# Patient Record
Sex: Female | Born: 1965
Health system: Southern US, Community
[De-identification: ages and names within clinical notes are randomized; demographics above are authoritative.]

## PROBLEM LIST (undated history)

## (undated) DIAGNOSIS — M2550 Pain in unspecified joint: Secondary | ICD-10-CM

## (undated) DIAGNOSIS — I739 Peripheral vascular disease, unspecified: Secondary | ICD-10-CM

## (undated) DIAGNOSIS — E669 Obesity, unspecified: Secondary | ICD-10-CM

## (undated) DIAGNOSIS — I480 Paroxysmal atrial fibrillation: Secondary | ICD-10-CM

## (undated) DIAGNOSIS — R002 Palpitations: Secondary | ICD-10-CM

## (undated) DIAGNOSIS — O24410 Gestational diabetes mellitus in pregnancy, diet controlled: Secondary | ICD-10-CM

## (undated) DIAGNOSIS — I10 Essential (primary) hypertension: Secondary | ICD-10-CM

## (undated) DIAGNOSIS — E559 Vitamin D deficiency, unspecified: Secondary | ICD-10-CM

## (undated) DIAGNOSIS — D649 Anemia, unspecified: Secondary | ICD-10-CM

## (undated) DIAGNOSIS — Z8619 Personal history of other infectious and parasitic diseases: Secondary | ICD-10-CM

## (undated) DIAGNOSIS — M1990 Unspecified osteoarthritis, unspecified site: Secondary | ICD-10-CM

## (undated) DIAGNOSIS — I471 Supraventricular tachycardia, unspecified: Secondary | ICD-10-CM

## (undated) DIAGNOSIS — F329 Major depressive disorder, single episode, unspecified: Secondary | ICD-10-CM

## (undated) DIAGNOSIS — I499 Cardiac arrhythmia, unspecified: Secondary | ICD-10-CM

## (undated) DIAGNOSIS — Z8632 Personal history of gestational diabetes: Secondary | ICD-10-CM

## (undated) DIAGNOSIS — R7303 Prediabetes: Secondary | ICD-10-CM

## (undated) DIAGNOSIS — E785 Hyperlipidemia, unspecified: Secondary | ICD-10-CM

## (undated) DIAGNOSIS — R5383 Other fatigue: Secondary | ICD-10-CM

## (undated) DIAGNOSIS — K219 Gastro-esophageal reflux disease without esophagitis: Secondary | ICD-10-CM

## (undated) DIAGNOSIS — G4733 Obstructive sleep apnea (adult) (pediatric): Secondary | ICD-10-CM

## (undated) DIAGNOSIS — G4730 Sleep apnea, unspecified: Secondary | ICD-10-CM

## (undated) DIAGNOSIS — Z0000 Encounter for general adult medical examination without abnormal findings: Principal | ICD-10-CM

## (undated) DIAGNOSIS — B019 Varicella without complication: Secondary | ICD-10-CM

## (undated) DIAGNOSIS — R011 Cardiac murmur, unspecified: Secondary | ICD-10-CM

## (undated) DIAGNOSIS — N189 Chronic kidney disease, unspecified: Secondary | ICD-10-CM

## (undated) DIAGNOSIS — G709 Myoneural disorder, unspecified: Secondary | ICD-10-CM

## (undated) DIAGNOSIS — I4891 Unspecified atrial fibrillation: Secondary | ICD-10-CM

## (undated) HISTORY — DX: Hyperlipidemia, unspecified: E78.5

## (undated) HISTORY — DX: Cardiac arrhythmia, unspecified: I49.9

## (undated) HISTORY — DX: Palpitations: R00.2

## (undated) HISTORY — DX: Personal history of gestational diabetes: Z86.32

## (undated) HISTORY — DX: Gestational diabetes mellitus in pregnancy, diet controlled: O24.410

## (undated) HISTORY — DX: Obesity, unspecified: E66.9

## (undated) HISTORY — DX: Unspecified osteoarthritis, unspecified site: M19.90

## (undated) HISTORY — DX: Personal history of other infectious and parasitic diseases: Z86.19

## (undated) HISTORY — DX: Cardiac murmur, unspecified: R01.1

## (undated) HISTORY — DX: Prediabetes: R73.03

## (undated) HISTORY — DX: Gastro-esophageal reflux disease without esophagitis: K21.9

## (undated) HISTORY — DX: Major depressive disorder, single episode, unspecified: F32.9

## (undated) HISTORY — DX: Anemia, unspecified: D64.9

## (undated) HISTORY — DX: Encounter for general adult medical examination without abnormal findings: Z00.00

## (undated) HISTORY — DX: Sleep apnea, unspecified: G47.30

## (undated) HISTORY — PX: PALATE / UVULA BIOPSY / EXCISION: SUR128

## (undated) HISTORY — DX: Other fatigue: R53.83

## (undated) HISTORY — DX: Pain in unspecified joint: M25.50

## (undated) HISTORY — DX: Unspecified atrial fibrillation: I48.91

## (undated) HISTORY — PX: ADENOIDECTOMY: SHX5191

## (undated) HISTORY — DX: Vitamin D deficiency, unspecified: E55.9

## (undated) HISTORY — DX: Varicella without complication: B01.9

## (undated) HISTORY — PX: TONSILLECTOMY: SUR1361

## (undated) HISTORY — DX: Obstructive sleep apnea (adult) (pediatric): G47.33

## (undated) HISTORY — DX: Paroxysmal atrial fibrillation: I48.0

## (undated) HISTORY — PX: ENDOVENOUS ABLATION SAPHENOUS VEIN W/ LASER: SUR449

## (undated) HISTORY — PX: WISDOM TOOTH EXTRACTION: SHX21

---

## 1999-07-20 ENCOUNTER — Other Ambulatory Visit: Admission: RE | Admit: 1999-07-20 | Discharge: 1999-07-20 | Payer: Self-pay | Admitting: Family Medicine

## 2000-04-24 ENCOUNTER — Encounter: Admission: RE | Admit: 2000-04-24 | Discharge: 2000-07-23 | Payer: Self-pay | Admitting: Obstetrics & Gynecology

## 2000-06-18 ENCOUNTER — Inpatient Hospital Stay (HOSPITAL_COMMUNITY): Admission: AD | Admit: 2000-06-18 | Discharge: 2000-06-20 | Payer: Self-pay | Admitting: Obstetrics and Gynecology

## 2000-06-18 ENCOUNTER — Encounter (INDEPENDENT_AMBULATORY_CARE_PROVIDER_SITE_OTHER): Payer: Self-pay | Admitting: Specialist

## 2000-07-17 ENCOUNTER — Other Ambulatory Visit: Admission: RE | Admit: 2000-07-17 | Discharge: 2000-07-17 | Payer: Self-pay | Admitting: Obstetrics and Gynecology

## 2004-08-03 ENCOUNTER — Emergency Department (HOSPITAL_COMMUNITY): Admission: EM | Admit: 2004-08-03 | Discharge: 2004-08-03 | Payer: Self-pay | Admitting: Family Medicine

## 2004-10-04 ENCOUNTER — Ambulatory Visit (HOSPITAL_BASED_OUTPATIENT_CLINIC_OR_DEPARTMENT_OTHER): Admission: RE | Admit: 2004-10-04 | Discharge: 2004-10-04 | Payer: Self-pay | Admitting: Internal Medicine

## 2006-05-06 ENCOUNTER — Emergency Department (HOSPITAL_COMMUNITY): Admission: EM | Admit: 2006-05-06 | Discharge: 2006-05-06 | Payer: Self-pay | Admitting: Emergency Medicine

## 2006-05-22 ENCOUNTER — Ambulatory Visit: Payer: Self-pay | Admitting: Internal Medicine

## 2006-10-28 ENCOUNTER — Emergency Department (HOSPITAL_COMMUNITY): Admission: EM | Admit: 2006-10-28 | Discharge: 2006-10-28 | Payer: Self-pay | Admitting: Family Medicine

## 2008-09-07 ENCOUNTER — Emergency Department (HOSPITAL_COMMUNITY): Admission: EM | Admit: 2008-09-07 | Discharge: 2008-09-07 | Payer: Self-pay | Admitting: Emergency Medicine

## 2008-09-21 ENCOUNTER — Ambulatory Visit: Payer: Self-pay | Admitting: Internal Medicine

## 2009-07-19 ENCOUNTER — Telehealth: Payer: Self-pay | Admitting: Internal Medicine

## 2009-10-12 ENCOUNTER — Telehealth: Payer: Self-pay | Admitting: Internal Medicine

## 2009-12-01 ENCOUNTER — Ambulatory Visit (HOSPITAL_COMMUNITY): Admission: RE | Admit: 2009-12-01 | Discharge: 2009-12-01 | Payer: Self-pay | Admitting: General Surgery

## 2009-12-22 ENCOUNTER — Encounter (INDEPENDENT_AMBULATORY_CARE_PROVIDER_SITE_OTHER): Payer: Self-pay | Admitting: Otolaryngology

## 2009-12-22 ENCOUNTER — Ambulatory Visit (HOSPITAL_COMMUNITY): Admission: RE | Admit: 2009-12-22 | Discharge: 2009-12-23 | Payer: Self-pay | Admitting: Otolaryngology

## 2010-09-08 ENCOUNTER — Ambulatory Visit: Payer: Self-pay | Admitting: Diagnostic Radiology

## 2010-09-08 ENCOUNTER — Emergency Department (HOSPITAL_BASED_OUTPATIENT_CLINIC_OR_DEPARTMENT_OTHER): Admission: EM | Admit: 2010-09-08 | Discharge: 2010-09-08 | Payer: Self-pay | Admitting: Emergency Medicine

## 2011-02-05 LAB — URINALYSIS, ROUTINE W REFLEX MICROSCOPIC
Bilirubin Urine: NEGATIVE
Ketones, ur: NEGATIVE mg/dL
Nitrite: NEGATIVE
Protein, ur: NEGATIVE mg/dL
Specific Gravity, Urine: 1.015 (ref 1.005–1.030)
Urobilinogen, UA: 1 mg/dL (ref 0.0–1.0)

## 2011-02-05 LAB — CBC
HCT: 33.8 % — ABNORMAL LOW (ref 36.0–46.0)
Hemoglobin: 11.3 g/dL — ABNORMAL LOW (ref 12.0–15.0)
Hemoglobin: 11.6 g/dL — ABNORMAL LOW (ref 12.0–15.0)
MCHC: 33.5 g/dL (ref 30.0–36.0)
MCV: 87.4 fL (ref 78.0–100.0)
MCV: 88.9 fL (ref 78.0–100.0)
Platelets: 294 10*3/uL (ref 150–400)
RBC: 3.92 MIL/uL (ref 3.87–5.11)
RDW: 13.5 % (ref 11.5–15.5)
WBC: 9.2 10*3/uL (ref 4.0–10.5)

## 2011-02-05 LAB — DIFFERENTIAL
Basophils Absolute: 0 10*3/uL (ref 0.0–0.1)
Basophils Relative: 1 % (ref 0–1)
Eosinophils Absolute: 0.2 10*3/uL (ref 0.0–0.7)
Eosinophils Relative: 3 % (ref 0–5)
Lymphocytes Relative: 29 % (ref 12–46)
Monocytes Absolute: 0.5 10*3/uL (ref 0.1–1.0)

## 2011-02-05 LAB — BASIC METABOLIC PANEL
BUN: 12 mg/dL (ref 6–23)
CO2: 31 mEq/L (ref 19–32)
CO2: 27 mEq/L (ref 19–32)
Calcium: 9.1 mg/dL (ref 8.4–10.5)
Chloride: 105 mEq/L (ref 96–112)
Creatinine, Ser: 0.66 mg/dL (ref 0.4–1.2)
GFR calc Af Amer: >60 mL/min (ref >60)
GFR calc non Af Amer: >60 mL/min (ref >60)
Glucose, Bld: 94 mg/dL (ref 70–99)
Potassium: 3.3 mEq/L — ABNORMAL LOW (ref 3.5–5.1)
Sodium: 139 mEq/L (ref 135–145)
Sodium: 138 mEq/L (ref 135–145)

## 2011-02-05 LAB — URINE MICROSCOPIC-ADD ON

## 2011-02-05 LAB — HCG, SERUM, QUALITATIVE: Preg, Serum: NEGATIVE

## 2011-02-08 LAB — MRSA PCR SCREENING: MRSA by PCR: NEGATIVE

## 2011-04-04 NOTE — Letter (Signed)
September 21, 2008    Theressa Millard, M.D.  [address] [place], [place] [address]   RE:  KINSHASA, THROCKMORTON  MRN:  [id number]  /  DOB:  03-31-1966   Dear Fayrene Fearing,   It was a pleasure seeing Sammie Schermerhorn again for her SVT.  She had been  quiescent since we had seen her couple years ago until August.  Since  that time, she said 3 episodes of tachycardia.  The first broke  spontaneously, the second had required intravenous diltiazem.  These  episodes are fog positive but diuretic negative.   She is also concerned about her blood pressure; you recently increased  her Diovan HCT.   She does have a history as you know of obstructive sleep apnea, for  which she uses a CPAP and has not been adjusted that she recalls in last  3 years or so.   MEDICATIONS:  Currently include  Diovan HCT 320/12.5 as well Celexa 40.   PHYSICAL EXAMINATION:  Her blood pressure was 138/84.  Her pulse was 72,  her weight was 249, which is up 10 pounds in the last 2 years.  Her  lungs were clear.  Her heart sounds were regular.  Extremities had no  edema.   Electrocardiogram dated today demonstrated sinus rhythm at 85 with  intervals of 0.16/0.09/0.39, the axis was 24 degrees.   Electrocardiogram of her tachycardia obtained September 07, 2008  demonstrated a narrow QRS tachycardia with an R prime in lead V1.  The  cycle length was 320 milliseconds or so.   IMPRESSION:  1. Recurrent supraventricular tachycardia, probably atrioventricular      nodal reentry.  2. Hypertension.  3. Obstructive sleep apnea.  4. Obesity.   Ms. Khurana has continued to have episodes of SVT at an increasing  frequency.  We discussed treatment options again including catheter  ablation, to which she is averse because of concerns about heart block  and pacemaker implantation.  She is also concerned about her blood  pressure in the context of her weight.  We discussed potential  contributions of her sleep apnea to her  hypertension.  She mentioned  that had been some years since it had been titrated.  I told her, I  would discuss with you about getting her referred perhaps back to Dr.  Jetty Duhamel for reassessment of the appropriate pressures settings.  In addition, because of the increasing frequency of her SVT, I have  given her prescriptions for 4 different medications to see, which of  these she tolerate the best, if any, these include diltiazem 120,  verapamil 120, atenolol 50, and Inderal LA 60.  She is to take them in  random order and let us know, which of them was the best tolerated and  we will maintain that and see if it has an impact on decreasing the  frequency of her SVT as well as improving her blood pressure control.   Thanks very much for allowing [place] to participate in her care.  If there  is anything further I can do, please do not hesitate to contact me.    Sincerely,      Duke Salvia, MD, [facility]  Electronically Signed    SCK/MedQ  DD: 09/21/2008  DT: 09/22/2008  Job #: [id number]

## 2011-04-07 NOTE — Procedures (Signed)
Misty Mahoney, MARION NO.:  [id number]   MEDICAL RECORD NO.:  [id number]          PATIENT TYPE:  OUT   LOCATION:  SLEEP CENTER                 FACILITY[facility]  [facility]   PHYSICIAN:  Clinton D. Maple Hudson, M.D. DATE OF BIRTH:  [birth year]   DATE OF STUDY:  10/04/2004                              NOCTURNAL POLYSOMNOGRAM   REFERRING PHYSICIAN:  Theressa Millard, MD   INDICATION FOR STUDY:  Hypersomnia with sleep apnea.  Epworth Sleepiness  Score 6/24, BMI 36, weight 238 pounds.   SLEEP ARCHITECTURE:  Total sleep time 379 minutes with sleep efficiency 83%.  Stage 1 was 6%, stage 2 66%, stages 3 and 4 5%.  REM was 24% of total sleep  time.  Sleep efficiency 83%, sleep latency 30 minutes, REM latency 95  minutes, awake after sleep onset of 49 minutes, arousal index elevated at  93.   RESPIRATORY DATA:  Split study protocol.  RDI 21.4 per hour indicating  moderate obstructive sleep apnea/hypopnea syndrome before CPAP.  This  included 1 obstructive apnea and 51 hypopneas before CPAP.  Events  were  clearly positional, limited to supine sleeping position.  REM RDI 22 per  hour.  CPAP was titrated to 10 CWP, RDI 0 per hour.  A small Respironics  comfort gel mask was used with heated humidifier.   OXYGEN DATA:  Very loud snoring with oxygen desaturation to a nadir of 88%  before CPAP.  After CPAP titration, saturation held 96% on room air.   CARDIAC DATA:  Normal sinus rhythm.   MOVEMENT/PARASOMNIA:  A total of 34 limb jerks were recorded of which 16  were associated with arousal or awakening, for a periodic limb movement with  arousal index of 2.5 per hour, which is mildly increased.   IMPRESSION/RECOMMENDATION:  Moderate obstructive sleep apnea/hypopnea  syndrome, RDI 21.4 per hour with desaturation to 88%.  CPAP titration to 10  CWP, RDI 0 per hour using a small Respironics comfort gel mask with heated  humidifier.     Clinton D. Maple Hudson, M.D.  Diplomate, American Board   CDY/MEDQ  D:  10/09/2004 12:15:06  T:  10/09/2004 17:01:56  Job:  [id number]

## 2011-08-22 LAB — POCT CARDIAC MARKERS
CKMB, poc: 1.6
Myoglobin, poc: 61.8

## 2011-08-22 LAB — POCT I-STAT, CHEM 8
BUN: 15
Chloride: 107
Creatinine, Ser: 0.7
Sodium: 139

## 2011-11-07 ENCOUNTER — Other Ambulatory Visit: Payer: Self-pay

## 2011-11-21 HISTORY — PX: CARDIAC ELECTROPHYSIOLOGY STUDY AND ABLATION: SHX1294

## 2011-11-21 LAB — HM PAP SMEAR

## 2011-11-21 LAB — HM MAMMOGRAPHY

## 2012-03-25 ENCOUNTER — Emergency Department (HOSPITAL_COMMUNITY): Payer: 59

## 2012-03-25 ENCOUNTER — Emergency Department (HOSPITAL_COMMUNITY)
Admission: EM | Admit: 2012-03-25 | Discharge: 2012-03-25 | Disposition: A | Discharge status: Home / Self Care | Payer: 59 | Attending: Emergency Medicine | Admitting: Emergency Medicine

## 2012-03-25 ENCOUNTER — Encounter (HOSPITAL_COMMUNITY): Payer: Self-pay | Admitting: Cardiology

## 2012-03-25 DIAGNOSIS — I472 Ventricular tachycardia: Secondary | ICD-10-CM

## 2012-03-25 DIAGNOSIS — I498 Other specified cardiac arrhythmias: Secondary | ICD-10-CM | POA: Insufficient documentation

## 2012-03-25 DIAGNOSIS — R0602 Shortness of breath: Secondary | ICD-10-CM | POA: Insufficient documentation

## 2012-03-25 DIAGNOSIS — I10 Essential (primary) hypertension: Secondary | ICD-10-CM | POA: Insufficient documentation

## 2012-03-25 DIAGNOSIS — I471 Supraventricular tachycardia: Secondary | ICD-10-CM

## 2012-03-25 DIAGNOSIS — Z79899 Other long term (current) drug therapy: Secondary | ICD-10-CM | POA: Insufficient documentation

## 2012-03-25 HISTORY — DX: Supraventricular tachycardia: I47.1

## 2012-03-25 HISTORY — DX: Essential (primary) hypertension: I10

## 2012-03-25 HISTORY — DX: Supraventricular tachycardia, unspecified: I47.10

## 2012-03-25 LAB — POCT I-STAT, CHEM 8
Chloride: 106 mEq/L (ref 96–112)
HCT: 38 % (ref 36.0–46.0)
Hemoglobin: 12.9 g/dL (ref 12.0–15.0)
Potassium: 3.5 mEq/L (ref 3.5–5.1)
Sodium: 142 mEq/L (ref 135–145)

## 2012-03-25 LAB — POCT I-STAT TROPONIN I: Troponin i, poc: 0.01 ng/mL (ref 0.00–0.08)

## 2012-03-25 MED ORDER — ADENOSINE 6 MG/2ML IV SOLN
INTRAVENOUS | Status: AC
  Filled 2012-03-25: qty 6

## 2012-03-25 NOTE — ED Provider Notes (Signed)
  Patient has been evaluated by cardiology and deemed stable for discharge. Will f/u as outpatient. They have given her new prescriptions and she is comfortable with this plan. See their note for full details.  Forbes Cellar, MD 03/25/12 [phone]

## 2012-03-25 NOTE — ED Notes (Signed)
Pt placed on cardiac monitor and on zole pads at this time. Denies any pain at this time. No distress noted.

## 2012-03-25 NOTE — ED Notes (Signed)
Pt reports that she has a hx of SVT and normally converted with 10mg  of cardizem. Reports that she has ben having symptoms all weekend. Reports that she is not having chest pain at this time or SOB. No distress noted. Reports that she sees Dr. Alberteen Spindle for cardiology.

## 2012-03-25 NOTE — ED Provider Notes (Signed)
History     CSN: [id number]  Arrival date & time 03/25/12  1324   First MD Initiated Contact with Patient 03/25/12 1340      Chief Complaint  Patient presents with  . SVT      HPI Pt reports that she has a hx of SVT and normally converted with 10mg  of cardizem. Reports that she has ben having symptoms all weekend. Reports that she is not having chest pain at this time or SOB. No distress noted. Reports that she sees Dr. Tollie Pizza for cardiology.   Past Medical History  Diagnosis Date  . SVT (supraventricular tachycardia)   . Hypertension     History reviewed. No pertinent past surgical history.  History reviewed. No pertinent family history.  History  Substance Use Topics  . Smoking status: Not on file  . Smokeless tobacco: Not on file  . Alcohol Use:     OB History    Grav Para Term Preterm Abortions TAB SAB Ect Mult Living                  Review of Systems  All other systems reviewed and are negative.    Allergies  Review of patient's allergies indicates no known allergies.  Home Medications   Current Outpatient Rx  Name Route Sig Dispense Refill  . ACETAMINOPHEN 325 MG PO TABS Oral Take 650 mg by mouth every 6 (six) hours as needed. For pain    . DESVENLAFAXINE SUCCINATE ER 50 MG PO TB24 Oral Take 25 mg by mouth daily.    Marland Kitchen DILTIAZEM HCL ER COATED BEADS 120 MG PO CP24 Oral Take 120 mg by mouth daily.    . IBUPROFEN 200 MG PO TABS Oral Take 200 mg by mouth every 6 (six) hours as needed. For pain    . VALSARTAN-HYDROCHLOROTHIAZIDE 160-12.5 MG PO TABS Oral Take 1 tablet by mouth daily.      BP 154/85  Pulse 103  Temp(Src) 98 F (36.7 C) (Oral)  Resp 15  SpO2 100%  Physical Exam  Nursing note and vitals reviewed. Constitutional: She is oriented to person, place, and time. She appears well-developed and well-nourished. No distress.  HENT:  Head: Normocephalic and atraumatic.  Eyes: Pupils are equal, round, and reactive to light.  Neck: Normal range  of motion.  Cardiovascular: Normal rate and intact distal pulses.        Initial EKG showed what appeared to be an SVT with a rate of around, 160  Subsequent 12-lead showed a sinus tach at rate 1:30 otherwise unremarkable and the latest 12-lead looks like sinus rhythm with rates dropping down to 86 unremarkable otherwise.  Pulmonary/Chest: No respiratory distress.  Abdominal: Normal appearance. She exhibits no distension. There is no tenderness. There is no rebound.  Musculoskeletal: Normal range of motion.  Neurological: She is alert and oriented to person, place, and time. No cranial nerve deficit.  Skin: Skin is warm and dry. No rash noted.  Psychiatric: She has a normal mood and affect. Her behavior is normal.    ED Course  Procedures (including critical care time)   I did speak with cardiology who will evaluate the patient in the emergency department to make recommendations.    Labs Reviewed  POCT I-STAT, CHEM 8 - Abnormal; Notable for the following:    Glucose, Bld 111 (*)    All other components within normal limits  POCT I-STAT TROPONIN I  LAB REPORT - SCANNED   Dg Chest Portable 1 View  03/25/2012  *RADIOLOGY REPORT*  Clinical Data: Occasions.  Short of breath.  Chest pain.  PORTABLE CHEST - 1 VIEW  Comparison: 11/29/2009.  Findings:  Cardiopericardial silhouette within normal limits. Mediastinal contours normal. Trachea midline.  No airspace disease or effusion. Apical lordotic projection. Monitoring leads are projected over the chest.  IMPRESSION: No active cardiopulmonary disease.  Original Report Authenticated By: Andreas Newport, M.D.     1. SVT (supraventricular tachycardia)               Nelia Shi, MD 03/27/12 [phone]

## 2012-03-25 NOTE — Consult Note (Signed)
ELECTROPHYSIOLOGY CONSULT NOTE  Patient ID: Misty Mahoney MRN: [id number], DOB/AGE: 1965-12-21   Admit date: 03/25/2012 Date of Consult: 03/25/2012  Primary Cardiologist: Dr. Sherryl Manges Reason for Consultation: SVT  History of Present Illness Misty Mahoney is a pleasant 46 year old woman with a history of PSVT, diagnosed 5 years ago. She has done well on medical therapy and takes diltiazem once daily. She states she has approximately one episode per month which is usually brief and well tolerated. She is usually able to terminate the rhythm on her own with Valsalva manuvers. She reports increased emotional stress recently and was out of town at [employer] with friends this past weekend. She reports her diet has "not been that great" and her caffeine intake increased over the weekend. She reports increasing palpitations x 1 week which she describes as "very fast and occasionally irregular" with no other accompanying symptoms. She denies chest pain or shortness of breath. She has never experienced syncope. However, today she experienced near-syncope which is new for her, prompting her to seek medical attention. Upon arrival in the ER, she was found to have a narrow complex tachycardia at 218 bpm, most consistent with SVT. She remained hemodynamically stable. She converted to NSR without any intervention while in the ER. Currently, she feels like her usual self and has no complaints.   Past Medical History  Diagnosis Date  . SVT (supraventricular tachycardia)   . Hypertension     Past Surgical History None   Allergies/Intolerances No Known Allergies  Home Medications Medication List  As of 03/25/2012  3:25 PM     acetaminophen 325 MG tablet   Commonly known as: TYLENOL   Take 650 mg by mouth every 6 (six) hours as needed. For pain      desvenlafaxine 50 MG 24 hr tablet   Commonly known as: PRISTIQ   Take 25 mg by mouth daily.      diltiazem 120 MG 24 hr capsule   Commonly known as:  CARDIZEM CD   Take 120 mg by mouth daily.      ibuprofen 200 MG tablet   Commonly known as: ADVIL,MOTRIN   Take 200 mg by mouth every 6 (six) hours as needed. For pain      valsartan-hydrochlorothiazide 160-12.5 MG per tablet   Commonly known as: DIOVAN-HCT   Take 1 tablet by mouth daily.      Family History Sister with SVT and AF; both parents with HTN  Social History Social History  . Marital Status: Legally Separated    Spouse Name: N/A    Number of Children: N/A  . Years of Education: N/A   Occupational History  . Nurse, works for [employer] History Main Topics  . Smoking status: Not a smoker  . Smokeless tobacco: No  . Alcohol Use: Rarely  . Drug Use: Denies   Review of Systems General:  No chills, fever, night sweats or weight changes.  Cardiovascular:  No chest pain, dyspnea on exertion, edema, orthopnea, palpitations, paroxysmal nocturnal dyspnea. Dermatological: No rash, lesions/masses Respiratory: No cough, dyspnea Urologic: No hematuria, dysuria Abdominal:   No nausea, vomiting, diarrhea, bright red blood per rectum, melena, or hematemesis Neurologic:  No visual changes, wkns, changes in mental status. All other systems reviewed and are otherwise negative except as noted above.  Physical Exam Blood pressure 154/85, pulse 103, temperature 98 F (36.7 C), temperature source Oral, resp. rate 15, SpO2 100.00%.  General: Well developed, well appearing 45  year old female, in no acute distress. HEENT: Normocephalic, atraumatic. EOMs intact. Sclera nonicteric. Oropharynx clear.  Neck: Supple without bruits. No JVD. Lungs:  Respirations regular and unlabored, CTA bilaterally. No wheezes, rales or rhonchi. Heart: RRR. S1, S2 present. No murmurs, rub, S3 or S4. Abdomen: Soft, non-tender, non-distended. BS present x 4 quadrants. No hepatosplenomegaly.  Extremities: No clubbing, cyanosis or edema. DP/PT/Radials 2+ and equal bilaterally. Psych: Normal  affect. Neuro: Alert and oriented X 3. Moves all extremities spontaneously.   Labs Lab Results  Component Value Date   WBC 9.2 12/20/2009   HGB 12.9 03/25/2012   HCT 38.0 03/25/2012   MCV 88.9 12/20/2009   PLT 279 12/20/2009     Lab 03/25/12 1403  NA 142  K 3.5  CL 106  CO2 --  BUN 17  CREATININE 0.60  CALCIUM --  PROT --  BILITOT --  ALKPHOS --  ALT --  AST --  GLUCOSE 111*   No components found with this basename: MAGNESIUM No components found with this basename: POCBNP:3 No results found for this basename: TSH,T4TOTAL,FREET3,T3FREE,THYROIDAB in the last 72 hours   Radiology/Studies Dg Chest Portable 1 View  03/25/2012  *RADIOLOGY REPORT*  Clinical Data: Occasions.  Short of breath.  Chest pain.  PORTABLE CHEST - 1 VIEW  Comparison: 11/29/2009.  Findings:  Cardiopericardial silhouette within normal limits. Mediastinal contours normal. Trachea midline.  No airspace disease or effusion. Apical lordotic projection. Monitoring leads are projected over the chest.  IMPRESSION: No active cardiopulmonary disease.  Original Report Authenticated By: Andreas Newport, M.D.   12-lead ECG  1st - on arrival - shows narrow complex tachycardia at 218 bpm, most consistent with SVT, probably AVNRT 2nd - post conversion to SR - Sinus tachycardia at 130 bpm with normal intervals and no ST-T wave abnormalities; no evidence of pre-excitation or Delta wave   Assessment and Plan 1.  PSVT, probable AVNRT - patient is hemodynamically stable and has converted to NSR without any intervention;    Signed, EDMISTEN, BROOKE O., PA-C 03/25/2012, 3:16 PM   Patient seen, examined. Available data reviewed. Agree with findings, assessment, and plan as outlined by Rick Duff, PA-C. EKG tracings reviewed - agree this looks like AVNRT. She is back in sinus. She has no major comorbid conditions, and agree that increased caffeine, poor sleep, have contributed to worsening symptoms. Will increase cardizem CD to  180 mg daily and give Cardizem 60 mg short-acting prep for prn use. Otherwise advised to push fluids, reduce caffeine. Follow-up with Dr Graciela Husbands as an outpatient (this will be arranged).  Tonny Bollman, M.D. 03/25/2012 4:27 PM

## 2012-03-25 NOTE — Discharge Instructions (Signed)
Supraventricular Tachycardia Supraventricular tachycardia (SVT) is an abnormal heart rhythm (arrhythmia) that causes the heart to beat very fast (tachycardia). This kind of fast heartbeat originates in the upper chambers of the heart (atria). SVT can cause the heart to beat greater than 100 beats per minute. SVT can have a rapid burst of heartbeats. This can start and stop suddenly without warning and is called nonsustained. SVT can also be sustained, in which the heart beats at a continuous fast rate.  CAUSES  There can be different causes of SVT. Some of these include:  Heart valve problems such as mitral valve prolapse.   An enlarged heart (hypertrophic cardiomyopathy).   Congenital heart problems.   Heart inflammation (pericarditis).   Hyperthyroidism.   Low potassium or magnesium levels.   Caffeine.   Drug use such as cocaine, methamphetamines, or stimulants.   Some over-the-counter medicines such as:   Decongestants.   Diet medicines.   Herbal medicines.  SYMPTOMS  Symptoms of SVT can vary. Symptoms depend on whether the SVT is sustained or nonsustained. You may experience:  No symptoms (asymptomatic).   An awareness of your heart beating rapidly (palpitations).   Shortness of breath.   Chest pain or pressure.  If your blood pressure drops because of the SVT, you may experience:  Fainting or near fainting.   Weakness.   Dizziness.  DIAGNOSIS  Different tests can be performed to diagnose SVT, such as:  An electrocardiogram (EKG). This is a painless test that records the electrical activity of your heart.   Holter monitor. This is a 24 hour recording of your heart rhythm. You will be given a diary. Write down all symptoms that you have and what you were doing at the time you experienced symptoms.   Arrhythmia monitor. This is a small device that your wear for several weeks. It records the heart rhythm when you have symptoms.   Echocardiogram. This is an  imaging test to help detect abnormal heart structure such as congenital abnormalities, heart valve problems, or heart enlargement.   Stress test. This test can help determine if the SVT is related to exercise.   Electrophysiology study (EPS). This is a procedure that evaluates your heart's electrical system and can help your caregiver find the cause of your SVT.  TREATMENT  Treatment of SVT depends on the symptoms, how often it recurs, and whether there are any underlying heart problems.   If symptoms are rare and no other cardiac disease is present, no treatment may be needed.   Blood work may be done to check potassium, magnesium, and thyroid hormone levels to see if they are abnormal. If these levels are abnormal, treatment to correct the problems will occur.  Medicines Your caregiver may use oral medicines to treat SVT. These medicines are given for long-term control of SVT. Medicines may be used alone or in combination with other treatments. These medicines work to slow nerve impulses in the heart muscle. These medicines can also be used to treat high blood pressure. Some of these medicines may include:  Calcium channel blockers.   Beta blockers.   Digoxin.  Nonsurgical procedures Nonsurgical techniques may be used if oral medicines do not work. Some examples include:  Cardioversion. This technique uses either drugs or an electrical shock to restore a normal heart rhythm.   Cardioversion drugs may be given through an intravenous (IV) line to help "reset" the heart rhythm.   In electrical cardioversion, the caregiver shocks your heart to stop its   beat for a split second. This helps to reset the heart to a normal rhythm.   Ablation. This procedure is done under mild sedation. High frequency radio wave energy is used to destroy the area of heart tissue responsible for the SVT.  HOME CARE INSTRUCTIONS   Do not smoke.   Only take medicines prescribed by your caregiver. Check with  your caregiver before using over-the-counter medicines.   Check with your caregiver about how much alcohol and caffeine (coffee, tea, colas, or chocolate) you may have.   It is very important to keep all follow-up referrals and appointments in order to properly manage this problem.  SEEK IMMEDIATE MEDICAL CARE IF:  You have dizziness.   You faint or nearly faint.   You have shortness of breath.   You have chest pain or pressure.   You have sudden nausea or vomiting.   You have profuse sweating.   You are concerned about how long your symptoms last.   You are concerned about the frequency of your SVT episodes.  If you have the above symptoms, call your local emergency services (911 in [place] immediately. Do not drive yourself to the hospital. MAKE SURE YOU:   Understand these instructions.   Will watch your condition.   Will get help right away if you are not doing well or get worse.  Document Released: 11/06/2005 Document Revised: 10/26/2011 Document Reviewed: 02/18/2009 ExitCare Patient Information 2012 [employer].  RESOURCE GUIDE  Dental Problems  Patients with Medicaid: [facility]                     [facility] [address]                                           [address] Phone:  [phone]                                                   Phone:  [phone]  If unable to pay or uninsured, contact:  [facility] or [facility] to become qualified for the adult dental clinic.  Chronic Pain Problems Contact [facility]  [phone] Patients need to be referred by their primary care doctor.  Insufficient Money for Medicine Contact United Way:  call "211" or [facility] 271-5999.  No Primary Care Doctor Call [facility]  832-8000 Other agencies that provide inexpensive medical care    [facility]  832-8035    [facility]  832-7272    [facility]  [phone]    [facility] Clinic  [phone]    [facility]  [phone]    [facility]  272-1050  Psychological Services [facility]  832-9600 [facility]  [phone] [facility]   800 853-5163 (emergency services 641-4993)  Abuse/Neglect [place] Child Abuse Hotline (336) 641-3795 [place] [facility] Abuse Hotline 800-378-5315 (After Hours)  Emergency Shelter  [facility] (336) 271-5985  [facility] (336) 275-9566 [facility] (704) 372-4663  MRSA Hotline #:   832-7006    [place] [facility]  [facility] [place]   [facility]  [employer]                           [facility] [address] [place]                     [address]         [address]  [place]                                               [place]                              [place] Phone:  [phone]                                  Phone:  [phone]                   Phone:  [phone]  [facility] Phone:  [phone]  [place] Child Abuse Hotline (336) 342-1394 (336) 342-3537 (After Hours)  

## 2012-04-09 ENCOUNTER — Telehealth: Payer: Self-pay | Admitting: *Deleted

## 2012-04-09 NOTE — Telephone Encounter (Signed)
Lorne Skeens C More Detail >>      Sherrilyn Rist        Sent: Thu Apr 04, 2012  1:32 PM    To: Jefferey Pica, RN        CECIA EGGE    MRN: [id number] DOB: 09-Apr-1966     Pt Work: [phone] Pt Home: [phone]           Message     H, call patient this pm, patient states that she will call me back due to driving. Thanks gesila   ----- Message -----    From: Jefferey Pica, RN    Sent: 03/28/2012   5:25 PM      To: Helyn App,  I think you have a copy of this message, but can you call the patient to set her up with Dr. Graciela Husbands. I didn't look to see when she actually saw him last. ----- Message -----    From: Deliah Boston, RN    Sent: 03/27/2012   4:03 PM      To: Jefferey Pica, RN    ----- Message -----    From: Tonny Bollman, MD    Sent: 03/25/2012   4:25 PM      To: Sissy Hoff, RN  Saw this patient with Nehemiah Settle in the ER today with SVT. She sees Berton Mount but hasn't seen in a few yrs. Can you set her up for follow-up? thx       Forwarded by:     Sherrilyn Rist  Date:  04/04/2012

## 2012-04-09 NOTE — Telephone Encounter (Signed)
Misty Mahoney,call patient today, unable to speak with patient. gesila  ----- Message ----- From: Jefferey Pica, RN Sent: 03/29/2012 3:37 PM To: Sherrilyn Rist  Can you please call this patient and set her up for EPH f/u on 5/30 with Dr. Graciela Husbands.  Thanks ----- Message ----- From: Deliah Boston, RN Sent: 03/27/2012 4:03 PM To: Jefferey Pica, RN    ----- Message ----- From: Tonny Bollman, MD Sent: 03/25/2012 4:25 PM To: Sissy Hoff, RN  Saw this patient with Nehemiah Settle in the ER today with SVT. She sees Berton Mount but hasn't seen in a few yrs. Can you set her up for follow-up? thx            Forwarded by:     Sherrilyn Rist Date: 04/05/2012

## 2012-04-12 ENCOUNTER — Telehealth: Payer: Self-pay | Admitting: Internal Medicine

## 2012-04-12 NOTE — Telephone Encounter (Signed)
New problem:  Scheduler - Marlowe Kays, has made several attempts to reach patient by number provide in her demographics to make an appt to see Dr. Graciela Husbands on 5/30. Message sent to epic to [place] on  5/1. Marlowe Kays did speak to Ms. Corine Shelter regarding her appt unfortunately patient was driving and states she will call the office back to make her an appt . Marlowe Kays call patient today, left message on home voice mail to call office to make appt with Dr. Graciela Husbands on 5/30.

## 2012-05-06 ENCOUNTER — Encounter: Payer: Self-pay | Admitting: Internal Medicine

## 2012-07-18 ENCOUNTER — Encounter: Payer: Self-pay | Admitting: Internal Medicine

## 2012-07-18 ENCOUNTER — Ambulatory Visit (INDEPENDENT_AMBULATORY_CARE_PROVIDER_SITE_OTHER): Payer: 59 | Admitting: Internal Medicine

## 2012-07-18 VITALS — BP 176/89 | HR 88 | Ht 68.0 in | Wt 240.8 lb

## 2012-07-18 DIAGNOSIS — I10 Essential (primary) hypertension: Secondary | ICD-10-CM | POA: Insufficient documentation

## 2012-07-18 DIAGNOSIS — G4730 Sleep apnea, unspecified: Secondary | ICD-10-CM

## 2012-07-18 DIAGNOSIS — I498 Other specified cardiac arrhythmias: Secondary | ICD-10-CM

## 2012-07-18 DIAGNOSIS — R0989 Other specified symptoms and signs involving the circulatory and respiratory systems: Secondary | ICD-10-CM

## 2012-07-18 DIAGNOSIS — I471 Supraventricular tachycardia: Secondary | ICD-10-CM

## 2012-07-18 DIAGNOSIS — G4733 Obstructive sleep apnea (adult) (pediatric): Secondary | ICD-10-CM

## 2012-07-18 DIAGNOSIS — I4891 Unspecified atrial fibrillation: Secondary | ICD-10-CM

## 2012-07-18 MED ORDER — METOPROLOL SUCCINATE ER 25 MG PO TB24: 25.0000 mg | ORAL_TABLET | Freq: Every day | ORAL | Status: DC

## 2012-07-18 MED ORDER — ATENOLOL 25 MG PO TABS: 25.0000 mg | ORAL_TABLET | Freq: Two times a day (BID) | ORAL | Status: DC

## 2012-07-18 NOTE — Patient Instructions (Signed)
Your physician has recommended you make the following change in your medication:  - you are being given prescriptions for 2 different beta blockers to try. You may take them in any order. Do not take more than one at a time. Dr. Graciela Husbands would like you to try each one for a least 2 weeks to see how you tolerate them 1) Atenolol 25 mg one tablet by mouth once daily. 2) Metoprolol succinate 25 mg one tablet by mouth once daily.  Your physician has recommended you have a home sleep study ([facility]).  Your physician has requested that you have an echocardiogram. Echocardiography is a painless test that uses sound waves to create images of your heart. It provides your doctor with information about the size and shape of your heart and how well your heart's chambers and valves are working. This procedure takes approximately one hour. There are no restrictions for this procedure.  Your physician has recommended that you have a SVT ablation. We will arrange for this to be done with Dr. Lewayne Bunting after your testing is completed. Dr. Odessa Fleming nurse, Herbert Seta, or Dr. Lubertha Basque nurse, Tresa Endo, will be in touch with you to set this up. Catheter ablation is a medical procedure used to treat some cardiac arrhythmias (irregular heartbeats). During catheter ablation, a long, thin, flexible tube is put into a blood vessel in your groin (upper thigh), or neck. This tube is called an ablation catheter. It is then guided to your heart through the blood vessel. Radio frequency waves destroy small areas of heart tissue where abnormal heartbeats may cause an arrhythmia to start.

## 2012-07-18 NOTE — Assessment & Plan Note (Signed)
As above.

## 2012-07-18 NOTE — Progress Notes (Signed)
ELECTROPHYSIOLOGY CONSULT NOTE  Patient ID: Misty Mahoney, MRN: [id number], DOB/AGE: Mar 06, 1966 46 y.o. Admit date: (Not on file) Date of Consult: 07/18/2012  Primary Physician: Pcp Not In System Primary Cardiologist: [facility]  Chief Complaint: SVT   HPI Misty Mahoney is a 46 y.o. female  is seen a number of years ago for SVT thought by ECG to be a nodal reentry. At that time multiple different options were discussed and she elected to pursue medical therapy. We have not seen her since. She was seen in the emergency room 3 months ago by Dr. Tonny Bollman and found again to have SVT medical attention for which she has sought because of associated near-syncope which was new; this occurred in the context of termination of which he thought was atrial fibrillation.  She's had documented atrial fibrillation that has emerged out of her SVT. She has not had isolated atrial fibrillation.  Episodes of SVT she has been able to attributed to caffeine, stimulants or colds, alcohol, but some are non-triggered  She has a history of sleep apnea and underwent a UP3 procedure a number of years ago. She has not had a followup sleep study.  Her blood pressure remains poorly controlled.      Past Medical History  Diagnosis Date  . SVT (supraventricular tachycardia)   . Hypertension       Surgical History: No past surgical history on file.   Home Meds: Prior to Admission medications   Medication Sig Start Date End Date Taking? Authorizing Provider  acetaminophen (TYLENOL) 325 MG tablet Take 650 mg by mouth every 6 (six) hours as needed. For pain   Yes Historical Provider, MD  desvenlafaxine (PRISTIQ) 50 MG 24 hr tablet Take 25 mg by mouth daily.   Yes Historical Provider, MD  diltiazem (CARDIZEM CD) 120 MG 24 hr capsule Take 180 mg by mouth daily.    Yes Historical Provider, MD  ibuprofen (ADVIL,MOTRIN) 200 MG tablet Take 200 mg by mouth every 6 (six) hours as needed. For pain   Yes Historical  Provider, MD  valsartan-hydrochlorothiazide (DIOVAN-HCT) 160-12.5 MG per tablet Take 1 tablet by mouth daily.   Yes Historical Provider, MD     Allergies: No Known Allergies  History   Social History  . Marital Status: Legally Separated    Spouse Name: N/A    Number of Children: N/A  . Years of Education: N/A   Occupational History  . Not on file.   Social History Main Topics  . Smoking status: Never Smoker   . Smokeless tobacco: Never Used  . Alcohol Use: Yes     One a month  . Drug Use: No  . Sexually Active: Not on file   Other Topics Concern  . Not on file   Social History Narrative  . No narrative on file     Family history notable for arrhythmia and breast cancer  ROS:  Please see the history of present illness.   she has recently injured foot   All other systems reviewed and negative.    Physical Exam:  Blood pressure 176/89, pulse 88, height 5\' 8"  (1.727 m), weight 240 lb 12.8 oz (109.226 kg). General: Well developed, well nourished obese  female in no acute distress. Head: Normocephalic, atraumatic, sclera non-icteric, no xanthomas, nares are without discharge. Lymph Nodes:  none Back: without scoliosis/kyphosis, no CVA tendersness Neck: Negative for carotid bruits. JVD not elevated. Lungs: Clear bilaterally to auscultation without wheezes, rales, or rhonchi. Breathing is  unlabored. Heart: RRR with S1 S2. No murmur , rubs, or gallops appreciated. Abdomen: Soft, non-tender, non-distended with normoactive bowel sounds. No hepatomegaly. No rebound/guarding. No obvious abdominal masses. Msk:  Strength and tone appear normal for age. Extremities: No clubbing or cyanosis. No edema.  Distal pedal pulses are 2+ and equal bilaterally. Skin: Warm and Dry Neuro: Alert and oriented X 3. CN III-XII intact Grossly normal sensory and motor function . Psych:  Responds to questions appropriately with a normal affect.      Labs: Cardiac Enzymes No results found for  this basename: CKTOTAL:4,CKMB:4,TROPONINI:4 in the last 72 hours CBC Lab Results  Component Value Date   WBC 9.2 12/20/2009   HGB 12.9 03/25/2012   HCT 38.0 03/25/2012   MCV 88.9 12/20/2009   PLT 279 12/20/2009   PROTIME: No results found for this basename: LABPROT:3,INR:3 in the last 72 hours Chemistry No results found for this basename: NA,K,CL,CO2,BUN,CREATININE,CALCIUM,LABALBU,PROT,BILITOT,ALKPHOS,ALT,AST,GLUCOSE in the last 168 hours Lipids No results found for this basename: CHOL, HDL, LDLCALC, TRIG   BNP No results found for this basename: probnp   Miscellaneous No results found for this basename: DDIMER    Radiology/Studies:  No results found.  EKG:  nsr with no delta wave   Assessment and Plan:  Misty Mahoney

## 2012-07-18 NOTE — Assessment & Plan Note (Signed)
Have encouraged her to undertake a sleep study with which she is agreeable

## 2012-07-18 NOTE — Assessment & Plan Note (Addendum)
The patient has had recurrent supraventricular tachycardia which by ECG I previously thought was AV node reentry. She's been quiet for a couple of years on diltiazem but has had increasingly frequent episodes over recent months. More notably of late, she has had episodes where she has transitioned from regular tachycardia 2 atrial fibrillation documented apparently [place] for which she is aware of on multiple occasions just  by palpation. On one occasion which prompted her visit to the emergency room in May there was a presyncopal episode associated with what she described at the end of the atrial fibrillation.  We have discussed treatment options including ongoing medical therapy as well as catheter ablation. Her sister-in-law recently underwent catheter ablation. We discussed the benefits as well as the potential risks including but not limited to death perforation and heart block requiring pacemaker implantation. We discussed further the potential relationship of her SVT to her atrial fibrillation as a causative one of the hopes of eliminating the SVTsubstrate we might help prevent episodes of atrial fibrillation.  This has increasing value as her CHADS2 score of is 1 and her CHADS-VASc score is 2 which would prompt the need for long-term anticoagulation for thromboembolic risk reduction based on the above, she would like to proceed with catheter ablation.  needs the preceding echo and will arrange for her to have a catheter ablation done by Dr. Ladona Ridgel  In the meantime, we'll add a low-dose beta blocker as her heart rate and blood pressure are still quite high

## 2012-07-23 ENCOUNTER — Ambulatory Visit (HOSPITAL_COMMUNITY): Payer: 59 | Attending: Cardiology

## 2012-07-23 DIAGNOSIS — I4891 Unspecified atrial fibrillation: Secondary | ICD-10-CM | POA: Insufficient documentation

## 2012-07-23 DIAGNOSIS — G4733 Obstructive sleep apnea (adult) (pediatric): Secondary | ICD-10-CM | POA: Insufficient documentation

## 2012-07-23 DIAGNOSIS — I10 Essential (primary) hypertension: Secondary | ICD-10-CM | POA: Insufficient documentation

## 2012-07-23 DIAGNOSIS — I471 Supraventricular tachycardia: Secondary | ICD-10-CM

## 2012-07-23 DIAGNOSIS — I080 Rheumatic disorders of both mitral and aortic valves: Secondary | ICD-10-CM | POA: Insufficient documentation

## 2012-07-23 DIAGNOSIS — I379 Nonrheumatic pulmonary valve disorder, unspecified: Secondary | ICD-10-CM | POA: Insufficient documentation

## 2012-07-23 NOTE — Progress Notes (Signed)
Echocardiogram performed.  

## 2012-07-24 ENCOUNTER — Other Ambulatory Visit: Payer: Self-pay | Admitting: Physician Assistant

## 2012-07-24 DIAGNOSIS — D229 Melanocytic nevi, unspecified: Secondary | ICD-10-CM

## 2012-07-24 HISTORY — DX: Melanocytic nevi, unspecified: D22.9

## 2012-08-02 ENCOUNTER — Encounter: Payer: Self-pay | Admitting: Internal Medicine

## 2012-08-13 ENCOUNTER — Other Ambulatory Visit: Payer: Self-pay | Admitting: Internal Medicine

## 2012-08-14 ENCOUNTER — Telehealth: Payer: Self-pay | Admitting: Internal Medicine

## 2012-08-14 NOTE — Telephone Encounter (Signed)
I have not tried to call the patient. This may have been scheduling. I will forward to [facility] as Misty Mahoney said she was handling this.

## 2012-08-14 NOTE — Telephone Encounter (Signed)
PT RTN YOUR CALL/LG

## 2012-08-27 ENCOUNTER — Ambulatory Visit (INDEPENDENT_AMBULATORY_CARE_PROVIDER_SITE_OTHER): Payer: 59 | Admitting: Internal Medicine

## 2012-08-27 ENCOUNTER — Other Ambulatory Visit: Payer: Self-pay | Admitting: *Deleted

## 2012-08-27 ENCOUNTER — Encounter: Payer: Self-pay | Admitting: *Deleted

## 2012-08-27 ENCOUNTER — Encounter: Payer: Self-pay | Admitting: Internal Medicine

## 2012-08-27 VITALS — BP 130/70 | HR 70 | Ht 68.0 in | Wt 238.0 lb

## 2012-08-27 DIAGNOSIS — I498 Other specified cardiac arrhythmias: Secondary | ICD-10-CM

## 2012-08-27 DIAGNOSIS — G4730 Sleep apnea, unspecified: Secondary | ICD-10-CM

## 2012-08-27 DIAGNOSIS — I471 Supraventricular tachycardia: Secondary | ICD-10-CM

## 2012-08-27 DIAGNOSIS — I4891 Unspecified atrial fibrillation: Secondary | ICD-10-CM

## 2012-08-27 NOTE — Assessment & Plan Note (Signed)
I discussed the treatment options with the patient and the risk, goals, benefits, and expectations of catheter ablation of SVT have been discussed with the patient and she wishes to proceed.

## 2012-08-27 NOTE — Progress Notes (Signed)
HPI Misty Mahoney is referred today for consideration of catheter ablation. She had documented SVT initially 7 years ago. She was treated with adenosine. Since then she has been on calcium channel blockers but continued to have episodes of SVT. In addition, the patient will go on to atrial fibrillation from her SVT. She has been treated most recently with Cardizem. Because of persistent symptoms despite medical therapy, she is interested in pursuing catheter ablation.  Current Outpatient Prescriptions  Medication Sig Dispense Refill  . acetaminophen (TYLENOL) 325 MG tablet Take 650 mg by mouth every 6 (six) hours as needed. For pain      . desvenlafaxine (PRISTIQ) 50 MG 24 hr tablet Take 25 mg by mouth daily.      . diltiazem (CARDIZEM CD) 120 MG 24 hr capsule Take 180 mg by mouth daily.       . ibuprofen (ADVIL,MOTRIN) 200 MG tablet Take 200 mg by mouth every 6 (six) hours as needed. For pain      . metoprolol succinate (TOPROL-XL) 25 MG 24 hr tablet TAKE 1 TABLET BY MOUTH DAILY  30 tablet  0  . valsartan-hydrochlorothiazide (DIOVAN-HCT) 160-12.5 MG per tablet Take 1 tablet by mouth daily.         Past Medical History  Diagnosis Date  . SVT (supraventricular tachycardia)   . Hypertension   . Atrial fibrillation   . OSA (obstructive sleep apnea)     ROS:   All systems reviewed and negative except as noted in the HPI.   No past surgical history on file.   No family history on file.   History   Social History  . Marital Status: Legally Separated    Spouse Name: N/A    Number of Children: N/A  . Years of Education: N/A   Occupational History  . Not on file.   Social History Main Topics  . Smoking status: Never Smoker   . Smokeless tobacco: Never Used  . Alcohol Use: Yes     One a month  . Drug Use: No  . Sexually Active: Not on file   Other Topics Concern  . Not on file   Social History Narrative  . No narrative on file     BP 130/70  Pulse 70  Ht 5' 8" (1.727  m)  Wt 238 lb (107.956 kg)  BMI 36.19 kg/m2  Physical Exam:  Well appearing middle-aged woman, NAD HEENT: Unremarkable Neck:  No JVD, no thyromegally Lungs:  Clear with no wheezes, rales, or rhonchi. HEART:  Regular rate rhythm, no murmurs, no rubs, no clicks Abd:  soft, positive bowel sounds, no organomegally, no rebound, no guarding Ext:  2 plus pulses, no edema, no cyanosis, no clubbing Skin:  No rashes no nodules Neuro:  CN II through XII intact, motor grossly intact  EKG Normal sinus rhythm with right atrial enlargement and poor R-wave progression Assess/Plan:  

## 2012-08-27 NOTE — Patient Instructions (Signed)
Your physician has recommended that you have an ablation. Catheter ablation is a medical procedure used to treat some cardiac arrhythmias (irregular heartbeats). During catheter ablation, a long, thin, flexible tube is put into a blood vessel in your groin (upper thigh), or neck. This tube is called an ablation catheter. It is then guided to your heart through the blood vessel. Radio frequency waves destroy small areas of heart tissue where abnormal heartbeats may cause an arrhythmia to start. Please see the instruction sheet given to you today.  See instruction sheet    You have been referred to Dr Marcelyn Bruins in Pulmonary

## 2012-08-27 NOTE — Assessment & Plan Note (Signed)
Her episodes of atrial fibrillation are likely due to her SVT. My hope is that once her SVT has been ablated, she will not have any recurrent atrial fibrillation.

## 2012-09-11 ENCOUNTER — Other Ambulatory Visit: Payer: Self-pay | Admitting: Internal Medicine

## 2012-09-11 ENCOUNTER — Telehealth: Payer: Self-pay | Admitting: Internal Medicine

## 2012-09-11 NOTE — Telephone Encounter (Signed)
Answered questions regarding FMLA paperwork.

## 2012-09-11 NOTE — Telephone Encounter (Signed)
Pt calling re SVT issues she is having today

## 2012-09-19 ENCOUNTER — Other Ambulatory Visit (INDEPENDENT_AMBULATORY_CARE_PROVIDER_SITE_OTHER): Payer: 59

## 2012-09-19 ENCOUNTER — Encounter (HOSPITAL_COMMUNITY): Payer: Self-pay | Admitting: Pharmacy Technician

## 2012-09-19 DIAGNOSIS — I498 Other specified cardiac arrhythmias: Secondary | ICD-10-CM

## 2012-09-19 DIAGNOSIS — I471 Supraventricular tachycardia: Secondary | ICD-10-CM

## 2012-09-19 LAB — BASIC METABOLIC PANEL
BUN: 21 mg/dL (ref 6–23)
Chloride: 103 mEq/L (ref 96–112)
Creatinine, Ser: 0.6 mg/dL (ref 0.4–1.2)
Glucose, Bld: 109 mg/dL — ABNORMAL HIGH (ref 70–99)
Potassium: 4.5 mEq/L (ref 3.5–5.1)

## 2012-09-19 LAB — CBC WITH DIFFERENTIAL/PLATELET
Basophils Absolute: 0 10*3/uL (ref 0.0–0.1)
Eosinophils Absolute: 0.3 10*3/uL (ref 0.0–0.7)
HCT: 32.5 % — ABNORMAL LOW (ref 36.0–46.0)
Lymphs Abs: 1.9 10*3/uL (ref 0.7–4.0)
MCHC: 32.6 g/dL (ref 30.0–36.0)
MCV: 86.8 fl (ref 78.0–100.0)
Monocytes Absolute: 0.5 10*3/uL (ref 0.1–1.0)
Neutrophils Relative %: 72.6 % (ref 43.0–77.0)
Platelets: 299 10*3/uL (ref 150.0–400.0)
RDW: 14.5 % (ref 11.5–14.6)
WBC: 10.1 10*3/uL (ref 4.5–10.5)

## 2012-09-25 ENCOUNTER — Institutional Professional Consult (permissible substitution): Payer: 59 | Admitting: Pulmonary Disease

## 2012-09-26 ENCOUNTER — Ambulatory Visit (HOSPITAL_COMMUNITY): Payer: 59 | Admitting: *Deleted

## 2012-09-26 ENCOUNTER — Encounter (HOSPITAL_COMMUNITY)
Admission: RE | Disposition: A | Discharge status: Home / Self Care | Payer: Self-pay | Source: Ambulatory Visit | Attending: Internal Medicine

## 2012-09-26 ENCOUNTER — Encounter (HOSPITAL_COMMUNITY): Payer: Self-pay | Admitting: *Deleted

## 2012-09-26 ENCOUNTER — Encounter (HOSPITAL_COMMUNITY): Payer: Self-pay

## 2012-09-26 ENCOUNTER — Ambulatory Visit (HOSPITAL_COMMUNITY)
Admission: RE | Admit: 2012-09-26 | Discharge: 2012-09-26 | Disposition: A | Discharge status: Home / Self Care | Payer: 59 | Source: Ambulatory Visit | Attending: Internal Medicine | Admitting: Internal Medicine

## 2012-09-26 DIAGNOSIS — I498 Other specified cardiac arrhythmias: Secondary | ICD-10-CM | POA: Insufficient documentation

## 2012-09-26 DIAGNOSIS — I4891 Unspecified atrial fibrillation: Secondary | ICD-10-CM | POA: Insufficient documentation

## 2012-09-26 DIAGNOSIS — G4733 Obstructive sleep apnea (adult) (pediatric): Secondary | ICD-10-CM | POA: Insufficient documentation

## 2012-09-26 DIAGNOSIS — I471 Supraventricular tachycardia: Secondary | ICD-10-CM

## 2012-09-26 DIAGNOSIS — I10 Essential (primary) hypertension: Secondary | ICD-10-CM | POA: Insufficient documentation

## 2012-09-26 HISTORY — PX: V-TACH ABLATION: SHX5498

## 2012-09-26 LAB — CREATININE, SERUM
Creatinine, Ser: 0.54 mg/dL (ref 0.50–1.10)
GFR calc Af Amer: >90 mL/min (ref >90)
GFR calc non Af Amer: >90 mL/min (ref >90)

## 2012-09-26 LAB — CBC
Platelets: 280 10*3/uL (ref 150–400)
RBC: 3.59 MIL/uL — ABNORMAL LOW (ref 3.87–5.11)
RDW: 13.8 % (ref 11.5–15.5)
WBC: 9.2 10*3/uL (ref 4.0–10.5)

## 2012-09-26 SURGERY — V-TACH ABLATION
Anesthesia: Monitor Anesthesia Care

## 2012-09-26 MED ORDER — MIDAZOLAM HCL 5 MG/5ML IJ SOLN
INTRAMUSCULAR | Status: DC | PRN
  Administered 2012-09-26 (×2): 2 mg via INTRAVENOUS

## 2012-09-26 MED ORDER — SODIUM CHLORIDE 0.9 % IJ SOLN
3.0000 mL | Freq: Two times a day (BID) | INTRAMUSCULAR | Status: DC
  Administered 2012-09-26: 3 mL via INTRAVENOUS

## 2012-09-26 MED ORDER — SODIUM CHLORIDE 0.9 % IJ SOLN: 3.0000 mL | INTRAMUSCULAR | Status: DC | PRN

## 2012-09-26 MED ORDER — VALSARTAN-HYDROCHLOROTHIAZIDE 160-12.5 MG PO TABS: 1.0000 | ORAL_TABLET | Freq: Every day | ORAL | Status: DC

## 2012-09-26 MED ORDER — ONDANSETRON HCL 4 MG/2ML IJ SOLN
INTRAMUSCULAR | Status: DC | PRN
  Administered 2012-09-26: 4 mg via INTRAVENOUS

## 2012-09-26 MED ORDER — HYDROXYUREA 500 MG PO CAPS
ORAL_CAPSULE | ORAL | Status: AC
  Filled 2012-09-26: qty 1

## 2012-09-26 MED ORDER — BUPIVACAINE HCL (PF) 0.25 % IJ SOLN
INTRAMUSCULAR | Status: AC
  Filled 2012-09-26: qty 60

## 2012-09-26 MED ORDER — HYDROCHLOROTHIAZIDE 12.5 MG PO CAPS
12.5000 mg | ORAL_CAPSULE | Freq: Every day | ORAL | Status: DC
  Administered 2012-09-26: 12.5 mg via ORAL
  Filled 2012-09-26: qty 1

## 2012-09-26 MED ORDER — HEPARIN SODIUM (PORCINE) 5000 UNIT/ML IJ SOLN
5000.0000 [IU] | Freq: Three times a day (TID) | INTRAMUSCULAR | Status: DC
  Administered 2012-09-26: 5000 [IU] via SUBCUTANEOUS
  Filled 2012-09-26 (×3): qty 1

## 2012-09-26 MED ORDER — IBUPROFEN 200 MG PO TABS
200.0000 mg | ORAL_TABLET | Freq: Four times a day (QID) | ORAL | Status: DC | PRN
  Filled 2012-09-26: qty 1

## 2012-09-26 MED ORDER — PROPOFOL 10 MG/ML IV BOLUS
INTRAVENOUS | Status: DC | PRN
  Administered 2012-09-26: 200 mg via INTRAVENOUS

## 2012-09-26 MED ORDER — SODIUM CHLORIDE 0.9 % IV SOLN: 250.0000 mL | INTRAVENOUS | Status: DC | PRN

## 2012-09-26 MED ORDER — DILTIAZEM HCL ER COATED BEADS 180 MG PO CP24
180.0000 mg | ORAL_CAPSULE | Freq: Every day | ORAL | Status: DC
  Administered 2012-09-26: 180 mg via ORAL
  Filled 2012-09-26: qty 1

## 2012-09-26 MED ORDER — PROPOFOL INFUSION 10 MG/ML OPTIME
INTRAVENOUS | Status: DC | PRN
  Administered 2012-09-26: 50 ug/kg/min via INTRAVENOUS

## 2012-09-26 MED ORDER — ONDANSETRON HCL 4 MG/2ML IJ SOLN: 4.0000 mg | Freq: Four times a day (QID) | INTRAMUSCULAR | Status: DC | PRN

## 2012-09-26 MED ORDER — HYDROMORPHONE HCL PF 1 MG/ML IJ SOLN: 0.2500 mg | INTRAMUSCULAR | Status: DC | PRN

## 2012-09-26 MED ORDER — LACTATED RINGERS IV SOLN
INTRAVENOUS | Status: DC | PRN
  Administered 2012-09-26 (×3): via INTRAVENOUS

## 2012-09-26 MED ORDER — ACETAMINOPHEN 325 MG PO TABS
650.0000 mg | ORAL_TABLET | ORAL | Status: DC | PRN
  Administered 2012-09-26: 650 mg via ORAL
  Filled 2012-09-26: qty 2

## 2012-09-26 MED ORDER — FENTANYL CITRATE 0.05 MG/ML IJ SOLN
INTRAMUSCULAR | Status: DC | PRN
  Administered 2012-09-26: 50 ug via INTRAVENOUS
  Administered 2012-09-26: 25 ug via INTRAVENOUS
  Administered 2012-09-26: 50 ug via INTRAVENOUS
  Administered 2012-09-26: 25 ug via INTRAVENOUS
  Administered 2012-09-26: 100 ug via INTRAVENOUS

## 2012-09-26 MED ORDER — IRBESARTAN 150 MG PO TABS
150.0000 mg | ORAL_TABLET | Freq: Every day | ORAL | Status: DC
  Administered 2012-09-26: 150 mg via ORAL
  Filled 2012-09-26: qty 1

## 2012-09-26 MED ORDER — LIDOCAINE HCL (CARDIAC) 20 MG/ML IV SOLN
INTRAVENOUS | Status: DC | PRN
  Administered 2012-09-26: 30 mg via INTRAVENOUS

## 2012-09-26 MED ORDER — ONDANSETRON HCL 4 MG/2ML IJ SOLN: 4.0000 mg | Freq: Once | INTRAMUSCULAR | Status: DC | PRN

## 2012-09-26 MED ORDER — PHENYLEPHRINE HCL 10 MG/ML IJ SOLN
INTRAMUSCULAR | Status: DC | PRN
  Administered 2012-09-26 (×4): 80 ug via INTRAVENOUS
  Administered 2012-09-26: 40 ug via INTRAVENOUS
  Administered 2012-09-26 (×3): 80 ug via INTRAVENOUS

## 2012-09-26 NOTE — Transfer of Care (Signed)
Immediate Anesthesia Transfer of Care Note  Patient: Misty Mahoney  Procedure(s) Performed: Procedure(s) (LRB) with comments: V-TACH ABLATION (N/A)  Patient Location: PACU  Anesthesia Type:General  Level of Consciousness: awake, alert  and oriented  Airway & Oxygen Therapy: Patient Spontanous Breathing and Patient connected to nasal cannula oxygen  Post-op Assessment: Report given to PACU RN and Post -op Vital signs reviewed and stable  Post vital signs: Reviewed and stable  Complications: No apparent anesthesia complications

## 2012-09-26 NOTE — Op Note (Signed)
EPS/RFA of the slow pathway in a patient with prior AVNRT carried out without immediate complication. [id number].

## 2012-09-26 NOTE — Anesthesia Postprocedure Evaluation (Signed)
  Anesthesia Post-op Note  Patient: Misty Mahoney  Procedure(s) Performed: Procedure(s) (LRB) with comments: V-TACH ABLATION (N/A)  Patient Location: PACU  Anesthesia Type:General  Level of Consciousness: awake, alert , oriented and patient cooperative  Airway and Oxygen Therapy: Patient Spontanous Breathing  Post-op Pain: none  Post-op Assessment: Post-op Vital signs reviewed, Patient's Cardiovascular Status Stable, Respiratory Function Stable, Patent Airway, No signs of Nausea or vomiting and Pain level controlled  Post-op Vital Signs: stable  Complications: No apparent anesthesia complications

## 2012-09-26 NOTE — Anesthesia Preprocedure Evaluation (Signed)
Anesthesia Evaluation  Patient identified by MRN, date of birth, ID band Patient awake    Reviewed: Allergy & Precautions, H&P , NPO status , Patient's Chart, lab work & pertinent test results  Airway Mallampati: I TM Distance: >3 FB Neck ROM: full    Dental   Pulmonary sleep apnea ,          Cardiovascular hypertension, + dysrhythmias Rhythm:regular Rate:Normal     Neuro/Psych    GI/Hepatic   Endo/Other    Renal/GU      Musculoskeletal   Abdominal   Peds  Hematology   Anesthesia Other Findings   Reproductive/Obstetrics                           Anesthesia Physical Anesthesia Plan  ASA: III  Anesthesia Plan: MAC and General   Post-op Pain Management:    Induction: Intravenous  Airway Management Planned: LMA and Mask  Additional Equipment:   Intra-op Plan:   Post-operative Plan: Extubation in OR  Informed Consent: I have reviewed the patients History and Physical, chart, labs and discussed the procedure including the risks, benefits and alternatives for the proposed anesthesia with the patient or authorized representative who has indicated his/her understanding and acceptance.     Plan Discussed with: CRNA, Anesthesiologist and Surgeon  Anesthesia Plan Comments:         Anesthesia Quick Evaluation

## 2012-09-26 NOTE — Interval H&P Note (Signed)
History and Physical Interval Note:  09/26/2012 7:12 AM  Misty Mahoney  has presented today for surgery, with the diagnosis of vt   The various methods of treatment have been discussed with the patient and family. After consideration of risks, benefits and other options for treatment, the patient has consented to  Procedure(s) (LRB) with comments: V-TACH ABLATION (N/A) as a surgical intervention .  The patient's history has been reviewed, patient examined, no change in status, stable for surgery.  I have reviewed the patient's chart and labs.  Questions were answered to the patient's satisfaction.     Leonia Reeves.D.

## 2012-09-26 NOTE — Addendum Note (Signed)
Addendum  created 09/26/12 1401 by Bishop Limbo, RN   Modules edited:Anesthesia Events, Anesthesia Flowsheet, Anesthesia LDA

## 2012-09-26 NOTE — Discharge Summary (Signed)
Discharge Summary   Patient ID: Misty Mahoney MRN: [id number], DOB/AGE: [birth year] 46 y.o.  Primary MD: Darnelle Bos, MD Primary Cardiologist: Dr. Graciela Husbands Admit date: 09/26/2012 D/C date:     09/26/2012      Primary Discharge Diagnoses:  1. SVT  - s/p RFCA 09/26/12  Secondary Discharge Diagnoses:  . Hypertension   . Atrial fibrillation   . OSA (obstructive sleep apnea)     Allergies No Known Allergies  Diagnostic Studies/Procedures:  09/26/12 - RFCA of SVT - See Op note  History of Present Illness: 46 y.o. female w/ the above medical problems who presented to [facility] on 09/26/12 for planned radiofrequency catheter ablation of symptomatic SVT.  Hospital Course: She presented to [facility] in stable condition. Radiofrequency catheter ablation was successfully performed (see Op note for full report, not available at time of dc). She tolerated the procedure well without complications. Her rhythm remained stable on telemetry. She was seen and evaluated by Dr. Ladona Ridgel who felt she was stable for discharge home with plans for follow up as scheduled below.  Discharge Vitals: Blood pressure 150/68, pulse 72, temperature 98.1 F (36.7 C), temperature source Oral, resp. rate 20, height 5\' 8"  (1.727 m), weight 230 lb (104.327 kg), last menstrual period 09/18/2012, SpO2 98.00%.  Labs: Component Value Date   WBC 9.2 09/26/2012   HGB 9.9* 09/26/2012   HCT 31.3* 09/26/2012   MCV 87.2 09/26/2012   PLT 280 09/26/2012    Lab 09/26/12 1258  CREATININE 0.54     Discharge Medications     Medication List     As of 09/26/2012  6:49 PM    TAKE these medications         acetaminophen 325 MG tablet   Commonly known as: TYLENOL   Take 650 mg by mouth every 6 (six) hours as needed. For pain      desvenlafaxine 50 MG 24 hr tablet   Commonly known as: PRISTIQ   Take 25 mg by mouth daily.      diltiazem 180 MG 24 hr capsule   Commonly known as: CARDIZEM CD   Take 180 mg by mouth daily.      ibuprofen 200 MG tablet   Commonly known as: ADVIL,MOTRIN   Take 200 mg by mouth every 6 (six) hours as needed. For pain      metoprolol succinate 25 MG 24 hr tablet   Commonly known as: TOPROL-XL   Take 25 mg by mouth daily.      valsartan-hydrochlorothiazide 160-12.5 MG per tablet   Commonly known as: DIOVAN-HCT   Take 1 tablet by mouth daily.          Disposition   Discharge Orders    Future Appointments: Provider: Department: Dept Phone: Center:   10/10/2012 11:00 AM Barbaraann Share, MD [facility] [phone] None     Future Orders Please Complete By Expires   Diet - low sodium heart healthy      Increase activity slowly      Discharge instructions      Comments:   * KEEP INCISION SITE(S) CLEAN AND DRY. Call the office for any signs of bleedings, pus, swelling, increased pain, or any other concerns.     Follow-up Information    Follow up with Lewayne Bunting, MD. (Our office will call you with your appointment time)    Contact information:   [facility] [address] [place] [address] [phone]  Outstanding Labs/Studies:  None  Duration of Discharge Encounter: Greater than 30 minutes including physician and PA time.  Signed, Avagail Whittlesey PA-C 09/26/2012, 6:49 PM

## 2012-09-26 NOTE — Addendum Note (Signed)
Addendum  created 09/26/12 1401 by Raad Clayson S Paylin Hailu, RN   Modules edited:Anesthesia Events, Anesthesia Flowsheet, Anesthesia LDA    

## 2012-09-26 NOTE — H&P (View-Only) (Signed)
HPI Misty Mahoney is referred today for consideration of catheter ablation. She had documented SVT initially 7 years ago. She was treated with adenosine. Since then she has been on calcium channel blockers but continued to have episodes of SVT. In addition, the patient will go on to atrial fibrillation from her SVT. She has been treated most recently with Cardizem. Because of persistent symptoms despite medical therapy, she is interested in pursuing catheter ablation.  Current Outpatient Prescriptions  Medication Sig Dispense Refill  . acetaminophen (TYLENOL) 325 MG tablet Take 650 mg by mouth every 6 (six) hours as needed. For pain      . desvenlafaxine (PRISTIQ) 50 MG 24 hr tablet Take 25 mg by mouth daily.      Marland Kitchen diltiazem (CARDIZEM CD) 120 MG 24 hr capsule Take 180 mg by mouth daily.       Marland Kitchen ibuprofen (ADVIL,MOTRIN) 200 MG tablet Take 200 mg by mouth every 6 (six) hours as needed. For pain      . metoprolol succinate (TOPROL-XL) 25 MG 24 hr tablet TAKE 1 TABLET BY MOUTH DAILY  30 tablet  0  . valsartan-hydrochlorothiazide (DIOVAN-HCT) 160-12.5 MG per tablet Take 1 tablet by mouth daily.         Past Medical History  Diagnosis Date  . SVT (supraventricular tachycardia)   . Hypertension   . Atrial fibrillation   . OSA (obstructive sleep apnea)     ROS:   All systems reviewed and negative except as noted in the HPI.   No past surgical history on file.   No family history on file.   History   Social History  . Marital Status: Legally Separated    Spouse Name: N/A    Number of Children: N/A  . Years of Education: N/A   Occupational History  . Not on file.   Social History Main Topics  . Smoking status: Never Smoker   . Smokeless tobacco: Never Used  . Alcohol Use: Yes     One a month  . Drug Use: No  . Sexually Active: Not on file   Other Topics Concern  . Not on file   Social History Narrative  . No narrative on file     BP 130/70  Pulse 70  Ht 5\' 8"  (1.727  m)  Wt 238 lb (107.956 kg)  BMI 36.19 kg/m2  Physical Exam:  Well appearing middle-aged woman, NAD HEENT: Unremarkable Neck:  No JVD, no thyromegally Lungs:  Clear with no wheezes, rales, or rhonchi. HEART:  Regular rate rhythm, no murmurs, no rubs, no clicks Abd:  soft, positive bowel sounds, no organomegally, no rebound, no guarding Ext:  2 plus pulses, no edema, no cyanosis, no clubbing Skin:  No rashes no nodules Neuro:  CN II through XII intact, motor grossly intact  EKG Normal sinus rhythm with right atrial enlargement and poor R-wave progression Assess/Plan:

## 2012-09-27 NOTE — Op Note (Signed)
NAMEEBELIN, DILLEHAY NO.:  [id number]  MEDICAL RECORD NO.:  [id number]  LOCATION:  [id number]                        FACILITY:  [facility]  PHYSICIAN:  Doylene Canning. Ladona Ridgel, MD    DATE OF BIRTH:  20-Mar-1966  DATE OF PROCEDURE:  09/26/2012 DATE OF DISCHARGE:  09/26/2012                              OPERATIVE REPORT   PROCEDURE PERFORMED:  Electrophysiologic study and RF catheter ablation of a slow pathway in a patient with presumed AV node reentrant tachycardia.  INTRODUCTION:  The patient is a 46 year old woman with a history of tachypalpitations and documented SVT in the past, presumed due to AV node reentrant tachycardia.  She has had multiple visits with SVT. These are terminated with adenosine.  The patient has initially tried medical therapy, but then developed recurrent tachypalpitations and SVT and is now referred for catheter ablation.  PROCEDURE:  After informed consent was obtained, the patient was taken to the diagnostic EP lab in a fasting state.  Because of the patient's preference, general anesthesia was utilized for sedation.  A 6-French hexapolar catheter was inserted percutaneously into the right jugular vein and advanced to the coronary sinus.  A 6-French quadripolar catheter was inserted percutaneously in the right femoral vein and advanced to the right ventricle.  A 6-French quadripolar catheter was inserted percutaneously in the right femoral vein and advanced to the His bundle region.  After measurement of the basic intervals, rapid ventricular pacing was carried out from the right ventricle demonstrating VA dissociation at 600 milliseconds.  Programmed ventricular stimulation was carried out demonstrating VA dissociation at 600 milliseconds.  Programmed atrial stimulation was then carried out from the atrium at a base drive cycle length of 409 msec with stepwise decrease down to 300 msec where the AV node ERP was observed.  During programmed atrial  stimulation, there were multiple jumps and echo beats and double echo beats.  There was no inducible SVT.  Rapid atrial pacing was then carried out from the atrium at a base drive cycle length of 811 msec and stepwise decreased down to 350 milliseconds where AV Wenckebach was observed.  During rapid atrial pacing, the PR interval remained less than the RR interval, but equal to the RR interval.  There was no pre- excitation.  There was no inducible SVT.  Isoproterenol was then infused at rates from 1-4 mcg per minute.  Rapid ventricular pacing was then carried out demonstrating a VA Wenckebach cycle length of 380 msec. During rapid ventricular pacing, the atrial activation sequence was midline and decremental.  Next, programmed ventricular stimulation was carried out from the right ventricle at a base drive cycle length of 914 msec.  The S1-S2 interval stepwise decreased down to 270 msec, where ventricular refractoriness was observed.  During programmed ventricular stimulation, the atrial activation sequence was midline and decremental. During programmed ventricular stimulation, there were retrograde echo beats and double echo beats.  Finally additional rapid atrial pacing was carried out on isoproterenol down to 220 milliseconds.  This demonstrated no inducible SVT.  Programmed atrial stimulation was carried out from the atrium at base drive cycle length of 782, 500, and 400 msec and the S1-S2 interval stepwise decreased  down to 220 msec, but there was no inducible sustained SVT.  During programmed atrial stimulation, there were echo beats and double echo beats noted.  It was felt that the most likely explanation for the patient's inability to induce SVT was her general anesthesia.  We attempted to lighten the anesthesia in our patient, but she became quite combative and required very heavy amounts of sedation for adequate sedation.  At this point, the decision was made to proceed with  slow pathway modification.  A 7- French quadripolar ablation catheter was inserted percutaneously into the right femoral vein and advanced into the slow pathway region. Mapping was carried out.  4 RF energy applications were delivered resulting in the creation of accelerated junctional rhythm.  There was no block demonstrated.  Following ablation, programmed atrial stimulation was again carried out from the coronary sinus and atrium as well as rapid atrial pacing, rapid ventricular pacing and programmed ventricular stimulation, there were no inducible SVT.  There was no evidence of any residual slow pathway conduction.  At this point, the catheters were removed, hemostasis was assured, and the patient was returned to her room in satisfactory condition.  COMPLICATIONS:  There were no immediate procedure complications.  RESULTS:  A.  Baseline ECG.  Baseline ECG demonstrates sinus rhythm with normal axis and intervals. B.  Baseline intervals.  Sinus node cycle length was 710 msec.  The QRS duration 80 msec.  The HV interval 42 msec.  The AH interval 81 msec. Following ablation, there were no change in the intervals. C.  Rapid ventricular pacing.  Initially rapid ventricular pacing demonstrated VA dissociation.  On isoproterenol, there was intact VA conduction with midline atrial activation, which was decremental. D.  Programmed ventricular stimulation.  Programmed ventricular stimulation was carried out from the right ventricle at a base drive cycle length of 478 and 500 msec.  The S1 and S2 interval was stepwise decreased down to 270 milliseconds where ventricular refractoriness was observed.  During programmed ventricular stimulation, the atrial activation sequence on isoproterenol was midline and decremental. E.  Rapid atrial pacing.  Rapid atrial pacing was carried out from the atrium at base drive cycle length of 295 msec and stepwise decreased down to 220 msec.  During rapid atrial  pacing, there were no inducible sustained arrhythmias.  The PR interval was equal to the RR interval. F.  Programmed atrial stimulation.  Programmed atrial stimulation was carried out from the atrium at base drive cycle length of 621, 500, and 400 msec.  The S1 and S2 interval stepwise decreased down to atrial refractoriness.  During programmed atrial stimulation, there were echo beats and double echo beats noted, but no sustained SVT.  The PR interval was equal to the RR interval. G.  Arrhythmias were observed. 1. Atrial fibrillation.  During the catheter ablation, duration was     sustained.  Initiation was with programmed atrial stimulation.     Termination was with DC cardioversion at 200 joules.     a.     Mapping.  Mapping of the patient's slow pathway demonstrated      normal size and orientation.     b.     RF energy application.  A total of 4 RF energy applications      were delivered in the slow pathway region in this patient with      recurrent SVT and no evidence of accessory pathway conduction, no      evidence of atrial tachycardia, and no evidence  of atrial flutter,      and her slow pathway was successfully modified.  CONCLUSION:  This study demonstrates no inducible sustained SVT.  It does demonstrate echo and double echo beats in a patient with documented SVT in the past.  There was no evidence of accessory pathway conduction, and successful slow pathway modification was carried out without immediate procedure complication.  The procedures was limited by the fact that we could not induce sustained SVT making our end point for ablation more difficult.     Doylene Canning. Ladona Ridgel, MD     GWT/MEDQ  D:  09/26/2012  T:  09/27/2012  Job:  [id number]  cc:   Duke Salvia, MD, [facility]

## 2012-09-30 ENCOUNTER — Telehealth: Payer: Self-pay | Admitting: Internal Medicine

## 2012-09-30 NOTE — Telephone Encounter (Signed)
New Problem:    Patient called in to follow-up on her FMLA paperwork.  Please call back.

## 2012-09-30 NOTE — Telephone Encounter (Signed)
Patient called stated she had a ablation last Thursday 09/26/12.States she still feels wiped out,tired.States she is suppose to return to work tomorrow 10/01/12 but she works with [employer] and works 12 to 16 hours a day.States she is not ready to return to [facility] like to wait until this Thursday.Also wants to check on FMLA forms she left for Dr.Klein's nurse.Message sent to Dr.Klein's nurse.

## 2012-09-30 NOTE — Telephone Encounter (Signed)
Pt calling back stating she was to call back if she hadn't heard from [place] before five because our phones go on then and she wouldn't be able to get through, pls call re rtn to work and fmla forms [phone]

## 2012-09-30 NOTE — Telephone Encounter (Signed)
Pt had an ablation by dr taylor, pt has questions, pls call [phone]

## 2012-10-01 NOTE — Telephone Encounter (Signed)
I spoke with Misty Mahoney updating her on her FMLA paperwork.  She now has questions for Dr. Ladona Ridgel regarding follow up from her recent ablation.  Will forward to Dr. Lubertha Basque nurse to contact Misty Mahoney and address Misty Mahoney's questions.

## 2012-10-01 NOTE — Telephone Encounter (Signed)
She has an upper respiratory infection and I have asked her to contact her PCP Dr Earl Gala is this does not resolve.  From a procedure standpoint I explained to her that the MD's usually give 1 week s/p procedure to be out of work.  Patient verbalized the understanding and is going to try to go back to work on [employer].  Will send back to Dr Odessa Fleming nurse until [facility] is done

## 2012-10-10 ENCOUNTER — Ambulatory Visit (INDEPENDENT_AMBULATORY_CARE_PROVIDER_SITE_OTHER): Payer: 59 | Admitting: Pulmonary Disease

## 2012-10-10 ENCOUNTER — Encounter: Payer: Self-pay | Admitting: Pulmonary Disease

## 2012-10-10 VITALS — BP 140/78 | HR 76 | Temp 97.9°F | Ht 68.0 in | Wt 238.0 lb

## 2012-10-10 DIAGNOSIS — G4733 Obstructive sleep apnea (adult) (pediatric): Secondary | ICD-10-CM

## 2012-10-10 NOTE — Progress Notes (Signed)
  Subjective:    Patient ID: Misty Mahoney, female    DOB: May 08, 1966, 46 y.o.   MRN: [id number]  HPI The patient is a 46 year old female who I've been asked to see for management of obstructive sleep apnea.  She was diagnosed with moderate sleep apnea in 2005, and was on CPAP for approximately 3-4 years.  She then underwent a UP3 and felt that her symptoms were improved, and therefore discontinued CPAP.  Most recently, she's been noted to have snoring as well as an abnormal breathing pattern during sleep.  She has had frequent awakenings during the night, and feels unrested at least 70% of the time.  She has intermittent sleep pressure during that day, but stays very active on her job.  She does get sleepy reading or watching TV in the evening, but denies any sleepiness with driving.  She has had a home sleep test in September which showed an AHI of 43 events per hour with desaturation to 79%.  Of note, her weight is neutral over the last few years, and her Epworth score today is 10.  Sleep Questionnaire: What time do you typically go to bed?( Between what hours) 10-11pm How long does it take you to fall asleep? 5-15 minutes How many times during the night do you wake up? 3 What time do you get out of bed to start your day? 0530 Do you drive or operate heavy machinery in your occupation? No How much has your weight changed (up or down) over the past two years? (In pounds) 0 oz (0 kg) Have you ever had a sleep study before? Yes If yes, location of study? [facility] and at home If yes, date of study? 2009 and 2013 Do you currently use CPAP? No Do you wear oxygen at any time? No    Review of Systems  Constitutional: Positive for fever. Negative for unexpected weight change.  HENT: Positive for congestion, sore throat, rhinorrhea, trouble swallowing, postnasal drip and sinus pressure. Negative for ear pain, nosebleeds, sneezing and dental problem.   Eyes: Negative for redness and itching.  Respiratory:  Positive for cough. Negative for chest tightness, shortness of breath and wheezing.   Cardiovascular: Positive for palpitations. Negative for leg swelling.  Gastrointestinal: Negative for nausea and vomiting.  Genitourinary: Negative for dysuria.  Musculoskeletal: Negative for joint swelling.  Skin: Negative for rash.  Neurological: Negative for headaches.  Hematological: Does not bruise/bleed easily.  Psychiatric/Behavioral: Negative for dysphoric mood. The patient is not nervous/anxious.        Objective:   Physical Exam Constitutional:  Obese female, no acute distress  HENT:  Nares patent without discharge  Oropharynx without exudate, palate and uvula are trimmed.  Eyes:  Perrla, eomi, no scleral icterus  Neck:  No JVD, no TMG  Cardiovascular:  Normal rate, regular rhythm, no rubs or gallops.  No murmurs        Intact distal pulses  Pulmonary :  Normal breath sounds, no stridor or respiratory distress   No rales, rhonchi, or wheezing  Abdominal:  Soft, nondistended, bowel sounds present.  No tenderness noted.   Musculoskeletal:  No lower extremity edema noted, +varicosities.  Lymph Nodes:  No cervical lymphadenopathy noted  Skin:  No cyanosis noted  Neurologic:  Alert, appropriate, moves all 4 extremities without obvious deficit.         Assessment & Plan:

## 2012-10-10 NOTE — Patient Instructions (Addendum)
Will get you a loaner autoset device for the next 3 weeks to optimize your pressure.  Will then have your cpap machine set on this level. Will get you a new mask Work on weight reduction followup with me in 6 weeks.

## 2012-10-10 NOTE — Assessment & Plan Note (Signed)
The patient has recently diagnosed severe obstructive sleep apnea on a recent sleep test, and will do best with CPAP while trying to work on weight loss.  She has a CPAP machine at home but apparently is functioning well, and therefore I would like to use an auto titrating device for a few weeks to re\re optimize her pressure.  She will also need a new CPAP mask.  I have encouraged her to work aggressively on weight loss, and we'll let her know her optimal pressure once I get her download.

## 2012-10-16 ENCOUNTER — Other Ambulatory Visit: Payer: Self-pay | Admitting: Internal Medicine

## 2012-10-16 MED ORDER — METOPROLOL SUCCINATE ER 25 MG PO TB24: 25.0000 mg | ORAL_TABLET | Freq: Every day | ORAL | Status: DC

## 2012-10-16 MED ORDER — VALSARTAN-HYDROCHLOROTHIAZIDE 160-12.5 MG PO TABS: 1.0000 | ORAL_TABLET | Freq: Every day | ORAL | Status: DC

## 2012-10-16 MED ORDER — DILTIAZEM HCL ER COATED BEADS 180 MG PO CP24: 180.0000 mg | ORAL_CAPSULE | Freq: Every day | ORAL | Status: DC

## 2012-10-16 NOTE — Telephone Encounter (Signed)
Pt called, asked about her FMLA papers.  Caralee Ates, CMA

## 2012-10-18 ENCOUNTER — Other Ambulatory Visit: Payer: Self-pay

## 2012-10-22 ENCOUNTER — Ambulatory Visit (INDEPENDENT_AMBULATORY_CARE_PROVIDER_SITE_OTHER): Payer: 59 | Admitting: Internal Medicine

## 2012-10-22 ENCOUNTER — Encounter: Payer: Self-pay | Admitting: Internal Medicine

## 2012-10-22 VITALS — BP 141/84 | HR 72 | Ht 68.0 in | Wt 242.1 lb

## 2012-10-22 DIAGNOSIS — I498 Other specified cardiac arrhythmias: Secondary | ICD-10-CM

## 2012-10-22 DIAGNOSIS — I471 Supraventricular tachycardia: Secondary | ICD-10-CM

## 2012-10-22 NOTE — Assessment & Plan Note (Signed)
She is status post catheter ablation and appears to be doing well. She has had no recurrent sustained arrhythmias. While I would like to stop her beta blocker as well as her calcium channel blocker, she does have palpitations and hypertension. She is bothered particularly by these medications. We will continue them.

## 2012-10-22 NOTE — Progress Notes (Signed)
HPI Mrs. Crites returns today for followup. She is a very pleasant 46 year old woman with a history of SVT, who underwent catheter ablation several weeks ago. At that time she was found to have very difficult to induce AV node reentrant tachycardia, which was successfully ablated. In the interim, she has done well. She is bothered by hypertension and she does have occasional palpitations. No sustained arrhythmias however. She denies syncope or peripheral edema. No Known Allergies   Current Outpatient Prescriptions  Medication Sig Dispense Refill  . valsartan-hydrochlorothiazide (DIOVAN-HCT) 320-12.5 MG per tablet Take 1 tablet by mouth daily.      Marland Kitchen acetaminophen (TYLENOL) 325 MG tablet Take 650 mg by mouth every 6 (six) hours as needed. For pain      . desvenlafaxine (PRISTIQ) 50 MG 24 hr tablet Take 25 mg by mouth daily.      Marland Kitchen diltiazem (CARDIZEM CD) 180 MG 24 hr capsule Take 1 capsule (180 mg total) by mouth daily.  90 capsule  1  . ibuprofen (ADVIL,MOTRIN) 200 MG tablet Take 200 mg by mouth every 6 (six) hours as needed. For pain      . metoprolol succinate (TOPROL-XL) 25 MG 24 hr tablet Take 1 tablet (25 mg total) by mouth daily.  90 tablet  1     Past Medical History  Diagnosis Date  . SVT (supraventricular tachycardia)     s/p RFCA 09/26/12  . Hypertension   . Atrial fibrillation   . OSA (obstructive sleep apnea)     ROS:   All systems reviewed and negative except as noted in the HPI.   No past surgical history on file.   No family history on file.   History   Social History  . Marital Status: Legally Separated    Spouse Name: N/A    Number of Children: N/A  . Years of Education: N/A   Occupational History  . Not on file.   Social History Main Topics  . Smoking status: Never Smoker   . Smokeless tobacco: Never Used  . Alcohol Use: Yes     Comment: One a month  . Drug Use: No  . Sexually Active: Not on file   Other Topics Concern  . Not on file    Social History Narrative  . No narrative on file     BP 141/84  Pulse 72  Ht 5\' 8"  (1.727 m)  Wt 242 lb 1.9 oz (109.825 kg)  BMI 36.81 kg/m2  Physical Exam:  Well appearing middle-aged woman, NAD HEENT: Unremarkable Neck:  No JVD, no thyromegally Lungs:  Clear with no wheezes, rales, or rhonchi. HEART:  Regular rate rhythm, no murmurs, no rubs, no clicks Abd:  soft, positive bowel sounds, no organomegally, no rebound, no guarding Ext:  2 plus pulses, no edema, no cyanosis, no clubbing Skin:  No rashes no nodules Neuro:  CN II through XII intact, motor grossly intact  EKg Normal sinus rhythm with normal axis and intervals.   Assess/Plan:

## 2012-10-22 NOTE — Patient Instructions (Signed)
Your physician recommends that you schedule a follow-up appointment as needed  

## 2012-11-21 ENCOUNTER — Ambulatory Visit: Payer: 59 | Admitting: Pulmonary Disease

## 2012-12-06 ENCOUNTER — Telehealth: Payer: Self-pay | Admitting: Pulmonary Disease

## 2012-12-06 ENCOUNTER — Other Ambulatory Visit: Payer: Self-pay | Admitting: Pulmonary Disease

## 2012-12-06 DIAGNOSIS — G4733 Obstructive sleep apnea (adult) (pediatric): Secondary | ICD-10-CM

## 2012-12-06 NOTE — Telephone Encounter (Signed)
LMOMCB x 1

## 2012-12-06 NOTE — Telephone Encounter (Signed)
Results for patient's Auto CPAP Compliance report is in your green folder for review.   Please advise Dr Shelle Iron, thanks.

## 2012-12-06 NOTE — Telephone Encounter (Signed)
Please let pt know that her download shows the following: 1) only wore cpap 24/37 days 2) on days that she wore, 18 for more than 4 hrs and 6 days less than 4 hours. 3) optimal cpap appears to be 18cm of water pressure.  Will send order to dme to increase pressure to 14 for 2 weeks, then to 18 after that.   She needs to wear cpap more consistently, and for greater than 4 hrs each night.

## 2012-12-31 NOTE — Telephone Encounter (Signed)
lmomtcb x 2  

## 2013-02-05 NOTE — Telephone Encounter (Signed)
Spoke with patient in regards to CPAP titration D/L. We have been unable to reach her since getting her results. Pt refused to make an appt--aware that she was supposed to f/u in 6 weeks after last OV (09/2012) and she did not keep that appt  11/21/12 @945  (NOS).  Patient refused to go ahead and schedule an appt today. Pt is still having trouble keeping the mask on at night-keeps pulling it off in her sleep. Pressure is still at 14 and is going to be increased to 18 in about a week or so. Pt c/o dry mouth in the mornings and unable to leave mask on during the night. I expressed the importance of her coming in for an appt but she refused and wants Dr Shelle Iron recommendations as to what she needs to do. Pt aware that [facility] out of office until [facility] 3/20 2pm  Dr Shelle Iron please advise. Thanks.

## 2013-02-06 NOTE — Telephone Encounter (Signed)
She needs ov, and if she does not, her insurance will deny payment for her cpap and the dme will take it back.

## 2013-02-07 NOTE — Telephone Encounter (Signed)
Pt states that she was unable to schedule appt at this time and will call back in a few hours. Pt seemed to understand that she needs to remain compliant with 1 year f/u for Insurance and DME to continue covering CPAP machine. I explained to her that if she is non-compliant, she will not be able to get an order for new supplies and CPAP will eventually be taken away.  Patient did not seem too concerned with this stating that her machine is "well paid for". Will await call back.

## 2013-02-20 NOTE — Telephone Encounter (Signed)
Call to touch base with patient to schedule f/u appt with [facility] for compliance appt with CPAP. Last spoke to patient 3/21 and patient showed no interest in scheduling an appt to come back. Patient aware that if f/u is not made she will not be able to have any orders placed for new supplies with CPAP. Patient expressed understanding.   Spoke with patient, patient is scheduled for 03/10/13 at 345 with [facility].

## 2013-03-04 ENCOUNTER — Other Ambulatory Visit (HOSPITAL_COMMUNITY): Payer: Self-pay | Admitting: Internal Medicine

## 2013-03-04 DIAGNOSIS — R109 Unspecified abdominal pain: Secondary | ICD-10-CM

## 2013-03-06 ENCOUNTER — Ambulatory Visit (HOSPITAL_COMMUNITY)
Admission: RE | Admit: 2013-03-06 | Discharge: 2013-03-06 | Disposition: A | Discharge status: Home / Self Care | Payer: 59 | Source: Ambulatory Visit | Attending: Internal Medicine | Admitting: Internal Medicine

## 2013-03-06 DIAGNOSIS — R109 Unspecified abdominal pain: Secondary | ICD-10-CM | POA: Insufficient documentation

## 2013-03-10 ENCOUNTER — Ambulatory Visit (INDEPENDENT_AMBULATORY_CARE_PROVIDER_SITE_OTHER): Payer: 59 | Admitting: Pulmonary Disease

## 2013-03-10 ENCOUNTER — Encounter: Payer: Self-pay | Admitting: Pulmonary Disease

## 2013-03-10 VITALS — BP 142/80 | HR 77 | Temp 98.0°F | Ht 68.5 in | Wt 239.0 lb

## 2013-03-10 DIAGNOSIS — G4733 Obstructive sleep apnea (adult) (pediatric): Secondary | ICD-10-CM

## 2013-03-10 NOTE — Assessment & Plan Note (Signed)
The pt is doing well overall with cpap, but is still trying to optimize her mask fit. She has not gotten her pressure to the optimal level as of yet, and I have encouraged her to do so.  She is to let me know if she thinks is too high.  Finally, I have encouraged her to work on weight loss.

## 2013-03-10 NOTE — Progress Notes (Signed)
  Subjective:    Patient ID: Misty Mahoney, female    DOB: Jun 22, 1966, 47 y.o.   MRN: [id number]  HPI The patient comes in today for followup of her obstructive sleep apnea.  She tells me that she is wearing the device compliantly, but she has not had her pressure increased to the optimal level as of yet.  She is having some issues with mask fit, and is going to work on this with her medical equipment company to resolve the issue.  She thinks she has seen improvement in her sleep and daytime alertness since being on the CPAP.   Review of Systems  Constitutional: Negative for fever and unexpected weight change.  HENT: Negative for ear pain, nosebleeds, congestion, sore throat, rhinorrhea, sneezing, trouble swallowing, dental problem, postnasal drip and sinus pressure.   Eyes: Negative for redness and itching.  Respiratory: Negative for cough, chest tightness, shortness of breath and wheezing.   Cardiovascular: Positive for palpitations ( arrythmia). Negative for leg swelling.  Gastrointestinal: Negative for nausea and vomiting.  Genitourinary: Negative for dysuria.  Musculoskeletal: Negative for joint swelling.  Skin: Negative for rash.  Neurological: Negative for headaches.  Hematological: Does not bruise/bleed easily.  Psychiatric/Behavioral: Negative for dysphoric mood. The patient is not nervous/anxious.        Objective:   Physical Exam Overweight female in no acute distress Nose without purulence or discharge noted No skin breakdown or pressure necrosis from the CPAP mask Neck without lymphadenopathy or thyromegaly Lower extremities without edema, no cyanosis Alert, does not appear to be sleepy, moves all 4 extremities.       Assessment & Plan:

## 2013-03-10 NOTE — Patient Instructions (Addendum)
Work on mask fit with your equipment provider, but if not being successful, let me know and we can get you to the sleep center during the day for a mask fitting. Let me know if your pressure is an issue once you get to 18. Work on weight loss followup with me in one year if doing well.

## 2013-03-13 ENCOUNTER — Other Ambulatory Visit (HOSPITAL_COMMUNITY): Payer: Self-pay | Admitting: Internal Medicine

## 2013-03-13 DIAGNOSIS — R109 Unspecified abdominal pain: Secondary | ICD-10-CM

## 2013-03-14 ENCOUNTER — Ambulatory Visit (HOSPITAL_COMMUNITY): Payer: 59

## 2013-03-24 ENCOUNTER — Ambulatory Visit (INDEPENDENT_AMBULATORY_CARE_PROVIDER_SITE_OTHER): Payer: Commercial Managed Care - PPO | Admitting: Surgery

## 2013-03-24 ENCOUNTER — Encounter (INDEPENDENT_AMBULATORY_CARE_PROVIDER_SITE_OTHER): Payer: Self-pay | Admitting: Surgery

## 2013-03-24 VITALS — BP 156/88 | HR 74 | Resp 14 | Ht 68.0 in | Wt 234.4 lb

## 2013-03-24 DIAGNOSIS — K429 Umbilical hernia without obstruction or gangrene: Secondary | ICD-10-CM | POA: Insufficient documentation

## 2013-03-24 NOTE — Patient Instructions (Signed)
Hernia A hernia occurs when an internal organ pushes out through a weak spot in the abdominal wall. Hernias most commonly occur in the groin and around the navel. Hernias often can be pushed back into place (reduced). Most hernias tend to get worse over time. Some abdominal hernias can get stuck in the opening (irreducible or incarcerated hernia) and cannot be reduced. An irreducible abdominal hernia which is tightly squeezed into the opening is at risk for impaired blood supply (strangulated hernia). A strangulated hernia is a medical emergency. Because of the risk for an irreducible or strangulated hernia, surgery may be recommended to repair a hernia. CAUSES   Heavy lifting.  Prolonged coughing.  Straining to have a bowel movement.  A cut (incision) made during an abdominal surgery. HOME CARE INSTRUCTIONS   Bed rest is not required. You may continue your normal activities.  Avoid lifting more than 10 pounds (4.5 kg) or straining.  Cough gently. If you are a smoker it is best to stop. Even the best hernia repair can break down with the continual strain of coughing. Even if you do not have your hernia repaired, a cough will continue to aggravate the problem.  Do not wear anything tight over your hernia. Do not try to keep it in with an outside bandage or truss. These can damage abdominal contents if they are trapped within the hernia sac.  Eat a normal diet.  Avoid constipation. Straining over long periods of time will increase hernia size and encourage breakdown of repairs. If you cannot do this with diet alone, stool softeners may be used. SEEK IMMEDIATE MEDICAL CARE IF:   You have a fever.  You develop increasing abdominal pain.  You feel nauseous or vomit.  Your hernia is stuck outside the abdomen, looks discolored, feels hard, or is tender.  You have any changes in your bowel habits or in the hernia that are unusual for you.  You have increased pain or swelling around the  hernia.  You cannot push the hernia back in place by applying gentle pressure while lying down. MAKE SURE YOU:   Understand these instructions.  Will watch your condition.  Will get help right away if you are not doing well or get worse. Document Released: 11/06/2005 Document Revised: 01/29/2012 Document Reviewed: 06/25/2008 ExitCare Patient Information 2013 [employer].  

## 2013-03-24 NOTE — Progress Notes (Signed)
Patient ID: Misty Mahoney, female   DOB: 1966-03-26, 47 y.o.   MRN: [id number]  No chief complaint on file.   HPI Misty Mahoney is a 47 y.o. female.  Patient's at the request of Dr. Earl Gala for abdominal pain. She has mild intermittent periumbilical abdominal pain made worse with exertion. The pain is probably 2-310 and is described as a diffuse aching sensation. She gets this discomfort intermittently especially with intense exercise but is able to workout for the most part without pain. No nausea or vomiting. No feeding intolerance. Denies right upper quadrant pain, back pain, nausea, vomiting or diarrhea. HPI  Past Medical History  Diagnosis Date  . SVT (supraventricular tachycardia)     s/p RFCA 09/26/12  . Hypertension   . Atrial fibrillation   . OSA (obstructive sleep apnea)     Past Surgical History  Procedure Laterality Date  . Cardiac electrophysiology study and ablation    . Tonsillectomy      No family history on file.  Social History History  Substance Use Topics  . Smoking status: Never Smoker   . Smokeless tobacco: Never Used  . Alcohol Use: Yes     Comment: One a month    No Known Allergies  Current Outpatient Prescriptions  Medication Sig Dispense Refill  . acetaminophen (TYLENOL) 325 MG tablet Take 650 mg by mouth every 6 (six) hours as needed. For pain      . diltiazem (CARDIZEM CD) 180 MG 24 hr capsule Take 1 capsule (180 mg total) by mouth daily.  90 capsule  1  . ibuprofen (ADVIL,MOTRIN) 200 MG tablet Take 200 mg by mouth every 6 (six) hours as needed. For pain      . metoprolol succinate (TOPROL-XL) 25 MG 24 hr tablet Take 1 tablet (25 mg total) by mouth daily.  90 tablet  1  . valsartan-hydrochlorothiazide (DIOVAN-HCT) 320-12.5 MG per tablet Take 1 tablet by mouth daily.       No current facility-administered medications for this visit.    Review of Systems Review of Systems  Constitutional: Negative for fever, chills and unexpected weight  change.  HENT: Negative for hearing loss, congestion, sore throat, trouble swallowing and voice change.   Eyes: Negative for visual disturbance.  Respiratory: Negative for cough and wheezing.   Cardiovascular: Negative for chest pain, palpitations and leg swelling.  Gastrointestinal: Negative for nausea, vomiting, abdominal pain, diarrhea, constipation, blood in stool, abdominal distention and anal bleeding.  Genitourinary: Negative for hematuria, vaginal bleeding and difficulty urinating.  Musculoskeletal: Negative for arthralgias.  Skin: Negative for rash and wound.  Neurological: Negative for seizures, syncope and headaches.  Hematological: Negative for adenopathy. Does not bruise/bleed easily.  Psychiatric/Behavioral: Negative for confusion.    Blood pressure 156/88, pulse 74, resp. rate 14, height 5\' 8"  (1.727 m), weight 234 lb 6.4 oz (106.323 kg).  Physical Exam Physical Exam  Constitutional: She is oriented to person, place, and time. She appears well-developed and well-nourished.  HENT:  Head: Normocephalic and atraumatic.  Eyes: EOM are normal. Pupils are equal, round, and reactive to light.  Neck: Normal range of motion. Neck supple.  Pulmonary/Chest: Effort normal.  Abdominal: Soft. She exhibits no distension. There is no tenderness. There is no rebound and no guarding. A hernia is present.    Musculoskeletal: Normal range of motion.  Neurological: She is alert and oriented to person, place, and time.  Skin: Skin is warm and dry.  Psychiatric: She has a normal mood and affect.  Her behavior is normal. Judgment and thought content normal.    Data Reviewed  U/S abdomen normal   Assessment    5 mm umbilical hernia   Minimally symptomatic   Mild abdominal diastasis    Plan    Patient would like to watch for now. She is able to exercise with minimal discomfort. If becomes more symptomatic she will contact me and we will arrange for repair of her umbilical hernia.  Encourage weight loss.       Misty Mahoney A. 03/24/2013, 11:25 AM

## 2013-04-08 ENCOUNTER — Encounter: Payer: Self-pay | Admitting: Internal Medicine

## 2013-04-10 ENCOUNTER — Other Ambulatory Visit: Payer: Self-pay | Admitting: Cardiology

## 2013-04-10 MED ORDER — METOPROLOL SUCCINATE ER 25 MG PO TB24: 25.0000 mg | ORAL_TABLET | Freq: Every day | ORAL | Status: DC

## 2013-04-11 ENCOUNTER — Other Ambulatory Visit: Payer: Self-pay | Admitting: *Deleted

## 2013-04-11 MED ORDER — METOPROLOL SUCCINATE ER 25 MG PO TB24: 25.0000 mg | ORAL_TABLET | Freq: Every day | ORAL | Status: DC

## 2013-05-07 ENCOUNTER — Other Ambulatory Visit: Payer: Self-pay | Admitting: *Deleted

## 2013-05-07 MED ORDER — DILTIAZEM HCL ER COATED BEADS 180 MG PO CP24: 180.0000 mg | ORAL_CAPSULE | Freq: Every day | ORAL | Status: DC

## 2013-05-08 ENCOUNTER — Other Ambulatory Visit: Payer: Self-pay | Admitting: *Deleted

## 2013-05-08 MED ORDER — DILTIAZEM HCL ER COATED BEADS 180 MG PO CP24: 180.0000 mg | ORAL_CAPSULE | Freq: Every day | ORAL | Status: DC

## 2013-05-12 ENCOUNTER — Other Ambulatory Visit: Payer: Self-pay | Admitting: *Deleted

## 2013-05-12 MED ORDER — DILTIAZEM HCL ER COATED BEADS 180 MG PO CP24: 180.0000 mg | ORAL_CAPSULE | Freq: Every day | ORAL | Status: DC

## 2013-06-02 ENCOUNTER — Telehealth: Payer: Self-pay | Admitting: Internal Medicine

## 2013-06-02 NOTE — Telephone Encounter (Signed)
lmom for patient to call me tomorrow.  If she feels she needs to go to the ER tonight she may do so

## 2013-06-02 NOTE — Telephone Encounter (Signed)
New problem    C/o afib for 7 hrs now.  S/p ablation in past.

## 2013-06-03 ENCOUNTER — Encounter: Payer: Self-pay | Admitting: *Deleted

## 2013-06-03 NOTE — Telephone Encounter (Signed)
Discussed with both Dr Ladona Ridgel and Dr Graciela Husbands.  She has had episodes of afib longest yesterday lasting 8 hours, one in May lasting 6 hours, occasional burst 7-8 times since ablation lasting < 30 min.  She feels "crapy" when this is going on and has had to miss work.  She does have some strips of the afib and will bring with her.  We are going to arrange for her to see Dr Graciela Husbands soon.  Melissa will call her with an appointment

## 2013-06-05 ENCOUNTER — Ambulatory Visit (INDEPENDENT_AMBULATORY_CARE_PROVIDER_SITE_OTHER): Payer: 59 | Admitting: Internal Medicine

## 2013-06-05 ENCOUNTER — Encounter: Payer: Self-pay | Admitting: Internal Medicine

## 2013-06-05 ENCOUNTER — Other Ambulatory Visit: Payer: Self-pay | Admitting: *Deleted

## 2013-06-05 VITALS — BP 148/75 | HR 62 | Ht 68.0 in | Wt 240.0 lb

## 2013-06-05 DIAGNOSIS — I4891 Unspecified atrial fibrillation: Secondary | ICD-10-CM

## 2013-06-05 MED ORDER — APIXABAN 5 MG PO TABS: 5.0000 mg | ORAL_TABLET | Freq: Two times a day (BID) | ORAL | Status: DC

## 2013-06-05 MED ORDER — FLECAINIDE ACETATE 100 MG PO TABS: ORAL_TABLET | ORAL | Status: DC

## 2013-06-05 NOTE — Progress Notes (Signed)
Patient Care Team: Darnelle Bos, MD as PCP - General (Internal Medicine)   HPI  Misty Mahoney is a 47 y.o. female Seen s/p RFCA of AVNRT 11/13 now with complaintees of atrial fibrillation  Initially she had episodes following her SVT and the hope had been it was secondary.  Unfortunately, she has continued to have recurrences of up to 8 hours. Thankfully she brings in documentation of that today.it is with a rapid ventricular rate at 133.it occurred in the context of having had alcohol of about 36 hours before.  She is mostly compliant with her CPAP. He does have hypertension. Her CHADS2  Score is one with a CHADS-VASc score of 2  Past Medical History  Diagnosis Date  . SVT (supraventricular tachycardia)     s/p RFCA 09/26/12  . Hypertension   . Atrial fibrillation   . OSA (obstructive sleep apnea)     Past Surgical History  Procedure Laterality Date  . Cardiac electrophysiology study and ablation    . Tonsillectomy      Current Outpatient Prescriptions  Medication Sig Dispense Refill  . acetaminophen (TYLENOL) 325 MG tablet Take 650 mg by mouth every 6 (six) hours as needed. For pain      . diltiazem (CARDIZEM CD) 180 MG 24 hr capsule Take 1 capsule (180 mg total) by mouth daily.  90 capsule  3  . ibuprofen (ADVIL,MOTRIN) 200 MG tablet Take 200 mg by mouth every 6 (six) hours as needed. For pain      . metoprolol succinate (TOPROL-XL) 25 MG 24 hr tablet Take 1 tablet (25 mg total) by mouth daily.  90 tablet  1  . valsartan-hydrochlorothiazide (DIOVAN-HCT) 320-12.5 MG per tablet Take 1 tablet by mouth daily.       No current facility-administered medications for this visit.    No Known Allergies  Review of Systems negative except from HPI and PMH  Physical Exam BP 148/75  Pulse 62  Ht 5\' 8"  (1.727 m)  Wt 240 lb (108.863 kg)  BMI 36.5 kg/m2 Well developed and nourished in no acute distress HENT normal Neck supple with JVP-flat Clear Regular rate and  rhythm, no murmurs or gallops Abd-soft with active BS No Clubbing cyanosis edema Skin-warm and dry A & Oriented  Grossly normal sensory and motor function    ECG demonstrates sinus rhythm at 62 Intervals 15/10/42 Axis is 24   nonspecific ST changes  This 12-lead of atrial fibrillation that is unDated demonstrates atrial fibrillation at 133    Assessment and  Plan

## 2013-06-05 NOTE — Patient Instructions (Addendum)
Your physician has requested that you have an echocardiogram. Echocardiography is a painless test that uses sound waves to create images of your heart. It provides your doctor with information about the size and shape of your heart and how well your heart's chambers and valves are working. This procedure takes approximately one hour. There are no restrictions for this procedure.  Your physician has recommended you make the following change in your medication:  1) you have been given an order for flecainide to take to the hospital  2) start Eliquis 5mg  one tablet by mouth twice daily.  Your physician wants you to follow-up in: 4 months with Dr. Graciela Husbands. You will receive a reminder letter in the mail two months in advance. If you don't receive a letter, please call our office to schedule the follow-up appointment.

## 2013-06-05 NOTE — Assessment & Plan Note (Signed)
Misty Mahoney has atrial fibrillation that persists following catheter ablation of her AV node reentry circuit. It is relatively rapid but not terribly tolerated. We have discussed coming in after 24 hours if it persists. She is given a note to the emergency room could give her flecainide 300 mg see if we could demonstrate the effectiveness of a "pill in the pocket" approach.   In addition, we have discussed the importance of anticoagulationwith her CHADS-VASc score of 2 and her CHADS2 score of one. She asked about aspirin. We reviewed the data from the [place] trial my recommendation that she use apixaban as opposed to aspirin.  We also discussed the potential role of catheter ablation as an alternative for long-term anticoagulation.  She is compliant with her CPAP. She will continue with her antihypertensives.  We reviewed her echo from November 2013. Her left atrial dimension was 45. We will repeat the echo and reassess left atrial dimension as it will inform potentially treatment time

## 2013-06-11 ENCOUNTER — Ambulatory Visit (HOSPITAL_COMMUNITY): Payer: 59 | Attending: Cardiology | Admitting: Radiology

## 2013-06-11 DIAGNOSIS — I471 Supraventricular tachycardia: Secondary | ICD-10-CM

## 2013-06-11 DIAGNOSIS — I4891 Unspecified atrial fibrillation: Secondary | ICD-10-CM | POA: Insufficient documentation

## 2013-06-11 DIAGNOSIS — I10 Essential (primary) hypertension: Secondary | ICD-10-CM | POA: Insufficient documentation

## 2013-06-11 DIAGNOSIS — E669 Obesity, unspecified: Secondary | ICD-10-CM | POA: Insufficient documentation

## 2013-06-11 NOTE — Progress Notes (Signed)
Echocardiogram performed.  

## 2013-09-25 ENCOUNTER — Other Ambulatory Visit: Payer: Self-pay

## 2014-04-07 ENCOUNTER — Ambulatory Visit (INDEPENDENT_AMBULATORY_CARE_PROVIDER_SITE_OTHER): Payer: 59 | Admitting: Family Medicine

## 2014-04-07 ENCOUNTER — Encounter: Payer: Self-pay | Admitting: Family Medicine

## 2014-04-07 VITALS — BP 140/86 | HR 73 | Temp 98.4°F | Ht 68.0 in | Wt 231.1 lb

## 2014-04-07 DIAGNOSIS — I10 Essential (primary) hypertension: Secondary | ICD-10-CM

## 2014-04-07 DIAGNOSIS — O24410 Gestational diabetes mellitus in pregnancy, diet controlled: Secondary | ICD-10-CM

## 2014-04-07 DIAGNOSIS — E669 Obesity, unspecified: Secondary | ICD-10-CM

## 2014-04-07 DIAGNOSIS — I4891 Unspecified atrial fibrillation: Secondary | ICD-10-CM

## 2014-04-07 DIAGNOSIS — R739 Hyperglycemia, unspecified: Secondary | ICD-10-CM

## 2014-04-07 DIAGNOSIS — O99810 Abnormal glucose complicating pregnancy: Secondary | ICD-10-CM

## 2014-04-07 DIAGNOSIS — D649 Anemia, unspecified: Secondary | ICD-10-CM

## 2014-04-07 DIAGNOSIS — G4733 Obstructive sleep apnea (adult) (pediatric): Secondary | ICD-10-CM

## 2014-04-07 DIAGNOSIS — Z8619 Personal history of other infectious and parasitic diseases: Secondary | ICD-10-CM | POA: Insufficient documentation

## 2014-04-07 DIAGNOSIS — I498 Other specified cardiac arrhythmias: Secondary | ICD-10-CM

## 2014-04-07 DIAGNOSIS — R7309 Other abnormal glucose: Secondary | ICD-10-CM

## 2014-04-07 DIAGNOSIS — Z0000 Encounter for general adult medical examination without abnormal findings: Secondary | ICD-10-CM

## 2014-04-07 DIAGNOSIS — I471 Supraventricular tachycardia: Secondary | ICD-10-CM

## 2014-04-07 DIAGNOSIS — E559 Vitamin D deficiency, unspecified: Secondary | ICD-10-CM

## 2014-04-07 MED ORDER — VALSARTAN-HYDROCHLOROTHIAZIDE 320-12.5 MG PO TABS: 1.0000 | ORAL_TABLET | Freq: Every day | ORAL | Status: DC

## 2014-04-07 MED ORDER — DILTIAZEM HCL ER COATED BEADS 180 MG PO CP24: 180.0000 mg | ORAL_CAPSULE | Freq: Every day | ORAL | Status: DC

## 2014-04-07 NOTE — Progress Notes (Signed)
Pre visit review using our clinic review tool, if applicable. No additional management support is needed unless otherwise documented below in the visit note. 

## 2014-04-07 NOTE — Patient Instructions (Signed)
DASH Diet  The DASH diet stands for "Dietary Approaches to Stop Hypertension." It is a healthy eating plan that has been shown to reduce high blood pressure (hypertension) in as little as 14 days, while also possibly providing other significant health benefits. These other health benefits include reducing the risk of breast cancer after menopause and reducing the risk of type 2 diabetes, heart disease, colon cancer, and stroke. Health benefits also include weight loss and slowing kidney failure in patients with chronic kidney disease.   DIET GUIDELINES  · Limit salt (sodium). Your diet should contain less than 1500 mg of sodium daily.  · Limit refined or processed carbohydrates. Your diet should include mostly whole grains. Desserts and added sugars should be used sparingly.  · Include small amounts of heart-healthy fats. These types of fats include nuts, oils, and tub margarine. Limit saturated and trans fats. These fats have been shown to be harmful in the body.  CHOOSING FOODS   The following food groups are based on a 2000 calorie diet. See your Registered Dietitian for individual calorie needs.  Grains and Grain Products (6 to 8 servings daily)  · Eat More Often: Whole-wheat bread, brown rice, whole-grain or wheat pasta, quinoa, popcorn without added fat or salt (air popped).  · Eat Less Often: White bread, white pasta, white rice, cornbread.  Vegetables (4 to 5 servings daily)  · Eat More Often: Fresh, frozen, and canned vegetables. Vegetables may be raw, steamed, roasted, or grilled with a minimal amount of fat.  · Eat Less Often/Avoid: Creamed or fried vegetables. Vegetables in a cheese sauce.  Fruit (4 to 5 servings daily)  · Eat More Often: All fresh, canned (in natural juice), or frozen fruits. Dried fruits without added sugar. One hundred percent fruit juice (½ cup [237 mL] daily).  · Eat Less Often: Dried fruits with added sugar. Canned fruit in light or heavy syrup.  Lean Meats, Fish, and Poultry (2  servings or less daily. One serving is 3 to 4 oz [85-114 g]).  · Eat More Often: Ninety percent or leaner ground beef, tenderloin, sirloin. Round cuts of beef, chicken breast, turkey breast. All fish. Grill, bake, or broil your meat. Nothing should be fried.  · Eat Less Often/Avoid: Fatty cuts of meat, turkey, or chicken leg, thigh, or wing. Fried cuts of meat or fish.  Dairy (2 to 3 servings)  · Eat More Often: Low-fat or fat-free milk, low-fat plain or light yogurt, reduced-fat or part-skim cheese.  · Eat Less Often/Avoid: Milk (whole, 2%). Whole milk yogurt. Full-fat cheeses.  Nuts, Seeds, and Legumes (4 to 5 servings per week)  · Eat More Often: All without added salt.  · Eat Less Often/Avoid: Salted nuts and seeds, canned beans with added salt.  Fats and Sweets (limited)  · Eat More Often: Vegetable oils, tub margarines without trans fats, sugar-free gelatin. Mayonnaise and salad dressings.  · Eat Less Often/Avoid: Coconut oils, palm oils, butter, stick margarine, cream, half and half, cookies, candy, pie.  FOR MORE INFORMATION  The Dash Diet Eating Plan: www.dashdiet.org  Document Released: 10/26/2011 Document Revised: 01/29/2012 Document Reviewed: 10/26/2011  ExitCare® Patient Information ©2014 [employer].

## 2014-04-10 ENCOUNTER — Encounter: Payer: Self-pay | Admitting: Family Medicine

## 2014-04-10 DIAGNOSIS — D649 Anemia, unspecified: Secondary | ICD-10-CM | POA: Insufficient documentation

## 2014-04-10 DIAGNOSIS — Z8632 Personal history of gestational diabetes: Secondary | ICD-10-CM | POA: Insufficient documentation

## 2014-04-10 DIAGNOSIS — E669 Obesity, unspecified: Secondary | ICD-10-CM

## 2014-04-10 DIAGNOSIS — O24410 Gestational diabetes mellitus in pregnancy, diet controlled: Secondary | ICD-10-CM

## 2014-04-10 HISTORY — DX: Obesity, unspecified: E66.9

## 2014-04-10 HISTORY — DX: Gestational diabetes mellitus in pregnancy, diet controlled: O24.410

## 2014-04-10 NOTE — Progress Notes (Signed)
Patient ID: Misty Mahoney, female   DOB: 05-17-1966, 48 y.o.   MRN: [id number] BRITTNEE GAETANO [id number] 1966-02-17 04/10/2014      Progress Note New Patient  Subjective  Chief Complaint  Chief Complaint  Patient presents with  . Establish Care    new patient transfer from Dr Isidoro Donning    HPI  Patient is a 48 year old female in today for routine medical care. She is here today to establish care. She feels well today. She has a complicated past medical history. Has history of SVT and atrial fibrillation. Underwent ablation in November 2013 at this point has very  episodes of SVT. They're short lived and without associated symptoms. No recent illness. Denies CP/palp/SOB/HA/congestion/fevers/GI or GU c/o. Taking meds as prescribed  Past Medical History  Diagnosis Date  . SVT (supraventricular tachycardia)     s/p RFCA 09/26/12  . Hypertension   . Atrial fibrillation   . OSA (obstructive sleep apnea)   . Chicken pox as a child  . Anemia     gestational  . DM, gestational, diet controlled 04/10/2014  . Obesity, unspecified 04/10/2014    Past Surgical History  Procedure Laterality Date  . Cardiac electrophysiology study and ablation  2013  . Tonsillectomy  48 yrs old  . Wisdom tooth extraction  48 yrs old  . Adenoidectomy    . Palate / uvula biopsy / excision      Family History  Problem Relation Age of Onset  . Hypertension Mother   . Dementia Father   . Hypertension Father   . Cataracts Father     bilateral  . Cancer Sister 29    breast- recurrence  . Hypertension Brother   . ADD / ADHD Daughter   . ADD / ADHD Son   . Heart attack Maternal Grandfather     pacemaker  . Cancer Maternal Grandfather     ?  Marland Kitchen Cancer Paternal Grandmother     colon    History   Social History  . Marital Status: Legally Separated    Spouse Name: N/A    Number of Children: N/A  . Years of Education: N/A   Occupational History  . Not on file.   Social History Main Topics  .  Smoking status: Never Smoker   . Smokeless tobacco: Never Used  . Alcohol Use: Yes     Comment: two or three times a year  . Drug Use: No  . Sexual Activity: Yes    Partners: Male     Comment: lives with kids, significant other and step daughter. minimizing dairy and gluten, exercise   Other Topics Concern  . Not on file   Social History Narrative  . No narrative on file    Current Outpatient Prescriptions on File Prior to Visit  Medication Sig Dispense Refill  . acetaminophen (TYLENOL) 325 MG tablet Take 650 mg by mouth every 6 (six) hours as needed. For pain      . flecainide (TAMBOCOR) 150 MG tablet Take 300 mg x one dose as directed per MD (order given to patient for the ER)      . ibuprofen (ADVIL,MOTRIN) 200 MG tablet Take 200 mg by mouth every 6 (six) hours as needed. For pain       No current facility-administered medications on file prior to visit.    No Known Allergies  Review of Systems  Review of Systems  Constitutional: Negative for fever, chills and malaise/fatigue.  HENT: Negative for  congestion, hearing loss and nosebleeds.   Eyes: Negative for discharge.  Respiratory: Negative for cough, sputum production, shortness of breath and wheezing.   Cardiovascular: Positive for palpitations. Negative for chest pain and leg swelling.  Gastrointestinal: Negative for heartburn, nausea, vomiting, abdominal pain, diarrhea, constipation and blood in stool.  Genitourinary: Negative for dysuria, urgency, frequency and hematuria.  Musculoskeletal: Negative for back pain, falls and myalgias.  Skin: Negative for rash.  Neurological: Negative for dizziness, tremors, sensory change, focal weakness, loss of consciousness, weakness and headaches.  Endo/Heme/Allergies: Negative for polydipsia. Does not bruise/bleed easily.  Psychiatric/Behavioral: Negative for depression and suicidal ideas. The patient is not nervous/anxious and does not have insomnia.     Objective  BP 140/86   Pulse 73  Temp(Src) 98.4 F (36.9 C) (Oral)  Ht 5\' 8"  (1.727 m)  Wt 231 lb 1.9 oz (104.835 kg)  BMI 35.15 kg/m2  SpO2 98%  LMP 03/17/2014  Physical Exam  Physical Exam  Constitutional: She is oriented to person, place, and time and well-developed, well-nourished, and in no distress. No distress.  HENT:  Head: Normocephalic and atraumatic.  Right Ear: External ear normal.  Left Ear: External ear normal.  Nose: Nose normal.  Mouth/Throat: Oropharynx is clear and moist. No oropharyngeal exudate.  Eyes: Conjunctivae are normal. Pupils are equal, round, and reactive to light. Right eye exhibits no discharge. Left eye exhibits no discharge. No scleral icterus.  Neck: Normal range of motion. Neck supple. No thyromegaly present.  Cardiovascular: Normal rate, regular rhythm, normal heart sounds and intact distal pulses.   No murmur heard. Pulmonary/Chest: Effort normal and breath sounds normal. No respiratory distress. She has no wheezes. She has no rales.  Abdominal: Soft. Bowel sounds are normal. She exhibits no distension and no mass. There is no tenderness.  Musculoskeletal: Normal range of motion. She exhibits no edema and no tenderness.  Lymphadenopathy:    She has no cervical adenopathy.  Neurological: She is alert and oriented to person, place, and time. She has normal reflexes. No cranial nerve deficit. Coordination normal.  Skin: Skin is warm and dry. No rash noted. She is not diaphoretic.  Psychiatric: Mood, memory and affect normal.       Assessment & Plan  Hypertension Well controlled, no changes to meds. Encouraged heart healthy diet such as the DASH diet and exercise as tolerated. Given refill today  Anemia Repeat cbc  DM, gestational, diet controlled Check glucose with lab draw, minimize simple carbs  Atrial fibrillation RRR today, well controlled on current meds  SVT (supraventricular tachycardia) Patient reports very infrequent episodes at this time.  Minimally symptomatic  OSA (obstructive sleep apnea) Follows with pulmonology, compliant per pulmonology  Obesity, unspecified Encouraged DASH diet, decrease po intake and increase exercise as tolerated. Needs 7-8 hours of sleep nightly. Avoid trans fats, eat small, frequent meals every 4-5 hours with lean proteins, complex carbs and healthy fats. Minimize simple carbs, GMO foods.

## 2014-04-10 NOTE — Assessment & Plan Note (Signed)
Check glucose with lab draw, minimize simple carbs

## 2014-04-10 NOTE — Assessment & Plan Note (Signed)
Repeat cbc

## 2014-04-10 NOTE — Assessment & Plan Note (Signed)
Well controlled, no changes to meds. Encouraged heart healthy diet such as the DASH diet and exercise as tolerated. Given refill today

## 2014-04-10 NOTE — Assessment & Plan Note (Signed)
Patient reports very infrequent episodes at this time. Minimally symptomatic

## 2014-04-10 NOTE — Assessment & Plan Note (Signed)
Follows with pulmonology, compliant per pulmonology

## 2014-04-10 NOTE — Assessment & Plan Note (Signed)
RRR today, well controlled on current meds

## 2014-04-10 NOTE — Assessment & Plan Note (Signed)
Encouraged DASH diet, decrease po intake and increase exercise as tolerated. Needs 7-8 hours of sleep nightly. Avoid trans fats, eat small, frequent meals every 4-5 hours with lean proteins, complex carbs and healthy fats. Minimize simple carbs, GMO foods. 

## 2014-05-14 ENCOUNTER — Telehealth: Payer: Self-pay | Admitting: Family Medicine

## 2014-05-14 ENCOUNTER — Other Ambulatory Visit: Payer: Self-pay | Admitting: Family Medicine

## 2014-05-14 NOTE — Telephone Encounter (Signed)
Pt states that there is suppose to be a new order for mask for her cpap, it is not at the front desk. (original was wrote wrong) Please advise.

## 2014-05-14 NOTE — Telephone Encounter (Signed)
Not sure what she needs, Have hand written a prescription for CPAP mask, will be at front desk if that is not what she is after she will have to be more explicit about what she needs.

## 2014-05-18 NOTE — Telephone Encounter (Signed)
LMOVM for patient to return my call

## 2014-06-04 LAB — CBC
HCT: 34.1 % — ABNORMAL LOW (ref 36.0–46.0)
Hemoglobin: 11.2 g/dL — ABNORMAL LOW (ref 12.0–15.0)
MCH: 27.8 pg (ref 26.0–34.0)
MCHC: 32.8 g/dL (ref 30.0–36.0)
MCV: 84.6 fL (ref 78.0–100.0)
PLATELETS: 289 10*3/uL (ref 150–400)
RBC: 4.03 MIL/uL (ref 3.87–5.11)
RDW: 14.3 % (ref 11.5–15.5)
WBC: 7 10*3/uL (ref 4.0–10.5)

## 2014-06-04 LAB — LIPID PANEL
CHOLESTEROL: 170 mg/dL (ref 0–200)
HDL: 55 mg/dL (ref >39)
LDL Cholesterol: 90 mg/dL (ref 0–99)
TRIGLYCERIDES: 126 mg/dL (ref <150)
Total CHOL/HDL Ratio: 3.1 Ratio
VLDL: 25 mg/dL (ref 0–40)

## 2014-06-04 LAB — RENAL FUNCTION PANEL
ALBUMIN: 3.8 g/dL (ref 3.5–5.2)
BUN: 19 mg/dL (ref 6–23)
CALCIUM: 8.9 mg/dL (ref 8.4–10.5)
CO2: 25 mEq/L (ref 19–32)
Chloride: 106 mEq/L (ref 96–112)
Creat: 0.59 mg/dL (ref 0.50–1.10)
GLUCOSE: 100 mg/dL — AB (ref 70–99)
Phosphorus: 3.4 mg/dL (ref 2.3–4.6)
Potassium: 4.4 mEq/L (ref 3.5–5.3)
SODIUM: 138 meq/L (ref 135–145)

## 2014-06-04 LAB — VITAMIN D 25 HYDROXY (VIT D DEFICIENCY, FRACTURES): Vit D, 25-Hydroxy: 36 ng/mL (ref 30–89)

## 2014-06-04 LAB — HEPATIC FUNCTION PANEL
ALT: 12 U/L (ref 0–35)
AST: 14 U/L (ref 0–37)
Albumin: 3.8 g/dL (ref 3.5–5.2)
Alkaline Phosphatase: 41 U/L (ref 39–117)
BILIRUBIN DIRECT: 0.1 mg/dL (ref 0.0–0.3)
Indirect Bilirubin: 0.3 mg/dL (ref 0.2–1.2)
TOTAL PROTEIN: 6.8 g/dL (ref 6.0–8.3)
Total Bilirubin: 0.4 mg/dL (ref 0.2–1.2)

## 2014-06-04 LAB — TSH: TSH: 1.949 u[IU]/mL (ref 0.350–4.500)

## 2014-06-04 LAB — T4, FREE: FREE T4: 1.25 ng/dL (ref 0.80–1.80)

## 2014-06-09 ENCOUNTER — Encounter: Payer: Self-pay | Admitting: Family Medicine

## 2014-06-09 NOTE — Telephone Encounter (Signed)
Please advise? It looks like labs are back but I can't tell if anyone has looked at them? thanks

## 2014-06-16 NOTE — Telephone Encounter (Signed)
Can you please try to contact this patient and get the information that Dr Charlett Blake needs?

## 2014-07-23 ENCOUNTER — Telehealth: Payer: Self-pay | Admitting: Family Medicine

## 2014-07-23 MED ORDER — DILTIAZEM HCL ER COATED BEADS 180 MG PO CP24: 180.0000 mg | ORAL_CAPSULE | Freq: Every day | ORAL | Status: DC

## 2014-07-23 MED ORDER — VALSARTAN-HYDROCHLOROTHIAZIDE 320-12.5 MG PO TABS: 1.0000 | ORAL_TABLET | Freq: Every day | ORAL | Status: DC

## 2014-07-23 NOTE — Telephone Encounter (Signed)
Caller name: Sihaam  Call back [phone] Pharmacy : [facility]   Reason for call:  Pt has switched insurance to [facility], and is needing scripts resent to pharmacy listed above.   diltiazem (CARDIZEM CD) 180 MG  valsartan-hydrochlorothiazide (DIOVAN-HCT) 320-12.5 MG per  Pt is needing these with 90 day supply with 3 refills.

## 2014-08-31 ENCOUNTER — Telehealth: Payer: Self-pay | Admitting: *Deleted

## 2014-08-31 NOTE — Telephone Encounter (Signed)
08/31/2014  Pt prescription for CPAP mask and tubing dated 04/07/2014 was not picked up by patient, shredded 08/31/2014.  Copy sent to be scanned in chart.

## 2014-09-03 ENCOUNTER — Telehealth: Payer: Self-pay | Admitting: Internal Medicine

## 2014-09-03 NOTE — Telephone Encounter (Signed)
New message    Patient calling going to er to get covert to Afib & patient couldn't remember what medication she need to tell them to take for her afib.

## 2014-09-03 NOTE — Telephone Encounter (Signed)
Returned call to pt who reports she has found prescription for flecainide that Dr. Caryl Comes gave her to take to ER if she went into atrial fib again. Pt reports she has no additional questions but is going to ED at [facility] as she thinks she is in atrial fib.

## 2014-09-04 ENCOUNTER — Other Ambulatory Visit: Payer: Self-pay

## 2014-10-29 ENCOUNTER — Encounter (HOSPITAL_COMMUNITY): Payer: Self-pay | Admitting: Internal Medicine

## 2015-04-22 ENCOUNTER — Other Ambulatory Visit: Payer: Self-pay | Admitting: Obstetrics and Gynecology

## 2015-04-22 DIAGNOSIS — N6452 Nipple discharge: Secondary | ICD-10-CM

## 2015-04-26 ENCOUNTER — Ambulatory Visit
Admission: RE | Admit: 2015-04-26 | Discharge: 2015-04-26 | Disposition: A | Discharge status: Home / Self Care | Payer: 59 | Source: Ambulatory Visit | Attending: Obstetrics and Gynecology | Admitting: Obstetrics and Gynecology

## 2015-04-26 DIAGNOSIS — N6452 Nipple discharge: Secondary | ICD-10-CM

## 2015-05-05 ENCOUNTER — Other Ambulatory Visit: Payer: Self-pay | Admitting: Family Medicine

## 2015-05-05 NOTE — Telephone Encounter (Signed)
It has been over a year since I saw her so I will refill for a short time but she needs an appt to monitor or she can try and get refills from cardiology

## 2015-05-17 ENCOUNTER — Other Ambulatory Visit: Payer: Self-pay

## 2015-05-25 ENCOUNTER — Other Ambulatory Visit: Payer: Self-pay | Admitting: General Surgery

## 2015-05-25 DIAGNOSIS — N6011 Diffuse cystic mastopathy of right breast: Secondary | ICD-10-CM

## 2015-05-25 DIAGNOSIS — D241 Benign neoplasm of right breast: Secondary | ICD-10-CM

## 2015-06-15 ENCOUNTER — Ambulatory Visit (INDEPENDENT_AMBULATORY_CARE_PROVIDER_SITE_OTHER): Payer: 59 | Admitting: Family Medicine

## 2015-06-15 ENCOUNTER — Encounter: Payer: Self-pay | Admitting: Family Medicine

## 2015-06-15 VITALS — BP 128/51 | HR 73 | Temp 98.5°F | Ht 68.0 in | Wt 229.2 lb

## 2015-06-15 DIAGNOSIS — Z8632 Personal history of gestational diabetes: Secondary | ICD-10-CM

## 2015-06-15 DIAGNOSIS — D509 Iron deficiency anemia, unspecified: Secondary | ICD-10-CM

## 2015-06-15 DIAGNOSIS — F32A Depression, unspecified: Secondary | ICD-10-CM

## 2015-06-15 DIAGNOSIS — I480 Paroxysmal atrial fibrillation: Secondary | ICD-10-CM

## 2015-06-15 DIAGNOSIS — F329 Major depressive disorder, single episode, unspecified: Secondary | ICD-10-CM

## 2015-06-15 DIAGNOSIS — I10 Essential (primary) hypertension: Secondary | ICD-10-CM | POA: Diagnosis not present

## 2015-06-15 DIAGNOSIS — G4733 Obstructive sleep apnea (adult) (pediatric): Secondary | ICD-10-CM

## 2015-06-15 HISTORY — DX: Depression, unspecified: F32.A

## 2015-06-15 LAB — COMPREHENSIVE METABOLIC PANEL
ALK PHOS: 44 U/L (ref 39–117)
ALT: 13 U/L (ref 0–35)
AST: 15 U/L (ref 0–37)
Albumin: 4 g/dL (ref 3.5–5.2)
BILIRUBIN TOTAL: 0.4 mg/dL (ref 0.2–1.2)
BUN: 23 mg/dL (ref 6–23)
CHLORIDE: 103 meq/L (ref 96–112)
CO2: 26 mEq/L (ref 19–32)
CREATININE: 0.62 mg/dL (ref 0.40–1.20)
Calcium: 9.3 mg/dL (ref 8.4–10.5)
GFR: 108.29 mL/min (ref >60.00)
Glucose, Bld: 99 mg/dL (ref 70–99)
POTASSIUM: 4 meq/L (ref 3.5–5.1)
Sodium: 138 mEq/L (ref 135–145)
TOTAL PROTEIN: 7.8 g/dL (ref 6.0–8.3)

## 2015-06-15 LAB — VITAMIN D 25 HYDROXY (VIT D DEFICIENCY, FRACTURES): VITD: 19.94 ng/mL — ABNORMAL LOW (ref 30.00–100.00)

## 2015-06-15 LAB — LIPID PANEL
Cholesterol: 177 mg/dL (ref 0–200)
HDL: 50.2 mg/dL (ref >39.00)
LDL Cholesterol: 109 mg/dL — ABNORMAL HIGH (ref 0–99)
NONHDL: 126.8
TRIGLYCERIDES: 91 mg/dL (ref 0.0–149.0)
Total CHOL/HDL Ratio: 4
VLDL: 18.2 mg/dL (ref 0.0–40.0)

## 2015-06-15 LAB — CBC
HEMATOCRIT: 35.6 % — AB (ref 36.0–46.0)
HEMOGLOBIN: 11.9 g/dL — AB (ref 12.0–15.0)
MCHC: 33.5 g/dL (ref 30.0–36.0)
MCV: 85.6 fl (ref 78.0–100.0)
Platelets: 273 10*3/uL (ref 150.0–400.0)
RBC: 4.16 Mil/uL (ref 3.87–5.11)
RDW: 13.9 % (ref 11.5–15.5)
WBC: 8.4 10*3/uL (ref 4.0–10.5)

## 2015-06-15 LAB — HEMOGLOBIN A1C: Hgb A1c MFr Bld: 5.6 % (ref 4.6–6.5)

## 2015-06-15 LAB — TSH: TSH: 2.15 u[IU]/mL (ref 0.35–4.50)

## 2015-06-15 MED ORDER — VALSARTAN-HYDROCHLOROTHIAZIDE 320-12.5 MG PO TABS: 1.0000 | ORAL_TABLET | Freq: Every day | ORAL | Status: DC

## 2015-06-15 MED ORDER — DILTIAZEM HCL ER COATED BEADS 180 MG PO CP24: 180.0000 mg | ORAL_CAPSULE | Freq: Every day | ORAL | Status: DC

## 2015-06-15 NOTE — Progress Notes (Signed)
Misty Mahoney  [id number] [birth year] 06/15/2015      Progress Note-Follow Up  Subjective  Chief Complaint  Chief Complaint  Patient presents with  . Follow-up    HPI  Patient is a 49 y.o. female in today for routine medical care. Patient is in today for medication refills. We have not seen her in roughly a year. Physically she is doing fairly well but unfortunately has suffered some losses recently. Heard father died last month. She is also struggling with his sister with metastatic cancer. She technologist some anhedonia and lethargy as a result. She has struggled with depression and used antidepressives in the past but feels overall she is managing fairly well. No physical illness or fevers recently noted. She has had 2 maybe 3 episodes of feeling slightly lightheaded recently. It passes quickly and there are no associated symptoms such as nausea, diaphoresis, palpitations or chest pain associated with these episodes. Sometimes there with position changes and sometimes they occur randomly. Denies CP/palp/SOB/HA/congestion/fevers/GI or GU c/o. Taking meds as prescribed  Past Medical History  Diagnosis Date  . SVT (supraventricular tachycardia)     s/p RFCA 09/26/12  . Hypertension   . Atrial fibrillation   . OSA (obstructive sleep apnea)   . Chicken pox as a child  . Anemia     gestational  . DM, gestational, diet controlled 04/10/2014  . Obesity, unspecified 04/10/2014  . H/O gestational diabetes mellitus, not currently pregnant 04/10/2014  . History of chicken pox   . Depression 06/15/2015    Past Surgical History  Procedure Laterality Date  . Cardiac electrophysiology study and ablation  2013  . Tonsillectomy  49 yrs old  . Wisdom tooth extraction  49 yrs old  . Adenoidectomy    . Palate / uvula biopsy / excision    . V-tach ablation N/A 09/26/2012    Procedure: V-TACH ABLATION;  Surgeon: Evans Lance, MD;  Location: [facility] CATH LAB;  Service: Cardiovascular;  Laterality:  N/A;    Family History  Problem Relation Age of Onset  . Hypertension Mother   . Dementia Father   . Hypertension Father   . Cataracts Father     bilateral  . Cancer Sister 62    breast- recurrence  . Hypertension Brother   . ADD / ADHD Daughter   . ADD / ADHD Son   . Heart attack Maternal Grandfather     pacemaker  . Cancer Maternal Grandfather     ?  Marland Kitchen Cancer Paternal Grandmother     colon    History   Social History  . Marital Status: Legally Separated    Spouse Name: N/A  . Number of Children: N/A  . Years of Education: N/A   Occupational History  . Not on file.   Social History Main Topics  . Smoking status: Never Smoker   . Smokeless tobacco: Never Used  . Alcohol Use: Yes     Comment: two or three times a year  . Drug Use: No  . Sexual Activity:    Partners: Male     Comment: lives with kids, significant other and step daughter. minimizing dairy and gluten, exercise   Other Topics Concern  . Not on file   Social History Narrative    Current Outpatient Prescriptions on File Prior to Visit  Medication Sig Dispense Refill  . acetaminophen (TYLENOL) 325 MG tablet Take 650 mg by mouth every 6 (six) hours as needed. For pain    .  Cholecalciferol (VITAMIN D) 2000 UNITS CAPS Take by mouth daily.    Marland Kitchen CINNAMON PO Take by mouth daily.    Marland Kitchen ibuprofen (ADVIL,MOTRIN) 200 MG tablet Take 200 mg by mouth every 6 (six) hours as needed. For pain    . Omega-3 Fatty Acids (FISH OIL PO) Take by mouth daily.     No current facility-administered medications on file prior to visit.    No Known Allergies  Review of Systems  Review of Systems  Constitutional: Positive for malaise/fatigue. Negative for fever.  HENT: Negative for congestion.   Eyes: Negative for discharge.  Respiratory: Negative for shortness of breath.   Cardiovascular: Negative for chest pain, palpitations and leg swelling.  Gastrointestinal: Negative for nausea, abdominal pain and diarrhea.    Genitourinary: Negative for dysuria.  Musculoskeletal: Negative for falls.  Skin: Negative for rash.  Neurological: Negative for loss of consciousness and headaches.  Endo/Heme/Allergies: Negative for polydipsia.  Psychiatric/Behavioral: Positive for depression. Negative for suicidal ideas. The patient is not nervous/anxious and does not have insomnia.     Objective  BP 128/51 mmHg  Pulse 73  Temp(Src) 98.5 F (36.9 C) (Oral)  Ht 5\' 8"  (1.727 m)  Wt 229 lb 4 oz (103.987 kg)  BMI 34.87 kg/m2  SpO2 99%  Physical Exam  Physical Exam  Constitutional: She is oriented to person, place, and time and well-developed, well-nourished, and in no distress. No distress.  HENT:  Head: Normocephalic and atraumatic.  Eyes: Conjunctivae are normal.  Neck: Neck supple. No thyromegaly present.  Cardiovascular: Normal rate and regular rhythm.   Murmur heard. Pulmonary/Chest: Effort normal and breath sounds normal. She has no wheezes.  Abdominal: She exhibits no distension and no mass.  Musculoskeletal: She exhibits no edema.  Lymphadenopathy:    She has no cervical adenopathy.  Neurological: She is alert and oriented to person, place, and time.  Skin: Skin is warm and dry. No rash noted. She is not diaphoretic.  Psychiatric: Memory, affect and judgment normal.    Lab Results  Component Value Date   TSH 1.949 06/04/2014   Lab Results  Component Value Date   WBC 7.0 06/04/2014   HGB 11.2* 06/04/2014   HCT 34.1* 06/04/2014   MCV 84.6 06/04/2014   PLT 289 06/04/2014   Lab Results  Component Value Date   CREATININE 0.59 06/04/2014   BUN 19 06/04/2014   NA 138 06/04/2014   K 4.4 06/04/2014   CL 106 06/04/2014   CO2 25 06/04/2014   Lab Results  Component Value Date   ALT 12 06/04/2014   AST 14 06/04/2014   ALKPHOS 41 06/04/2014   BILITOT 0.4 06/04/2014   Lab Results  Component Value Date   CHOL 170 06/04/2014   Lab Results  Component Value Date   HDL 55 06/04/2014    Lab Results  Component Value Date   LDLCALC 90 06/04/2014   Lab Results  Component Value Date   TRIG 126 06/04/2014   Lab Results  Component Value Date   CHOLHDL 3.1 06/04/2014     Assessment & Plan  Obesity Encouraged DASH diet, decrease po intake and increase exercise as tolerated. Needs 7-8 hours of sleep nightly. Avoid trans fats, eat small, frequent meals every 4-5 hours with lean proteins, complex carbs and healthy fats. Minimize simple carbs  H/O gestational diabetes mellitus, not currently pregnant Is noting some recent incidents of feeling weak and light headed feelings, check a hgba1c   Atrial fibrillation No recent episodes, sees  cardiology soon  OSA (obstructive sleep apnea) Wears CPAP 5 of 7 days most weeks  Anemia Recheck cbc today. Increase leafy greens, consider increased lean red meat and using cast iron cookware. Continue to monitor, report any concerns  Hypertension Well controlled, no changes to meds. Encouraged heart healthy diet such as the DASH diet and exercise as tolerated.   Depression Grief reaction to father dying last month and sister with metastatic cancer. Declines SSRI today, has used in past may consider counseling, given paperwork for LB counseling.

## 2015-06-15 NOTE — Assessment & Plan Note (Signed)
Well controlled, no changes to meds. Encouraged heart healthy diet such as the DASH diet and exercise as tolerated.  °

## 2015-06-15 NOTE — Assessment & Plan Note (Signed)
Encouraged DASH diet, decrease po intake and increase exercise as tolerated. Needs 7-8 hours of sleep nightly. Avoid trans fats, eat small, frequent meals every 4-5 hours with lean proteins, complex carbs and healthy fats. Minimize simple carbs 

## 2015-06-15 NOTE — Progress Notes (Signed)
Pre visit review using our clinic review tool, if applicable. No additional management support is needed unless otherwise documented below in the visit note. 

## 2015-06-15 NOTE — Assessment & Plan Note (Signed)
No recent episodes, sees cardiology soon

## 2015-06-15 NOTE — Patient Instructions (Signed)
DASH Eating Plan °DASH stands for "Dietary Approaches to Stop Hypertension." The DASH eating plan is a healthy eating plan that has been shown to reduce high blood pressure (hypertension). Additional health benefits may include reducing the risk of type 2 diabetes mellitus, heart disease, and stroke. The DASH eating plan may also help with weight loss. °WHAT DO I NEED TO KNOW ABOUT THE DASH EATING PLAN? °For the DASH eating plan, you will follow these general guidelines: °· Choose foods with a percent daily value for sodium of less than 5% (as listed on the food label). °· Use salt-free seasonings or herbs instead of table salt or sea salt. °· Check with your health care provider or pharmacist before using salt substitutes. °· Eat lower-sodium products, often labeled as "lower sodium" or "no salt added." °· Eat fresh foods. °· Eat more vegetables, fruits, and low-fat dairy products. °· Choose whole grains. Look for the word "whole" as the first word in the ingredient list. °· Choose fish and skinless chicken or turkey more often than red meat. Limit fish, poultry, and meat to 6 oz (170 g) each day. °· Limit sweets, desserts, sugars, and sugary drinks. °· Choose heart-healthy fats. °· Limit cheese to 1 oz (28 g) per day. °· Eat more home-cooked food and less restaurant, buffet, and fast food. °· Limit fried foods. °· Cook foods using methods other than frying. °· Limit canned vegetables. If you do use them, rinse them well to decrease the sodium. °· When eating at a restaurant, ask that your food be prepared with less salt, or no salt if possible. °WHAT FOODS CAN I EAT? °Seek help from a dietitian for individual calorie needs. °Grains °Whole grain or whole wheat bread. Brown rice. Whole grain or whole wheat pasta. Quinoa, bulgur, and whole grain cereals. Low-sodium cereals. Corn or whole wheat flour tortillas. Whole grain cornbread. Whole grain crackers. Low-sodium crackers. °Vegetables °Fresh or frozen vegetables  (raw, steamed, roasted, or grilled). Low-sodium or reduced-sodium tomato and vegetable juices. Low-sodium or reduced-sodium tomato sauce and paste. Low-sodium or reduced-sodium canned vegetables.  °Fruits °All fresh, canned (in natural juice), or frozen fruits. °Meat and Other Protein Products °Ground beef (85% or leaner), grass-fed beef, or beef trimmed of fat. Skinless chicken or turkey. Ground chicken or turkey. Pork trimmed of fat. All fish and seafood. Eggs. Dried beans, peas, or lentils. Unsalted nuts and seeds. Unsalted canned beans. °Dairy °Low-fat dairy products, such as skim or 1% milk, 2% or reduced-fat cheeses, low-fat ricotta or cottage cheese, or plain low-fat yogurt. Low-sodium or reduced-sodium cheeses. °Fats and Oils °Tub margarines without trans fats. Light or reduced-fat mayonnaise and salad dressings (reduced sodium). Avocado. Safflower, olive, or canola oils. Natural peanut or almond butter. °Other °Unsalted popcorn and pretzels. °The items listed above may not be a complete list of recommended foods or beverages. Contact your dietitian for more options. °WHAT FOODS ARE NOT RECOMMENDED? °Grains °White bread. White pasta. White rice. Refined cornbread. Bagels and croissants. Crackers that contain trans fat. °Vegetables °Creamed or fried vegetables. Vegetables in a cheese sauce. Regular canned vegetables. Regular canned tomato sauce and paste. Regular tomato and vegetable juices. °Fruits °Dried fruits. Canned fruit in light or heavy syrup. Fruit juice. °Meat and Other Protein Products °Fatty cuts of meat. Ribs, chicken wings, bacon, sausage, bologna, salami, chitterlings, fatback, hot dogs, bratwurst, and packaged luncheon meats. Salted nuts and seeds. Canned beans with salt. °Dairy °Whole or 2% milk, cream, half-and-half, and cream cheese. Whole-fat or sweetened yogurt. Full-fat   cheeses or blue cheese. Nondairy creamers and whipped toppings. Processed cheese, cheese spreads, or cheese  curds. °Condiments °Onion and garlic salt, seasoned salt, table salt, and sea salt. Canned and packaged gravies. Worcestershire sauce. Tartar sauce. Barbecue sauce. Teriyaki sauce. Soy sauce, including reduced sodium. Steak sauce. Fish sauce. Oyster sauce. Cocktail sauce. Horseradish. Ketchup and mustard. Meat flavorings and tenderizers. Bouillon cubes. Hot sauce. Tabasco sauce. Marinades. Taco seasonings. Relishes. °Fats and Oils °Butter, stick margarine, lard, shortening, ghee, and bacon fat. Coconut, palm kernel, or palm oils. Regular salad dressings. °Other °Pickles and olives. Salted popcorn and pretzels. °The items listed above may not be a complete list of foods and beverages to avoid. Contact your dietitian for more information. °WHERE CAN I FIND MORE INFORMATION? °National Heart, Lung, and [employer] Institute: www.nhlbi.nih.gov/health/health-topics/topics/dash/ °Document Released: 10/26/2011 Document Revised: 03/23/2014 Document Reviewed: 09/10/2013 °ExitCare® Patient Information ©2015 [employer]. This information is not intended to replace advice given to you by your health care provider. Make sure you discuss any questions you have with your health care provider. ° °

## 2015-06-15 NOTE — Assessment & Plan Note (Signed)
Wears CPAP 5 of 7 days most weeks

## 2015-06-15 NOTE — Assessment & Plan Note (Signed)
Recheck cbc today. Increase leafy greens, consider increased lean red meat and using cast iron cookware. Continue to monitor, report any concerns

## 2015-06-15 NOTE — Assessment & Plan Note (Signed)
Is noting some recent incidents of feeling weak and light headed feelings, check a hgba1c

## 2015-06-15 NOTE — Assessment & Plan Note (Signed)
Grief reaction to father dying last month and sister with metastatic cancer. Declines SSRI today, has used in past may consider counseling, given paperwork for LB counseling.

## 2015-06-16 ENCOUNTER — Telehealth: Payer: Self-pay

## 2015-06-16 DIAGNOSIS — E559 Vitamin D deficiency, unspecified: Secondary | ICD-10-CM

## 2015-06-16 MED ORDER — VITAMIN D (ERGOCALCIFEROL) 1.25 MG (50000 UNIT) PO CAPS: 50000.0000 [IU] | ORAL_CAPSULE | ORAL | Status: DC

## 2015-06-16 NOTE — Telephone Encounter (Signed)
-----   Message from Mosie Lukes, MD sent at 06/15/2015 10:06 PM EDT ----- Notify labs look mostly good. Cholesterol up slightly, Encourage heart healthy diet such as DASH diet, increase exercise, avoid trans fats, consider a krill oil cap daily. Vitamin D is low, start Vit D 50000 IU caps 1 cap po weekly x 12 weeks. Disp #4 with 4 rf. Also have her take 2000 IU cap daily recheck level in 12 weeks. Sugar and all else normal

## 2015-06-16 NOTE — Telephone Encounter (Signed)
Pt notified of results verbalized understanding and no questions at this time. Vit D ordered and sent to pharmacy of choice

## 2015-08-09 ENCOUNTER — Encounter: Payer: Self-pay | Admitting: Internal Medicine

## 2015-08-09 ENCOUNTER — Ambulatory Visit (INDEPENDENT_AMBULATORY_CARE_PROVIDER_SITE_OTHER): Payer: 59 | Admitting: Internal Medicine

## 2015-08-09 VITALS — BP 130/86 | HR 70 | Ht 68.0 in | Wt 228.6 lb

## 2015-08-09 DIAGNOSIS — I480 Paroxysmal atrial fibrillation: Secondary | ICD-10-CM

## 2015-08-09 NOTE — Progress Notes (Signed)
Patient Care Team: Mosie Lukes, MD as PCP - General (Family Medicine) Deboraha Sprang, MD as Consulting Physician (Cardiology)   HPI  Misty Mahoney is a 49 y.o. female  seen in follow-up for paroxysmal atrial fibrillation identified a couple of years ago. This emerged following catheter ablation of AVNOdal  [place]   CHADS-VASc score was 1 with a CHADS-VASc score of 2.  At our last visit she declined anticoagulation.  She's had some orthopnea. Hit has been not long-standing.    Records and Results Reviewed   Past Medical History  Diagnosis Date  . SVT (supraventricular tachycardia)     s/p RFCA 09/26/12  . Hypertension   . Atrial fibrillation   . OSA (obstructive sleep apnea)   . Chicken pox as a child  . Anemia     gestational  . DM, gestational, diet controlled 04/10/2014  . Obesity, unspecified 04/10/2014  . H/O gestational diabetes mellitus, not currently pregnant 04/10/2014  . History of chicken pox   . Depression 06/15/2015    Past Surgical History  Procedure Laterality Date  . Cardiac electrophysiology study and ablation  2013  . Tonsillectomy  49 yrs old  . Wisdom tooth extraction  49 yrs old  . Adenoidectomy    . Palate / uvula biopsy / excision    . V-tach ablation N/A 09/26/2012    Procedure: V-TACH ABLATION;  Surgeon: Evans Lance, MD;  Location: [facility] CATH LAB;  Service: Cardiovascular;  Laterality: N/A;    Current Outpatient Prescriptions  Medication Sig Dispense Refill  . acetaminophen (TYLENOL) 325 MG tablet Take 650 mg by mouth every 6 (six) hours as needed. For pain    . Cholecalciferol (VITAMIN D) 2000 UNITS CAPS Take by mouth daily.    Marland Kitchen diltiazem (CARTIA XT) 180 MG 24 hr capsule Take 1 capsule (180 mg total) by mouth daily. 90 capsule 3  . ibuprofen (ADVIL,MOTRIN) 200 MG tablet Take 200 mg by mouth every 6 (six) hours as needed. For pain    . Omega-3 Fatty Acids (FISH OIL PO) Take by mouth daily.    . valsartan-hydrochlorothiazide  (DIOVAN-HCT) 320-12.5 MG per tablet Take 1 tablet by mouth daily. 90 tablet PRN  . Vitamin D, Ergocalciferol, (DRISDOL) 50000 UNITS CAPS capsule Take 1 capsule (50,000 Units total) by mouth every 7 (seven) days. 4 capsule 4   No current facility-administered medications for this visit.    No Known Allergies    Review of Systems negative except from HPI and PMH  Physical Exam BP 130/86 mmHg  Pulse 70  Ht 5\' 8"  (1.727 m)  Wt 228 lb 9.6 oz (103.692 kg)  BMI 34.77 kg/m2 Well developed and well nourished in no acute distress HENT normal E scleral and icterus clear Neck Supple JVP flat; carotids brisk and full Clear to ausculation  egular rate and rhythm, no murmurs gallops or rub Soft with active bowel sounds No clubbing cyanosis  Edema Alert and oriented, grossly normal motor and sensory function Skin Warm and Dry  ECG today demonstrated sinus rhythm at 70 Intervals 15/10/40  Assessment and  Plan atrial fibrillation  Hypertension   The patient has paroxysmal atrial fibrillation. We again discussed the role of anticoagulation. I reviewed with her some data from the [place] health care registry highlighting the importance of age and and the strong negative risks associated with younger age. Hence, not withstanding guidelines, I think it is a reasonable decision at this point for her to  forego anticoagulation.25   .

## 2015-08-09 NOTE — Patient Instructions (Signed)
Medication Instructions: - no changes  Labwork: - none  Procedures/Testing: - Your physician has requested that you have an echocardiogram. Echocardiography is a painless test that uses sound waves to create images of your heart. It provides your doctor with information about the size and shape of your heart and how well your heart's chambers and valves are working. This procedure takes approximately one hour. There are no restrictions for this procedure.  Follow-Up: - Your physician wants you to follow-up in: 2 years with Dr. Caryl Comes. You will receive a reminder letter in the mail two months in advance. If you don't receive a letter, please call our office to schedule the follow-up appointment.  Any Additional Special Instructions Will Be Listed Below (If Applicable). - You have been given a prescription to take to the ER- if you present with persistent a-fib, to please give Flecainide 300 mg x 1 dose orally.

## 2015-08-13 ENCOUNTER — Other Ambulatory Visit: Payer: Self-pay

## 2015-08-13 LAB — HM PAP SMEAR: HM Pap smear: NEGATIVE

## 2015-08-16 LAB — CYTOLOGY - PAP

## 2015-08-19 ENCOUNTER — Encounter: Payer: Self-pay | Admitting: Family Medicine

## 2015-08-23 ENCOUNTER — Ambulatory Visit (HOSPITAL_COMMUNITY): Payer: 59 | Attending: Cardiology

## 2015-08-23 ENCOUNTER — Other Ambulatory Visit: Payer: Self-pay

## 2015-08-23 DIAGNOSIS — G4733 Obstructive sleep apnea (adult) (pediatric): Secondary | ICD-10-CM | POA: Diagnosis not present

## 2015-08-23 DIAGNOSIS — I340 Nonrheumatic mitral (valve) insufficiency: Secondary | ICD-10-CM | POA: Diagnosis not present

## 2015-08-23 DIAGNOSIS — I10 Essential (primary) hypertension: Secondary | ICD-10-CM | POA: Insufficient documentation

## 2015-08-23 DIAGNOSIS — Z6834 Body mass index (BMI) 34.0-34.9, adult: Secondary | ICD-10-CM | POA: Insufficient documentation

## 2015-08-23 DIAGNOSIS — E119 Type 2 diabetes mellitus without complications: Secondary | ICD-10-CM | POA: Diagnosis not present

## 2015-08-23 DIAGNOSIS — I071 Rheumatic tricuspid insufficiency: Secondary | ICD-10-CM | POA: Diagnosis not present

## 2015-08-23 DIAGNOSIS — E669 Obesity, unspecified: Secondary | ICD-10-CM | POA: Diagnosis not present

## 2015-08-23 DIAGNOSIS — I480 Paroxysmal atrial fibrillation: Secondary | ICD-10-CM

## 2015-08-23 DIAGNOSIS — I4891 Unspecified atrial fibrillation: Secondary | ICD-10-CM | POA: Diagnosis present

## 2015-08-30 ENCOUNTER — Ambulatory Visit (INDEPENDENT_AMBULATORY_CARE_PROVIDER_SITE_OTHER): Payer: 59 | Admitting: Sports Medicine

## 2015-08-30 ENCOUNTER — Encounter: Payer: Self-pay | Admitting: Sports Medicine

## 2015-08-30 VITALS — BP 142/66 | Ht 68.0 in | Wt 230.0 lb

## 2015-08-30 DIAGNOSIS — M25562 Pain in left knee: Secondary | ICD-10-CM

## 2015-08-30 NOTE — Progress Notes (Signed)
HPI: Ms. Misty Mahoney is a 49 y.o. Female who presents to clinic with a chief complaint of left knee pain.  She reports that her knee has been bothering her since August.  She states that she'd started a new running class in august and 2-3 weeks into the class she noticed posterior knee stiffness, tightness and pain.  She reports that the pain is worse with impact and hills while running and difficulty with reaching full knee flexion.  She has tried icing and ibuprofen the immediate 2 weeks after her injury which she believes helped her pain some.  She also modified her activity to only include walking for cardio.  Today she feels that her pain has improved but still feels that her stride is "off" and at the end of the day still has stiffness and feels that her her gait is off. Prior to the injury she denies changing her speed, terrain or mileage.  She denies prior knee surgery. She reports occasional swelling.   Review of Systems  Musculoskeletal: Negative for falls.  All other systems reviewed and are negative.  Physical Exam: BP 142/66 mmHg  Ht 5\' 8"  (1.727 m)  Wt 230 lb (104.327 kg)  BMI 34.98 kg/m2 Physical Exam  Constitutional: She appears well-developed and well-nourished.  Musculoskeletal:       Right knee: She exhibits normal range of motion and no swelling. No tenderness found.  Neurological: She has normal strength.  Reflex Scores:      Patellar reflexes are 2+ on the right side and 2+ on the left side. Knee  inspection: anterior aspect of left knee appears slightly swollen compared to the right.   Palpation: non-ttp, posterior knee fullness on left  ROM: full ROM bilaterally  Thessaly test: medial left knee discomfort  Sensation:intact   10/10 Limited view U/S Left knee:  Findings: demonstrated baker's cyst   Assessment/Plan  Ms. Misty Mahoney is a 49 y.o. female presenting with left knee pain since August and physical exam and ultrasound findings consistent with  baker's cyst. Given that baker's cyst often occur secondary to other pathology we will obtain imaging to rule out osteoarthritis versus degenerative meniscal tear. We also provided her with a home exercise program to improve the strength of her quads.  - 4 view X-ray L Knee (AP & 30 deg flexion standing; lateral & sunrise views) r/o OA vs degenerative meniscal tear  - H.E.P: Straight leg raise 4-5x/wk to strengthen quads  - Continue ibuprofen PRN for pain - Follow up in 3 weeks: if she shows signs of improvement we will increase the intensity of her HEP; if she plateaus or fails to improve we will consider doing a steroid injection to help with pain and to help the baker's cyst resolve.  Patient seen and evaluated with the medical student. I agree with the plan of care. Patient likely has some degree of osteoarthritis in her knee which is responsible for her symptoms. Baker's cyst seen on ultrasound was quite small. I've recommended that she get a compression stocking (she already has one at home) and start isometric quad exercises. I will get x-rays of her knee and have her follow-up with me in 3 weeks. I did discuss the possibility of an intra-articular cortisone injection if symptoms persist at follow-up. She may continue with activity as tolerated but I have cautioned her against heavy impact exercise such as running. I think she would do better with nonimpact activities such as cycling or swimming.

## 2015-09-03 ENCOUNTER — Ambulatory Visit (HOSPITAL_COMMUNITY)
Admission: RE | Admit: 2015-09-03 | Discharge: 2015-09-03 | Disposition: A | Discharge status: Home / Self Care | Payer: 59 | Source: Ambulatory Visit | Attending: Sports Medicine | Admitting: Sports Medicine

## 2015-09-03 DIAGNOSIS — M25562 Pain in left knee: Secondary | ICD-10-CM | POA: Insufficient documentation

## 2015-09-03 DIAGNOSIS — M1712 Unilateral primary osteoarthritis, left knee: Secondary | ICD-10-CM | POA: Diagnosis not present

## 2015-09-07 ENCOUNTER — Encounter: Payer: Self-pay | Admitting: Sports Medicine

## 2015-09-07 NOTE — Telephone Encounter (Signed)
Called patient and gave xray results. She will discuss her knee pain and exercises at her next appt

## 2015-09-07 NOTE — Telephone Encounter (Signed)
-----   Message from Thurman Coyer, DO sent at 09/03/2015  4:58 PM EDT ----- Regarding: xray results Please call her and tell her that she has very mild OA in her knee. She has a follow up appt in a couple of weeks and if her knee is still hurting we may want to think about doing a cortisone injection.  ----- Message -----    From: Rad Results In Interface    Sent: 09/03/2015   3:21 PM      To: Thurman Coyer, DO

## 2015-09-22 ENCOUNTER — Ambulatory Visit (INDEPENDENT_AMBULATORY_CARE_PROVIDER_SITE_OTHER): Payer: 59 | Admitting: Sports Medicine

## 2015-09-22 ENCOUNTER — Encounter: Payer: Self-pay | Admitting: Sports Medicine

## 2015-09-22 VITALS — BP 146/69 | HR 72 | Ht 68.0 in | Wt 225.0 lb

## 2015-09-22 DIAGNOSIS — M25562 Pain in left knee: Secondary | ICD-10-CM | POA: Diagnosis not present

## 2015-09-22 NOTE — Progress Notes (Signed)
   Subjective:    Patient ID: Misty Mahoney, female    DOB: 15-Apr-1966, 49 y.o.   MRN: [id number]  HPI   Patient comes in today for follow-up on left knee pain. X-rays show a mild amount of medial compartmental DJD but still with pretty good joint space. Her main complaint continues to be stiffness and swelling particularly in the posterior aspect of her knee. It is much worse after running. In fact, she was feeling pretty good until she tried to run a few days ago. Her symptoms returned immediately afterwards. She takes an occasional over-the-counter NSAID as needed. She also gets occasional catching and popping in the knee which can be quite painful.    Review of Systems     Objective:   Physical Exam  Well-developed, well-nourished. No acute distress  Left knee: Full range of motion. Trace effusion. She is tender to palpation along the medial joint line with pain but no popping with McMurray's. No tenderness along the lateral joint line. Trace patellofemoral crepitus. Good ligamentous stability. Neurovascularly intact distally.  X-rays are as above      Assessment & Plan:  Left knee pain secondary to mild medial compartment of DJD versus degenerative meniscal tear  We discussed the merits of both a cortisone injection and an MRI. At this point in time the patient would like to wait on both of those. I think she would benefit from continued physical therapy and possibly a knee brace so we will try to enroll her in our ongoing arthritis study. I think she should take the next 6-8 weeks and focus primarily on nonimpact exercises. After this period of relative rest I would like for her to try to resume some light running. If her symptoms return we need to reconsider the idea of a cortisone injection versus an MRI to rule out a meniscal tear. In the meantime she may continue with her over-the-counter NSAIDs as needed. Follow-up with me in 6 weeks but call with questions or concerns in the  interim.

## 2015-11-30 DIAGNOSIS — I8312 Varicose veins of left lower extremity with inflammation: Secondary | ICD-10-CM | POA: Diagnosis not present

## 2015-11-30 DIAGNOSIS — I83812 Varicose veins of left lower extremities with pain: Secondary | ICD-10-CM | POA: Diagnosis not present

## 2015-11-30 DIAGNOSIS — I87322 Chronic venous hypertension (idiopathic) with inflammation of left lower extremity: Secondary | ICD-10-CM | POA: Diagnosis not present

## 2015-12-15 MED FILL — VALSARTAN-HCTZ 320-12.5 MG: 320-12.5 | 90 days supply | Qty: 90 | Fill #2

## 2015-12-17 ENCOUNTER — Encounter: Payer: Self-pay | Admitting: General Practice

## 2015-12-17 ENCOUNTER — Telehealth: Payer: Self-pay | Admitting: General Practice

## 2015-12-17 DIAGNOSIS — I8311 Varicose veins of right lower extremity with inflammation: Secondary | ICD-10-CM | POA: Diagnosis not present

## 2015-12-17 DIAGNOSIS — I83811 Varicose veins of right lower extremities with pain: Secondary | ICD-10-CM | POA: Diagnosis not present

## 2015-12-17 DIAGNOSIS — I87321 Chronic venous hypertension (idiopathic) with inflammation of right lower extremity: Secondary | ICD-10-CM | POA: Diagnosis not present

## 2015-12-17 NOTE — Telephone Encounter (Signed)
Pre-visit phone call for pt completed. Chart updated to reflect.   

## 2015-12-20 ENCOUNTER — Encounter: Payer: Self-pay | Admitting: Family Medicine

## 2015-12-20 ENCOUNTER — Ambulatory Visit (INDEPENDENT_AMBULATORY_CARE_PROVIDER_SITE_OTHER): Payer: 59 | Admitting: Family Medicine

## 2015-12-20 VITALS — BP 134/62 | HR 70 | Temp 98.5°F | Ht 68.0 in | Wt 236.5 lb

## 2015-12-20 DIAGNOSIS — Z8619 Personal history of other infectious and parasitic diseases: Secondary | ICD-10-CM

## 2015-12-20 DIAGNOSIS — Z0000 Encounter for general adult medical examination without abnormal findings: Secondary | ICD-10-CM | POA: Diagnosis not present

## 2015-12-20 DIAGNOSIS — G4733 Obstructive sleep apnea (adult) (pediatric): Secondary | ICD-10-CM | POA: Diagnosis not present

## 2015-12-20 DIAGNOSIS — E785 Hyperlipidemia, unspecified: Secondary | ICD-10-CM | POA: Diagnosis not present

## 2015-12-20 DIAGNOSIS — E559 Vitamin D deficiency, unspecified: Secondary | ICD-10-CM

## 2015-12-20 DIAGNOSIS — I10 Essential (primary) hypertension: Secondary | ICD-10-CM

## 2015-12-20 DIAGNOSIS — D509 Iron deficiency anemia, unspecified: Secondary | ICD-10-CM

## 2015-12-20 DIAGNOSIS — E669 Obesity, unspecified: Secondary | ICD-10-CM | POA: Diagnosis not present

## 2015-12-20 DIAGNOSIS — R739 Hyperglycemia, unspecified: Secondary | ICD-10-CM | POA: Diagnosis not present

## 2015-12-20 DIAGNOSIS — I471 Supraventricular tachycardia, unspecified: Secondary | ICD-10-CM

## 2015-12-20 DIAGNOSIS — Z1211 Encounter for screening for malignant neoplasm of colon: Secondary | ICD-10-CM | POA: Diagnosis not present

## 2015-12-20 DIAGNOSIS — I480 Paroxysmal atrial fibrillation: Secondary | ICD-10-CM | POA: Diagnosis not present

## 2015-12-20 HISTORY — DX: Hyperlipidemia, unspecified: E78.5

## 2015-12-20 LAB — COMPREHENSIVE METABOLIC PANEL
ALBUMIN: 3.9 g/dL (ref 3.5–5.2)
ALT: 14 U/L (ref 0–35)
AST: 17 U/L (ref 0–37)
Alkaline Phosphatase: 40 U/L (ref 39–117)
BILIRUBIN TOTAL: 0.3 mg/dL (ref 0.2–1.2)
BUN: 19 mg/dL (ref 6–23)
CALCIUM: 9.3 mg/dL (ref 8.4–10.5)
CO2: 30 meq/L (ref 19–32)
Chloride: 102 mEq/L (ref 96–112)
Creatinine, Ser: 0.63 mg/dL (ref 0.40–1.20)
GFR: 106.09 mL/min (ref >60.00)
Glucose, Bld: 107 mg/dL — ABNORMAL HIGH (ref 70–99)
Potassium: 4.7 mEq/L (ref 3.5–5.1)
Sodium: 138 mEq/L (ref 135–145)
Total Protein: 7.4 g/dL (ref 6.0–8.3)

## 2015-12-20 LAB — CBC
HCT: 34.2 % — ABNORMAL LOW (ref 36.0–46.0)
Hemoglobin: 11.1 g/dL — ABNORMAL LOW (ref 12.0–15.0)
MCHC: 32.5 g/dL (ref 30.0–36.0)
MCV: 85.9 fl (ref 78.0–100.0)
Platelets: 300 10*3/uL (ref 150.0–400.0)
RBC: 3.98 Mil/uL (ref 3.87–5.11)
RDW: 14.3 % (ref 11.5–15.5)
WBC: 8.9 10*3/uL (ref 4.0–10.5)

## 2015-12-20 LAB — LIPID PANEL
Cholesterol: 146 mg/dL (ref 0–200)
HDL: 54.1 mg/dL (ref >39.00)
LDL Cholesterol: 74 mg/dL (ref 0–99)
NonHDL: 91.87
TRIGLYCERIDES: 90 mg/dL (ref 0.0–149.0)
Total CHOL/HDL Ratio: 3
VLDL: 18 mg/dL (ref 0.0–40.0)

## 2015-12-20 LAB — TSH: TSH: 1.83 u[IU]/mL (ref 0.35–4.50)

## 2015-12-20 LAB — HEMOGLOBIN A1C: HEMOGLOBIN A1C: 5.7 % (ref 4.6–6.5)

## 2015-12-20 LAB — VITAMIN D 25 HYDROXY (VIT D DEFICIENCY, FRACTURES): VITD: 27.32 ng/mL — AB (ref 30.00–100.00)

## 2015-12-20 NOTE — Patient Instructions (Signed)
Preventive Care for Adults, Female A healthy lifestyle and preventive care can promote health and wellness. Preventive health guidelines for women include the following key practices.  A routine yearly physical is a good way to check with your health care provider about your health and preventive screening. It is a chance to share any concerns and updates on your health and to receive a thorough exam.  Visit your dentist for a routine exam and preventive care every 6 months. Brush your teeth twice a day and floss once a day. Good oral hygiene prevents tooth decay and gum disease.  The frequency of eye exams is based on your age, health, family medical history, use of contact lenses, and other factors. Follow your health care provider's recommendations for frequency of eye exams.  Eat a healthy diet. Foods like vegetables, fruits, whole grains, low-fat dairy products, and lean protein foods contain the nutrients you need without too many calories. Decrease your intake of foods high in solid fats, added sugars, and salt. Eat the right amount of calories for you.Get information about a proper diet from your health care provider, if necessary.  Regular physical exercise is one of the most important things you can do for your health. Most adults should get at least 150 minutes of moderate-intensity exercise (any activity that increases your heart rate and causes you to sweat) each week. In addition, most adults need muscle-strengthening exercises on 2 or more days a week.  Maintain a healthy weight. The body mass index (BMI) is a screening tool to identify possible weight problems. It provides an estimate of body fat based on height and weight. Your health care provider can find your BMI and can help you achieve or maintain a healthy weight.For adults 20 years and older:  A BMI below 18.5 is considered underweight.  A BMI of 18.5 to 24.9 is normal.  A BMI of 25 to 29.9 is considered  overweight.  A BMI of 30 and above is considered obese.  Maintain normal blood lipids and cholesterol levels by exercising and minimizing your intake of saturated fat. Eat a balanced diet with plenty of fruit and vegetables. Blood tests for lipids and cholesterol should begin at age 52 and be repeated every 5 years. If your lipid or cholesterol levels are high, you are over 50, or you are at high risk for heart disease, you may need your cholesterol levels checked more frequently.Ongoing high lipid and cholesterol levels should be treated with medicines if diet and exercise are not working.  If you smoke, find out from your health care provider how to quit. If you do not use tobacco, do not start.  Lung cancer screening is recommended for adults aged 55-80 years who are at high risk for developing lung cancer because of a history of smoking. A yearly low-dose CT scan of the lungs is recommended for people who have at least a 30-pack-year history of smoking and are a current smoker or have quit within the past 15 years. A pack year of smoking is smoking an average of 1 pack of cigarettes a day for 1 year (for example: 1 pack a day for 30 years or 2 packs a day for 15 years). Yearly screening should continue until the smoker has stopped smoking for at least 15 years. Yearly screening should be stopped for people who develop a health problem that would prevent them from having lung cancer treatment.  If you are pregnant, do not drink alcohol. If you  are breastfeeding, be very cautious about drinking alcohol. If you are not pregnant and choose to drink alcohol, do not have more than 1 drink per day. One drink is considered to be 12 ounces (355 mL) of beer, 5 ounces (148 mL) of wine, or 1.5 ounces (44 mL) of liquor.  Avoid use of street drugs. Do not share needles with anyone. Ask for help if you need support or instructions about stopping the use of drugs.  High blood pressure causes heart disease and  increases the risk of stroke. Your blood pressure should be checked at least every 1 to 2 years. Ongoing high blood pressure should be treated with medicines if weight loss and exercise do not work.  If you are 12-58 years old, ask your health care provider if you should take aspirin to prevent strokes.  Diabetes screening is done by taking a blood sample to check your blood glucose level after you have not eaten for a certain period of time (fasting). If you are not overweight and you do not have risk factors for diabetes, you should be screened once every 3 years starting at age 76. If you are overweight or obese and you are 18-1 years of age, you should be screened for diabetes every year as part of your cardiovascular risk assessment.  Breast cancer screening is essential preventive care for women. You should practice "breast self-awareness." This means understanding the normal appearance and feel of your breasts and may include breast self-examination. Any changes detected, no matter how small, should be reported to a health care provider. Women in their 73s and 30s should have a clinical breast exam (CBE) by a health care provider as part of a regular health exam every 1 to 3 years. After age 36, women should have a CBE every year. Starting at age 50, women should consider having a mammogram (breast X-ray test) every year. Women who have a family history of breast cancer should talk to their health care provider about genetic screening. Women at a high risk of breast cancer should talk to their health care providers about having an MRI and a mammogram every year.  Breast cancer gene (BRCA)-related cancer risk assessment is recommended for women who have family members with BRCA-related cancers. BRCA-related cancers include breast, ovarian, tubal, and peritoneal cancers. Having family members with these cancers may be associated with an increased risk for harmful changes (mutations) in the breast  cancer genes BRCA1 and BRCA2. Results of the assessment will determine the need for genetic counseling and BRCA1 and BRCA2 testing.  Your health care provider may recommend that you be screened regularly for cancer of the pelvic organs (ovaries, uterus, and vagina). This screening involves a pelvic examination, including checking for microscopic changes to the surface of your cervix (Pap test). You may be encouraged to have this screening done every 3 years, beginning at age 40.  For women ages 32-65, health care providers may recommend pelvic exams and Pap testing every 3 years, or they may recommend the Pap and pelvic exam, combined with testing for human papilloma virus (HPV), every 5 years. Some types of HPV increase your risk of cervical cancer. Testing for HPV may also be done on women of any age with unclear Pap test results.  Other health care providers may not recommend any screening for nonpregnant women who are considered low risk for pelvic cancer and who do not have symptoms. Ask your health care provider if a screening pelvic exam is right  you.  If you have had past treatment for cervical cancer or a condition that could lead to cancer, you need Pap tests and screening for cancer for at least 20 years after your treatment. If Pap tests have been discontinued, your risk factors (such as having a new sexual partner) need to be reassessed to determine if screening should resume. Some women have medical problems that increase the chance of getting cervical cancer. In these cases, your health care provider may recommend more frequent screening and Pap tests.  Colorectal cancer can be detected and often prevented. Most routine colorectal cancer screening begins at the age of 50 years and continues through age 75 years. However, your health care provider may recommend screening at an earlier age if you have risk factors for colon cancer. On a yearly basis, your health care provider may provide home test kits to check  for hidden blood in the stool. Use of a small camera at the end of a tube, to directly examine the colon (sigmoidoscopy or colonoscopy), can detect the earliest forms of colorectal cancer. Talk to your health care provider about this at age 50, when routine screening begins. Direct exam of the colon should be repeated every 5-10 years through age 75 years, unless early forms of precancerous polyps or small growths are found.  People who are at an increased risk for hepatitis B should be screened for this virus. You are considered at high risk for hepatitis B if:  You were born in a country where hepatitis B occurs often. Talk with your health care provider about which countries are considered high risk.  Your parents were born in a high-risk country and you have not received a shot to protect against hepatitis B (hepatitis B vaccine).  You have HIV or AIDS.  You use needles to inject street drugs.  You live with, or have sex with, someone who has hepatitis B.  You get hemodialysis treatment.  You take certain medicines for conditions like cancer, organ transplantation, and autoimmune conditions.  Hepatitis C blood testing is recommended for all people born from 1945 through 1965 and any individual with known risks for hepatitis C.  Practice safe sex. Use condoms and avoid high-risk sexual practices to reduce the spread of sexually transmitted infections (STIs). STIs include gonorrhea, chlamydia, syphilis, trichomonas, herpes, HPV, and human immunodeficiency virus (HIV). Herpes, HIV, and HPV are viral illnesses that have no cure. They can result in disability, cancer, and death.  You should be screened for sexually transmitted illnesses (STIs) including gonorrhea and chlamydia if:  You are sexually active and are younger than 24 years.  You are older than 24 years and your health care provider tells you that you are at risk for this type of infection.  Your sexual activity has changed  since you were last screened and you are at an increased risk for chlamydia or gonorrhea. Ask your health care provider if you are at risk.  If you are at risk of being infected with HIV, it is recommended that you take a prescription medicine daily to prevent HIV infection. This is called preexposure prophylaxis (PrEP). You are considered at risk if:  You are sexually active and do not regularly use condoms or know the HIV status of your partner(s).  You take drugs by injection.  You are sexually active with a partner who has HIV.  Talk with your health care provider about whether you are at high risk of being infected with HIV. If   you choose to begin PrEP, you should first be tested for HIV. You should then be tested every 3 months for as long as you are taking PrEP.  Osteoporosis is a disease in which the bones lose minerals and strength with aging. This can result in serious bone fractures or breaks. The risk of osteoporosis can be identified using a bone density scan. Women ages 65 years and over and women at risk for fractures or osteoporosis should discuss screening with their health care providers. Ask your health care provider whether you should take a calcium supplement or vitamin D to reduce the rate of osteoporosis.  Menopause can be associated with physical symptoms and risks. Hormone replacement therapy is available to decrease symptoms and risks. You should talk to your health care provider about whether hormone replacement therapy is right for you.  Use sunscreen. Apply sunscreen liberally and repeatedly throughout the day. You should seek shade when your shadow is shorter than you. Protect yourself by wearing long sleeves, pants, a wide-brimmed hat, and sunglasses year round, whenever you are outdoors.  Once a month, do a whole body skin exam, using a mirror to look at the skin on your back. Tell your health care provider of new moles, moles that have irregular borders, moles that  are larger than a pencil eraser, or moles that have changed in shape or color.  Stay current with required vaccines (immunizations).  Influenza vaccine. All adults should be immunized every year.  Tetanus, diphtheria, and acellular pertussis (Td, Tdap) vaccine. Pregnant women should receive 1 dose of Tdap vaccine during each pregnancy. The dose should be obtained regardless of the length of time since the last dose. Immunization is preferred during the 27th-36th week of gestation. An adult who has not previously received Tdap or who does not know her vaccine status should receive 1 dose of Tdap. This initial dose should be followed by tetanus and diphtheria toxoids (Td) booster doses every 10 years. Adults with an unknown or incomplete history of completing a 3-dose immunization series with Td-containing vaccines should begin or complete a primary immunization series including a Tdap dose. Adults should receive a Td booster every 10 years.  Varicella vaccine. An adult without evidence of immunity to varicella should receive 2 doses or a second dose if she has previously received 1 dose. Pregnant females who do not have evidence of immunity should receive the first dose after pregnancy. This first dose should be obtained before leaving the health care facility. The second dose should be obtained 4-8 weeks after the first dose.  Human papillomavirus (HPV) vaccine. Females aged 13-26 years who have not received the vaccine previously should obtain the 3-dose series. The vaccine is not recommended for use in pregnant females. However, pregnancy testing is not needed before receiving a dose. If a female is found to be pregnant after receiving a dose, no treatment is needed. In that case, the remaining doses should be delayed until after the pregnancy. Immunization is recommended for any person with an immunocompromised condition through the age of 26 years if she did not get any or all doses earlier. During the  3-dose series, the second dose should be obtained 4-8 weeks after the first dose. The third dose should be obtained 24 weeks after the first dose and 16 weeks after the second dose.  Zoster vaccine. One dose is recommended for adults aged 60 years or older unless certain conditions are present.  Measles, mumps, and rubella (MMR) vaccine. Adults born   born before [birth year] generally are considered immune to measles and mumps. Adults born in [birth year] or later should have 1 or more doses of MMR vaccine unless there is a contraindication to the vaccine or there is laboratory evidence of immunity to each of the three diseases. A routine second dose of MMR vaccine should be obtained at least 28 days after the first dose for students attending postsecondary schools, health care workers, or international travelers. People who received inactivated measles vaccine or an unknown type of measles vaccine during 1963-1967 should receive 2 doses of MMR vaccine. People who received inactivated mumps vaccine or an unknown type of mumps vaccine before 1979 and are at high risk for mumps infection should consider immunization with 2 doses of MMR vaccine. For females of childbearing age, rubella immunity should be determined. If there is no evidence of immunity, females who are not pregnant should be vaccinated. If there is no evidence of immunity, females who are pregnant should delay immunization until after pregnancy. Unvaccinated health care workers born before [birth year] who lack laboratory evidence of measles, mumps, or rubella immunity or laboratory confirmation of disease should consider measles and mumps immunization with 2 doses of MMR vaccine or rubella immunization with 1 dose of MMR vaccine.  Pneumococcal 13-valent conjugate (PCV13) vaccine. When indicated, a person who is uncertain of his immunization history and has no record of immunization should receive the PCV13 vaccine. All adults 62 years of age and older  should receive this vaccine. An adult aged 39 years or older who has certain medical conditions and has not been previously immunized should receive 1 dose of PCV13 vaccine. This PCV13 should be followed with a dose of pneumococcal polysaccharide (PPSV23) vaccine. Adults who are at high risk for pneumococcal disease should obtain the PPSV23 vaccine at least 8 weeks after the dose of PCV13 vaccine. Adults older than 51 years of age who have normal immune system function should obtain the PPSV23 vaccine dose at least 1 year after the dose of PCV13 vaccine.  Pneumococcal polysaccharide (PPSV23) vaccine. When PCV13 is also indicated, PCV13 should be obtained first. All adults aged 47 years and older should be immunized. An adult younger than age 27 years who has certain medical conditions should be immunized. Any person who resides in a nursing home or long-term care facility should be immunized. An adult smoker should be immunized. People with an immunocompromised condition and certain other conditions should receive both PCV13 and PPSV23 vaccines. People with human immunodeficiency virus (HIV) infection should be immunized as soon as possible after diagnosis. Immunization during chemotherapy or radiation therapy should be avoided. Routine use of PPSV23 vaccine is not recommended for American Indians, [place] Natives, or people younger than 65 years unless there are medical conditions that require PPSV23 vaccine. When indicated, people who have unknown immunization and have no record of immunization should receive PPSV23 vaccine. One-time revaccination 5 years after the first dose of PPSV23 is recommended for people aged 19-64 years who have chronic kidney failure, nephrotic syndrome, asplenia, or immunocompromised conditions. People who received 1-2 doses of PPSV23 before age 57 years should receive another dose of PPSV23 vaccine at age 42 years or later if at least 5 years have passed since the previous dose. Doses  of PPSV23 are not needed for people immunized with PPSV23 at or after age 51 years.  Meningococcal vaccine. Adults with asplenia or persistent complement component deficiencies should receive 2 doses of quadrivalent meningococcal conjugate (MenACWY-D) vaccine. The doses should be  obtained at least 2 months apart. Microbiologists working with certain meningococcal bacteria, [employer] recruits, people at risk during an outbreak, and people who travel to or live in countries with a high rate of meningitis should be immunized. A first-year college student up through age 40 years who is living in a residence hall should receive a dose if she did not receive a dose on or after her 16th birthday. Adults who have certain high-risk conditions should receive one or more doses of vaccine.  Hepatitis A vaccine. Adults who wish to be protected from this disease, have certain high-risk conditions, work with hepatitis A-infected animals, work in hepatitis A research labs, or travel to or work in countries with a high rate of hepatitis A should be immunized. Adults who were previously unvaccinated and who anticipate close contact with an international adoptee during the first 60 days after arrival in the [place] from a country with a high rate of hepatitis A should be immunized.  Hepatitis B vaccine. Adults who wish to be protected from this disease, have certain high-risk conditions, may be exposed to blood or other infectious body fluids, are household contacts or sex partners of hepatitis B positive people, are clients or workers in certain care facilities, or travel to or work in countries with a high rate of hepatitis B should be immunized.  Haemophilus influenzae type b (Hib) vaccine. A previously unvaccinated person with asplenia or sickle cell disease or having a scheduled splenectomy should receive 1 dose of Hib vaccine. Regardless of previous immunization, a recipient of a hematopoietic stem cell transplant  should receive a 3-dose series 6-12 months after her successful transplant. Hib vaccine is not recommended for adults with HIV infection. Preventive Services / Frequency Ages 54 to 84 years  Blood pressure check.** / Every 3-5 years.  Lipid and cholesterol check.** / Every 5 years beginning at age 33.  Clinical breast exam.** / Every 3 years for women in their 44s and 32s.  BRCA-related cancer risk assessment.** / For women who have family members with a BRCA-related cancer (breast, ovarian, tubal, or peritoneal cancers).  Pap test.** / Every 2 years from ages 25 through 79. Every 3 years starting at age 48 through age 19 or 4 with a history of 3 consecutive normal Pap tests.  HPV screening.** / Every 3 years from ages 75 through ages 49 to 46 with a history of 3 consecutive normal Pap tests.  Hepatitis C blood test.** / For any individual with known risks for hepatitis C.  Skin self-exam. / Monthly.  Influenza vaccine. / Every year.  Tetanus, diphtheria, and acellular pertussis (Tdap, Td) vaccine.** / Consult your health care provider. Pregnant women should receive 1 dose of Tdap vaccine during each pregnancy. 1 dose of Td every 10 years.  Varicella vaccine.** / Consult your health care provider. Pregnant females who do not have evidence of immunity should receive the first dose after pregnancy.  HPV vaccine. / 3 doses over 6 months, if 31 and younger. The vaccine is not recommended for use in pregnant females. However, pregnancy testing is not needed before receiving a dose.  Measles, mumps, rubella (MMR) vaccine.** / You need at least 1 dose of MMR if you were born in 1957 or later. You may also need a 2nd dose. For females of childbearing age, rubella immunity should be determined. If there is no evidence of immunity, females who are not pregnant should be vaccinated. If there is no evidence of immunity, females who  are pregnant should delay immunization until after  pregnancy.  Pneumococcal 13-valent conjugate (PCV13) vaccine.** / Consult your health care provider.  Pneumococcal polysaccharide (PPSV23) vaccine.** / 1 to 2 doses if you smoke cigarettes or if you have certain conditions.  Meningococcal vaccine.** / 1 dose if you are age 10 to 5 years and a Market researcher living in a residence hall, or have one of several medical conditions, you need to get vaccinated against meningococcal disease. You may also need additional booster doses.  Hepatitis A vaccine.** / Consult your health care provider.  Hepatitis B vaccine.** / Consult your health care provider.  Haemophilus influenzae type b (Hib) vaccine.** / Consult your health care provider. Ages 4 to 91 years  Blood pressure check.** / Every year.  Lipid and cholesterol check.** / Every 5 years beginning at age 23 years.  Lung cancer screening. / Every year if you are aged 67-80 years and have a 30-pack-year history of smoking and currently smoke or have quit within the past 15 years. Yearly screening is stopped once you have quit smoking for at least 15 years or develop a health problem that would prevent you from having lung cancer treatment.  Clinical breast exam.** / Every year after age 14 years.  BRCA-related cancer risk assessment.** / For women who have family members with a BRCA-related cancer (breast, ovarian, tubal, or peritoneal cancers).  Mammogram.** / Every year beginning at age 79 years and continuing for as long as you are in good health. Consult with your health care provider.  Pap test.** / Every 3 years starting at age 47 years through age 48 or 73 years with a history of 3 consecutive normal Pap tests.  HPV screening.** / Every 3 years from ages 58 years through ages 71 to 65 years with a history of 3 consecutive normal Pap tests.  Fecal occult blood test (FOBT) of stool. / Every year beginning at age 58 years and continuing until age 64 years. You may not need  to do this test if you get a colonoscopy every 10 years.  Flexible sigmoidoscopy or colonoscopy.** / Every 5 years for a flexible sigmoidoscopy or every 10 years for a colonoscopy beginning at age 43 years and continuing until age 34 years.  Hepatitis C blood test.** / For all people born from [birth year] through 1965 and any individual with known risks for hepatitis C.  Skin self-exam. / Monthly.  Influenza vaccine. / Every year.  Tetanus, diphtheria, and acellular pertussis (Tdap/Td) vaccine.** / Consult your health care provider. Pregnant women should receive 1 dose of Tdap vaccine during each pregnancy. 1 dose of Td every 10 years.  Varicella vaccine.** / Consult your health care provider. Pregnant females who do not have evidence of immunity should receive the first dose after pregnancy.  Zoster vaccine.** / 1 dose for adults aged 89 years or older.  Measles, mumps, rubella (MMR) vaccine.** / You need at least 1 dose of MMR if you were born in 1957 or later. You may also need a second dose. For females of childbearing age, rubella immunity should be determined. If there is no evidence of immunity, females who are not pregnant should be vaccinated. If there is no evidence of immunity, females who are pregnant should delay immunization until after pregnancy.  Pneumococcal 13-valent conjugate (PCV13) vaccine.** / Consult your health care provider.  Pneumococcal polysaccharide (PPSV23) vaccine.** / 1 to 2 doses if you smoke cigarettes or if you have certain conditions.  Meningococcal vaccine.** /  Consult your health care provider.  Hepatitis A vaccine.** / Consult your health care provider.  Hepatitis B vaccine.** / Consult your health care provider.  Haemophilus influenzae type b (Hib) vaccine.** / Consult your health care provider. Ages 80 years and over  Blood pressure check.** / Every year.  Lipid and cholesterol check.** / Every 5 years beginning at age 62 years.  Lung cancer  screening. / Every year if you are aged 32-80 years and have a 30-pack-year history of smoking and currently smoke or have quit within the past 15 years. Yearly screening is stopped once you have quit smoking for at least 15 years or develop a health problem that would prevent you from having lung cancer treatment.  Clinical breast exam.** / Every year after age 61 years.  BRCA-related cancer risk assessment.** / For women who have family members with a BRCA-related cancer (breast, ovarian, tubal, or peritoneal cancers).  Mammogram.** / Every year beginning at age 39 years and continuing for as long as you are in good health. Consult with your health care provider.  Pap test.** / Every 3 years starting at age 85 years through age 74 or 72 years with 3 consecutive normal Pap tests. Testing can be stopped between 65 and 70 years with 3 consecutive normal Pap tests and no abnormal Pap or HPV tests in the past 10 years.  HPV screening.** / Every 3 years from ages 55 years through ages 67 or 77 years with a history of 3 consecutive normal Pap tests. Testing can be stopped between 65 and 70 years with 3 consecutive normal Pap tests and no abnormal Pap or HPV tests in the past 10 years.  Fecal occult blood test (FOBT) of stool. / Every year beginning at age 81 years and continuing until age 22 years. You may not need to do this test if you get a colonoscopy every 10 years.  Flexible sigmoidoscopy or colonoscopy.** / Every 5 years for a flexible sigmoidoscopy or every 10 years for a colonoscopy beginning at age 67 years and continuing until age 22 years.  Hepatitis C blood test.** / For all people born from [birth year] through 1966 and any individual with known risks for hepatitis C.  Osteoporosis screening.** / A one-time screening for women ages 8 years and over and women at risk for fractures or osteoporosis.  Skin self-exam. / Monthly.  Influenza vaccine. / Every year.  Tetanus, diphtheria, and  acellular pertussis (Tdap/Td) vaccine.** / 1 dose of Td every 10 years.  Varicella vaccine.** / Consult your health care provider.  Zoster vaccine.** / 1 dose for adults aged 56 years or older.  Pneumococcal 13-valent conjugate (PCV13) vaccine.** / Consult your health care provider.  Pneumococcal polysaccharide (PPSV23) vaccine.** / 1 dose for all adults aged 15 years and older.  Meningococcal vaccine.** / Consult your health care provider.  Hepatitis A vaccine.** / Consult your health care provider.  Hepatitis B vaccine.** / Consult your health care provider.  Haemophilus influenzae type b (Hib) vaccine.** / Consult your health care provider. ** Family history and personal history of risk and conditions may change your health care provider's recommendations.   This information is not intended to replace advice given to you by your health care provider. Make sure you discuss any questions you have with your health care provider.   Document Released: 01/02/2002 Document Revised: 11/27/2014 Document Reviewed: 04/03/2011 Elsevier Interactive Patient Education [employer].

## 2015-12-20 NOTE — Assessment & Plan Note (Signed)
Uses CPAP nightly 

## 2015-12-20 NOTE — Progress Notes (Signed)
Pre visit review using our clinic review tool, if applicable. No additional management support is needed unless otherwise documented below in the visit note. 

## 2015-12-21 ENCOUNTER — Ambulatory Visit
Admission: RE | Admit: 2015-12-21 | Discharge: 2015-12-21 | Disposition: A | Discharge status: Home / Self Care | Payer: 59 | Source: Ambulatory Visit | Attending: General Surgery | Admitting: General Surgery

## 2015-12-21 ENCOUNTER — Other Ambulatory Visit: Payer: Self-pay | Admitting: Family Medicine

## 2015-12-21 ENCOUNTER — Ambulatory Visit: Admission: RE | Admit: 2015-12-21 | Payer: 59 | Source: Ambulatory Visit

## 2015-12-21 DIAGNOSIS — E559 Vitamin D deficiency, unspecified: Secondary | ICD-10-CM

## 2015-12-21 DIAGNOSIS — D509 Iron deficiency anemia, unspecified: Secondary | ICD-10-CM

## 2015-12-21 DIAGNOSIS — D241 Benign neoplasm of right breast: Secondary | ICD-10-CM

## 2015-12-21 DIAGNOSIS — N6452 Nipple discharge: Secondary | ICD-10-CM | POA: Diagnosis not present

## 2015-12-21 MED ORDER — VITAMIN D (ERGOCALCIFEROL) 1.25 MG (50000 UNIT) PO CAPS: 50000.0000 [IU] | ORAL_CAPSULE | ORAL | Status: DC

## 2015-12-21 NOTE — Telephone Encounter (Signed)
Vit. D refilled and IFOB entered

## 2015-12-26 ENCOUNTER — Encounter: Payer: Self-pay | Admitting: Family Medicine

## 2015-12-26 DIAGNOSIS — Z0000 Encounter for general adult medical examination without abnormal findings: Secondary | ICD-10-CM | POA: Insufficient documentation

## 2015-12-26 DIAGNOSIS — R739 Hyperglycemia, unspecified: Secondary | ICD-10-CM | POA: Insufficient documentation

## 2015-12-26 DIAGNOSIS — E559 Vitamin D deficiency, unspecified: Secondary | ICD-10-CM | POA: Insufficient documentation

## 2015-12-26 HISTORY — DX: Vitamin D deficiency, unspecified: E55.9

## 2015-12-26 NOTE — Assessment & Plan Note (Signed)
Labs reveal deficiency. Start on Vitamin D 50000 IU caps, 1 cap po weekly x 12 weeks. Disp #4 with 4 rf. Also take daily Vitamin D over the counter. If already taking a daily supplement increase by 1000 IU daily and if not start Vitamin D 2000 IU daily.  

## 2015-12-26 NOTE — Assessment & Plan Note (Signed)
RRR today doing well with current meds

## 2015-12-26 NOTE — Assessment & Plan Note (Signed)
Patient encouraged to maintain heart healthy diet, regular exercise, adequate sleep. Consider daily probiotics. Take medications as prescribed. Labs ordered and reviewed. Referred for screening colonoscopy. Follows with GYN for paps.

## 2015-12-26 NOTE — Progress Notes (Signed)
Patient ID: Misty Mahoney, female   DOB: Aug 12, 1966, 50 y.o.   MRN: [id number]   Subjective:    Patient ID: Misty Mahoney, female    DOB: [birth year], 50 y.o.   MRN: [id number]  Chief Complaint  Patient presents with  . Bronchitis  . Annual Exam    HPI Patient is in today for annual exam and follow up on numerous medical conditions. She notes consistent fatigue but denies any other acute concerns. No recent hospitalizations. Has been trying to eat better and increase her activity level but the fatigue has been a roadblock. Denies CP/palp/SOB/HA/congestion/fevers/GI or GU c/o. Taking meds as prescribed  Past Medical History  Diagnosis Date  . SVT (supraventricular tachycardia) ([place])     s/p RFCA 09/26/12  . Hypertension   . Atrial fibrillation ([place])   . OSA (obstructive sleep apnea)   . Chicken pox as a child  . Anemia     gestational  . DM, gestational, diet controlled 04/10/2014  . Obesity, unspecified 04/10/2014  . H/O gestational diabetes mellitus, not currently pregnant 04/10/2014  . History of chicken pox   . Depression 06/15/2015  . Hyperlipidemia 12/20/2015  . Preventative health care 12/26/2015  . Vitamin D deficiency 12/26/2015    Past Surgical History  Procedure Laterality Date  . Cardiac electrophysiology study and ablation  2013  . Tonsillectomy  50 yrs old  . Wisdom tooth extraction  50 yrs old  . Adenoidectomy    . Palate / uvula biopsy / excision    . V-tach ablation N/A 09/26/2012    Procedure: V-TACH ABLATION;  Surgeon: Evans Lance, MD;  Location: [facility] CATH LAB;  Service: Cardiovascular;  Laterality: N/A;  . Endovenous ablation saphenous vein w/ laser Bilateral     Thermal ablation    Family History  Problem Relation Age of Onset  . Hypertension Mother   . Dementia Mother   . Dementia Father   . Hypertension Father   . Cataracts Father     bilateral  . Alzheimer's disease Father   . Cancer Sister 8    metastatic  . Hypertension Brother   . ADD /  ADHD Daughter   . ADD / ADHD Son   . Heart attack Maternal Grandfather     pacemaker  . Cancer Maternal Grandfather     ?  Marland Kitchen Cancer Paternal Grandmother     colon    Social History   Social History  . Marital Status: Legally Separated    Spouse Name: N/A  . Number of Children: N/A  . Years of Education: N/A   Occupational History  . Not on file.   Social History Main Topics  . Smoking status: Never Smoker   . Smokeless tobacco: Never Used  . Alcohol Use: Yes     Comment: two or three times a year  . Drug Use: No  . Sexual Activity:    Partners: Male     Comment: lives with kids, significant other and step daughter. minimizing dairy and gluten, exercise   Other Topics Concern  . Not on file   Social History Narrative    Outpatient Prescriptions Prior to Visit  Medication Sig Dispense Refill  . acetaminophen (TYLENOL) 325 MG tablet Take 650 mg by mouth every 6 (six) hours as needed. For pain    . Cholecalciferol (VITAMIN D) 2000 UNITS CAPS Take by mouth daily.    Marland Kitchen diltiazem (CARTIA XT) 180 MG 24 hr capsule Take 1 capsule (180 mg  total) by mouth daily. 90 capsule 3  . ibuprofen (ADVIL,MOTRIN) 200 MG tablet Take 200 mg by mouth every 6 (six) hours as needed. For pain    . Omega-3 Fatty Acids (FISH OIL PO) Take by mouth daily. Reported on 12/17/2015    . valsartan-hydrochlorothiazide (DIOVAN-HCT) 320-12.5 MG per tablet Take 1 tablet by mouth daily. 90 tablet PRN  . Vitamin D, Ergocalciferol, (DRISDOL) 50000 UNITS CAPS capsule Take 1 capsule (50,000 Units total) by mouth every 7 (seven) days. 4 capsule 4   No facility-administered medications prior to visit.    No Known Allergies  Review of Systems  Constitutional: Positive for malaise/fatigue. Negative for fever and chills.  HENT: Negative for congestion and hearing loss.   Eyes: Negative for discharge.  Respiratory: Negative for cough, sputum production and shortness of breath.   Cardiovascular: Negative for chest  pain, palpitations and leg swelling.  Gastrointestinal: Negative for heartburn, nausea, vomiting, abdominal pain, diarrhea, constipation and blood in stool.  Genitourinary: Negative for dysuria, urgency, frequency and hematuria.  Musculoskeletal: Negative for myalgias, back pain and falls.  Skin: Negative for rash.  Neurological: Negative for dizziness, sensory change, loss of consciousness, weakness and headaches.  Endo/Heme/Allergies: Negative for environmental allergies. Does not bruise/bleed easily.  Psychiatric/Behavioral: Negative for depression and suicidal ideas. The patient is not nervous/anxious and does not have insomnia.        Objective:    Physical Exam  Constitutional: She is oriented to person, place, and time. She appears well-developed and well-nourished. No distress.  HENT:  Head: Normocephalic and atraumatic.  Eyes: Conjunctivae are normal.  Neck: Neck supple. No thyromegaly present.  Cardiovascular: Normal rate, regular rhythm and normal heart sounds.   No murmur heard. Pulmonary/Chest: Effort normal and breath sounds normal. No respiratory distress.  Abdominal: Soft. Bowel sounds are normal. She exhibits no distension and no mass. There is no tenderness.  Musculoskeletal: She exhibits no edema.  Lymphadenopathy:    She has no cervical adenopathy.  Neurological: She is alert and oriented to person, place, and time.  Skin: Skin is warm and dry.  Psychiatric: She has a normal mood and affect. Her behavior is normal.    BP 134/62 mmHg  Pulse 70  Temp(Src) 98.5 F (36.9 C) (Oral)  Ht 5\' 8"  (1.727 m)  Wt 236 lb 8 oz (107.276 kg)  BMI 35.97 kg/m2  SpO2 100%  LMP 12/06/2015 Wt Readings from Last 3 Encounters:  12/20/15 236 lb 8 oz (107.276 kg)  09/22/15 225 lb (102.059 kg)  08/30/15 230 lb (104.327 kg)     Lab Results  Component Value Date   WBC 8.9 12/20/2015   HGB 11.1* 12/20/2015   HCT 34.2* 12/20/2015   PLT 300.0 12/20/2015   GLUCOSE 107*  12/20/2015   CHOL 146 12/20/2015   TRIG 90.0 12/20/2015   HDL 54.10 12/20/2015   LDLCALC 74 12/20/2015   ALT 14 12/20/2015   AST 17 12/20/2015   NA 138 12/20/2015   K 4.7 12/20/2015   CL 102 12/20/2015   CREATININE 0.63 12/20/2015   BUN 19 12/20/2015   CO2 30 12/20/2015   TSH 1.83 12/20/2015   HGBA1C 5.7 12/20/2015    Lab Results  Component Value Date   TSH 1.83 12/20/2015   Lab Results  Component Value Date   WBC 8.9 12/20/2015   HGB 11.1* 12/20/2015   HCT 34.2* 12/20/2015   MCV 85.9 12/20/2015   PLT 300.0 12/20/2015   Lab Results  Component Value Date  NA 138 12/20/2015   K 4.7 12/20/2015   CO2 30 12/20/2015   GLUCOSE 107* 12/20/2015   BUN 19 12/20/2015   CREATININE 0.63 12/20/2015   BILITOT 0.3 12/20/2015   ALKPHOS 40 12/20/2015   AST 17 12/20/2015   ALT 14 12/20/2015   PROT 7.4 12/20/2015   ALBUMIN 3.9 12/20/2015   CALCIUM 9.3 12/20/2015   GFR 106.09 12/20/2015   Lab Results  Component Value Date   CHOL 146 12/20/2015   Lab Results  Component Value Date   HDL 54.10 12/20/2015   Lab Results  Component Value Date   LDLCALC 74 12/20/2015   Lab Results  Component Value Date   TRIG 90.0 12/20/2015   Lab Results  Component Value Date   CHOLHDL 3 12/20/2015   Lab Results  Component Value Date   HGBA1C 5.7 12/20/2015       Assessment & Plan:   Problem List Items Addressed This Visit    Anemia   Relevant Orders   Lipid panel (Completed)   Comprehensive metabolic panel (Completed)   VITAMIN D 25 Hydroxy (Vit-D Deficiency, Fractures) (Completed)   TSH (Completed)   CBC (Completed)   Ambulatory referral to Gastroenterology   Atrial fibrillation ([place])   Relevant Orders   Lipid panel (Completed)   Comprehensive metabolic panel (Completed)   VITAMIN D 25 Hydroxy (Vit-D Deficiency, Fractures) (Completed)   TSH (Completed)   CBC (Completed)   Ambulatory referral to Gastroenterology   History of chicken pox   Relevant Orders   Lipid  panel (Completed)   Comprehensive metabolic panel (Completed)   VITAMIN D 25 Hydroxy (Vit-D Deficiency, Fractures) (Completed)   TSH (Completed)   CBC (Completed)   Ambulatory referral to Gastroenterology   Hyperglycemia    hgba1c acceptable, minimize simple carbs. Increase exercise as tolerated.       Relevant Orders   Hemoglobin A1c (Completed)   Hyperlipidemia    Encouraged heart healthy diet, increase exercise, avoid trans fats, consider a krill oil cap daily      Relevant Orders   Lipid panel (Completed)   Comprehensive metabolic panel (Completed)   VITAMIN D 25 Hydroxy (Vit-D Deficiency, Fractures) (Completed)   TSH (Completed)   CBC (Completed)   Ambulatory referral to Gastroenterology   Hypertension    Well controlled, no changes to meds. Encouraged heart healthy diet such as the DASH diet and exercise as tolerated.       Obesity    Encouraged DASH diet, decrease po intake and increase exercise as tolerated. Needs 7-8 hours of sleep nightly. Avoid trans fats, eat small, frequent meals every 4-5 hours with lean proteins, complex carbs and healthy fats. Minimize simple carbs, GMO foods.      Relevant Orders   Lipid panel (Completed)   Comprehensive metabolic panel (Completed)   VITAMIN D 25 Hydroxy (Vit-D Deficiency, Fractures) (Completed)   TSH (Completed)   CBC (Completed)   Ambulatory referral to Gastroenterology   OSA (obstructive sleep apnea) - Primary    Uses  CPAP nightly       Relevant Orders   Lipid panel (Completed)   Comprehensive metabolic panel (Completed)   VITAMIN D 25 Hydroxy (Vit-D Deficiency, Fractures) (Completed)   TSH (Completed)   CBC (Completed)   Ambulatory referral to Gastroenterology   Preventative health care    Patient encouraged to maintain heart healthy diet, regular exercise, adequate sleep. Consider daily probiotics. Take medications as prescribed. Labs ordered and reviewed. Referred for screening colonoscopy. Follows with GYN  for paps.      SVT (supraventricular tachycardia) ([facility])    RRR today doing well with current meds      Relevant Orders   Lipid panel (Completed)   Comprehensive metabolic panel (Completed)   VITAMIN D 25 Hydroxy (Vit-D Deficiency, Fractures) (Completed)   TSH (Completed)   CBC (Completed)   Ambulatory referral to Gastroenterology   Vitamin D deficiency    Labs reveal deficiency. Start on Vitamin D 50000 IU caps, 1 cap po weekly x 12 weeks. Disp #4 with 4 rf. Also take daily Vitamin D over the counter. If already taking a daily supplement increase by 1000 IU daily and if not start Vitamin D 2000 IU daily.        Other Visit Diagnoses    Colon cancer screening        Relevant Orders    Lipid panel (Completed)    Comprehensive metabolic panel (Completed)    VITAMIN D 25 Hydroxy (Vit-D Deficiency, Fractures) (Completed)    TSH (Completed)    CBC (Completed)    Ambulatory referral to Gastroenterology       I am having Ms. Chrissie Noa maintain her acetaminophen, ibuprofen, Vitamin D, Omega-3 Fatty Acids (FISH OIL PO), diltiazem, and valsartan-hydrochlorothiazide.  No orders of the defined types were placed in this encounter.     Penni Homans, MD

## 2015-12-26 NOTE — Assessment & Plan Note (Signed)
Well controlled, no changes to meds. Encouraged heart healthy diet such as the DASH diet and exercise as tolerated.  °

## 2015-12-26 NOTE — Assessment & Plan Note (Signed)
Encouraged DASH diet, decrease po intake and increase exercise as tolerated. Needs 7-8 hours of sleep nightly. Avoid trans fats, eat small, frequent meals every 4-5 hours with lean proteins, complex carbs and healthy fats. Minimize simple carbs, GMO foods. 

## 2015-12-26 NOTE — Assessment & Plan Note (Signed)
Encouraged heart healthy diet, increase exercise, avoid trans fats, consider a krill oil cap daily 

## 2015-12-26 NOTE — Assessment & Plan Note (Signed)
hgba1c acceptable, minimize simple carbs. Increase exercise as tolerated.  

## 2015-12-28 DIAGNOSIS — H52222 Regular astigmatism, left eye: Secondary | ICD-10-CM | POA: Diagnosis not present

## 2015-12-28 DIAGNOSIS — H524 Presbyopia: Secondary | ICD-10-CM | POA: Diagnosis not present

## 2015-12-28 DIAGNOSIS — H43392 Other vitreous opacities, left eye: Secondary | ICD-10-CM | POA: Diagnosis not present

## 2015-12-28 DIAGNOSIS — H5211 Myopia, right eye: Secondary | ICD-10-CM | POA: Diagnosis not present

## 2016-01-07 DIAGNOSIS — K429 Umbilical hernia without obstruction or gangrene: Secondary | ICD-10-CM | POA: Diagnosis not present

## 2016-01-07 DIAGNOSIS — N6452 Nipple discharge: Secondary | ICD-10-CM | POA: Diagnosis not present

## 2016-01-17 ENCOUNTER — Telehealth: Payer: Self-pay | Admitting: Family Medicine

## 2016-01-17 NOTE — Telephone Encounter (Signed)
Flu shot has been documented previously.

## 2016-01-17 NOTE — Telephone Encounter (Signed)
PATIENT IS A [employer] EM[employer]OYEE AND HAD HER FLU SHOT IN September  PLEASE UPDATE HER RECORDS

## 2016-01-30 ENCOUNTER — Encounter: Payer: Self-pay | Admitting: Sports Medicine

## 2016-01-31 ENCOUNTER — Encounter: Payer: Self-pay | Admitting: *Deleted

## 2016-01-31 ENCOUNTER — Encounter: Payer: Self-pay | Admitting: Sports Medicine

## 2016-02-07 ENCOUNTER — Encounter (HOSPITAL_COMMUNITY): Payer: Self-pay | Admitting: Emergency Medicine

## 2016-02-07 ENCOUNTER — Emergency Department (HOSPITAL_COMMUNITY)
Admission: EM | Admit: 2016-02-07 | Discharge: 2016-02-07 | Disposition: A | Discharge status: Home / Self Care | Payer: 59 | Attending: Emergency Medicine | Admitting: Emergency Medicine

## 2016-02-07 ENCOUNTER — Encounter: Payer: Self-pay | Admitting: Internal Medicine

## 2016-02-07 DIAGNOSIS — Z79899 Other long term (current) drug therapy: Secondary | ICD-10-CM | POA: Diagnosis not present

## 2016-02-07 DIAGNOSIS — I480 Paroxysmal atrial fibrillation: Secondary | ICD-10-CM | POA: Diagnosis not present

## 2016-02-07 DIAGNOSIS — I10 Essential (primary) hypertension: Secondary | ICD-10-CM | POA: Insufficient documentation

## 2016-02-07 DIAGNOSIS — Z8669 Personal history of other diseases of the nervous system and sense organs: Secondary | ICD-10-CM | POA: Diagnosis not present

## 2016-02-07 DIAGNOSIS — E669 Obesity, unspecified: Secondary | ICD-10-CM | POA: Diagnosis not present

## 2016-02-07 DIAGNOSIS — Z862 Personal history of diseases of the blood and blood-forming organs and certain disorders involving the immune mechanism: Secondary | ICD-10-CM | POA: Diagnosis not present

## 2016-02-07 DIAGNOSIS — Z8619 Personal history of other infectious and parasitic diseases: Secondary | ICD-10-CM | POA: Insufficient documentation

## 2016-02-07 DIAGNOSIS — R002 Palpitations: Secondary | ICD-10-CM | POA: Diagnosis present

## 2016-02-07 LAB — CBC WITH DIFFERENTIAL/PLATELET
BASOS ABS: 0 10*3/uL (ref 0.0–0.1)
Basophils Relative: 0 %
EOS ABS: 0.1 10*3/uL (ref 0.0–0.7)
Eosinophils Relative: 1 %
HCT: 37.1 % (ref 36.0–46.0)
Hemoglobin: 12.1 g/dL (ref 12.0–15.0)
LYMPHS ABS: 2.4 10*3/uL (ref 0.7–4.0)
LYMPHS PCT: 27 %
MCH: 28.6 pg (ref 26.0–34.0)
MCHC: 32.6 g/dL (ref 30.0–36.0)
MCV: 87.7 fL (ref 78.0–100.0)
Monocytes Absolute: 0.3 10*3/uL (ref 0.1–1.0)
Monocytes Relative: 4 %
NEUTROS PCT: 68 %
Neutro Abs: 6.2 10*3/uL (ref 1.7–7.7)
Platelets: 338 10*3/uL (ref 150–400)
RBC: 4.23 MIL/uL (ref 3.87–5.11)
RDW: 14.2 % (ref 11.5–15.5)
WBC: 9 10*3/uL (ref 4.0–10.5)

## 2016-02-07 LAB — BASIC METABOLIC PANEL
Anion gap: 10 (ref 5–15)
BUN: 21 mg/dL — ABNORMAL HIGH (ref 6–20)
CO2: 27 mmol/L (ref 22–32)
CREATININE: 0.73 mg/dL (ref 0.44–1.00)
Calcium: 9.6 mg/dL (ref 8.9–10.3)
Chloride: 105 mmol/L (ref 101–111)
GFR calc non Af Amer: >60 mL/min (ref >60)
Glucose, Bld: 126 mg/dL — ABNORMAL HIGH (ref 65–99)
POTASSIUM: 4.1 mmol/L (ref 3.5–5.1)
SODIUM: 142 mmol/L (ref 135–145)

## 2016-02-07 MED ORDER — SODIUM CHLORIDE 0.9 % IV BOLUS (SEPSIS)
1000.0000 mL | Freq: Once | INTRAVENOUS | Status: AC
  Administered 2016-02-07: 1000 mL via INTRAVENOUS

## 2016-02-07 MED ORDER — FLECAINIDE ACETATE 100 MG PO TABS
300.0000 mg | ORAL_TABLET | Freq: Once | ORAL | Status: AC
  Administered 2016-02-07: 300 mg via ORAL
  Filled 2016-02-07: qty 3

## 2016-02-07 MED FILL — VIT D2 1.25 MG (50,000 UNIT: 1.25 MG | 84 days supply | Qty: 12 | Fill #0

## 2016-02-07 MED FILL — CARTIA XT 180 MG CAPSULE SA: 180 | 90 days supply | Qty: 90 | Fill #1

## 2016-02-07 NOTE — ED Provider Notes (Signed)
CSN: [id number]     Arrival date & time 02/07/16  [id number] History   First MD Initiated Contact with Patient 02/07/16 [phone]     Chief Complaint  Patient presents with  . Atrial Fibrillation     (Consider location/radiation/quality/duration/timing/severity/associated sxs/prior Treatment) HPI  50 year old female with a remote history of SVT and current paroxysmal A. fib presents with atrial fibrillation since 6 PM last night. She states she is very sensitive to this and can always feel when she goes in and out of it. She has been having palpitations continuously since 6 PM. She tried a dose of her 25 mg metoprolol succinate with no relief. This morning she tried her husband's 50 mg metoprolol tartrate, this also did not help. She took her Cardizem and Diovan leg normal. She states she has started some vitamins recently over the last couple days and is question whether or not this has caused her to go into A. fib. She has had no ablation before. Her like a physiologist is Dr. Caryl Comes and she has a prescription from him that asks for patient to be given flecanide 300 mg by mouth 1 if she is in persistent A. fib. Patient states she feels dizzy like typical whenever she has A. fib. She tried multiple vagal maneuvers such as coughing and bearing down with no relief. Denies chest pain.  Past Medical History  Diagnosis Date  . SVT (supraventricular tachycardia) ([place])     s/p RFCA 09/26/12  . Hypertension   . Atrial fibrillation ([place])   . OSA (obstructive sleep apnea)   . Chicken pox as a child  . Anemia     gestational  . DM, gestational, diet controlled 04/10/2014  . Obesity, unspecified 04/10/2014  . H/O gestational diabetes mellitus, not currently pregnant 04/10/2014  . History of chicken pox   . Depression 06/15/2015  . Hyperlipidemia 12/20/2015  . Preventative health care 12/26/2015  . Vitamin D deficiency 12/26/2015   Past Surgical History  Procedure Laterality Date  . Cardiac electrophysiology study  and ablation  2013  . Tonsillectomy  50 yrs old  . Wisdom tooth extraction  50 yrs old  . Adenoidectomy    . Palate / uvula biopsy / excision    . V-tach ablation N/A 09/26/2012    Procedure: V-TACH ABLATION;  Surgeon: Evans Lance, MD;  Location: [facility] CATH LAB;  Service: Cardiovascular;  Laterality: N/A;  . Endovenous ablation saphenous vein w/ laser Bilateral     Thermal ablation   Family History  Problem Relation Age of Onset  . Hypertension Mother   . Dementia Mother   . Dementia Father   . Hypertension Father   . Cataracts Father     bilateral  . Alzheimer's disease Father   . Cancer Sister 6    metastatic  . Hypertension Brother   . ADD / ADHD Daughter   . ADD / ADHD Son   . Heart attack Maternal Grandfather     pacemaker  . Cancer Maternal Grandfather     ?  Marland Kitchen Cancer Paternal Grandmother     colon   Social History  Substance Use Topics  . Smoking status: Never Smoker   . Smokeless tobacco: Never Used  . Alcohol Use: Yes     Comment: two or three times a year   OB History    No data available     Review of Systems  Constitutional: Negative for fever.  Cardiovascular: Positive for palpitations. Negative for chest pain and  leg swelling.  Gastrointestinal: Negative for vomiting.  Neurological: Positive for dizziness.  All other systems reviewed and are negative.     Allergies  Review of patient's allergies indicates no known allergies.  Home Medications   Prior to Admission medications   Medication Sig Start Date End Date Taking? Authorizing Provider  acetaminophen (TYLENOL) 325 MG tablet Take 650 mg by mouth every 6 (six) hours as needed. For pain    Historical Provider, MD  Cholecalciferol (VITAMIN D) 2000 UNITS CAPS Take by mouth daily.    Historical Provider, MD  diltiazem (CARTIA XT) 180 MG 24 hr capsule Take 1 capsule (180 mg total) by mouth daily. 06/15/15   Mosie Lukes, MD  ibuprofen (ADVIL,MOTRIN) 200 MG tablet Take 200 mg by mouth every 6  (six) hours as needed. For pain    Historical Provider, MD  Omega-3 Fatty Acids (FISH OIL PO) Take by mouth daily. Reported on 12/17/2015    Historical Provider, MD  valsartan-hydrochlorothiazide (DIOVAN-HCT) 320-12.5 MG per tablet Take 1 tablet by mouth daily. 06/15/15   Mosie Lukes, MD  Vitamin D, Ergocalciferol, (DRISDOL) 50000 units CAPS capsule Take 1 capsule (50,000 Units total) by mouth every 7 (seven) days. 12/21/15   Mosie Lukes, MD   BP 129/74 mmHg  Pulse 118  Temp(Src) 97.7 F (36.5 C) (Oral)  Resp 18  Ht 5\' 8"  (1.727 m)  Wt 224 lb (101.606 kg)  BMI 34.07 kg/m2  SpO2 97% Physical Exam  Constitutional: She is oriented to person, place, and time. She appears well-developed and well-nourished.  HENT:  Head: Normocephalic and atraumatic.  Right Ear: External ear normal.  Left Ear: External ear normal.  Nose: Nose normal.  Eyes: Right eye exhibits no discharge. Left eye exhibits no discharge.  Cardiovascular: Normal heart sounds.  An irregularly irregular rhythm present. Tachycardia present.   HR in 110s  Pulmonary/Chest: Effort normal and breath sounds normal. She has no wheezes. She has no rales.  Abdominal: Soft. There is no tenderness.  Musculoskeletal: She exhibits no edema.  Neurological: She is alert and oriented to person, place, and time.  Skin: Skin is warm and dry.  Nursing note and vitals reviewed.   ED Course  Procedures (including critical care time) Labs Review Labs Reviewed  BASIC METABOLIC PANEL - Abnormal; Notable for the following:    Glucose, Bld 126 (*)    BUN 21 (*)    All other components within normal limits  CBC WITH DIFFERENTIAL/PLATELET    Imaging Review No results found. I have personally reviewed and evaluated these images and lab results as part of my medical decision-making.   EKG Interpretation   Date/Time:  Monday February 07 2016 09:30:06 EDT Ventricular Rate:  120 PR Interval:    QRS Duration: 84 QT Interval:  318 QTC  Calculation: 449 R Axis:   7 Text Interpretation:  Atrial fibrillation with rapid ventricular response  with premature ventricular or aberrantly conducted complexes Nonspecific  ST abnormality Abnormal ECG afib appears new compared to 2013 Confirmed by  Tymira Horkey  MD, [place] ([id number]) on 02/07/2016 9:46:53 AM       EKG Interpretation  Date/Time:  Monday February 07 2016 09:53:09 EDT Ventricular Rate:  155 PR Interval:    QRS Duration: 91 QT Interval:  334 QTC Calculation: 536 R Axis:   -8 Text Interpretation:  Atrial flutter with predominant 2:1 AV block Borderline repolarization abnormality Prolonged QT interval rate is faster compared to earlier in the day Confirmed by Tony Friscia  MD, Semiah Konczal [phone]) on 02/07/2016 10:05:57 AM        EKG Interpretation  Date/Time:  Monday February 07 2016 12:14:44 EDT Ventricular Rate:  66 PR Interval:  166 QRS Duration: 105 QT Interval:  409 QTC Calculation: 428 R Axis:   29 Text Interpretation:  Sinus rhythm Consider right atrial enlargement atrial fib/flutter is no longer present Confirmed by [place] ([id number]) on 02/07/2016 12:20:06 PM       MDM   Final diagnoses:  Paroxysmal atrial fibrillation ([place])    Patient converted after being given po Flecainide. No anginal symptoms. No other concerning findings. BP has remained stable. HR now in 60s. She would like to be discharged, advised her to f/u closely with her EP physician     Sherwood Gambler, MD 02/07/16 1225

## 2016-02-07 NOTE — ED Notes (Signed)
Up to br

## 2016-02-07 NOTE — Telephone Encounter (Signed)
I called the patient. She is currently in the ED due to afib.  She asked about FMLA paper work/procedure. I adv her to go to [employer] for paperwork. Advised to bring papers to office with payment that [employer] charges, we will notify her when complete.

## 2016-02-07 NOTE — ED Notes (Signed)
Pt sts in afib with hx of same since last night; pt sts some palpitations and took metoprolol at home without relief; pt sts ablation in past

## 2016-02-10 DIAGNOSIS — I83811 Varicose veins of right lower extremities with pain: Secondary | ICD-10-CM | POA: Diagnosis not present

## 2016-02-10 DIAGNOSIS — I8311 Varicose veins of right lower extremity with inflammation: Secondary | ICD-10-CM | POA: Diagnosis not present

## 2016-02-10 DIAGNOSIS — I87321 Chronic venous hypertension (idiopathic) with inflammation of right lower extremity: Secondary | ICD-10-CM | POA: Diagnosis not present

## 2016-02-14 ENCOUNTER — Encounter: Payer: Self-pay | Admitting: Internal Medicine

## 2016-02-14 DIAGNOSIS — M25562 Pain in left knee: Secondary | ICD-10-CM | POA: Diagnosis not present

## 2016-02-15 ENCOUNTER — Other Ambulatory Visit: Payer: Self-pay | Admitting: *Deleted

## 2016-02-15 MED ORDER — FLECAINIDE ACETATE 150 MG PO TABS: ORAL_TABLET | ORAL | Status: DC

## 2016-03-08 DIAGNOSIS — I8311 Varicose veins of right lower extremity with inflammation: Secondary | ICD-10-CM | POA: Diagnosis not present

## 2016-03-14 MED FILL — VALSARTAN-HCTZ 320-12.5 MG: 320-12.5 | 90 days supply | Qty: 90 | Fill #3

## 2016-03-15 MED FILL — FLECAINIDE ACETATE 150 MG T: 150 | 4 days supply | Qty: 8 | Fill #0

## 2016-03-17 DIAGNOSIS — G8929 Other chronic pain: Secondary | ICD-10-CM | POA: Diagnosis not present

## 2016-03-17 DIAGNOSIS — M25562 Pain in left knee: Secondary | ICD-10-CM | POA: Diagnosis not present

## 2016-03-23 DIAGNOSIS — S83242A Other tear of medial meniscus, current injury, left knee, initial encounter: Secondary | ICD-10-CM | POA: Diagnosis not present

## 2016-03-27 ENCOUNTER — Encounter: Payer: Self-pay | Admitting: Internal Medicine

## 2016-03-31 ENCOUNTER — Encounter: Payer: Self-pay | Admitting: Pulmonary Disease

## 2016-03-31 ENCOUNTER — Ambulatory Visit (INDEPENDENT_AMBULATORY_CARE_PROVIDER_SITE_OTHER): Payer: 59 | Admitting: Pulmonary Disease

## 2016-03-31 ENCOUNTER — Telehealth: Payer: Self-pay | Admitting: Pulmonary Disease

## 2016-03-31 VITALS — BP 142/82 | HR 72 | Ht 68.0 in | Wt 239.0 lb

## 2016-03-31 DIAGNOSIS — E669 Obesity, unspecified: Secondary | ICD-10-CM

## 2016-03-31 DIAGNOSIS — G4733 Obstructive sleep apnea (adult) (pediatric): Secondary | ICD-10-CM

## 2016-03-31 DIAGNOSIS — R0609 Other forms of dyspnea: Secondary | ICD-10-CM

## 2016-03-31 NOTE — Assessment & Plan Note (Signed)
Likely 2/2 obesity/RVD. Non smoker. Will observe.

## 2016-03-31 NOTE — Telephone Encounter (Signed)
   sheena -- Forgot to tell you, can you ask DME for pt to have "EPR +3" on her autocpap so she can tolerate it more?  Thanks.  AD

## 2016-03-31 NOTE — Progress Notes (Signed)
Subjective:    Patient ID: Misty Mahoney, female    DOB: [birth year], 50 y.o.   MRN: [id number]  HPI   This is the case of Misty Mahoney, 50 y.o. Female, who was referred by Dr. Willette Alma  in consultation regarding OSA. .   As you very well know, patient is a non smoker.  Not known to have asthma or copd.  Pt sees Dr. Caryl Comes for cardiac issues. Has afib/SVT, had ablation for SVT. Afib is paroxysmal.  Pt has OSA. Was dxed with severe OSA. Very symptomatic.  Had UPPP surgery in 2011.  Pt has a cpap machine, got in 2005. Had a rpt sleep study in 2013 which showed AHI 43.  Machine is not functioning well. Sometimes it does not deliver enough pressure. Humidifier is not working.  No supplies recently.  Has hypersomnia recently. Hypersomnia affects fxnality.         Review of Systems  Constitutional: Negative.  Negative for fever and unexpected weight change.  HENT: Negative.  Negative for congestion, dental problem, ear pain, nosebleeds, postnasal drip, rhinorrhea, sinus pressure, sneezing, sore throat and trouble swallowing.   Eyes: Negative.  Negative for redness and itching.  Respiratory: Negative.  Negative for cough, chest tightness, shortness of breath and wheezing.   Cardiovascular: Positive for leg swelling. Negative for palpitations.  Gastrointestinal: Negative.  Negative for nausea and vomiting.  Endocrine: Negative.   Genitourinary: Negative.  Negative for dysuria.  Musculoskeletal: Positive for myalgias and arthralgias. Negative for joint swelling.  Skin: Negative.  Negative for rash.  Allergic/Immunologic: Negative.   Neurological: Negative.  Negative for headaches.  Hematological: Negative.  Does not bruise/bleed easily.  Psychiatric/Behavioral: Negative.  Negative for dysphoric mood. The patient is not nervous/anxious.    Past Medical History  Diagnosis Date  . SVT (supraventricular tachycardia) ([place])     s/p RFCA 09/26/12  . Hypertension   . Atrial  fibrillation ([place])   . OSA (obstructive sleep apnea)   . Chicken pox as a child  . Anemia     gestational  . DM, gestational, diet controlled 04/10/2014  . Obesity, unspecified 04/10/2014  . H/O gestational diabetes mellitus, not currently pregnant 04/10/2014  . History of chicken pox   . Depression 06/15/2015  . Hyperlipidemia 12/20/2015  . Preventative health care 12/26/2015  . Vitamin D deficiency 12/26/2015   (-) DVT, CA  Family History  Problem Relation Age of Onset  . Hypertension Mother   . Dementia Mother   . Dementia Father   . Hypertension Father   . Cataracts Father     bilateral  . Alzheimer's disease Father   . Cancer Sister 77    metastatic  . Hypertension Brother   . ADD / ADHD Daughter   . ADD / ADHD Son   . Heart attack Maternal Grandfather     pacemaker  . Cancer Maternal Grandfather     ?  Marland Kitchen Cancer Paternal Grandmother     colon     Past Surgical History  Procedure Laterality Date  . Cardiac electrophysiology study and ablation  2013  . Tonsillectomy  50 yrs old  . Wisdom tooth extraction  50 yrs old  . Adenoidectomy    . Palate / uvula biopsy / excision    . V-tach ablation N/A 09/26/2012    Procedure: V-TACH ABLATION;  Surgeon: Evans Lance, MD;  Location: [facility] CATH LAB;  Service: Cardiovascular;  Laterality: N/A;  . Endovenous ablation saphenous vein w/  laser Bilateral     Thermal ablation    Social History   Social History  . Marital Status: Legally Separated    Spouse Name: N/A  . Number of Children: N/A  . Years of Education: N/A   Occupational History  . Not on file.   Social History Main Topics  . Smoking status: Never Smoker   . Smokeless tobacco: Never Used  . Alcohol Use: Yes     Comment: two or three times a year  . Drug Use: No  . Sexual Activity:    Partners: Male     Comment: lives with kids, significant other and step daughter. minimizing dairy and gluten, exercise   Other Topics Concern  . Not on file   Social History  Narrative    Is a surgery nurse.    No Known Allergies   Outpatient Prescriptions Prior to Visit  Medication Sig Dispense Refill  . acetaminophen (TYLENOL) 325 MG tablet Take 650 mg by mouth every 6 (six) hours as needed. For pain    . Cholecalciferol (VITAMIN D) 2000 UNITS CAPS Take by mouth daily.    Marland Kitchen diltiazem (CARTIA XT) 180 MG 24 hr capsule Take 1 capsule (180 mg total) by mouth daily. 90 capsule 3  . flecainide (TAMBOCOR) 150 MG tablet Take two tablets (300 mg) by mouth x 1 dose for breakthrough of a-fib- do not take more than 1 dose (300 mg) in a 24 hour period 8 tablet 1  . ibuprofen (ADVIL,MOTRIN) 200 MG tablet Take 200 mg by mouth every 6 (six) hours as needed. For pain    . Omega-3 Fatty Acids (FISH OIL PO) Take by mouth daily. Reported on 12/17/2015    . valsartan-hydrochlorothiazide (DIOVAN-HCT) 320-12.5 MG per tablet Take 1 tablet by mouth daily. 90 tablet PRN  . Vitamin D, Ergocalciferol, (DRISDOL) 50000 units CAPS capsule Take 1 capsule (50,000 Units total) by mouth every 7 (seven) days. 12 capsule 1   No facility-administered medications prior to visit.   No orders of the defined types were placed in this encounter.          Objective:   Physical Exam  Vitals:  Filed Vitals:   03/31/16 1604  BP: 142/82  Pulse: 72  Height: 5\' 8"  (1.727 m)  Weight: 239 lb (108.41 kg)  SpO2: 97%   Neck circ : 15 inches.   Constitutional/General:  Pleasant, well-nourished, well-developed, not in any distress,  Comfortably seating.  Well kempt  Body mass index is 36.35 kg/(m^2). Wt Readings from Last 3 Encounters:  03/31/16 239 lb (108.41 kg)  02/07/16 224 lb (101.606 kg)  12/20/15 236 lb 8 oz (107.276 kg)       HEENT: Pupils equal and reactive to light and accommodation. Anicteric sclerae. Normal nasal mucosa.   No oral  lesions,  mouth clear,  oropharynx clear, no postnasal drip. (-) Oral thrush. No dental caries.  Airway - Mallampati class III  Neck: No masses.  Midline trachea. No JVD, (-) LAD. (-) bruits appreciated.  Respiratory/Chest: Grossly normal chest. (-) deformity. (-) Accessory muscle use.  Symmetric expansion. (-) Tenderness on palpation.  Resonant on percussion.  Diminished BS on both lower lung zones. (-) wheezing, crackles, rhonchi (-) egophony  Cardiovascular: Regular rate and  rhythm, heart sounds normal, no murmur or gallops, no peripheral edema  Gastrointestinal:  Normal bowel sounds. Soft, non-tender. No hepatosplenomegaly.  (-) masses.   Musculoskeletal:  Normal muscle tone. Normal gait.   Extremities: Grossly normal. (-) clubbing,  cyanosis.  (-) edema  Skin: (-) rash,lesions seen.   Neurological/Psychiatric : alert, oriented to time, place, person. Normal mood and affect            Assessment & Plan:  OSA (obstructive sleep apnea) Pt has OSA. Was dxed with moderate-severe OSA. Very symptomatic.  Had UPPP surgery in 2011.  Pt has a cpap machine, got in 2005. Had a rpt sleep study in 2013 which showed AHI 43.  Machine is not functioning well. Sometimes it does not deliver enough pressure. Humidifier is not working.  No supplies recently.  Has hypersomnia recently. Hypersomnia affects fxnality.  ESS 12. Neck circ 15 inches  Plan : 1. Plan for auto CPAP 5-20 cm water. Patient was on 16 cm water and tolerated it. She had a lot of leak on her previous mask. Likely will need EPR +3 as she will require a lot of pressure.  2. Needs good compliance 2/2 paroxysmal afib/svt. Not on [place].  3. May need bipap if not tolerating pressure.   Obesity Weight reduction  Exertional dyspnea Likely 2/2 obesity/RVD. Non smoker. Will observe.      Thank you very much for letting me participate in this patient's care. Please do not hesitate to give me a call if you have any questions or concerns regarding the treatment plan.   Patient will follow up with me in 3 mos.     Monica Becton, MD 03/31/2016    4:47 PM Pulmonary and [place] Pager: [phone] Office: [phone], Fax: [phone]

## 2016-03-31 NOTE — Telephone Encounter (Signed)
PCC's can this be added or does she need new order? Thanks!

## 2016-03-31 NOTE — Patient Instructions (Signed)
1. We will order you an auto CPAP machine, 5-20 cm water. Let us know if you don't receive a call from the DME company. 2. Use your CPAP machine. 3. Let us know if her having issues with CPAP.   Return to clinic in 3-4 mos.

## 2016-03-31 NOTE — Assessment & Plan Note (Signed)
Weight reduction 

## 2016-03-31 NOTE — Assessment & Plan Note (Signed)
Pt has OSA. Was dxed with moderate-severe OSA. Very symptomatic.  Had UPPP surgery in 2011.  Pt has a cpap machine, got in 2005. Had a rpt sleep study in 2013 which showed AHI 43.  Machine is not functioning well. Sometimes it does not deliver enough pressure. Humidifier is not working.  No supplies recently.  Has hypersomnia recently. Hypersomnia affects fxnality.  ESS 12. Neck circ 15 inches  Plan : 1. Plan for auto CPAP 5-20 cm water. Patient was on 16 cm water and tolerated it. She had a lot of leak on her previous mask. Likely will need EPR +3 as she will require a lot of pressure.  2. Needs good compliance 2/2 paroxysmal afib/svt. Not on [place].  3. May need bipap if not tolerating pressure.

## 2016-04-03 NOTE — Telephone Encounter (Signed)
We can't add anything to an order.  A new order will have to be added.

## 2016-04-22 ENCOUNTER — Telehealth: Payer: Self-pay | Admitting: Cardiology

## 2016-04-22 NOTE — Telephone Encounter (Signed)
Patent called stating that she has a history of afib.  She was in ER in March and was given flecainide 300mg  and converted to NSR.  She was given a Rx by Dr. Caryl Comes for flecainide 150mg  to take 2 tablets for atrial fibrillation not to exceed 300mg  in 24 hour period but she misplaced the Rx.  She drank a lot of tea today and MSG in her food and around 3pm today went into atrial fibrillation and remains in it at this time.  She would like a Rx for Flecainide called into [facility] on [address].  Rx called in for Flecaidine 150mg  tablets - take 2 tablets x 1 dose for breakthrough of afib - do not take more than 1 dose in 24 hour period.  She was instructed to call back in am if she has not converted back to NSR.

## 2016-04-24 ENCOUNTER — Other Ambulatory Visit: Payer: Self-pay | Admitting: Obstetrics and Gynecology

## 2016-04-24 DIAGNOSIS — N939 Abnormal uterine and vaginal bleeding, unspecified: Secondary | ICD-10-CM | POA: Diagnosis not present

## 2016-04-24 DIAGNOSIS — G4733 Obstructive sleep apnea (adult) (pediatric): Secondary | ICD-10-CM | POA: Diagnosis not present

## 2016-05-01 DIAGNOSIS — M25562 Pain in left knee: Secondary | ICD-10-CM | POA: Diagnosis not present

## 2016-05-01 DIAGNOSIS — S83242A Other tear of medial meniscus, current injury, left knee, initial encounter: Secondary | ICD-10-CM | POA: Diagnosis not present

## 2016-05-15 MED FILL — CARTIA XT 180 MG CAPSULE SA: 180 | 90 days supply | Qty: 90 | Fill #2

## 2016-05-24 DIAGNOSIS — G4733 Obstructive sleep apnea (adult) (pediatric): Secondary | ICD-10-CM | POA: Diagnosis not present

## 2016-06-06 DIAGNOSIS — N925 Other specified irregular menstruation: Secondary | ICD-10-CM | POA: Diagnosis not present

## 2016-06-13 MED FILL — VALSARTAN-HCTZ 320-12.5 MG: 320-12.5 | 90 days supply | Qty: 90 | Fill #0

## 2016-06-19 ENCOUNTER — Ambulatory Visit: Payer: 59 | Admitting: Family Medicine

## 2016-06-24 DIAGNOSIS — G4733 Obstructive sleep apnea (adult) (pediatric): Secondary | ICD-10-CM | POA: Diagnosis not present

## 2016-07-07 DIAGNOSIS — M25562 Pain in left knee: Secondary | ICD-10-CM | POA: Diagnosis not present

## 2016-07-25 ENCOUNTER — Ambulatory Visit (INDEPENDENT_AMBULATORY_CARE_PROVIDER_SITE_OTHER): Payer: 59 | Admitting: Pulmonary Disease

## 2016-07-25 ENCOUNTER — Telehealth: Payer: Self-pay | Admitting: Pulmonary Disease

## 2016-07-25 ENCOUNTER — Encounter: Payer: Self-pay | Admitting: Pulmonary Disease

## 2016-07-25 VITALS — BP 158/92 | HR 77 | Ht 68.0 in | Wt 238.0 lb

## 2016-07-25 DIAGNOSIS — E669 Obesity, unspecified: Secondary | ICD-10-CM

## 2016-07-25 DIAGNOSIS — G4733 Obstructive sleep apnea (adult) (pediatric): Secondary | ICD-10-CM | POA: Diagnosis not present

## 2016-07-25 NOTE — Assessment & Plan Note (Addendum)
Pt has OSA. Was dxed with moderate-severe OSA. Very symptomatic.  Had UPPP surgery in 2011.  Pt has a cpap machine, got in 2005. Had a rpt sleep study in 2013 which showed AHI 43.  Machine is not functioning well. Sometimes it does not deliver enough pressure. Humidifier is not working.  No supplies recently.  Has hypersomnia recently. Hypersomnia affects fxnality.  We started her on autocpap 5-20 cm water in May 2017. Feels better using it. More energy. Less hypersomnia. DL  last month: 40%, AHI 2.7. She feels pressure might be too much.  Plan:  We extensively discussed the importance of treating OSA and the need to use PAP therapy.   Continue with autocpap > will change settings to 5-16 cm water from 5-20 cm water. Patient has an app on her phone at monitor her compliance. Needs compliance in 1 month. Needs good compliance secondary to A. Fib/ SVT and she's not on [place].   Patient was instructed to have mask, tubings, filter, reservoir cleaned at least once a week with soapy water.  Patient was instructed to call the office if he/she is having issues with the PAP device.    I advised patient to obtain sufficient amount of sleep --  7 to 8 hours at least in a 24 hr period.  Patient was advised to follow good sleep hygiene.  Patient was advised NOT to engage in activities requiring concentration and/or vigilance if he/she is and  sleepy.  Patient is NOT to drive if he/she is sleepy.

## 2016-07-25 NOTE — Telephone Encounter (Signed)
   Misty Mahoney :  Patient needs a 1 month download on CPAP 5-16 cm water. Thanks  J. Shirl Harris, MD 07/25/2016, 5:08 PM [place] [facility] Pager [phone] After 3 pm or if no answer, call [phone]

## 2016-07-25 NOTE — Assessment & Plan Note (Signed)
Weight reduction 

## 2016-07-25 NOTE — Telephone Encounter (Signed)
Reminder for DL placed in tickle file. Nothing further needed.

## 2016-07-25 NOTE — Patient Instructions (Signed)
  It was a pleasure taking care of you today!  Continue using your CPAP machine.  We will try to decrease your pressure  5-16 centimeters water.  Please make sure you use your CPAP device everytime you sleep.  We will monitor the usage of your machine per your insurance requirement.  Your insurance company may take the machine from you if you are not using it regularly.   Please clean the mask, tubings, filter, water reservoir with soapy water every week.  Please use distilled water for the water reservoir.   Please call the office or your machine provider (DME company) if you are having issues with the device.   Return to clinic in 1 year.

## 2016-07-25 NOTE — Progress Notes (Signed)
Subjective:    Patient ID: Misty Mahoney, female    DOB: 1966/05/26, 50 y.o.   MRN: [id number]  HPI   This is the case of Misty Mahoney, 50 y.o. Female, who was referred by Dr. Willette Alma  in consultation regarding OSA. .   As you very well know, patient is a non smoker.  Not known to have asthma or copd.  Pt sees Dr. Caryl Comes for cardiac issues. Has afib/SVT, had ablation for SVT. Afib is paroxysmal.  Pt has OSA. Was dxed with severe OSA. Very symptomatic.  Had UPPP surgery in 2011.  Pt has a cpap machine, got in 2005. Had a rpt sleep study in 2013 which showed AHI 43.  Machine is not functioning well. Sometimes it does not deliver enough pressure. Humidifier is not working.  No supplies recently.  Has hypersomnia recently. Hypersomnia affects fxnality.   ROV 07/25/16 Patient is here as follow-up on her sleep apnea. Since last seen, she states, she has been doing well. Uses her CPAP. Feels better using it. More energy. Less sleepiness. Download the last month: 40%, AHI 2.7. She feels pressure is too much so she ends up removing the cpap machine. Slowly getting used to cpap. Has not been admitted nor been on antibiotics since last seen.    Review of Systems  Constitutional: Negative.  Negative for fever and unexpected weight change.  HENT: Negative.  Negative for congestion, dental problem, ear pain, nosebleeds, postnasal drip, rhinorrhea, sinus pressure, sneezing, sore throat and trouble swallowing.   Eyes: Negative.  Negative for redness and itching.  Respiratory: Negative.  Negative for cough, chest tightness, shortness of breath and wheezing.   Cardiovascular: Positive for leg swelling. Negative for palpitations.  Gastrointestinal: Negative.  Negative for nausea and vomiting.  Endocrine: Negative.   Genitourinary: Negative.  Negative for dysuria.  Musculoskeletal: Positive for arthralgias and myalgias. Negative for joint swelling.  Skin: Negative.  Negative for rash.    Allergic/Immunologic: Negative.   Neurological: Negative.  Negative for headaches.  Hematological: Negative.  Does not bruise/bleed easily.  Psychiatric/Behavioral: Negative.  Negative for dysphoric mood. The patient is not nervous/anxious.       Objective:   Physical Exam  Vitals:  Vitals:   07/25/16 1622  BP: (!) 158/92  Pulse: 77  SpO2: 97%  Weight: 238 lb (108 kg)  Height: 5\' 8"  (1.727 m)   Neck circ : 15 inches.   Constitutional/General:  Pleasant, well-nourished, well-developed, not in any distress,  Comfortably seating.  Well kempt  Body mass index is 36.19 kg/m. Wt Readings from Last 3 Encounters:  07/25/16 238 lb (108 kg)  03/31/16 239 lb (108.4 kg)  02/07/16 224 lb (101.6 kg)       HEENT: Pupils equal and reactive to light and accommodation. Anicteric sclerae. Normal nasal mucosa.   No oral  lesions,  mouth clear,  oropharynx clear, no postnasal drip. (-) Oral thrush. No dental caries.  Airway - Mallampati class III  Neck: No masses. Midline trachea. No JVD, (-) LAD. (-) bruits appreciated.  Respiratory/Chest: Grossly normal chest. (-) deformity. (-) Accessory muscle use.  Symmetric expansion. (-) Tenderness on palpation.  Resonant on percussion.  Diminished BS on both lower lung zones. (-) wheezing, crackles, rhonchi (-) egophony  Cardiovascular: Regular rate and  rhythm, heart sounds normal, no murmur or gallops, no peripheral edema  Gastrointestinal:  Normal bowel sounds. Soft, non-tender. No hepatosplenomegaly.  (-) masses.   Musculoskeletal:  Normal muscle tone. Normal gait.  Extremities: Grossly normal. (-) clubbing, cyanosis.  (-) edema  Skin: (-) rash,lesions seen.   Neurological/Psychiatric : alert, oriented to time, place, person. Normal mood and affect            Assessment & Plan:  OSA (obstructive sleep apnea) Pt has OSA. Was dxed with moderate-severe OSA. Very symptomatic.  Had UPPP surgery in 2011.  Pt has a cpap  machine, got in 2005. Had a rpt sleep study in 2013 which showed AHI 43.  Machine is not functioning well. Sometimes it does not deliver enough pressure. Humidifier is not working.  No supplies recently.  Has hypersomnia recently. Hypersomnia affects fxnality.  We started her on autocpap 5-20 cm water in May 2017. Feels better using it. More energy. Less hypersomnia. DL  last month: 40%, AHI 2.7. She feels pressure might be too much.  Plan:  We extensively discussed the importance of treating OSA and the need to use PAP therapy.   Continue with autocpap > will change settings to 5-16 cm water from 5-20 cm water. Patient has an app on her phone at monitor her compliance. Needs compliance in 1 month. Needs good compliance secondary to A. Fib/ SVT and she's not on [place].   Patient was instructed to have mask, tubings, filter, reservoir cleaned at least once a week with soapy water.  Patient was instructed to call the office if he/she is having issues with the PAP device.    I advised patient to obtain sufficient amount of sleep --  7 to 8 hours at least in a 24 hr period.  Patient was advised to follow good sleep hygiene.  Patient was advised NOT to engage in activities requiring concentration and/or vigilance if he/she is and  sleepy.  Patient is NOT to drive if he/she is sleepy.    Obesity Weight reduction.    Return to clinic in 1 yr. Sooner if with issues.        Monica Becton, MD 07/25/2016   5:07 PM Pulmonary and [place] Pager: [phone] Office: [phone], Fax: [phone]

## 2016-08-01 ENCOUNTER — Other Ambulatory Visit: Payer: Self-pay | Admitting: Orthopaedic Surgery

## 2016-08-14 ENCOUNTER — Encounter: Payer: Self-pay | Admitting: Pulmonary Disease

## 2016-08-14 ENCOUNTER — Telehealth: Payer: Self-pay | Admitting: Family Medicine

## 2016-08-14 MED ORDER — DILTIAZEM HCL ER COATED BEADS 180 MG PO CP24: 180.0000 mg | ORAL_CAPSULE | Freq: Every day | ORAL | 0 refills | Status: DC

## 2016-08-14 MED FILL — CARTIA XT 180 MG CAPSULE SA: 180 | 90 days supply | Qty: 90 | Fill #0

## 2016-08-14 NOTE — Telephone Encounter (Signed)
Relation to [id number] Call back number:[phone] Pharmacy: [place], [place] - [address]. [phone] (Phone) [phone] (Fax)      Reason for call:  \Patient requesting a refill diltiazem (CARTIA XT) 180 MG 24 hr capsule

## 2016-08-15 ENCOUNTER — Encounter: Payer: Self-pay | Admitting: Cardiology

## 2016-08-16 ENCOUNTER — Ambulatory Visit: Payer: 59 | Admitting: Physician Assistant

## 2016-08-16 NOTE — H&P (Signed)
Misty Mahoney is an 50 y.o. female.   Chief Complaint: Left knee pain HPI: Misty Mahoney is a 50 year old woman who works as a Marine scientist in the Designer, fashion/clothing OR at [employer]. She is here for another opinion about her left knee. She has been seeing the doctors at [facility] for some knee pain which has been going on for about a year. The pain came on when she was training for a 5K run. She has persisted with medial joint line pain despite medications and two injections.   Imaging/Tests: We took 4 views of the left knee here today. She has at most just mild medial joint space narrowing. I also reviewed an MRI scan films and report done at [facility] on 03/17/16. This shows a posterior horn medial meniscus tear with a displaced fragment. They read mild to moderate degenerative change as well.  Past Medical History:  Diagnosis Date  . Anemia    gestational  . Atrial fibrillation ([place])   . Chicken pox as a child  . Depression 06/15/2015  . DM, gestational, diet controlled 04/10/2014  . H/O gestational diabetes mellitus, not currently pregnant 04/10/2014  . History of chicken pox   . Hyperlipidemia 12/20/2015  . Hypertension   . Obesity, unspecified 04/10/2014  . OSA (obstructive sleep apnea)   . Preventative health care 12/26/2015  . SVT (supraventricular tachycardia) ([place])    s/p RFCA 09/26/12  . Vitamin D deficiency 12/26/2015    Past Surgical History:  Procedure Laterality Date  . ADENOIDECTOMY    . CARDIAC ELECTROPHYSIOLOGY STUDY AND ABLATION  2013  . ENDOVENOUS ABLATION SAPHENOUS VEIN W/ LASER Bilateral    Thermal ablation  . PALATE / UVULA BIOPSY / EXCISION    . TONSILLECTOMY  50 yrs old  . V-TACH ABLATION N/A 09/26/2012   Procedure: V-TACH ABLATION;  Surgeon: Evans Lance, MD;  Location[facility] CATH LAB;  Service: Cardiovascular;  Laterality: N/A;  . WISDOM TOOTH EXTRACTION  50 yrs old    Family History  Problem Relation Age of Onset  . Hypertension Mother   . Dementia Mother   .  Dementia Father   . Hypertension Father   . Cataracts Father     bilateral  . Alzheimer's disease Father   . Cancer Sister 95    metastatic  . Hypertension Brother   . ADD / ADHD Daughter   . ADD / ADHD Son   . Heart attack Maternal Grandfather     pacemaker  . Cancer Maternal Grandfather     ?  Marland Kitchen Cancer Paternal Grandmother     colon   Social History:  reports that she has never smoked. She has never used smokeless tobacco. She reports that she drinks alcohol. She reports that she does not use drugs.  Allergies: No Known Allergies  No prescriptions prior to admission.    No results found for this or any previous visit (from the past 48 hour(s)). No results found.  Review of Systems  Musculoskeletal: Positive for joint pain.       Left knee  All other systems reviewed and are negative.   There were no vitals taken for this visit. Physical Exam  Constitutional: She is oriented to person, place, and time. She appears well-developed and well-nourished.  HENT:  Head: Normocephalic and atraumatic.  Eyes: Pupils are equal, round, and reactive to light.  Neck: Normal range of motion.  Cardiovascular: Normal rate and regular rhythm.   Respiratory: Effort normal.  GI: Soft.  Musculoskeletal:  Left knee has trace effusion. Her motion is from 0-120 at which point she has pain. She has intense medial joint line pain and a McMurray's test which is positive for pain and a pop in the medial direction. Ligaments feel stable.  Hip motion is full and pain free and SLR is negative on both sides.  There is no palpable LAD behind either knee.    Neurological: She is alert and oriented to person, place, and time.  Skin: Skin is warm and dry.  Psychiatric: She has a normal mood and affect. Her behavior is normal. Judgment and thought content normal.     Assessment/Plan Assessment:  Left knee torn meniscus by MRI injected x2  Plan: I think we can help Misty Mahoney with an arthroscopy. She has  pain which limits her ability to rest and remain active. She's been symptomatic for a year despite pills and 2 injections. I stressed this will make her better but not new as she does have some underlying degenerative change and a family history of arthritis. 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   Zyniah Ferraiolo, Larwance Sachs, PA-C 08/16/2016, 11:50 AM

## 2016-08-17 ENCOUNTER — Encounter: Payer: Self-pay | Admitting: Cardiology

## 2016-08-17 ENCOUNTER — Ambulatory Visit (INDEPENDENT_AMBULATORY_CARE_PROVIDER_SITE_OTHER): Payer: 59 | Admitting: Cardiology

## 2016-08-17 VITALS — BP 136/90 | HR 65 | Ht 68.0 in | Wt 241.4 lb

## 2016-08-17 DIAGNOSIS — I10 Essential (primary) hypertension: Secondary | ICD-10-CM | POA: Diagnosis not present

## 2016-08-17 DIAGNOSIS — Z01818 Encounter for other preprocedural examination: Secondary | ICD-10-CM | POA: Diagnosis not present

## 2016-08-17 DIAGNOSIS — I480 Paroxysmal atrial fibrillation: Secondary | ICD-10-CM | POA: Diagnosis not present

## 2016-08-17 NOTE — Patient Instructions (Signed)
Your physician recommends that you continue on your current medications as directed. Please refer to the Current Medication list given to you today.   Your physician wants you to follow-up in: 6 MONTHS WITH DR KLEIN You will receive a reminder letter in the mail two months in advance. If you don't receive a letter, please call our office to schedule the follow-up appointment.  

## 2016-08-17 NOTE — Progress Notes (Signed)
Cardiology Office Note   Date:  08/17/2016   ID:  Misty Mahoney, DOB 01-24-1966, MRN [id number]  PCP:  Penni Homans, MD  Cardiologist:  Dr. Caryl Comes     Chief Complaint  Patient presents with  . Pre-op Exam    for arthroplasty       History of Present Illness: Misty Mahoney is a 50 y.o. female who presents for pre-op exam for Lt knee arthroplasty.     She has a hx of paroxysmal atrial fibrillation identified a couple of years ago. This emerged following catheter ablation of AVNOdal  Reentry (SVT ablation).    CHADS-VASc score was 1 with a CHADS-VASc score of 2.  At our last visit she declined anticoagulation.  She has had episodes a fib in March, June and Sept.  She takes her flecainide and converts to SR.  She has been under emotional strain and believes this led to recent episode. Once she goes into a fib with RVR she waits 2-3 hours then takes her flecainide and this converts her to SR.Marland Kitchen   She denies any chest pain or SOB with activity.  She is an Secretary/administrator with residency in Neuro OR.   She has had some exhaustion but feels this is due to the residency and illness in her family.      Past Medical History:  Diagnosis Date  . Anemia    gestational  . Atrial fibrillation ([place])   . Chicken pox as a child  . Depression 06/15/2015  . DM, gestational, diet controlled 04/10/2014  . H/O gestational diabetes mellitus, not currently pregnant 04/10/2014  . History of chicken pox   . Hyperlipidemia 12/20/2015  . Hypertension   . Obesity, unspecified 04/10/2014  . OSA (obstructive sleep apnea)   . Preventative health care 12/26/2015  . SVT (supraventricular tachycardia) ([place])    s/p RFCA 09/26/12  . Vitamin D deficiency 12/26/2015    Past Surgical History:  Procedure Laterality Date  . ADENOIDECTOMY    . CARDIAC ELECTROPHYSIOLOGY STUDY AND ABLATION  2013  . ENDOVENOUS ABLATION SAPHENOUS VEIN W/ LASER Bilateral    Thermal ablation  . PALATE / UVULA BIOPSY / EXCISION    .  TONSILLECTOMY  50 yrs old  . V-TACH ABLATION N/A 09/26/2012   Procedure: V-TACH ABLATION;  Surgeon: Evans Lance, MD;  Location: [facility] CATH LAB;  Service: Cardiovascular;  Laterality: N/A;  . WISDOM TOOTH EXTRACTION  50 yrs old     Current Outpatient Prescriptions  Medication Sig Dispense Refill  . acetaminophen (TYLENOL) 325 MG tablet Take 650 mg by mouth every 6 (six) hours as needed. For pain    . Cholecalciferol (VITAMIN D) 2000 UNITS CAPS Take 1 capsule by mouth daily.     Marland Kitchen diltiazem (CARTIA XT) 180 MG 24 hr capsule Take 1 capsule (180 mg total) by mouth daily. 90 capsule 0  . flecainide (TAMBOCOR) 150 MG tablet Take two tablets (300 mg) by mouth x 1 dose for breakthrough of a-fib- do not take more than 1 dose (300 mg) in a 24 hour period 8 tablet 1  . ibuprofen (ADVIL,MOTRIN) 200 MG tablet Take 200 mg by mouth every 6 (six) hours as needed. For pain    . Omega-3 Fatty Acids (FISH OIL PO) Take by mouth daily. Reported on 12/17/2015    . valsartan-hydrochlorothiazide (DIOVAN-HCT) 320-12.5 MG per tablet Take 1 tablet by mouth daily. 90 tablet PRN  . Vitamin D, Ergocalciferol, (DRISDOL) 50000 units CAPS capsule Take  1 capsule (50,000 Units total) by mouth every 7 (seven) days. 12 capsule 1   No current facility-administered medications for this visit.     Allergies:   Review of patient's allergies indicates no known allergies.    Social History:  The patient  reports that she has never smoked. She has never used smokeless tobacco. She reports that she drinks alcohol. She reports that she does not use drugs.   Family History:  The patient's family history includes ADD / ADHD in her daughter and son; Alzheimer's disease in her father; Cancer in her maternal grandfather and paternal grandmother; Cancer (age of onset: 27) in her sister; Cataracts in her father; Dementia in her father and mother; Heart attack in her maternal grandfather; Hypertension in her brother, father, and mother.     ROS:  General:no colds or fevers, no weight changes Skin:no rashes or ulcers HEENT:no blurred vision, no congestion CV:see HPI PUL:see HPI GI:no diarrhea constipation or melena, no indigestion GU:no hematuria, no dysuria MS:no joint pain, no claudication Neuro:no syncope, no lightheadedness Endo:no diabetes, no thyroid disease  Wt Readings from Last 3 Encounters:  08/17/16 241 lb 6.4 oz (109.5 kg)  07/25/16 238 lb (108 kg)  03/31/16 239 lb (108.4 kg)     PHYSICAL EXAM: VS:  BP 136/90   Pulse 65   Ht 5\' 8"  (1.727 m)   Wt 241 lb 6.4 oz (109.5 kg)   BMI 36.70 kg/m  , BMI Body mass index is 36.7 kg/m. General:Pleasant affect, NAD Skin:Warm and dry, brisk capillary refill HEENT:normocephalic, sclera clear, mucus membranes moist Neck:supple, no JVD, no bruits  Heart:S1S2 RRR without murmur, gallup, rub or click Lungs:clear without rales, rhonchi, or wheezes [id number], non tender, + BS, do not palpate liver spleen or masses Ext:no lower ext edema, 2+ pedal pulses, 2+ radial pulses Neuro:alert and oriented X 3, MAE, follows commands, + facial symmetry    EKG:  EKG is ordered today. The ekg ordered today demonstrates SR at 65 normal EKG and no changes from 08/09/15   Recent Labs: 12/20/2015: ALT 14; TSH 1.83 02/07/2016: BUN 21; Creatinine, Ser 0.73; Hemoglobin 12.1; Platelets 338; Potassium 4.1; Sodium 142    Lipid Panel    Component Value Date/Time   CHOL 146 12/20/2015 0943   TRIG 90.0 12/20/2015 0943   HDL 54.10 12/20/2015 0943   CHOLHDL 3 12/20/2015 0943   VLDL 18.0 12/20/2015 0943   LDLCALC 74 12/20/2015 0943       Other studies Reviewed: Additional studies/ records that were reviewed today include:   Echo 08/23/15 . Study Conclusions  - Left ventricle: The cavity size was mildly dilated. Systolic   function was vigorous. The estimated ejection fraction was in the   range of 65% to 70%. Wall motion was normal; there were no   regional wall motion  abnormalities. Left ventricular diastolic   function parameters were normal. - Aortic valve: Trileaflet; normal thickness leaflets. There was no   regurgitation. - Aortic root: The aortic root was normal in size. - Mitral valve: There was mild regurgitation. - Left atrium: The atrium was mildly dilated. - Right ventricle: Systolic function was normal. - Tricuspid valve: There was trivial regurgitation. - Pulmonic valve: There was no regurgitation. - Pulmonary arteries: Systolic pressure was within the normal   range. PA peak pressure: 35 mm Hg (S). - Inferior vena cava: The vessel was normal in size. - Pericardium, extracardiac: There was no pericardial effusion.   ASSESSMENT AND PLAN:  1.  Pre-op exam for Lt knee arthroscopy, low risk for low risk procedure.  2. P. atrial fibrillation has had 3 episodes this year. Will make an appt in 6 months with Dr. Caryl Comes to check on episodes.    3. Hypertension mildly elevated today, she monitors at home and is usually lower.    The patient has paroxysmal atrial fibrillation. We again discussed the role of anticoagulation. I reviewed with her some data from the [place] health care registry highlighting the importance of age and and the strong negative risks associated with younger age. Hence, not withstanding guidelines, I think it is a reasonable decision at this point for her to forego anticoagulation.25    Current medicines are reviewed with the patient today.  The patient Has no concerns regarding medicines.  The following changes have been made:  See above Labs/ tests ordered today include:see above  Disposition:   FU:  see above  Signed, Cecilie Kicks, NP  08/17/2016 4:06 PM    [address], [address] [place], [place] Phone: [phone]; Fax: [phone]

## 2016-08-18 ENCOUNTER — Encounter (HOSPITAL_BASED_OUTPATIENT_CLINIC_OR_DEPARTMENT_OTHER): Payer: Self-pay | Admitting: *Deleted

## 2016-08-18 ENCOUNTER — Encounter: Payer: Self-pay | Admitting: Internal Medicine

## 2016-08-21 ENCOUNTER — Encounter (HOSPITAL_BASED_OUTPATIENT_CLINIC_OR_DEPARTMENT_OTHER)
Admission: RE | Admit: 2016-08-21 | Discharge: 2016-08-21 | Disposition: A | Discharge status: Home / Self Care | Payer: 59 | Source: Ambulatory Visit | Attending: Orthopaedic Surgery | Admitting: Orthopaedic Surgery

## 2016-08-21 DIAGNOSIS — E785 Hyperlipidemia, unspecified: Secondary | ICD-10-CM | POA: Diagnosis not present

## 2016-08-21 DIAGNOSIS — Z8261 Family history of arthritis: Secondary | ICD-10-CM | POA: Diagnosis not present

## 2016-08-21 DIAGNOSIS — E669 Obesity, unspecified: Secondary | ICD-10-CM | POA: Diagnosis not present

## 2016-08-21 DIAGNOSIS — I10 Essential (primary) hypertension: Secondary | ICD-10-CM | POA: Diagnosis not present

## 2016-08-21 DIAGNOSIS — Z8249 Family history of ischemic heart disease and other diseases of the circulatory system: Secondary | ICD-10-CM | POA: Diagnosis not present

## 2016-08-21 DIAGNOSIS — Z800 Family history of malignant neoplasm of digestive organs: Secondary | ICD-10-CM | POA: Diagnosis not present

## 2016-08-21 DIAGNOSIS — M94262 Chondromalacia, left knee: Secondary | ICD-10-CM | POA: Diagnosis not present

## 2016-08-21 DIAGNOSIS — Z6835 Body mass index (BMI) 35.0-35.9, adult: Secondary | ICD-10-CM | POA: Diagnosis not present

## 2016-08-21 DIAGNOSIS — I4891 Unspecified atrial fibrillation: Secondary | ICD-10-CM | POA: Diagnosis not present

## 2016-08-21 DIAGNOSIS — E559 Vitamin D deficiency, unspecified: Secondary | ICD-10-CM | POA: Diagnosis not present

## 2016-08-21 DIAGNOSIS — F329 Major depressive disorder, single episode, unspecified: Secondary | ICD-10-CM | POA: Diagnosis not present

## 2016-08-21 DIAGNOSIS — G4733 Obstructive sleep apnea (adult) (pediatric): Secondary | ICD-10-CM | POA: Diagnosis not present

## 2016-08-21 DIAGNOSIS — M23222 Derangement of posterior horn of medial meniscus due to old tear or injury, left knee: Secondary | ICD-10-CM | POA: Diagnosis not present

## 2016-08-21 LAB — BASIC METABOLIC PANEL
Anion gap: 12 (ref 5–15)
BUN: 25 mg/dL — AB (ref 6–20)
CHLORIDE: 101 mmol/L (ref 101–111)
CO2: 25 mmol/L (ref 22–32)
CREATININE: 0.74 mg/dL (ref 0.44–1.00)
Calcium: 9.7 mg/dL (ref 8.9–10.3)
GFR calc Af Amer: >60 mL/min (ref >60)
GFR calc non Af Amer: >60 mL/min (ref >60)
Glucose, Bld: 90 mg/dL (ref 65–99)
POTASSIUM: 3.5 mmol/L (ref 3.5–5.1)
Sodium: 138 mmol/L (ref 135–145)

## 2016-08-24 ENCOUNTER — Telehealth: Payer: Self-pay | Admitting: Pulmonary Disease

## 2016-08-24 DIAGNOSIS — G4733 Obstructive sleep apnea (adult) (pediatric): Secondary | ICD-10-CM | POA: Diagnosis not present

## 2016-08-24 NOTE — Telephone Encounter (Signed)
   DL the last month (08/2016) :  50%, AHI 3.2. Needs to use cpap more.  Cont autocpap 5-20 cm water.   Monica Becton, MD 08/24/2016, 12:21 PM [facility] Pager [phone] After 3 pm or if no answer, call [phone]

## 2016-08-25 ENCOUNTER — Ambulatory Visit (HOSPITAL_BASED_OUTPATIENT_CLINIC_OR_DEPARTMENT_OTHER): Payer: 59 | Admitting: Certified Registered"

## 2016-08-25 ENCOUNTER — Encounter (HOSPITAL_BASED_OUTPATIENT_CLINIC_OR_DEPARTMENT_OTHER)
Admission: RE | Disposition: A | Discharge status: Home / Self Care | Payer: Self-pay | Source: Ambulatory Visit | Attending: Orthopaedic Surgery

## 2016-08-25 ENCOUNTER — Ambulatory Visit (HOSPITAL_BASED_OUTPATIENT_CLINIC_OR_DEPARTMENT_OTHER)
Admission: RE | Admit: 2016-08-25 | Discharge: 2016-08-25 | Disposition: A | Discharge status: Home / Self Care | Payer: 59 | Source: Ambulatory Visit | Attending: Orthopaedic Surgery | Admitting: Orthopaedic Surgery

## 2016-08-25 ENCOUNTER — Encounter (HOSPITAL_BASED_OUTPATIENT_CLINIC_OR_DEPARTMENT_OTHER): Payer: Self-pay

## 2016-08-25 DIAGNOSIS — Z8249 Family history of ischemic heart disease and other diseases of the circulatory system: Secondary | ICD-10-CM | POA: Insufficient documentation

## 2016-08-25 DIAGNOSIS — E669 Obesity, unspecified: Secondary | ICD-10-CM | POA: Insufficient documentation

## 2016-08-25 DIAGNOSIS — M23222 Derangement of posterior horn of medial meniscus due to old tear or injury, left knee: Secondary | ICD-10-CM | POA: Diagnosis not present

## 2016-08-25 DIAGNOSIS — F329 Major depressive disorder, single episode, unspecified: Secondary | ICD-10-CM | POA: Diagnosis not present

## 2016-08-25 DIAGNOSIS — E785 Hyperlipidemia, unspecified: Secondary | ICD-10-CM | POA: Diagnosis not present

## 2016-08-25 DIAGNOSIS — G4730 Sleep apnea, unspecified: Secondary | ICD-10-CM | POA: Diagnosis not present

## 2016-08-25 DIAGNOSIS — M94262 Chondromalacia, left knee: Secondary | ICD-10-CM | POA: Diagnosis not present

## 2016-08-25 DIAGNOSIS — Z8261 Family history of arthritis: Secondary | ICD-10-CM | POA: Insufficient documentation

## 2016-08-25 DIAGNOSIS — I4891 Unspecified atrial fibrillation: Secondary | ICD-10-CM | POA: Insufficient documentation

## 2016-08-25 DIAGNOSIS — G4733 Obstructive sleep apnea (adult) (pediatric): Secondary | ICD-10-CM | POA: Insufficient documentation

## 2016-08-25 DIAGNOSIS — I10 Essential (primary) hypertension: Secondary | ICD-10-CM | POA: Insufficient documentation

## 2016-08-25 DIAGNOSIS — Z6835 Body mass index (BMI) 35.0-35.9, adult: Secondary | ICD-10-CM | POA: Diagnosis not present

## 2016-08-25 DIAGNOSIS — Z800 Family history of malignant neoplasm of digestive organs: Secondary | ICD-10-CM | POA: Insufficient documentation

## 2016-08-25 DIAGNOSIS — E559 Vitamin D deficiency, unspecified: Secondary | ICD-10-CM | POA: Insufficient documentation

## 2016-08-25 DIAGNOSIS — M23304 Other meniscus derangements, unspecified medial meniscus, left knee: Secondary | ICD-10-CM | POA: Diagnosis not present

## 2016-08-25 DIAGNOSIS — M23322 Other meniscus derangements, posterior horn of medial meniscus, left knee: Secondary | ICD-10-CM | POA: Diagnosis not present

## 2016-08-25 HISTORY — PX: KNEE ARTHROSCOPY WITH MENISCAL REPAIR: SHX5653

## 2016-08-25 SURGERY — ARTHROSCOPY, KNEE, WITH MENISCUS REPAIR
Anesthesia: General | Site: Knee | Laterality: Left

## 2016-08-25 MED ORDER — METHYLPREDNISOLONE ACETATE 40 MG/ML IJ SUSP
INTRAMUSCULAR | Status: AC
  Filled 2016-08-25: qty 1

## 2016-08-25 MED ORDER — DEXAMETHASONE SODIUM PHOSPHATE 4 MG/ML IJ SOLN
INTRAMUSCULAR | Status: DC | PRN
  Administered 2016-08-25: 10 mg via INTRAVENOUS

## 2016-08-25 MED ORDER — FENTANYL CITRATE (PF) 100 MCG/2ML IJ SOLN
50.0000 ug | INTRAMUSCULAR | Status: AC | PRN
  Administered 2016-08-25: 25 ug via INTRAVENOUS
  Administered 2016-08-25: 50 ug via INTRAVENOUS
  Administered 2016-08-25: 25 ug via INTRAVENOUS
  Administered 2016-08-25: 50 ug via INTRAVENOUS

## 2016-08-25 MED ORDER — DEXAMETHASONE SODIUM PHOSPHATE 10 MG/ML IJ SOLN
INTRAMUSCULAR | Status: AC
  Filled 2016-08-25: qty 1

## 2016-08-25 MED ORDER — LIDOCAINE 2% (20 MG/ML) 5 ML SYRINGE
INTRAMUSCULAR | Status: DC | PRN
  Administered 2016-08-25: 60 mg via INTRAVENOUS

## 2016-08-25 MED ORDER — FENTANYL CITRATE (PF) 100 MCG/2ML IJ SOLN
INTRAMUSCULAR | Status: AC
  Filled 2016-08-25: qty 2

## 2016-08-25 MED ORDER — LACTATED RINGERS IV SOLN
INTRAVENOUS | Status: DC
  Administered 2016-08-25 (×2): via INTRAVENOUS

## 2016-08-25 MED ORDER — CEFAZOLIN SODIUM-DEXTROSE 2-4 GM/100ML-% IV SOLN
INTRAVENOUS | Status: AC
  Filled 2016-08-25: qty 100

## 2016-08-25 MED ORDER — KETOROLAC TROMETHAMINE 30 MG/ML IJ SOLN
INTRAMUSCULAR | Status: AC
  Filled 2016-08-25: qty 1

## 2016-08-25 MED ORDER — LIDOCAINE 2% (20 MG/ML) 5 ML SYRINGE
INTRAMUSCULAR | Status: AC
  Filled 2016-08-25: qty 5

## 2016-08-25 MED ORDER — GLYCOPYRROLATE 0.2 MG/ML IJ SOLN: 0.2000 mg | Freq: Once | INTRAMUSCULAR | Status: DC | PRN

## 2016-08-25 MED ORDER — ONDANSETRON HCL 4 MG/2ML IJ SOLN
INTRAMUSCULAR | Status: AC
  Filled 2016-08-25: qty 2

## 2016-08-25 MED ORDER — HYDROCODONE-ACETAMINOPHEN 5-325 MG PO TABS: 1.0000 | ORAL_TABLET | Freq: Four times a day (QID) | ORAL | 0 refills | Status: DC | PRN

## 2016-08-25 MED ORDER — MIDAZOLAM HCL 2 MG/2ML IJ SOLN
INTRAMUSCULAR | Status: AC
  Filled 2016-08-25: qty 2

## 2016-08-25 MED ORDER — KETOROLAC TROMETHAMINE 30 MG/ML IJ SOLN
30.0000 mg | Freq: Once | INTRAMUSCULAR | Status: AC | PRN
  Administered 2016-08-25: 30 mg via INTRAVENOUS

## 2016-08-25 MED ORDER — PROPOFOL 10 MG/ML IV BOLUS
INTRAVENOUS | Status: DC | PRN
  Administered 2016-08-25: 240 mg via INTRAVENOUS

## 2016-08-25 MED ORDER — METHYLPREDNISOLONE ACETATE 80 MG/ML IJ SUSP
INTRAMUSCULAR | Status: DC | PRN
  Administered 2016-08-25: 80 mg

## 2016-08-25 MED ORDER — CHLORHEXIDINE GLUCONATE 4 % EX LIQD: 60.0000 mL | Freq: Once | CUTANEOUS | Status: DC

## 2016-08-25 MED ORDER — ONDANSETRON HCL 4 MG/2ML IJ SOLN
INTRAMUSCULAR | Status: DC | PRN
  Administered 2016-08-25: 4 mg via INTRAVENOUS

## 2016-08-25 MED ORDER — LACTATED RINGERS IV SOLN: INTRAVENOUS | Status: DC

## 2016-08-25 MED ORDER — PROMETHAZINE HCL 25 MG/ML IJ SOLN: 6.2500 mg | INTRAMUSCULAR | Status: DC | PRN

## 2016-08-25 MED ORDER — MIDAZOLAM HCL 2 MG/2ML IJ SOLN
1.0000 mg | INTRAMUSCULAR | Status: DC | PRN
  Administered 2016-08-25: 2 mg via INTRAVENOUS

## 2016-08-25 MED ORDER — FENTANYL CITRATE (PF) 100 MCG/2ML IJ SOLN
25.0000 ug | INTRAMUSCULAR | Status: DC | PRN
  Administered 2016-08-25: 50 ug via INTRAVENOUS
  Administered 2016-08-25: 25 ug via INTRAVENOUS

## 2016-08-25 MED ORDER — OXYCODONE HCL 5 MG PO TABS: 5.0000 mg | ORAL_TABLET | Freq: Once | ORAL | Status: DC | PRN

## 2016-08-25 MED ORDER — BUPIVACAINE HCL (PF) 0.5 % IJ SOLN
INTRAMUSCULAR | Status: AC
  Filled 2016-08-25: qty 30

## 2016-08-25 MED ORDER — OXYCODONE HCL 5 MG/5ML PO SOLN: 5.0000 mg | Freq: Once | ORAL | Status: DC | PRN

## 2016-08-25 MED ORDER — SODIUM CHLORIDE 0.9 % IR SOLN
Status: DC | PRN
  Administered 2016-08-25 (×2): 3000 mL

## 2016-08-25 MED ORDER — BUPIVACAINE HCL 0.5 % IJ SOLN
INTRAMUSCULAR | Status: DC | PRN
Start: 2016-08-25 — End: 2016-08-25
  Administered 2016-08-25: 30 mL

## 2016-08-25 MED ORDER — SCOPOLAMINE 1 MG/3DAYS TD PT72: 1.0000 | MEDICATED_PATCH | Freq: Once | TRANSDERMAL | Status: DC | PRN

## 2016-08-25 MED ORDER — CEFAZOLIN SODIUM-DEXTROSE 2-4 GM/100ML-% IV SOLN
2.0000 g | INTRAVENOUS | Status: AC
  Administered 2016-08-25: 2 g via INTRAVENOUS

## 2016-08-25 MED ORDER — METHYLPREDNISOLONE ACETATE 80 MG/ML IJ SUSP
INTRAMUSCULAR | Status: AC
  Filled 2016-08-25: qty 1

## 2016-08-25 MED FILL — HYDROCODON-APAP 5-325: 5-325 | 8 days supply | Qty: 30 | Fill #0

## 2016-08-25 SURGICAL SUPPLY — 40 items
BANDAGE ACE 6X5 VEL STRL LF (GAUZE/BANDAGES/DRESSINGS) ×3 IMPLANT
BLADE CUDA 5.5 (BLADE) IMPLANT
BLADE CUTTER GATOR 3.5 (BLADE) ×3 IMPLANT
BLADE GREAT WHITE 4.2 (BLADE) IMPLANT
BLADE GREAT WHITE 4.2MM (BLADE)
BNDG GAUZE ELAST 4 BULKY (GAUZE/BANDAGES/DRESSINGS) ×3 IMPLANT
DRAPE ARTHROSCOPY W/POUCH 114 (DRAPES) ×3 IMPLANT
DRAPE U-SHAPE 47X51 STRL (DRAPES) ×3 IMPLANT
DRSG EMULSION OIL 3X3 NADH (GAUZE/BANDAGES/DRESSINGS) ×3 IMPLANT
DURAPREP 26ML APPLICATOR (WOUND CARE) ×3 IMPLANT
ELECT MENISCUS 165MM 90D (ELECTRODE) IMPLANT
ELECT REM PT RETURN 9FT ADLT (ELECTROSURGICAL)
ELECTRODE REM PT RTRN 9FT ADLT (ELECTROSURGICAL) IMPLANT
GAUZE SPONGE 4X4 12PLY STRL (GAUZE/BANDAGES/DRESSINGS) ×3 IMPLANT
GLOVE BIO SURGEON STRL SZ7.5 (GLOVE) ×2 IMPLANT
GLOVE BIO SURGEON STRL SZ8 (GLOVE) ×8 IMPLANT
GLOVE BIOGEL PI IND STRL 7.0 (GLOVE) IMPLANT
GLOVE BIOGEL PI IND STRL 8 (GLOVE) ×2 IMPLANT
GLOVE BIOGEL PI INDICATOR 7.0 (GLOVE) ×2
GLOVE BIOGEL PI INDICATOR 8 (GLOVE) ×6
GLOVE ECLIPSE 6.5 STRL STRAW (GLOVE) ×2 IMPLANT
GLOVE EXAM NITRILE MD LF STRL (GLOVE) ×2 IMPLANT
GOWN STRL REUS W/ TWL LRG LVL3 (GOWN DISPOSABLE) ×1 IMPLANT
GOWN STRL REUS W/ TWL XL LVL3 (GOWN DISPOSABLE) ×2 IMPLANT
GOWN STRL REUS W/TWL LRG LVL3 (GOWN DISPOSABLE) ×3
GOWN STRL REUS W/TWL XL LVL3 (GOWN DISPOSABLE) ×6
IV NS IRRIG 3000ML ARTHROMATIC (IV SOLUTION) ×4 IMPLANT
KNEE WRAP E Z 3 GEL PACK (MISCELLANEOUS) ×3 IMPLANT
MANIFOLD NEPTUNE II (INSTRUMENTS) IMPLANT
PACK ARTHROSCOPY DSU (CUSTOM PROCEDURE TRAY) ×3 IMPLANT
PACK BASIN DAY SURGERY FS (CUSTOM PROCEDURE TRAY) ×3 IMPLANT
PENCIL BUTTON HOLSTER BLD 10FT (ELECTRODE) IMPLANT
PROBE BIPOLAR ATHRO 135MM 90D (MISCELLANEOUS) IMPLANT
SET ARTHROSCOPY TUBING (MISCELLANEOUS) ×3
SET ARTHROSCOPY TUBING LN (MISCELLANEOUS) ×1 IMPLANT
SHEET MEDIUM DRAPE 40X70 STRL (DRAPES) ×3 IMPLANT
SYR 3ML 18GX1 1/2 (SYRINGE) IMPLANT
TOWEL OR 17X24 6PK STRL BLUE (TOWEL DISPOSABLE) ×3 IMPLANT
TOWEL OR NON WOVEN STRL DISP B (DISPOSABLE) ×3 IMPLANT
WATER STERILE IRR 1000ML POUR (IV SOLUTION) ×3 IMPLANT

## 2016-08-25 NOTE — CV Procedure (Signed)
#  [id number] 

## 2016-08-25 NOTE — Anesthesia Procedure Notes (Signed)
Procedure Name: LMA Insertion Date/Time: 08/25/2016 2:29 PM Performed by: Baxter Flattery Pre-anesthesia Checklist: Patient identified, Emergency Drugs available, Suction available and Patient being monitored Patient Re-evaluated:Patient Re-evaluated prior to inductionOxygen Delivery Method: Circle system utilized Preoxygenation: Pre-oxygenation with 100% oxygen Intubation Type: IV induction Ventilation: Mask ventilation without difficulty LMA: LMA inserted LMA Size: 4.0 Number of attempts: 1 Airway Equipment and Method: Bite block Placement Confirmation: positive ETCO2 and breath sounds checked- equal and bilateral Tube secured with: Tape Dental Injury: Teeth and Oropharynx as per pre-operative assessment

## 2016-08-25 NOTE — Transfer of Care (Signed)
Immediate Anesthesia Transfer of Care Note  Patient: Misty Mahoney  Procedure(s) Performed: Procedure(s) with comments: KNEE ARTHROSCOPY WITH partial medial meniscectomy, chondroplasty (Left) - KNEE ARTHROSCOPY WITH partial medial meniscectomy, chondroplasty  Patient Location: PACU  Anesthesia Type:General  Level of Consciousness: awake, alert , oriented and patient cooperative  Airway & Oxygen Therapy: Patient Spontanous Breathing and Patient connected to face mask oxygen  Post-op Assessment: Report given to RN, Post -op Vital signs reviewed and stable and Patient moving all extremities  Post vital signs: Reviewed and stable  Last Vitals:  Vitals:   08/25/16 1345 08/25/16 1400  BP: (!) 160/78 (!) 151/67  Pulse: 77 71  Resp: 15 20  Temp:      Last Pain:  Vitals:   08/25/16 1312  TempSrc: Oral         Complications: No apparent anesthesia complications

## 2016-08-25 NOTE — Anesthesia Postprocedure Evaluation (Signed)
Anesthesia Post Note  Patient: Misty Mahoney  Procedure(s) Performed: Procedure(s) (LRB): KNEE ARTHROSCOPY WITH partial medial meniscectomy, chondroplasty (Left)  Patient location during evaluation: PACU Anesthesia Type: General Level of consciousness: awake and alert Pain management: pain level controlled Vital Signs Assessment: post-procedure vital signs reviewed and stable Respiratory status: spontaneous breathing, nonlabored ventilation, respiratory function stable and patient connected to nasal cannula oxygen Cardiovascular status: blood pressure returned to baseline and stable Postop Assessment: no signs of nausea or vomiting Anesthetic complications: no    Last Vitals:  Vitals:   08/25/16 1400 08/25/16 1509  BP: (!) 151/67 (!) 154/89  Pulse: 71 90  Resp: 20 18  Temp:  36.4 C    Last Pain:  Vitals:   08/25/16 1312  TempSrc: Oral                 Savreen Gebhardt S

## 2016-08-25 NOTE — Discharge Instructions (Signed)
Arthroscopic Knee Surgery Post -OP Instructions ° °You have just had an arthroscopic operation. Your incisions (puncture sites) are small and should heal quickly. The structures inside your knee may take 6-8 weeks to heal and settle down. This healing time is variable and may range from a few days to 6-8 weeks. ° °Pain Medication: °You will be given a prescription for pain medication. Please take the medication as needed. Most patients require pain medication for only a few days. ° °Swelling: °You can expect some swelling in your knee. Applying ice and elevating your leg will help keep the swelling to a minimum. Ice can be applied by placing ice cubes in a plastic bag and putting the bag on your knee with a towel between the ice bag and your knee. Sometimes you will be issued an ice pack wrap and that will work as well. Your entire leg should be elevated, not just your knee. Elevate your leg to or above the level of your heart. The ice should be used periodically during the first 48 hours along with continued elevation of your leg. The swelling should subside over the next several weeks. ° ° °Post Anesthesia Home Care Instructions ° °Activity: °Get plenty of rest for the remainder of the day. A responsible adult should stay with you for 24 hours following the procedure.  °For the next 24 hours, DO NOT: °-Drive a car °-Operate machinery °-Drink alcoholic beverages °-Take any medication unless instructed by your physician °-Make any legal decisions or sign important papers. ° °Meals: °Start with liquid foods such as gelatin or soup. Progress to regular foods as tolerated. Avoid greasy, spicy, heavy foods. If nausea and/or vomiting occur, drink only clear liquids until the nausea and/or vomiting subsides. Call your physician if vomiting continues. ° °Special Instructions/Symptoms: °Your throat may feel dry or sore from the anesthesia or the breathing tube placed in your throat during surgery. If this causes discomfort,  gargle with warm salt water. The discomfort should disappear within 24 hours. ° °If you had a scopolamine patch placed behind your ear for the management of post- operative nausea and/or vomiting: ° °1. The medication in the patch is effective for 72 hours, after which it should be removed.  Wrap patch in a tissue and discard in the trash. Wash hands thoroughly with soap and water. °2. You may remove the patch earlier than 72 hours if you experience unpleasant side effects which may include dry mouth, dizziness or visual disturbances. °3. Avoid touching the patch. Wash your hands with soap and water after contact with the patch. °  ° °Dressing: °Fluid leakage is common the first night. Precautions to prevent staining of your clothes, bed sheets, etc., should be taken. This fluid was used to inflate the joint during surgery and is commonly tinged red from a small amount of blood. Remove your dressing tomorrow. Some bleeding or leakage from the puncture sites may occur for a few days and you should cover the puncture sites with Band-Aids until the leakage stops.Alternatively you may reapply the ace wrap with some gauze pads over the puncture sites but don't wrap it too tightly.  You may shower 2 days after surgery.  ° °Activity: °You may bend and straighten your knee as soon as it is comfortable to do so. Using your knee will help decrease swelling and help prevent stiffness, as long as you don’t overdo it. Moving your foot up and down also helps decrease the swelling. There is no harm in putting   putting weight on; your leg as long as it is comfortable to do so. (You should not, try to run or jump). Gradually increase activity as you can tolerate. An increase in pain or swelling with certain activities or with increased activities may indicate you are doing too much. Back up and start building up your activities at a slower rate. You may need crutches for a few days.  Office Check-Up: If no major problems arise  and you are progressing well, we will need to see you in the office in one (1) week unless otherwise instructed by your doctor. Please call the office to make an appointment.

## 2016-08-25 NOTE — Interval H&P Note (Signed)
History and Physical Interval Note:  08/25/2016 2:06 PM  Misty Mahoney  has presented today for surgery, with the diagnosis of LEFT KNEE MEDIAL MENISCUS TEAR   The various methods of treatment have been discussed with the patient and family. After consideration of risks, benefits and other options for treatment, the patient has consented to  Procedure(s): KNEE ARTHROSCOPY WITH MENISCAL REPAIR (Left) as a surgical intervention .  The patient's history has been reviewed, patient examined, no change in status, stable for surgery.  I have reviewed the patient's chart and labs.  Questions were answered to the patient's satisfaction.     Alvon Nygaard G

## 2016-08-25 NOTE — Anesthesia Preprocedure Evaluation (Signed)
Anesthesia Evaluation  Patient identified by MRN, date of birth, ID band Patient awake    Reviewed: Allergy & Precautions, NPO status , Patient's Chart, lab work & pertinent test results  Airway Mallampati: II  TM Distance: <3 FB Neck ROM: Full    Dental no notable dental hx.    Pulmonary sleep apnea ,    Pulmonary exam normal breath sounds clear to auscultation       Cardiovascular hypertension, Normal cardiovascular exam Rhythm:Regular Rate:Normal     Neuro/Psych negative neurological ROS  negative psych ROS   GI/Hepatic negative GI ROS, Neg liver ROS,   Endo/Other  diabetesMorbid obesity  Renal/GU negative Renal ROS  negative genitourinary   Musculoskeletal negative musculoskeletal ROS (+)   Abdominal   Peds negative pediatric ROS (+)  Hematology negative hematology ROS (+)   Anesthesia Other Findings   Reproductive/Obstetrics negative OB ROS                             Anesthesia Physical Anesthesia Plan  ASA: III  Anesthesia Plan: General   Post-op Pain Management:    Induction: Intravenous  Airway Management Planned: LMA  Additional Equipment:   Intra-op Plan:   Post-operative Plan: Extubation in OR  Informed Consent: I have reviewed the patients History and Physical, chart, labs and discussed the procedure including the risks, benefits and alternatives for the proposed anesthesia with the patient or authorized representative who has indicated his/her understanding and acceptance.   Dental advisory given  Plan Discussed with: CRNA and Surgeon  Anesthesia Plan Comments:         Anesthesia Quick Evaluation

## 2016-08-28 ENCOUNTER — Encounter (HOSPITAL_BASED_OUTPATIENT_CLINIC_OR_DEPARTMENT_OTHER): Payer: Self-pay | Admitting: Orthopaedic Surgery

## 2016-08-28 NOTE — Op Note (Signed)
NAMEDANNAE, IBBOTSON NO.:  [id number]  MEDICAL RECORD NO.:  [id number]  LOCATION:                                 FACILITY:  PHYSICIAN:  Monico Blitz. Marli Diego, M.D.DATE OF BIRTH:  Mar 23, 1966  DATE OF PROCEDURE:  08/25/2016 DATE OF DISCHARGE:                              OPERATIVE REPORT   PREOPERATIVE DIAGNOSES: 1. Left knee torn medial meniscus. 2. Left knee chondromalacia.  POSTOPERATIVE DIAGNOSES: 1. Left knee torn medial meniscus. 2. Left knee chondromalacia.  PROCEDURES: 1. Left knee partial meniscectomy. 2. Left knee abrasion chondroplasty patellofemoral and medial.  ATTENDING SURGEON:  Monico Blitz. Rhona Raider, M.D.  ANESTHESIA:  General.  INDICATION FOR PROCEDURE:  The patient is a 50 year old woman with a long history of left knee pain and swelling.  This was persisted despite conservative measures.  The MRI scan she has a medial meniscal tear along with some moderate degenerative change.  She has good joint space maintenance on some plain films.  She has pain which limits her ability to rest and walk and work, and she is offered an arthroscopy.  Informed operative consent was obtained after discussion of possible complications including reaction to anesthesia and infection as well as DVT.  SUMMARY OF FINDINGS AND PROCEDURE:  Under general anesthesia, left knee arthroscopy was performed.  Suprapatellar pouch was benign, while the patellofemoral joint exhibited grade 3 and 4 change.  She had large flaps of articular cartilage with some areas of exposed bone.  A thorough chondroplasty was done along with abrasion of bleeding bone of some small areas through the intertrochlear groove.  In the medial compartment, she had mostly grade 3 change with no exposed bone.  A thorough chondroplasty was done along the femur.  She did have a posterior horn medial meniscus tear, which was flipped under itself. This was brought into the knee and a partial medial  meniscectomy was performed taking about 10% of the meniscus back to a stable rim.  The ACL was normal.  The lateral compartment exhibited no evidence of meniscal or articular cartilage injury.  She was discharged home the same day to follow up in the office in less than a week.  DESCRIPTION OF PROCEDURE:  The patient was taken to the operating suite, where general anesthetic was applied without difficulty.  She was positioned supine and prepped draped in normal sterile fashion.  After administration of IV Kefzol, an appropriate time-out and arthroscopy of the left knee was performed through total of 2 portals.  Findings were as noted above.  Procedure consisted predominantly the partial medial meniscectomy, which was done with a basket and shaver.  This was taken back to stable tissues.  Then performed a chondroplasty medial and a chondroplasty plus abrasion to bleeding bone patellofemoral.  The knee was thoroughly irrigated followed by placement of Marcaine without epinephrine along with Depo-Medrol.  Adaptic was placed over the portals followed by dry gauze and loose Ace wrap.  Estimated blood loss and fluids obtained from anesthesia records.  DISPOSITION:  The patient was extubated in the operating room and taken to recovery in stable condition.  Plans were for to go home same-day and follow up with [place]  in the office in less than a week.  I will contact her by phone tonight.     Monico Blitz Rhona Raider, M.D.   ______________________________ Monico Blitz. Rhona Raider, M.D.    PGD/MEDQ  D:  08/25/2016  T:  08/26/2016  Job:  [id number]

## 2016-08-29 ENCOUNTER — Encounter: Payer: Self-pay | Admitting: Family Medicine

## 2016-08-29 ENCOUNTER — Telehealth: Payer: Self-pay | Admitting: Family Medicine

## 2016-08-29 ENCOUNTER — Ambulatory Visit (INDEPENDENT_AMBULATORY_CARE_PROVIDER_SITE_OTHER): Payer: 59 | Admitting: Family Medicine

## 2016-08-29 ENCOUNTER — Encounter: Payer: Self-pay | Admitting: Pulmonary Disease

## 2016-08-29 DIAGNOSIS — R739 Hyperglycemia, unspecified: Secondary | ICD-10-CM | POA: Diagnosis not present

## 2016-08-29 DIAGNOSIS — I10 Essential (primary) hypertension: Secondary | ICD-10-CM | POA: Diagnosis not present

## 2016-08-29 DIAGNOSIS — F339 Major depressive disorder, recurrent, unspecified: Secondary | ICD-10-CM | POA: Diagnosis not present

## 2016-08-29 DIAGNOSIS — E785 Hyperlipidemia, unspecified: Secondary | ICD-10-CM | POA: Diagnosis not present

## 2016-08-29 MED ORDER — VALSARTAN-HYDROCHLOROTHIAZIDE 320-12.5 MG PO TABS: 1.0000 | ORAL_TABLET | Freq: Every day | ORAL | 3 refills | Status: DC

## 2016-08-29 MED ORDER — ESCITALOPRAM OXALATE 10 MG PO TABS: 10.0000 mg | ORAL_TABLET | Freq: Every day | ORAL | 2 refills | Status: DC

## 2016-08-29 MED ORDER — DILTIAZEM HCL ER COATED BEADS 180 MG PO CP24: 180.0000 mg | ORAL_CAPSULE | Freq: Every day | ORAL | 3 refills | Status: DC

## 2016-08-29 NOTE — Progress Notes (Signed)
Pre visit review using our clinic review tool, if applicable. No additional management support is needed unless otherwise documented below in the visit note. 

## 2016-08-29 NOTE — Telephone Encounter (Signed)
Patient called back stating that her sister has passed and she is going to come on in at 74 today

## 2016-08-29 NOTE — Telephone Encounter (Signed)
Of course so I only have one day before I leave for my father's memorial and cannot see her til after then unless she wants this Thursday for a quick visit at 7:30 after that could not do til the week after next. If she watns to wait ask her what week works for her and I will try and find a day I can come in early to see her.

## 2016-08-29 NOTE — Patient Instructions (Signed)
Complicated Grieving Grief is a normal response to the death of someone close to you. Feelings of fear, anger, and guilt can affect almost everyone who loses a loved one. It is also common to have symptoms of depression while you are grieving. These include problems with sleep, loss of appetite, and lack of energy. They may last for weeks or months after a loss. Complicated grief is different from normal grief or depression. Normal grieving involves sadness and feelings of loss, but these feelings are not constant. Complicated grief is a constant and severe type of grief. It interferes with your ability to function normally. It may last for several months to a year or longer. Complicated grief may require treatment from a mental health care provider. CAUSES  It is not known why some people continue to struggle with grief and others do not. You may be at higher risk for complicated grief if:  The death of your loved one was sudden or unexpected.  The death of your loved one was due to a violent event.  Your loved one committed suicide.  Your loved one was a child or a young person.  You were very close to or dependent on the loved one.  You have a history of depression. SIGNS AND SYMPTOMS Signs and symptoms of complicated grief may include:  Feeling disbelief or numbness.  Being unable to enjoy good memories of your loved one.  Needing to avoid anything that reminds you of your loved one.  Being unable to stop thinking about the death.  Feeling intense anger or guilt.  Feeling alone and hopeless.  Feeling that your life is meaningless and empty.  Losing the desire to live. DIAGNOSIS Your health care provider may diagnose complicated grief if:  You have constant symptoms of grief for 6-12 months or longer.  Your symptoms are interfering with your ability to live your life. Your health care provider may want you to see a mental health care provider. Many symptoms of depression  are similar to the symptoms of complicated grief. It is important to be evaluated for complicated grief along with other mental health conditions. TREATMENT  Talk therapy with a mental health provider is the most common treatment for complicated grief. During therapy, you will learn healthy ways to cope with the loss of your loved one. In some cases, your mental health care provider may also recommend antidepressant medicines. HOME CARE INSTRUCTIONS  Take care of yourself.  Eat regular meals and maintain a healthy diet. Eat plenty of fruits, vegetables, and whole grains.  Try to get some exercise each day.  Keep regular hours for sleep. Try to get at least 8 hours of sleep each night.  Do not use drugs or alcohol to ease your symptoms.  Take medicines only as directed by your health care provider.  Spend time with friends and loved ones.  Consider joining a grief (bereavement) support group to help you deal with your loss.  Keep all follow-up visits as directed by your health care provider. This is important. SEEK MEDICAL CARE IF:  Your symptoms keep you from functioning normally.  Your symptoms do not get better with treatment. SEEK IMMEDIATE MEDICAL CARE IF:  You have serious thoughts of hurting yourself or someone else.  You have suicidal feelings.   This information is not intended to replace advice given to you by your health care provider. Make sure you discuss any questions you have with your health care provider.   Document Released: 11/06/2005   Document Revised: 07/28/2015 Document Reviewed: 04/16/2014 Elsevier Interactive Patient Education 2016 [employer] Inc[employer]  

## 2016-08-29 NOTE — Telephone Encounter (Signed)
Patient has appt. Today. 

## 2016-08-29 NOTE — Telephone Encounter (Signed)
Caller name: Relationship to patient: Can be reached: Pharmacy:  Reason for call: Patient called in reference to her 5 PM appointment today with Dr. Charlett Blake. States she really needs to see the doctor but at this time her sister is actively dying and not expected to make it through today. Request to reschedule but no Follow up time available soon. Plse adv. I will call patient back with a date and time that she can be seen.

## 2016-08-30 MED FILL — ESCITALOPRAM 10 MG TABLET: 10 | 30 days supply | Qty: 30 | Fill #0

## 2016-08-30 MED FILL — CEPHALEXIN 500 MG CAPSULE: 500 | 7 days supply | Qty: 28 | Fill #0

## 2016-08-30 MED FILL — VIT D2 1.25 MG (50,000 UNIT: 1.25 MG | 84 days supply | Qty: 12 | Fill #1

## 2016-09-01 DIAGNOSIS — M25562 Pain in left knee: Secondary | ICD-10-CM | POA: Diagnosis not present

## 2016-09-04 DIAGNOSIS — M25562 Pain in left knee: Secondary | ICD-10-CM | POA: Diagnosis not present

## 2016-09-06 ENCOUNTER — Encounter: Payer: Self-pay | Admitting: Family Medicine

## 2016-09-06 NOTE — Assessment & Plan Note (Signed)
Mild elevation but her sister died this morning. Will monitor

## 2016-09-06 NOTE — Assessment & Plan Note (Signed)
minimize simple carbs. Increase exercise as tolerated.  

## 2016-09-06 NOTE — Assessment & Plan Note (Signed)
Encouraged heart healthy diet, increase exercise, avoid trans fats, consider a krill oil cap daily 

## 2016-09-06 NOTE — Progress Notes (Signed)
Patient ID: Misty Mahoney, female   DOB: 07/23/1966, 50 y.o.   MRN: [id number]   Subjective:    Patient ID: Misty Mahoney, female    DOB: 1966-05-10, 50 y.o.   MRN: [id number]  Chief Complaint  Patient presents with  . Follow-up    HPI Patient is in today for follow up. She is very sad secondary to her sister dying this morning. She is aware that she has been struggling with depression during her sister's illness and that it will continue to be bad as she struggles with her grief. She notes anhedonia but denies any suicidal ideation. Denies CP/palp/SOB/HA/congestion/fevers/GI or GU c/o. Taking meds as prescribed  Past Medical History:  Diagnosis Date  . Anemia    gestational  . Atrial fibrillation ([place])   . Chicken pox as a child  . Depression 06/15/2015  . DM, gestational, diet controlled 04/10/2014  . H/O gestational diabetes mellitus, not currently pregnant 04/10/2014  . History of chicken pox   . Hyperlipidemia 12/20/2015  . Hypertension   . Obesity, unspecified 04/10/2014  . OSA (obstructive sleep apnea)   . Preventative health care 12/26/2015  . SVT (supraventricular tachycardia) ([place])    s/p RFCA 09/26/12  . Vitamin D deficiency 12/26/2015    Past Surgical History:  Procedure Laterality Date  . ADENOIDECTOMY    . CARDIAC ELECTROPHYSIOLOGY STUDY AND ABLATION  2013  . ENDOVENOUS ABLATION SAPHENOUS VEIN W/ LASER Bilateral    Thermal ablation  . KNEE ARTHROSCOPY WITH MENISCAL REPAIR Left 08/25/2016   Procedure: KNEE ARTHROSCOPY WITH partial medial meniscectomy, chondroplasty;  Surgeon: Melrose Nakayama, MD;  Location: [facility];  Service: Orthopedics;  Laterality: Left;  KNEE ARTHROSCOPY WITH partial medial meniscectomy, chondroplasty  . PALATE / UVULA BIOPSY / EXCISION    . TONSILLECTOMY  50 yrs old  . V-TACH ABLATION N/A 09/26/2012   Procedure: V-TACH ABLATION;  Surgeon: Evans Lance, MD;  Location: [facility] C[facility] LAB;  Service: Cardiovascular;  Laterality: N/A;  .  WISDOM TOOTH EXTRACTION  50 yrs old    Family History  Problem Relation Age of Onset  . Hypertension Mother   . Dementia Mother   . Dementia Father   . Hypertension Father   . Cataracts Father     bilateral  . Alzheimer's disease Father   . Cancer Sister 76    metastatic  . Hypertension Brother   . ADD / ADHD Daughter   . ADD / ADHD Son   . Heart attack Maternal Grandfather     pacemaker  . Cancer Maternal Grandfather     ?  Marland Kitchen Cancer Paternal Grandmother     colon    Social History   Social History  . Marital status: Legally Separated    Spouse name: N/A  . Number of children: N/A  . Years of education: N/A   Occupational History  . Not on file.   Social History Main Topics  . Smoking status: Never Smoker  . Smokeless tobacco: Never Used  . Alcohol use Yes     Comment: two or three times a year  . Drug use: No  . Sexual activity: Yes    Partners: Male     Comment: lives with kids, significant other and step daughter. minimizing dairy and gluten, exercise   Other Topics Concern  . Not on file   Social History Narrative  . No narrative on file    Outpatient Medications Prior to Visit  Medication Sig Dispense Refill  .  acetaminophen (TYLENOL) 325 MG tablet Take 650 mg by mouth every 6 (six) hours as needed. For pain    . Cholecalciferol (VITAMIN D) 2000 UNITS CAPS Take 1 capsule by mouth daily.     Marland Kitchen diltiazem (CARTIA XT) 180 MG 24 hr capsule Take 1 capsule (180 mg total) by mouth daily. 90 capsule 0  . flecainide (TAMBOCOR) 150 MG tablet Take two tablets (300 mg) by mouth x 1 dose for breakthrough of a-fib- do not take more than 1 dose (300 mg) in a 24 hour period 8 tablet 1  . ibuprofen (ADVIL,MOTRIN) 200 MG tablet Take 200 mg by mouth every 6 (six) hours as needed. For pain    . Omega-3 Fatty Acids (FISH OIL PO) Take by mouth daily. Reported on 12/17/2015    . valsartan-hydrochlorothiazide (DIOVAN-HCT) 320-12.5 MG per tablet Take 1 tablet by mouth daily. 90  tablet PRN  . HYDROcodone-acetaminophen (NORCO) 5-325 MG tablet Take 1 tablet by mouth every 6 (six) hours as needed for moderate pain. (Patient not taking: Reported on 08/29/2016) 30 tablet 0   No facility-administered medications prior to visit.     Allergies  Allergen Reactions  . Sunflower Oil Swelling    Review of Systems  Constitutional: Positive for malaise/fatigue. Negative for fever.  HENT: Negative for congestion.   Eyes: Negative for blurred vision.  Respiratory: Negative for shortness of breath.   Cardiovascular: Negative for chest pain, palpitations and leg swelling.  Gastrointestinal: Negative for abdominal pain, blood in stool and nausea.  Genitourinary: Negative for dysuria and frequency.  Musculoskeletal: Negative for falls.  Skin: Negative for rash.  Neurological: Negative for dizziness, loss of consciousness and headaches.  Endo/Heme/Allergies: Negative for environmental allergies.  Psychiatric/Behavioral: Positive for depression. The patient is nervous/anxious.        Objective:    Physical Exam  Constitutional: She is oriented to person, place, and time. She appears well-developed and well-nourished. No distress.  HENT:  Head: Normocephalic and atraumatic.  Nose: Nose normal.  Eyes: Right eye exhibits no discharge. Left eye exhibits no discharge.  Neck: Normal range of motion. Neck supple.  Cardiovascular: Normal rate and regular rhythm.   No murmur heard. Pulmonary/Chest: Effort normal and breath sounds normal.  Abdominal: Soft. Bowel sounds are normal. There is no tenderness.  Musculoskeletal: She exhibits no edema.  Neurological: She is alert and oriented to person, place, and time.  Skin: Skin is warm and dry.  Psychiatric: She has a normal mood and affect.  Nursing note and vitals reviewed.   BP (!) 146/80 (BP Location: Left Arm, Patient Position: Sitting, Cuff Size: Large)   Pulse 82   Temp 98.4 F (36.9 C) (Oral)   Ht 5\' 8"  (1.727 m)    Wt 236 lb 4 oz (107.2 kg)   LMP 08/18/2016 (Exact Date)   SpO2 97%   BMI 35.92 kg/m  Wt Readings from Last 3 Encounters:  08/29/16 236 lb 4 oz (107.2 kg)  08/25/16 234 lb (106.1 kg)  08/17/16 241 lb 6.4 oz (109.5 kg)     Lab Results  Component Value Date   WBC 9.0 02/07/2016   HGB 12.1 02/07/2016   HCT 37.1 02/07/2016   PLT 338 02/07/2016   GLUCOSE 90 08/21/2016   CHOL 146 12/20/2015   TRIG 90.0 12/20/2015   HDL 54.10 12/20/2015   LDLCALC 74 12/20/2015   ALT 14 12/20/2015   AST 17 12/20/2015   NA 138 08/21/2016   K 3.5 08/21/2016   CL 101  08/21/2016   CREATININE 0.74 08/21/2016   BUN 25 (H) 08/21/2016   CO2 25 08/21/2016   TSH 1.83 12/20/2015   HGBA1C 5.7 12/20/2015    Lab Results  Component Value Date   TSH 1.83 12/20/2015   Lab Results  Component Value Date   WBC 9.0 02/07/2016   HGB 12.1 02/07/2016   HCT 37.1 02/07/2016   MCV 87.7 02/07/2016   PLT 338 02/07/2016   Lab Results  Component Value Date   NA 138 08/21/2016   K 3.5 08/21/2016   CO2 25 08/21/2016   GLUCOSE 90 08/21/2016   BUN 25 (H) 08/21/2016   CREATININE 0.74 08/21/2016   BILITOT 0.3 12/20/2015   ALKPHOS 40 12/20/2015   AST 17 12/20/2015   ALT 14 12/20/2015   PROT 7.4 12/20/2015   ALBUMIN 3.9 12/20/2015   CALCIUM 9.7 08/21/2016   ANIONGAP 12 08/21/2016   GFR 106.09 12/20/2015   Lab Results  Component Value Date   CHOL 146 12/20/2015   Lab Results  Component Value Date   HDL 54.10 12/20/2015   Lab Results  Component Value Date   LDLCALC 74 12/20/2015   Lab Results  Component Value Date   TRIG 90.0 12/20/2015   Lab Results  Component Value Date   CHOLHDL 3 12/20/2015   Lab Results  Component Value Date   HGBA1C 5.7 12/20/2015       Assessment & Plan:   Problem List Items Addressed This Visit    Hypertension    Mild elevation but her sister died this morning. Will monitor      Relevant Medications   diltiazem (CARTIA XT) 180 MG 24 hr capsule    valsartan-hydrochlorothiazide (DIOVAN-HCT) 320-12.5 MG tablet   Depression    Very sad today as her sister died today after a battle with cancer. Will start Escitalopram and referred for counseling.       Relevant Medications   escitalopram (LEXAPRO) 10 MG tablet   Hyperlipidemia    Encouraged heart healthy diet, increase exercise, avoid trans fats, consider a krill oil cap daily      Relevant Medications   diltiazem (CARTIA XT) 180 MG 24 hr capsule   valsartan-hydrochlorothiazide (DIOVAN-HCT) 320-12.5 MG tablet   Hyperglycemia    minimize simple carbs. Increase exercise as tolerated.        Other Visit Diagnoses   None.     I have discontinued Ms. Jarquin's acetaminophen, ibuprofen, Vitamin D, Omega-3 Fatty Acids (FISH OIL PO), flecainide, and HYDROcodone-acetaminophen. I have also changed her valsartan-hydrochlorothiazide. Additionally, I am having her start on escitalopram. Lastly, I am having her maintain her diltiazem.  Meds ordered this encounter  Medications  . diltiazem (CARTIA XT) 180 MG 24 hr capsule    Sig: Take 1 capsule (180 mg total) by mouth daily.    Dispense:  90 capsule    Refill:  3  . valsartan-hydrochlorothiazide (DIOVAN-HCT) 320-12.5 MG tablet    Sig: Take 1 tablet by mouth daily.    Dispense:  90 tablet    Refill:  3  . escitalopram (LEXAPRO) 10 MG tablet    Sig: Take 1 tablet (10 mg total) by mouth daily.    Dispense:  30 tablet    Refill:  2     Penni Homans, MD

## 2016-09-06 NOTE — Assessment & Plan Note (Addendum)
Very sad today as her sister died today after a battle with cancer. Will start Escitalopram and referred for counseling.

## 2016-09-07 DIAGNOSIS — M25562 Pain in left knee: Secondary | ICD-10-CM | POA: Diagnosis not present

## 2016-09-11 DIAGNOSIS — M25562 Pain in left knee: Secondary | ICD-10-CM | POA: Diagnosis not present

## 2016-09-14 DIAGNOSIS — M25562 Pain in left knee: Secondary | ICD-10-CM | POA: Diagnosis not present

## 2016-09-20 DIAGNOSIS — M25562 Pain in left knee: Secondary | ICD-10-CM | POA: Diagnosis not present

## 2016-09-20 MED FILL — VALSARTAN-HCTZ 320-12.5 MG: 320-12.5 | 90 days supply | Qty: 90 | Fill #0

## 2016-09-22 DIAGNOSIS — M25562 Pain in left knee: Secondary | ICD-10-CM | POA: Diagnosis not present

## 2016-09-24 DIAGNOSIS — G4733 Obstructive sleep apnea (adult) (pediatric): Secondary | ICD-10-CM | POA: Diagnosis not present

## 2016-09-26 DIAGNOSIS — M25562 Pain in left knee: Secondary | ICD-10-CM | POA: Diagnosis not present

## 2016-09-27 DIAGNOSIS — G4733 Obstructive sleep apnea (adult) (pediatric): Secondary | ICD-10-CM | POA: Diagnosis not present

## 2016-09-29 DIAGNOSIS — M25562 Pain in left knee: Secondary | ICD-10-CM | POA: Diagnosis not present

## 2016-10-03 DIAGNOSIS — M25562 Pain in left knee: Secondary | ICD-10-CM | POA: Diagnosis not present

## 2016-10-04 DIAGNOSIS — M25562 Pain in left knee: Secondary | ICD-10-CM | POA: Diagnosis not present

## 2016-10-06 DIAGNOSIS — M25562 Pain in left knee: Secondary | ICD-10-CM | POA: Diagnosis not present

## 2016-10-09 DIAGNOSIS — M25562 Pain in left knee: Secondary | ICD-10-CM | POA: Diagnosis not present

## 2016-10-18 MED FILL — ESCITALOPRAM 10 MG TABLET: 10 | 30 days supply | Qty: 30 | Fill #1

## 2016-10-24 DIAGNOSIS — G4733 Obstructive sleep apnea (adult) (pediatric): Secondary | ICD-10-CM | POA: Diagnosis not present

## 2016-10-30 ENCOUNTER — Ambulatory Visit: Payer: 59 | Admitting: Family Medicine

## 2016-10-31 NOTE — Progress Notes (Signed)
[name] [address] [place], [place] [address] Phone: [phone] Subjective:    I'm seeing this patient by the request  of:  Penni Homans, MD   CC: Back pain  [id number]  Misty Mahoney is a 50 y.o. female coming in with complaint of back pain. Been going on 2 years, on and off. Recently has been having increasing discomfort. Patient is seems to be more in the mid back. Patient states it is getting worse and she has had a recent meniscectomy on the left knee. Patient states that she's been doing some exercises but unfortunately continues to have the discomfort. Has been in physical therapy working on her knee and was referred here for further evaluation of her back. Patient is looking for some type of functional changes that she can make the can make it more beneficial. Denies any significant radiation to any of the extremities but does notice some mild radiation to the anterior lateral aspect of the thigh when patient does certain activities. Patient states seems to be more when she is laying on her back at night. Patient rates the severity of pain a 7 out of 10. Nothing that stops her from activity but is significantly annoying.     Past Medical History:  Diagnosis Date  . Anemia    gestational  . Atrial fibrillation ([place])   . Chicken pox as a child  . Depression 06/15/2015  . DM, gestational, diet controlled 04/10/2014  . H/O gestational diabetes mellitus, not currently pregnant 04/10/2014  . History of chicken pox   . Hyperlipidemia 12/20/2015  . Hypertension   . Obesity, unspecified 04/10/2014  . OSA (obstructive sleep apnea)   . Preventative health care 12/26/2015  . SVT (supraventricular tachycardia) ([place])    s/p RFCA 09/26/12  . Vitamin D deficiency 12/26/2015   Past Surgical History:  Procedure Laterality Date  . ADENOIDECTOMY    . CARDIAC ELECTROPHYSIOLOGY STUDY AND ABLATION  2013  . ENDOVENOUS ABLATION SAPHENOUS VEIN W/ LASER Bilateral     Thermal ablation  . KNEE ARTHROSCOPY WITH MENISCAL REPAIR Left 08/25/2016   Procedure: KNEE ARTHROSCOPY WITH partial medial meniscectomy, chondroplasty;  Surgeon: Melrose Nakayama, MD;  Location: [facility];  Service: Orthopedics;  Laterality: Left;  KNEE ARTHROSCOPY WITH partial medial meniscectomy, chondroplasty  . PALATE / UVULA BIOPSY / EXCISION    . TONSILLECTOMY  50 yrs old  . V-TACH ABLATION N/A 09/26/2012   Procedure: V-TACH ABLATION;  Surgeon: Evans Lance, MD;  Location: [facility] CATH LAB;  Service: Cardiovascular;  Laterality: N/A;  . WISDOM TOOTH EXTRACTION  50 yrs old   Social History   Social History  . Marital status: Legally Separated    Spouse name: N/A  . Number of children: N/A  . Years of education: N/A   Social History Main Topics  . Smoking status: Never Smoker  . Smokeless tobacco: Never Used  . Alcohol use Yes     Comment: two or three times a year  . Drug use: No  . Sexual activity: Yes    Partners: Male     Comment: lives with kids, significant other and step daughter. minimizing dairy and gluten, exercise   Other Topics Concern  . None   Social History Narrative  . None   Allergies  Allergen Reactions  . Sunflower Oil Swelling   Family History  Problem Relation Age of Onset  . Hypertension Mother   . Dementia Mother   .  Dementia Father   . Hypertension Father   . Cataracts Father     bilateral  . Alzheimer's disease Father   . Cancer Sister 9    metastatic  . Hypertension Brother   . ADD / ADHD Daughter   . ADD / ADHD Son   . Heart attack Maternal Grandfather     pacemaker  . Cancer Maternal Grandfather     ?  Marland Kitchen Cancer Paternal Grandmother     colon    Past medical history, social, surgical and family history all reviewed in electronic medical record.  No pertanent information unless stated regarding to the chief complaint.   Review of Systems:Review of systems updated and as accurate as of 11/01/16  No headache,  visual changes, nausea, vomiting, diarrhea, constipation, dizziness, abdominal pain, skin rash, fevers, chills, night sweats, weight loss, swollen lymph nodes, chest pain, shortness of breath, mood changes.   Objective  Blood pressure 138/82, pulse 74, height 5\' 8"  (1.727 m), weight 236 lb (107 kg), last menstrual period 10/11/2016, SpO2 96 %. Systems examined below as of 11/01/16   General: No apparent distress alert and oriented x3 mood and affect normal, dressed appropriately.  HEENT: Pupils equal, extraocular movements intact  Respiratory: Patient's speak in full sentences and does not appear short of breath  Cardiovascular: No lower extremity edema, non tender, no erythema  Skin: Warm dry intact with no signs of infection or rash on extremities or on axial skeleton.  Abdomen: Soft nontender  Neuro: Cranial nerves II through XII are intact, neurovascularly intact in all extremities with 2+ DTRs and 2+ pulses.  Lymph: No lymphadenopathy of posterior or anterior cervical chain or axillae bilaterally.  Gait normal with good balance and coordination.  MSK:  Non tender with full range of motion and good stability and symmetric strength and tone of shoulders, elbows, wrist, hip, knee and ankles bilaterally.  Back Exam:  Inspection: poor posture increased kyphosis Motion: Flexion 45 deg, Extension 25 deg, Side Bending to 45 deg bilaterally,  Rotation to 45 deg bilaterally  SLR laying: Negative  XSLR laying: Negative  Palpable tenderness: Tender to palpation and appears palmar musculature mostly at the thoracolumbar juncture on the right side.Marland Kitchen FABER: negative. Significant tightness bilaterally Sensory change: Gross sensation intact to all lumbar and sacral dermatomes.  Reflexes: 2+ at both patellar tendons, 2+ at achilles tendons, Babinski's downgoing.  Strength at foot  Plantar-flexion: 5/5 Dorsi-flexion: 5/5 Eversion: 5/5 Inversion: 5/5  Leg strength  Quad: 5/5 Hamstring: 5/5 Hip flexor:  5/5 Hip abductors: 3+/5 but symmetric Gait unremarkable.   Osteopathic findings C2 flexed rotated and side bent right T3 extended rotated and side bent left L2 flexed rotated and side bent right Sacrum left on left  Procedure note 97110; 15 minutes spent for Therapeutic exercises as stated in above notes.  This included exercises focusing on stretching, strengthening, with significant focus on eccentric aspects.  Low back exercises that included:  Pelvic tilt/bracing instruction to focus on control of the pelvic girdle and lower abdominal muscles  Glute strengthening exercises, focusing on proper firing of the glutes without engaging the low back muscles Proper stretching techniques for maximum relief for the hamstrings, hip flexors, low back and some rotation where tolerated   Proper technique shown and discussed handout in great detail with ATC.  All questions were discussed and answered.     Impression and Recommendations:     This case required medical decision making of moderate complexity.  Note: This dictation was prepared with Dragon dictation along with smaller phrase technology. Any transcriptional errors that result from this process are unintentional.

## 2016-11-01 ENCOUNTER — Ambulatory Visit (INDEPENDENT_AMBULATORY_CARE_PROVIDER_SITE_OTHER)
Admission: RE | Admit: 2016-11-01 | Discharge: 2016-11-01 | Disposition: A | Discharge status: Home / Self Care | Payer: 59 | Source: Ambulatory Visit | Attending: Family Medicine | Admitting: Family Medicine

## 2016-11-01 ENCOUNTER — Encounter: Payer: Self-pay | Admitting: Family Medicine

## 2016-11-01 ENCOUNTER — Ambulatory Visit (INDEPENDENT_AMBULATORY_CARE_PROVIDER_SITE_OTHER): Payer: 59 | Admitting: Family Medicine

## 2016-11-01 VITALS — BP 138/82 | HR 74 | Ht 68.0 in | Wt 236.0 lb

## 2016-11-01 DIAGNOSIS — M545 Low back pain, unspecified: Secondary | ICD-10-CM

## 2016-11-01 DIAGNOSIS — M546 Pain in thoracic spine: Secondary | ICD-10-CM

## 2016-11-01 DIAGNOSIS — M999 Biomechanical lesion, unspecified: Secondary | ICD-10-CM

## 2016-11-01 DIAGNOSIS — M47816 Spondylosis without myelopathy or radiculopathy, lumbar region: Secondary | ICD-10-CM | POA: Diagnosis not present

## 2016-11-01 DIAGNOSIS — E559 Vitamin D deficiency, unspecified: Secondary | ICD-10-CM

## 2016-11-01 DIAGNOSIS — G8929 Other chronic pain: Secondary | ICD-10-CM

## 2016-11-01 DIAGNOSIS — M5134 Other intervertebral disc degeneration, thoracic region: Secondary | ICD-10-CM | POA: Diagnosis not present

## 2016-11-01 NOTE — Assessment & Plan Note (Signed)
Labs ordered. Vitamin D deficiency can cause muscle fatigue and decreased endurance.

## 2016-11-01 NOTE — Assessment & Plan Note (Signed)
Patient has had thoracic back pain. Gives history of greater than 2 years. X-rays pending secondary to patient having a family history of breast cancer. Patient did respond very well to osteopathic manipulation. Work with Product/process development scientist today. We discussed objective is to do in which ones to avoid. Patient is going to work on Personal assistant. We discussed the importance of posture. Patient will do more of an icing regimen and over-the-counter medications. Patient will come back and see me again in 3-4 weeks.

## 2016-11-01 NOTE — Assessment & Plan Note (Signed)
Decision today to treat with OMT was based on Physical Exam  After verbal consent patient was treated with HVLA, ME techniques in thoracic, lumbar and sacral areas  Patient tolerated the procedure well with improvement in symptoms  Patient given exercises, stretches and lifestyle modifications  See medications in patient instructions if given  Patient will follow up in 3-4 weeks  

## 2016-11-01 NOTE — Patient Instructions (Signed)
Good to see you  xrays downstairs today  Ice 20 minutes 2 times daily. Usually after activity and before bed. Exercises 3 times a week.  On wall with heels, butt shoulder and head touching for a goal of 5 minutes daily  Exercises on wall.  Heel and butt touching.  Raise leg 6 inches and hold 2 seconds.  Down slow for count of 4 seconds.  1 set of 30 reps daily on both sides.  Continue the vitamin D  Iron 65mg  daily with 500mg  of vitamin C Turmeric 500mg  twice daily  You will do great but will take some time.  Happy holidays!  See me again in 3 weeks.

## 2016-11-02 ENCOUNTER — Encounter: Payer: Self-pay | Admitting: Family Medicine

## 2016-11-07 ENCOUNTER — Telehealth: Payer: Self-pay | Admitting: *Deleted

## 2016-11-07 ENCOUNTER — Ambulatory Visit (INDEPENDENT_AMBULATORY_CARE_PROVIDER_SITE_OTHER): Payer: 59 | Admitting: Family Medicine

## 2016-11-07 ENCOUNTER — Encounter: Payer: Self-pay | Admitting: Family Medicine

## 2016-11-07 ENCOUNTER — Other Ambulatory Visit: Payer: Self-pay | Admitting: Family Medicine

## 2016-11-07 VITALS — BP 126/88 | HR 61 | Temp 98.1°F | Resp 16 | Ht 68.0 in | Wt 239.4 lb

## 2016-11-07 DIAGNOSIS — M999 Biomechanical lesion, unspecified: Secondary | ICD-10-CM | POA: Diagnosis not present

## 2016-11-07 DIAGNOSIS — E559 Vitamin D deficiency, unspecified: Secondary | ICD-10-CM

## 2016-11-07 DIAGNOSIS — I10 Essential (primary) hypertension: Secondary | ICD-10-CM

## 2016-11-07 DIAGNOSIS — D509 Iron deficiency anemia, unspecified: Secondary | ICD-10-CM

## 2016-11-07 DIAGNOSIS — E785 Hyperlipidemia, unspecified: Secondary | ICD-10-CM

## 2016-11-07 DIAGNOSIS — R790 Abnormal level of blood mineral: Secondary | ICD-10-CM

## 2016-11-07 DIAGNOSIS — R739 Hyperglycemia, unspecified: Secondary | ICD-10-CM

## 2016-11-07 DIAGNOSIS — Z1211 Encounter for screening for malignant neoplasm of colon: Secondary | ICD-10-CM

## 2016-11-07 DIAGNOSIS — E6609 Other obesity due to excess calories: Secondary | ICD-10-CM

## 2016-11-07 LAB — COMPREHENSIVE METABOLIC PANEL
ALBUMIN: 4 g/dL (ref 3.5–5.2)
ALK PHOS: 43 U/L (ref 39–117)
ALT: 15 U/L (ref 0–35)
AST: 15 U/L (ref 0–37)
BILIRUBIN TOTAL: 0.4 mg/dL (ref 0.2–1.2)
BUN: 23 mg/dL (ref 6–23)
CALCIUM: 9 mg/dL (ref 8.4–10.5)
CHLORIDE: 104 meq/L (ref 96–112)
CO2: 30 mEq/L (ref 19–32)
CREATININE: 0.61 mg/dL (ref 0.40–1.20)
GFR: 109.72 mL/min (ref >60.00)
Glucose, Bld: 100 mg/dL — ABNORMAL HIGH (ref 70–99)
Potassium: 4.7 mEq/L (ref 3.5–5.1)
SODIUM: 139 meq/L (ref 135–145)
TOTAL PROTEIN: 7.1 g/dL (ref 6.0–8.3)

## 2016-11-07 LAB — LIPID PANEL
CHOLESTEROL: 178 mg/dL (ref 0–200)
HDL: 59.5 mg/dL (ref >39.00)
LDL CALC: 104 mg/dL — AB (ref 0–99)
NonHDL: 118.73
Total CHOL/HDL Ratio: 3
Triglycerides: 74 mg/dL (ref 0.0–149.0)
VLDL: 14.8 mg/dL (ref 0.0–40.0)

## 2016-11-07 LAB — CBC
HEMATOCRIT: 32.2 % — AB (ref 36.0–46.0)
HEMOGLOBIN: 10.8 g/dL — AB (ref 12.0–15.0)
MCHC: 33.4 g/dL (ref 30.0–36.0)
MCV: 85.6 fl (ref 78.0–100.0)
PLATELETS: 280 10*3/uL (ref 150.0–400.0)
RBC: 3.76 Mil/uL — ABNORMAL LOW (ref 3.87–5.11)
RDW: 14.1 % (ref 11.5–15.5)
WBC: 6.9 10*3/uL (ref 4.0–10.5)

## 2016-11-07 LAB — FERRITIN: Ferritin: 9.3 ng/mL — ABNORMAL LOW (ref 10.0–291.0)

## 2016-11-07 LAB — HEMOGLOBIN A1C: Hgb A1c MFr Bld: 5.6 % (ref 4.6–6.5)

## 2016-11-07 LAB — VITAMIN D 25 HYDROXY (VIT D DEFICIENCY, FRACTURES): VITD: 36.73 ng/mL (ref 30.00–100.00)

## 2016-11-07 LAB — TSH: TSH: 1 u[IU]/mL (ref 0.35–4.50)

## 2016-11-07 LAB — SEDIMENTATION RATE: SED RATE: 18 mm/h (ref 0–30)

## 2016-11-07 MED ORDER — ERGOCALCIFEROL 1.25 MG (50000 UT) PO CAPS
50000.0000 [IU] | ORAL_CAPSULE | ORAL | 1 refills | Status: DC
Start: 2016-11-07 — End: 2017-05-01

## 2016-11-07 MED ORDER — FERROUS FUMARATE 324 (106 FE) MG PO TABS: 1.0000 | ORAL_TABLET | Freq: Every day | ORAL | 3 refills | Status: DC

## 2016-11-07 MED ORDER — ESCITALOPRAM OXALATE 10 MG PO TABS: 10.0000 mg | ORAL_TABLET | Freq: Every day | ORAL | 1 refills | Status: DC

## 2016-11-07 MED FILL — FERROCITE TABLET: 324 | 30 days supply | Qty: 30 | Fill #0

## 2016-11-07 MED FILL — ESCITALOPRAM 10 MG TABLET: 10 | 90 days supply | Qty: 90 | Fill #0

## 2016-11-07 NOTE — Telephone Encounter (Signed)
See my chart message

## 2016-11-07 NOTE — Assessment & Plan Note (Signed)
Continues to struggle with

## 2016-11-07 NOTE — Progress Notes (Signed)
Subjective:    Patient ID: Misty Mahoney, female    DOB: [birth year], 50 y.o.   MRN: [id number]  Chief Complaint  Patient presents with  . Follow-up    blood pressure and medication (started on lexapro in october)    HPI Patient is in today for follow up on blood pressure and Lexapro that was started in October. Blood pressures have been running around 138/88, but have not been high.  Has been tolerating the Lexapro well.  No acute problems today. She feels her mood has improved and she has had no concerning side effects on the medications. No polyuria or polydipsia. Denies CP/palp/SOB/HA/congestion/fevers/GI or GU c/o. Taking meds as prescribed  Past Medical History:  Diagnosis Date  . Anemia    gestational  . Atrial fibrillation ([place])   . Chicken pox as a child  . Depression 06/15/2015  . DM, gestational, diet controlled 04/10/2014  . H/O gestational diabetes mellitus, not currently pregnant 04/10/2014  . History of chicken pox   . Hyperlipidemia 12/20/2015  . Hypertension   . Obesity, unspecified 04/10/2014  . OSA (obstructive sleep apnea)   . Preventative health care 12/26/2015  . SVT (supraventricular tachycardia) ([place])    s/p RFCA 09/26/12  . Vitamin D deficiency 12/26/2015    Past Surgical History:  Procedure Laterality Date  . ADENOIDECTOMY    . CARDIAC ELECTROPHYSIOLOGY STUDY AND ABLATION  2013  . ENDOVENOUS ABLATION SAPHENOUS VEIN W/ LASER Bilateral    Thermal ablation  . KNEE ARTHROSCOPY WITH MENISCAL REPAIR Left 08/25/2016   Procedure: KNEE ARTHROSCOPY WITH partial medial meniscectomy, chondroplasty;  Surgeon: Melrose Nakayama, MD;  Location: [place];  Service: Orthopedics;  Laterality: Left;  KNEE ARTHROSCOPY WITH partial medial meniscectomy, chondroplasty  . PALATE / UVULA BIOPSY / EXCISION    . TONSILLECTOMY  50 yrs old  . V-TACH ABLATION N/A 09/26/2012   Procedure: V-TACH ABLATION;  Surgeon: Evans Lance, MD;  Location: [facility] CATH LAB;  Service:  Cardiovascular;  Laterality: N/A;  . WISDOM TOOTH EXTRACTION  50 yrs old    Family History  Problem Relation Age of Onset  . Hypertension Mother   . Dementia Mother   . Dementia Father   . Hypertension Father   . Cataracts Father     bilateral  . Alzheimer's disease Father   . Cancer Sister 30    metastatic  . Hypertension Brother   . ADD / ADHD Daughter   . ADD / ADHD Son   . Heart attack Maternal Grandfather     pacemaker  . Cancer Maternal Grandfather     ?  Marland Kitchen Cancer Paternal Grandmother     colon    Social History   Social History  . Marital status: Legally Separated    Spouse name: N/A  . Number of children: N/A  . Years of education: N/A   Occupational History  . Not on file.   Social History Main Topics  . Smoking status: Never Smoker  . Smokeless tobacco: Never Used  . Alcohol use Yes     Comment: two or three times a year  . Drug use: No  . Sexual activity: Yes    Partners: Male     Comment: lives with kids, significant other and step daughter. minimizing dairy and gluten, exercise   Other Topics Concern  . Not on file   Social History Narrative  . No narrative on file    Outpatient Medications Prior to Visit  Medication  Sig Dispense Refill  . diltiazem (CARTIA XT) 180 MG 24 hr capsule Take 1 capsule (180 mg total) by mouth daily. 90 capsule 3  . valsartan-hydrochlorothiazide (DIOVAN-HCT) 320-12.5 MG tablet Take 1 tablet by mouth daily. 90 tablet 3  . escitalopram (LEXAPRO) 10 MG tablet Take 1 tablet (10 mg total) by mouth daily. 30 tablet 2   No facility-administered medications prior to visit.     Allergies  Allergen Reactions  . Sunflower Oil Swelling    Review of Systems  Constitutional: Negative for fever.  Eyes: Negative for blurred vision.  Respiratory: Negative for cough and shortness of breath.   Cardiovascular: Negative for chest pain and palpitations.  Gastrointestinal: Negative for vomiting.  Musculoskeletal: Negative for  back pain.  Skin: Negative for rash.  Neurological: Negative for loss of consciousness and headaches.       Objective:    Physical Exam  Constitutional: She is oriented to person, place, and time. She appears well-developed and well-nourished. No distress.  HENT:  Head: Normocephalic and atraumatic.  Eyes: Conjunctivae are normal.  Neck: Normal range of motion. No thyromegaly present.  Cardiovascular: Normal rate and regular rhythm.   Pulmonary/Chest: Effort normal and breath sounds normal. She has no wheezes.  Abdominal: Soft. Bowel sounds are normal. There is no tenderness.  Musculoskeletal: Normal range of motion. She exhibits no edema or deformity.  Neurological: She is alert and oriented to person, place, and time.  Skin: Skin is warm and dry. She is not diaphoretic.  Psychiatric: She has a normal mood and affect.    BP 126/88 (BP Location: Left Arm, Cuff Size: Large)   Pulse 61   Temp 98.1 F (36.7 C) (Oral)   Resp 16   Ht 5\' 8"  (1.727 m)   Wt 239 lb 6.4 oz (108.6 kg)   LMP 10/31/2016   SpO2 97%   BMI 36.40 kg/m  Wt Readings from Last 3 Encounters:  11/07/16 239 lb 6.4 oz (108.6 kg)  11/01/16 236 lb (107 kg)  08/29/16 236 lb 4 oz (107.2 kg)     Lab Results  Component Value Date   WBC 6.9 11/07/2016   HGB 10.8 (L) 11/07/2016   HCT 32.2 (L) 11/07/2016   PLT 280.0 11/07/2016   GLUCOSE 100 (H) 11/07/2016   CHOL 178 11/07/2016   TRIG 74.0 11/07/2016   HDL 59.50 11/07/2016   LDLCALC 104 (H) 11/07/2016   ALT 15 11/07/2016   AST 15 11/07/2016   NA 139 11/07/2016   K 4.7 11/07/2016   CL 104 11/07/2016   CREATININE 0.61 11/07/2016   BUN 23 11/07/2016   CO2 30 11/07/2016   TSH 1.00 11/07/2016   HGBA1C 5.6 11/07/2016    Lab Results  Component Value Date   TSH 1.00 11/07/2016   Lab Results  Component Value Date   WBC 6.9 11/07/2016   HGB 10.8 (L) 11/07/2016   HCT 32.2 (L) 11/07/2016   MCV 85.6 11/07/2016   PLT 280.0 11/07/2016   Lab Results    Component Value Date   NA 139 11/07/2016   K 4.7 11/07/2016   CO2 30 11/07/2016   GLUCOSE 100 (H) 11/07/2016   BUN 23 11/07/2016   CREATININE 0.61 11/07/2016   BILITOT 0.4 11/07/2016   ALKPHOS 43 11/07/2016   AST 15 11/07/2016   ALT 15 11/07/2016   PROT 7.1 11/07/2016   ALBUMIN 4.0 11/07/2016   CALCIUM 9.0 11/07/2016   ANIONGAP 12 08/21/2016   GFR 109.72 11/07/2016   Lab  Results  Component Value Date   CHOL 178 11/07/2016   Lab Results  Component Value Date   HDL 59.50 11/07/2016   Lab Results  Component Value Date   LDLCALC 104 (H) 11/07/2016   Lab Results  Component Value Date   TRIG 74.0 11/07/2016   Lab Results  Component Value Date   CHOLHDL 3 11/07/2016   Lab Results  Component Value Date   HGBA1C 5.6 11/07/2016      I acted as a Education administrator for Dr. Charlett Blake.  Guerry Bruin, [place].  Assessment & Plan:   Problem List Items Addressed This Visit    Hypertension - Primary    Well controlled, no changes to meds. Encouraged heart healthy diet such as the DASH diet and exercise as tolerated.       Relevant Orders   CBC (Completed)   TSH (Completed)   Comprehensive metabolic panel (Completed)   Anemia    Continues to struggle with       Relevant Orders   Ferritin (Completed)   Obesity    Encouraged DASH diet, decrease po intake and increase exercise as tolerated. Needs 7-8 hours of sleep nightly. Avoid trans fats, eat small, frequent meals every 4-5 hours with lean proteins, complex carbs and healthy fats. Minimize simple carbs,      Hyperlipidemia    Encouraged heart healthy diet, increase exercise, avoid trans fats, consider a krill oil cap daily      Relevant Orders   Lipid panel (Completed)   Hyperglycemia    hgba1c acceptable, minimize simple carbs. Increase exercise as tolerated.       Relevant Orders   Hemoglobin A1c (Completed)   Vitamin D deficiency    Taking Vitamin D 2000 IU daily, took last dose of vitamin D 50000 roughly a week ago, level  wnl continue 2000 to 3000 IU daily      Relevant Orders   VITAMIN D 25 Hydroxy (Vit-D Deficiency, Fractures) (Completed)   Nonallopathic lesion of lumbosacral region   Relevant Orders   Sedimentation rate (Completed)    Other Visit Diagnoses    Screening for colon cancer       Relevant Orders   Ambulatory referral to Gastroenterology      I have changed Ms. Sandefer's ergocalciferol. I am also having her maintain her diltiazem, valsartan-hydrochlorothiazide, Vitamin D, Omega-3 Fatty Acids (FISH OIL PO), Melatonin, and escitalopram.  Meds ordered this encounter  Medications  . Cholecalciferol (VITAMIN D) 2000 units CAPS    Sig: Take 1 capsule by mouth daily.  Marland Kitchen DISCONTD: ergocalciferol (VITAMIN D2) 50000 units capsule    Sig: Take 50,000 Units by mouth once a week.  . Omega-3 Fatty Acids (FISH OIL PO)    Sig: Take 1 capsule by mouth daily.  . Melatonin 5 MG CAPS    Sig: Take 1 capsule by mouth at bedtime.  . ergocalciferol (VITAMIN D2) 50000 units capsule    Sig: Take 1 capsule (50,000 Units total) by mouth once a week.    Dispense:  12 capsule    Refill:  1  . escitalopram (LEXAPRO) 10 MG tablet    Sig: Take 1 tablet (10 mg total) by mouth daily.    Dispense:  90 tablet    Refill:  1   CMA served as Education administrator during this visit. History, Physical and Plan performed by medical provider. Documentation and orders reviewed and attested to.   Penni Homans, MD

## 2016-11-07 NOTE — Progress Notes (Signed)
Pre visit review using our clinic review tool, if applicable. No additional management support is needed unless otherwise documented below in the visit note. 

## 2016-11-07 NOTE — Patient Instructions (Addendum)
Go to [facility] and pull up [employer] for Bariatric Medicine or email.  DASH Eating Plan DASH stands for "Dietary Approaches to Stop Hypertension." The DASH eating plan is a healthy eating plan that has been shown to reduce high blood pressure (hypertension). Additional health benefits may include reducing the risk of type 2 diabetes mellitus, heart disease, and stroke. The DASH eating plan may also help with weight loss. What do I need to know about the DASH eating plan? For the DASH eating plan, you will follow these general guidelines:  Choose foods with less than 150 milligrams of sodium per serving (as listed on the food label).  Use salt-free seasonings or herbs instead of table salt or sea salt.  Check with your health care provider or pharmacist before using salt substitutes.  Eat lower-sodium products. These are often labeled as "low-sodium" or "no salt added."  Eat fresh foods. Avoid eating a lot of canned foods.  Eat more vegetables, fruits, and low-fat dairy products.  Choose whole grains. Look for the word "whole" as the first word in the ingredient list.  Choose fish and skinless chicken or [place] more often than red meat. Limit fish, poultry, and meat to 6 oz (170 g) each day.  Limit sweets, desserts, sugars, and sugary drinks.  Choose heart-healthy fats.  Eat more home-cooked food and less restaurant, buffet, and fast food.  Limit fried foods.  Do not fry foods. Cook foods using methods such as baking, boiling, grilling, and broiling instead.  When eating at a restaurant, ask that your food be prepared with less salt, or no salt if possible. What foods can I eat? Seek help from a dietitian for individual calorie needs. Grains  Whole grain or whole wheat bread. Brown rice. Whole grain or whole wheat pasta. Quinoa, bulgur, and whole grain cereals. Low-sodium cereals. Corn or whole wheat flour tortillas. Whole grain cornbread. Whole grain crackers.  Low-sodium crackers. Vegetables  Fresh or frozen vegetables (raw, steamed, roasted, or grilled). Low-sodium or reduced-sodium tomato and vegetable juices. Low-sodium or reduced-sodium tomato sauce and paste. Low-sodium or reduced-sodium canned vegetables. Fruits  All fresh, canned (in natural juice), or frozen fruits. Meat and Other Protein Products  Ground beef (85% or leaner), grass-fed beef, or beef trimmed of fat. Skinless chicken or [place]. Ground chicken or [place]. Pork trimmed of fat. All fish and seafood. Eggs. Dried beans, peas, or lentils. Unsalted nuts and seeds. Unsalted canned beans. Dairy  Low-fat dairy products, such as skim or 1% milk, 2% or reduced-fat cheeses, low-fat ricotta or cottage cheese, or plain low-fat yogurt. Low-sodium or reduced-sodium cheeses. Fats and Oils  Tub margarines without trans fats. Light or reduced-fat mayonnaise and salad dressings (reduced sodium). Avocado. Safflower, olive, or canola oils. Natural peanut or almond butter. Other  Unsalted popcorn and pretzels. The items listed above may not be a complete list of recommended foods or beverages. Contact your dietitian for more options.  What foods are not recommended? Grains  White bread. White pasta. White rice. Refined cornbread. Bagels and croissants. Crackers that contain trans fat. Vegetables  Creamed or fried vegetables. Vegetables in a cheese sauce. Regular canned vegetables. Regular canned tomato sauce and paste. Regular tomato and vegetable juices. Fruits  Canned fruit in light or heavy syrup. Fruit juice. Meat and Other Protein Products  Fatty cuts of meat. Ribs, chicken wings, bacon, sausage, bologna, salami, chitterlings, fatback, hot dogs, bratwurst, and packaged luncheon meats. Salted nuts and seeds. Canned beans with salt. Dairy  Whole or 2% milk, cream, half-and-half, and cream cheese. Whole-fat or sweetened yogurt. Full-fat cheeses or blue cheese. Nondairy creamers and whipped  toppings. Processed cheese, cheese spreads, or cheese curds. Condiments  Onion and garlic salt, seasoned salt, table salt, and sea salt. Canned and packaged gravies. Worcestershire sauce. Tartar sauce. Barbecue sauce. Teriyaki sauce. Soy sauce, including reduced sodium. Steak sauce. Fish sauce. Oyster sauce. Cocktail sauce. Horseradish. Ketchup and mustard. Meat flavorings and tenderizers. Bouillon cubes. Hot sauce. Tabasco sauce. Marinades. Taco seasonings. Relishes. Fats and Oils  Butter, stick margarine, lard, shortening, ghee, and bacon fat. Coconut, palm kernel, or palm oils. Regular salad dressings. Other  Pickles and olives. Salted popcorn and pretzels. The items listed above may not be a complete list of foods and beverages to avoid. Contact your dietitian for more information.  Where can I find more information? National Heart, Lung, and Blood Institute: travelstabloid.com This information is not intended to replace advice given to you by your health care provider. Make sure you discuss any questions you have with your health care provider. Document Released: 10/26/2011 Document Revised: 04/13/2016 Document Reviewed: 09/10/2013 Elsevier Interactive Patient Education  2017 [employer].

## 2016-11-07 NOTE — Assessment & Plan Note (Addendum)
Taking Vitamin D 2000 IU daily, took last dose of vitamin D 50000 roughly a week ago, level wnl continue 2000 to 3000 IU daily

## 2016-11-20 MED FILL — CARTIA XT 180 MG CAPSULE SA: 180 | 90 days supply | Qty: 90 | Fill #0

## 2016-11-22 NOTE — Assessment & Plan Note (Signed)
Encouraged DASH diet, decrease po intake and increase exercise as tolerated. Needs 7-8 hours of sleep nightly. Avoid trans fats, eat small, frequent meals every 4-5 hours with lean proteins, complex carbs and healthy fats. Minimize simple carbs, 

## 2016-11-22 NOTE — Assessment & Plan Note (Signed)
hgba1c acceptable, minimize simple carbs. Increase exercise as tolerated.  

## 2016-11-22 NOTE — Assessment & Plan Note (Signed)
Encouraged heart healthy diet, increase exercise, avoid trans fats, consider a krill oil cap daily 

## 2016-11-22 NOTE — Assessment & Plan Note (Signed)
Well controlled, no changes to meds. Encouraged heart healthy diet such as the DASH diet and exercise as tolerated.  °

## 2016-11-24 DIAGNOSIS — G4733 Obstructive sleep apnea (adult) (pediatric): Secondary | ICD-10-CM | POA: Diagnosis not present

## 2016-12-08 ENCOUNTER — Encounter: Payer: Self-pay | Admitting: Family Medicine

## 2016-12-22 MED FILL — FERROCITE TABLET: 324 | 30 days supply | Qty: 30 | Fill #1

## 2016-12-22 MED FILL — VALSARTAN-HCTZ 320-12.5 MG: 320-12.5 | 90 days supply | Qty: 90 | Fill #1

## 2016-12-25 DIAGNOSIS — G4733 Obstructive sleep apnea (adult) (pediatric): Secondary | ICD-10-CM | POA: Diagnosis not present

## 2016-12-26 DIAGNOSIS — G4733 Obstructive sleep apnea (adult) (pediatric): Secondary | ICD-10-CM | POA: Diagnosis not present

## 2017-01-03 DIAGNOSIS — G4733 Obstructive sleep apnea (adult) (pediatric): Secondary | ICD-10-CM | POA: Diagnosis not present

## 2017-01-09 ENCOUNTER — Ambulatory Visit: Payer: Self-pay | Admitting: Family Medicine

## 2017-01-15 NOTE — Progress Notes (Signed)
[facility] [address] [place], [place] [address] Phone: [phone] Subjective:    I'm seeing this patient by the request  of:  Penni Homans, MD   CC: Back pain f/u   [id number]  JEHNNA Misty Mahoney is a 51 y.o. female coming in with complaint of back pain. Found to have more of a thoracic back pain that is likely secondary to poor posture. Responded fairly well to osteopathic manipulation 2 months ago. Patient was to do vitamin D supplementation, icing regimen, and home exercises. Patient states Doing well overall. Patient states that she has been doing exercises working on the posture. States that overall maybe not as much severity. Still having fairly good amount of frequency. Patient denies any radiation to the legs except for mild burning sensation of the anterior thigh from time to time.   did have previous x-rays done. Patient did have some very mild disc space narrowing but very minimal overall the thoracic and lumbar spine.  Past Medical History:  Diagnosis Date  . Anemia    gestational  . Atrial fibrillation ([place])   . Chicken pox as a child  . Depression 06/15/2015  . DM, gestational, diet controlled 04/10/2014  . H/O gestational diabetes mellitus, not currently pregnant 04/10/2014  . History of chicken pox   . Hyperlipidemia 12/20/2015  . Hypertension   . Obesity, unspecified 04/10/2014  . OSA (obstructive sleep apnea)   . Preventative health care 12/26/2015  . SVT (supraventricular tachycardia) ([place])    s/p RFCA 09/26/12  . Vitamin D deficiency 12/26/2015   Past Surgical History:  Procedure Laterality Date  . ADENOIDECTOMY    . CARDIAC ELECTROPHYSIOLOGY STUDY AND ABLATION  2013  . ENDOVENOUS ABLATION SAPHENOUS VEIN W/ LASER Bilateral    Thermal ablation  . KNEE ARTHROSCOPY WITH MENISCAL REPAIR Left 08/25/2016   Procedure: KNEE ARTHROSCOPY WITH partial medial meniscectomy, chondroplasty;  Surgeon: Melrose Nakayama, MD;  Location: [place];  Service: Orthopedics;  Laterality: Left;  KNEE ARTHROSCOPY WITH partial medial meniscectomy, chondroplasty  . PALATE / UVULA BIOPSY / EXCISION    . TONSILLECTOMY  51 yrs old  . V-TACH ABLATION N/A 09/26/2012   Procedure: V-TACH ABLATION;  Surgeon: Evans Lance, MD;  Location: [facility] CATH LAB;  Service: Cardiovascular;  Laterality: N/A;  . WISDOM TOOTH EXTRACTION  51 yrs old   Social History   Social History  . Marital status: Legally Separated    Spouse name: N/A  . Number of children: N/A  . Years of education: N/A   Social History Main Topics  . Smoking status: Never Smoker  . Smokeless tobacco: Never Used  . Alcohol use Yes     Comment: two or three times a year  . Drug use: No  . Sexual activity: Yes    Partners: Male     Comment: lives with kids, significant other and step daughter. minimizing dairy and gluten, exercise   Other Topics Concern  . None   Social History Narrative  . None   Allergies  Allergen Reactions  . Sunflower Oil Swelling   Family History  Problem Relation Age of Onset  . Hypertension Mother   . Dementia Mother   . Dementia Father   . Hypertension Father   . Cataracts Father     bilateral  . Alzheimer's disease Father   . Cancer Sister 33    metastatic  . Hypertension Brother   . ADD / ADHD Daughter   .  ADD / ADHD Son   . Heart attack Maternal Grandfather     pacemaker  . Cancer Maternal Grandfather     ?  Marland Kitchen Cancer Paternal Grandmother     colon    Past medical history, social, surgical and family history all reviewed in electronic medical record.  No pertanent information unless stated regarding to the chief complaint.   Review of Systems: No headache, visual changes, nausea, vomiting, diarrhea, constipation, dizziness, abdominal pain, skin rash, fevers, chills, night sweats, weight loss, swollen lymph nodes, body aches, joint swelling, muscle aches, chest pain, shortness of breath, mood changes.    Objective    Blood pressure 138/90, pulse 66, height 5\' 8"  (1.727 m), weight 237 lb (107.5 kg). Systems examined below as of 01/16/17   General: No apparent distress alert and oriented x3 mood and affect normal, dressed appropriately.  HEENT: Pupils equal, extraocular movements intact  Respiratory: Patient's speak in full sentences and does not appear short of breath  Cardiovascular: No lower extremity edema, non tender, no erythema  Skin: Warm dry intact with no signs of infection or rash on extremities or on axial skeleton.  Abdomen: Soft nontender  Neuro: Cranial nerves II through XII are intact, neurovascularly intact in all extremities with 2+ DTRs and 2+ pulses.  Lymph: No lymphadenopathy of posterior or anterior cervical chain or axillae bilaterally.  Gait normal with good balance and coordination.  MSK:  Non tender with full range of motion and good stability and symmetric strength and tone of shoulders, elbows, wrist, hip, knee and ankles bilaterally.  Back Exam:  Inspection: poor posture increased kyphosis possibly a mild improvement in posture. Motion: Flexion 45 deg, Extension 25 deg, Side Bending to 45 deg bilaterally,  Rotation to 45 deg bilaterally  SLR laying: Negative  XSLR laying: Negative  Palpable tenderness: Continue mild tenderness to palpation and appears palmar musculature of the thoracolumbar juncture bilaterally FABER: negative.  Sensory change: Gross sensation intact to all lumbar and sacral dermatomes.  Reflexes: 2+ at both patellar tendons, 2+ at achilles tendons, Babinski's downgoing.  Strength at foot  Plantar-flexion: 5/5 Dorsi-flexion: 5/5 Eversion: 5/5 Inversion: 5/5  Leg strength  Quad: 5/5 Hamstring: 5/5 Hip flexor: 5/5 Hip abductors: 3+/5 but symmetric Gait unremarkable.   Osteopathic findings Cervical C2 flexed rotated and side bent right C4 flexed rotated and side bent left C6 flexed rotated and side bent left T3 extended rotated and side bent right  inhaled third rib T9 extended rotated and side bent left L2 flexed rotated and side bent right Sacrum right on right       Impression and Recommendations:     This case required medical decision making of moderate complexity.      Note: This dictation was prepared with Dragon dictation along with smaller phrase technology. Any transcriptional errors that result from this process are unintentional.

## 2017-01-16 ENCOUNTER — Ambulatory Visit (INDEPENDENT_AMBULATORY_CARE_PROVIDER_SITE_OTHER): Payer: 59 | Admitting: Family Medicine

## 2017-01-16 ENCOUNTER — Encounter: Payer: Self-pay | Admitting: Family Medicine

## 2017-01-16 VITALS — BP 138/90 | HR 66 | Ht 68.0 in | Wt 237.0 lb

## 2017-01-16 DIAGNOSIS — G8929 Other chronic pain: Secondary | ICD-10-CM | POA: Diagnosis not present

## 2017-01-16 DIAGNOSIS — M999 Biomechanical lesion, unspecified: Secondary | ICD-10-CM | POA: Diagnosis not present

## 2017-01-16 DIAGNOSIS — M546 Pain in thoracic spine: Secondary | ICD-10-CM | POA: Diagnosis not present

## 2017-01-16 NOTE — Assessment & Plan Note (Signed)
Decision today to treat with OMT was based on Physical Exam  After verbal consent patient was treated with HVLA, ME, FPR techniques in cervical, thoracic, lumbar and sacral areas  Patient tolerated the procedure well with improvement in symptoms  Patient given exercises, stretches and lifestyle modifications  See medications in patient instructions if given  Patient will follow up in 6-8 weeks 

## 2017-01-16 NOTE — Patient Instructions (Signed)
Good to see you  Posture, posture, posture You are doing great  Continue the vitamins When in pain ice is your friend.  See me again in 6-8 weeks

## 2017-01-16 NOTE — Assessment & Plan Note (Signed)
Discussed posture and ergonomics. Patient has made some improvement. Does respond well to manipulation. We discussed icing regimen and home exercises. Encourage patient to change some ergonomics are up-to-date. Follow-up again in 6-8 weeks

## 2017-02-15 MED FILL — CARTIA XT 180 MG CAPSULE SA: 180 | 90 days supply | Qty: 90 | Fill #1

## 2017-02-15 MED FILL — ESCITALOPRAM 10 MG TABLET: 10 | 90 days supply | Qty: 90 | Fill #1

## 2017-02-16 ENCOUNTER — Telehealth: Payer: Self-pay | Admitting: Internal Medicine

## 2017-02-16 NOTE — Telephone Encounter (Signed)
Follow Up :   Misty Mahoney is calling about the Flecainide 150 mg . States that Dr. Caryl Comes prescribe this to her as needed for her AFIB . Please call if you have any questions

## 2017-02-16 NOTE — Telephone Encounter (Signed)
Please advise as this medication is not listed on current medication list. Thanks, MI

## 2017-02-16 NOTE — Telephone Encounter (Signed)
New Message  *STAT* If patient is at the pharmacy, call can be transferred to refill team.   1. Which medications need to be refilled? (please list name of each medication and dose if known) flecainide 150 mg twice daily  2. Which pharmacy/location (including street and city if local pharmacy) is medication to be sent to? [facility], [facility], [phone]  3. Do they need a 30 day or 90 day supply? 30 day supply  Pt voiced for someone to give her a call once it's sent.  Pt needs it called in due to her being out of town.

## 2017-02-16 NOTE — Telephone Encounter (Signed)
Will forward to Dr. Klein to review. 

## 2017-02-16 NOTE — Telephone Encounter (Signed)
Follow up  Pt wanting nurse to give her a call.

## 2017-02-16 NOTE — Telephone Encounter (Signed)
I called the pt to inform her that her PCP D/C the medication Flecainide 150 mg tablet on 08/29/16, per Dr. Penni Homans, I have discontinued Ms. Pennick's acetaminophen, ibuprofen, Vitamin D, Omega-3 Fatty Acids (FISH OIL PO), flecainide, and HYDROcodone-acetaminophen. I have also changed her valsartan-hydrochlorothiazide. Additionally, I am having her start on escitalopram. Lastly, I am having her maintain her diltiazem. Pt did not want to hear this and ask to speak with Dr. Olin Pia nurse, I advised the pt to call back and ask to speak to Dr. Olin Pia nurse. Pt verbalized understanding.

## 2017-02-17 NOTE — Telephone Encounter (Signed)
Misty Mahoney   Your note 10/17 talks about d/c this pts flecainide but I dont see the reason   We have been using it to control her paroxysms.   Please let me know your thoughts so I can stay on the same page with you Either by staff or more helpfully call me [phone] Thanks

## 2017-02-18 NOTE — Telephone Encounter (Signed)
Sorry out of office this week. Have my CMA checking with her. This is certainly not a medicine I would normally discontinue so will see if she had stopped herself or if we took off of list incorrectly. That was a particularly rough day as her sister had died that day, doubt we would have actively addressed much else. I will get back to you.

## 2017-02-19 ENCOUNTER — Telehealth: Payer: Self-pay | Admitting: Family Medicine

## 2017-02-19 NOTE — Telephone Encounter (Signed)
Called left message to call back 

## 2017-02-19 NOTE — Telephone Encounter (Signed)
-----   Message from Mosie Lukes, MD sent at 02/18/2017  8:18 AM EDT ----- I do not have any recollection of stopping this patient's Flecainide and not a medicine I would typically stop. Check with patient and see if she had stopped it for some reason I annot recall and let me know. Her cardiologist is questioning. Dr Jacinto Reap

## 2017-02-20 NOTE — Telephone Encounter (Signed)
I spoke to the patient and this medication was taken off her list most likely at her last appointment in our office by the assistant checking her in. She stated she was not taking and most likely was taken off the list by mistake since she stated "not taking".  So she would like it put back on her list for the future if she needs for AFIB.

## 2017-02-25 NOTE — Telephone Encounter (Signed)
OK thanks for checking with her please add back to her list for as needed use.

## 2017-02-26 NOTE — Telephone Encounter (Signed)
Added back on to list.

## 2017-02-28 DIAGNOSIS — H5211 Myopia, right eye: Secondary | ICD-10-CM | POA: Diagnosis not present

## 2017-03-20 MED FILL — FERROCITE TABLET: 324 | 30 days supply | Qty: 30 | Fill #2

## 2017-03-20 MED FILL — VIT D2 1.25 MG (50,000 UNIT: 1.25 MG | 84 days supply | Qty: 12 | Fill #0

## 2017-03-20 MED FILL — VALSARTAN-HCTZ 320-12.5 MG: 320-12.5 | 90 days supply | Qty: 90 | Fill #2

## 2017-03-26 DIAGNOSIS — G4733 Obstructive sleep apnea (adult) (pediatric): Secondary | ICD-10-CM | POA: Diagnosis not present

## 2017-03-26 NOTE — Progress Notes (Signed)
[name] [address], [place] [address] Phone: [phone] Subjective:    I'm seeing this patient by the request  of:  Mosie Lukes, MD   CC: Back pain f/u   PTW:[id number]  Misty Mahoney is a 51 y.o. female coming in with complaint of back pain. Found to have more of a thoracic back pain that is likely secondary to poor posture. Responded fairly well to osteopathic manipulation 2 months ago. Patient was to do vitamin D supplementation, icing regimen, and home exercises. Patient states Doing well overall. Patient states inflammation as he exercises she seems to do relatively well. Patient states and she seems to continue to make some improvement. Nothing new. Same pain as previously.  Patient does have a new prone. Right elbow pain. Seems to be on the posterior aspect the elbow worse with pushing sensations. Patient does do a lot of repetitive activity at work. Does not remember any specific time when there was an injury.   did have previous x-rays done. Patient did have some very mild disc space narrowing but very minimal overall the thoracic and lumbar spine.  Past Medical History:  Diagnosis Date  . Anemia    gestational  . Atrial fibrillation ([place])   . Chicken pox as a child  . Depression 06/15/2015  . DM, gestational, diet controlled 04/10/2014  . H/O gestational diabetes mellitus, not currently pregnant 04/10/2014  . History of chicken pox   . Hyperlipidemia 12/20/2015  . Hypertension   . Obesity, unspecified 04/10/2014  . OSA (obstructive sleep apnea)   . Preventative health care 12/26/2015  . SVT (supraventricular tachycardia) ([place])    s/p RFCA 09/26/12  . Vitamin D deficiency 12/26/2015   Past Surgical History:  Procedure Laterality Date  . ADENOIDECTOMY    . CARDIAC ELECTROPHYSIOLOGY STUDY AND ABLATION  2013  . ENDOVENOUS ABLATION SAPHENOUS VEIN W/ LASER Bilateral    Thermal ablation  . KNEE ARTHROSCOPY WITH MENISCAL REPAIR  Left 08/25/2016   Procedure: KNEE ARTHROSCOPY WITH partial medial meniscectomy, chondroplasty;  Surgeon: Melrose Nakayama, MD;  Location: [facility];  Service: Orthopedics;  Laterality: Left;  KNEE ARTHROSCOPY WITH partial medial meniscectomy, chondroplasty  . PALATE / UVULA BIOPSY / EXCISION    . TONSILLECTOMY  51 yrs old  . V-TACH ABLATION N/A 09/26/2012   Procedure: V-TACH ABLATION;  Surgeon: Evans Lance, MD;  Location: [facility] CATH LAB;  Service: Cardiovascular;  Laterality: N/A;  . WISDOM TOOTH EXTRACTION  51 yrs old   Social History   Social History  . Marital status: Legally Separated    Spouse name: N/A  . Number of children: N/A  . Years of education: N/A   Social History Main Topics  . Smoking status: Never Smoker  . Smokeless tobacco: Never Used  . Alcohol use Yes     Comment: two or three times a year  . Drug use: No  . Sexual activity: Yes    Partners: Male     Comment: lives with kids, significant other and step daughter. minimizing dairy and gluten, exercise   Other Topics Concern  . None   Social History Narrative  . None   Allergies  Allergen Reactions  . Sunflower Oil Swelling   Family History  Problem Relation Age of Onset  . Hypertension Mother   . Dementia Mother   . Dementia Father   . Hypertension Father   . Cataracts Father     bilateral  .  Alzheimer's disease Father   . Cancer Sister 8    metastatic  . Hypertension Brother   . ADD / ADHD Daughter   . ADD / ADHD Son   . Heart attack Maternal Grandfather     pacemaker  . Cancer Maternal Grandfather     ?  Marland Kitchen Cancer Paternal Grandmother     colon    Past medical history, social, surgical and family history all reviewed in electronic medical record.  No pertanent information unless stated regarding to the chief complaint.   Review of Systems: No headache, visual changes, nausea, vomiting, diarrhea, constipation, dizziness, abdominal pain, skin rash, fevers, chills, night  sweats, weight loss, swollen lymph nodes, body aches, joint swelling, muscle aches, chest pain, shortness of breath, mood changes.     Objective  Blood pressure (!) 150/90, pulse 73, resp. rate 16, weight 239 lb 8 oz (108.6 kg), SpO2 98 %.   Systems examined below as of 03/27/17 General: NAD A&O x3 mood, affect normal  HEENT: Pupils equal, extraocular movements intact no nystagmus Respiratory: not short of breath at rest or with speaking Cardiovascular: No lower extremity edema, non tender Skin: Warm dry intact with no signs of infection or rash on extremities or on axial skeleton. Abdomen: Soft nontender, no masses Neuro: Cranial nerves  intact, neurovascularly intact in all extremities with 2+ DTRs and 2+ pulses. Lymph: No lymphadenopathy appreciated today  Gait normal with good balance and coordination.  MSK: Non tender with full range of motion and good stability and symmetric strength and tone of shoulders,  wrist,  knee hips and ankles bilaterally.    Elbow: Right Unremarkable to inspection. Range of motion full pronation, supination, flexion, extension. Strength is full to all of the above directions Stable to varus, valgus stress. Negative moving valgus stress test. Tender to palpation over the lateral epicondylar region as well as the insertion of the tricep. Ulnar nerve does not sublux. Negative cubital tunnel Tinel's. Contralateral elbow unremarkable  Back Exam:  Inspection: poor posture increased kyphosis No change Motion: Flexion 45 deg, Extension 25 deg, Side Bending to 45 deg bilaterally,  Rotation to 45 deg bilaterally  SLR laying: Negative  XSLR laying: Negative  Palpable tenderness: Continued discomfort of the thoracolumbar juncture and the paraspinal musculature FABER: negative.  Sensory change: Gross sensation intact to all lumbar and sacral mild increasing tightness.  Reflexes: 2+ at both patellar tendons, 2+ at achilles tendons, Babinski's downgoing.    Strength at foot  Plantar-flexion: 5/5 Dorsi-flexion: 5/5 Eversion: 5/5 Inversion: 5/5  Leg strength  Quad: 5/5 Hamstring: 5/5 Hip flexor: 5/5 Hip abductors: 4+/5 improvement in strength but symmetric Gait unremarkable.   Osteopathic findings C4 flexed rotated and side bent left C6 flexed rotated and side bent left T3 extended rotated and side bent right inhaled third rib T10 extended rotated and side bent left L1 flexed rotated and side bent right Sacrum right on right '       Impression and Recommendations:     This case required medical decision making of moderate complexity.      Note: This dictation was prepared with Dragon dictation along with smaller phrase technology. Any transcriptional errors that result from this process are unintentional.

## 2017-03-27 ENCOUNTER — Ambulatory Visit (INDEPENDENT_AMBULATORY_CARE_PROVIDER_SITE_OTHER): Payer: 59 | Admitting: Family Medicine

## 2017-03-27 ENCOUNTER — Encounter: Payer: Self-pay | Admitting: Family Medicine

## 2017-03-27 VITALS — BP 150/90 | HR 73 | Resp 16 | Wt 239.5 lb

## 2017-03-27 DIAGNOSIS — M7711 Lateral epicondylitis, right elbow: Secondary | ICD-10-CM

## 2017-03-27 DIAGNOSIS — M999 Biomechanical lesion, unspecified: Secondary | ICD-10-CM | POA: Diagnosis not present

## 2017-03-27 DIAGNOSIS — G8929 Other chronic pain: Secondary | ICD-10-CM

## 2017-03-27 DIAGNOSIS — M546 Pain in thoracic spine: Secondary | ICD-10-CM | POA: Diagnosis not present

## 2017-03-27 MED ORDER — DICLOFENAC SODIUM 2 % TD SOLN: 2.0000 g | Freq: Two times a day (BID) | TRANSDERMAL | 3 refills | Status: DC

## 2017-03-27 NOTE — Patient Instructions (Signed)
good to see you  New exercises for the elbow pennsaid pinkie amount topically 2 times daily as needed.  Elbow compression sleeve can help with a lot of activity  Focus on lifting underhand or thumbs up  Ice 20 minutes 2 times daily. Usually after activity and before bed. See me again in 6-8 weeks.

## 2017-03-27 NOTE — Assessment & Plan Note (Signed)
Decision today to treat with OMT was based on Physical Exam  After verbal consent patient was treated with HVLA, ME, FPR techniques in  thoracic, lumbar and sacral areas  Patient tolerated the procedure well with improvement in symptoms  Patient given exercises, stretches and lifestyle modifications  See medications in patient instructions if given  Patient will follow up in 4-8 weeks 

## 2017-03-27 NOTE — Assessment & Plan Note (Signed)
Stable overall. Discussed icing regimen and home exercises. Discussed objective recently do a which ones to avoid. Encourage patient to increase activity as tolerated. Discussed the importance of core stability. Follow-up again in 4-8 weeks.

## 2017-03-27 NOTE — Assessment & Plan Note (Signed)
Lateral Epicondylitis: Elbow anatomy was reviewed, and tendinopathy was explained.  Pt. given a formal rehab program. Series of concentric and eccentric exercises should be done starting with no weight, work up to 1 lb, hammer, etc.  Use counterforce strap if working or using hands.  Formal PT would be beneficial. Emphasized stretching an cross-friction massage Emphasized proper palms up lifting biomechanics to unload ECRB RTC in 4 weeks  

## 2017-04-19 ENCOUNTER — Other Ambulatory Visit: Payer: Self-pay | Admitting: General Surgery

## 2017-04-19 DIAGNOSIS — Z1231 Encounter for screening mammogram for malignant neoplasm of breast: Secondary | ICD-10-CM

## 2017-04-19 DIAGNOSIS — N6452 Nipple discharge: Secondary | ICD-10-CM

## 2017-04-20 DIAGNOSIS — Z01419 Encounter for gynecological examination (general) (routine) without abnormal findings: Secondary | ICD-10-CM | POA: Diagnosis not present

## 2017-05-01 ENCOUNTER — Ambulatory Visit
Admission: RE | Admit: 2017-05-01 | Discharge: 2017-05-01 | Disposition: A | Discharge status: Home / Self Care | Payer: 59 | Source: Ambulatory Visit | Attending: General Surgery | Admitting: General Surgery

## 2017-05-01 ENCOUNTER — Other Ambulatory Visit (INDEPENDENT_AMBULATORY_CARE_PROVIDER_SITE_OTHER): Payer: 59

## 2017-05-01 ENCOUNTER — Encounter: Payer: Self-pay | Admitting: Family Medicine

## 2017-05-01 ENCOUNTER — Ambulatory Visit (INDEPENDENT_AMBULATORY_CARE_PROVIDER_SITE_OTHER): Payer: 59 | Admitting: Family Medicine

## 2017-05-01 VITALS — BP 136/80 | HR 62 | Temp 98.2°F | Resp 18 | Ht 68.0 in | Wt 236.8 lb

## 2017-05-01 DIAGNOSIS — E785 Hyperlipidemia, unspecified: Secondary | ICD-10-CM | POA: Diagnosis not present

## 2017-05-01 DIAGNOSIS — I10 Essential (primary) hypertension: Secondary | ICD-10-CM

## 2017-05-01 DIAGNOSIS — E559 Vitamin D deficiency, unspecified: Secondary | ICD-10-CM

## 2017-05-01 DIAGNOSIS — I480 Paroxysmal atrial fibrillation: Secondary | ICD-10-CM | POA: Diagnosis not present

## 2017-05-01 DIAGNOSIS — R739 Hyperglycemia, unspecified: Secondary | ICD-10-CM

## 2017-05-01 DIAGNOSIS — R928 Other abnormal and inconclusive findings on diagnostic imaging of breast: Secondary | ICD-10-CM | POA: Diagnosis not present

## 2017-05-01 DIAGNOSIS — I471 Supraventricular tachycardia: Secondary | ICD-10-CM | POA: Diagnosis not present

## 2017-05-01 DIAGNOSIS — D509 Iron deficiency anemia, unspecified: Secondary | ICD-10-CM | POA: Diagnosis not present

## 2017-05-01 DIAGNOSIS — E6609 Other obesity due to excess calories: Secondary | ICD-10-CM

## 2017-05-01 DIAGNOSIS — F339 Major depressive disorder, recurrent, unspecified: Secondary | ICD-10-CM

## 2017-05-01 DIAGNOSIS — R790 Abnormal level of blood mineral: Secondary | ICD-10-CM

## 2017-05-01 DIAGNOSIS — N6452 Nipple discharge: Secondary | ICD-10-CM

## 2017-05-01 LAB — CBC
HEMATOCRIT: 37.3 % (ref 36.0–46.0)
HEMOGLOBIN: 12.2 g/dL (ref 12.0–15.0)
MCHC: 32.9 g/dL (ref 30.0–36.0)
MCV: 88.5 fl (ref 78.0–100.0)
Platelets: 269 10*3/uL (ref 150.0–400.0)
RBC: 4.21 Mil/uL (ref 3.87–5.11)
RDW: 14.2 % (ref 11.5–15.5)
WBC: 6.9 10*3/uL (ref 4.0–10.5)

## 2017-05-01 LAB — HM MAMMOGRAPHY

## 2017-05-01 LAB — FERRITIN: Ferritin: 21.3 ng/mL (ref 10.0–291.0)

## 2017-05-01 MED ORDER — FLECAINIDE ACETATE 150 MG PO TABS: 300.0000 mg | ORAL_TABLET | ORAL | Status: DC | PRN

## 2017-05-01 MED ORDER — ERGOCALCIFEROL 1.25 MG (50000 UT) PO CAPS: 50000.0000 [IU] | ORAL_CAPSULE | ORAL | 1 refills | Status: DC

## 2017-05-01 MED ORDER — FLECAINIDE ACETATE 150 MG PO TABS: 300.0000 mg | ORAL_TABLET | ORAL | 0 refills | Status: DC | PRN

## 2017-05-01 NOTE — Patient Instructions (Signed)
DASH Eating Plan DASH stands for "Dietary Approaches to Stop Hypertension." The DASH eating plan is a healthy eating plan that has been shown to reduce high blood pressure (hypertension). It may also reduce your risk for type 2 diabetes, heart disease, and stroke. The DASH eating plan may also help with weight loss. What are tips for following this plan? General guidelines  Avoid eating more than 2,300 mg (milligrams) of salt (sodium) a day. If you have hypertension, you may need to reduce your sodium intake to 1,500 mg a day.  Limit alcohol intake to no more than 1 drink a day for nonpregnant women and 2 drinks a day for men. One drink equals 12 oz of beer, 5 oz of wine, or 1 oz of hard liquor.  Work with your health care provider to maintain a healthy body weight or to lose weight. Ask what an ideal weight is for you.  Get at least 30 minutes of exercise that causes your heart to beat faster (aerobic exercise) most days of the week. Activities may include walking, swimming, or biking.  Work with your health care provider or diet and nutrition specialist (dietitian) to adjust your eating plan to your individual calorie needs. Reading food labels  Check food labels for the amount of sodium per serving. Choose foods with less than 5 percent of the Daily Value of sodium. Generally, foods with less than 300 mg of sodium per serving fit into this eating plan.  To find whole grains, look for the word "whole" as the first word in the ingredient list. Shopping  Buy products labeled as "low-sodium" or "no salt added."  Buy fresh foods. Avoid canned foods and premade or frozen meals. Cooking  Avoid adding salt when cooking. Use salt-free seasonings or herbs instead of table salt or sea salt. Check with your health care provider or pharmacist before using salt substitutes.  Do not fry foods. Cook foods using healthy methods such as baking, boiling, grilling, and broiling instead.  Cook with  heart-healthy oils, such as olive, canola, soybean, or sunflower oil. Meal planning   Eat a balanced diet that includes: ? 5 or more servings of fruits and vegetables each day. At each meal, try to fill half of your plate with fruits and vegetables. ? Up to 6-8 servings of whole grains each day. ? Less than 6 oz of lean meat, poultry, or fish each day. A 3-oz serving of meat is about the same size as a deck of cards. One egg equals 1 oz. ? 2 servings of low-fat dairy each day. ? A serving of nuts, seeds, or beans 5 times each week. ? Heart-healthy fats. Healthy fats called Omega-3 fatty acids are found in foods such as flaxseeds and coldwater fish, like sardines, salmon, and mackerel.  Limit how much you eat of the following: ? Canned or prepackaged foods. ? Food that is high in trans fat, such as fried foods. ? Food that is high in saturated fat, such as fatty meat. ? Sweets, desserts, sugary drinks, and other foods with added sugar. ? Full-fat dairy products.  Do not salt foods before eating.  Try to eat at least 2 vegetarian meals each week.  Eat more home-cooked food and less restaurant, buffet, and fast food.  When eating at a restaurant, ask that your food be prepared with less salt or no salt, if possible. What foods are recommended? The items listed may not be a complete list. Talk with your dietitian about what   dietary choices are best for you. Grains Whole-grain or whole-wheat bread. Whole-grain or whole-wheat pasta. Brown rice. Oatmeal. Quinoa. Bulgur. Whole-grain and low-sodium cereals. Pita bread. Low-fat, low-sodium crackers. Whole-wheat flour tortillas. Vegetables Fresh or frozen vegetables (raw, steamed, roasted, or grilled). Low-sodium or reduced-sodium tomato and vegetable juice. Low-sodium or reduced-sodium tomato sauce and tomato paste. Low-sodium or reduced-sodium canned vegetables. Fruits All fresh, dried, or frozen fruit. Canned fruit in natural juice (without  added sugar). Meat and other protein foods Skinless chicken or turkey. Ground chicken or turkey. Pork with fat trimmed off. Fish and seafood. Egg whites. Dried beans, peas, or lentils. Unsalted nuts, nut butters, and seeds. Unsalted canned beans. Lean cuts of beef with fat trimmed off. Low-sodium, lean deli meat. Dairy Low-fat (1%) or fat-free (skim) milk. Fat-free, low-fat, or reduced-fat cheeses. Nonfat, low-sodium ricotta or cottage cheese. Low-fat or nonfat yogurt. Low-fat, low-sodium cheese. Fats and oils Soft margarine without trans fats. Vegetable oil. Low-fat, reduced-fat, or light mayonnaise and salad dressings (reduced-sodium). Canola, safflower, olive, soybean, and sunflower oils. Avocado. Seasoning and other foods Herbs. Spices. Seasoning mixes without salt. Unsalted popcorn and pretzels. Fat-free sweets. What foods are not recommended? The items listed may not be a complete list. Talk with your dietitian about what dietary choices are best for you. Grains Baked goods made with fat, such as croissants, muffins, or some breads. Dry pasta or rice meal packs. Vegetables Creamed or fried vegetables. Vegetables in a cheese sauce. Regular canned vegetables (not low-sodium or reduced-sodium). Regular canned tomato sauce and paste (not low-sodium or reduced-sodium). Regular tomato and vegetable juice (not low-sodium or reduced-sodium). Pickles. Olives. Fruits Canned fruit in a light or heavy syrup. Fried fruit. Fruit in cream or butter sauce. Meat and other protein foods Fatty cuts of meat. Ribs. Fried meat. Bacon. Sausage. Bologna and other processed lunch meats. Salami. Fatback. Hotdogs. Bratwurst. Salted nuts and seeds. Canned beans with added salt. Canned or smoked fish. Whole eggs or egg yolks. Chicken or turkey with skin. Dairy Whole or 2% milk, cream, and half-and-half. Whole or full-fat cream cheese. Whole-fat or sweetened yogurt. Full-fat cheese. Nondairy creamers. Whipped toppings.  Processed cheese and cheese spreads. Fats and oils Butter. Stick margarine. Lard. Shortening. Ghee. Bacon fat. Tropical oils, such as coconut, palm kernel, or palm oil. Seasoning and other foods Salted popcorn and pretzels. Onion salt, garlic salt, seasoned salt, table salt, and sea salt. Worcestershire sauce. Tartar sauce. Barbecue sauce. Teriyaki sauce. Soy sauce, including reduced-sodium. Steak sauce. Canned and packaged gravies. Fish sauce. Oyster sauce. Cocktail sauce. Horseradish that you find on the shelf. Ketchup. Mustard. Meat flavorings and tenderizers. Bouillon cubes. Hot sauce and Tabasco sauce. Premade or packaged marinades. Premade or packaged taco seasonings. Relishes. Regular salad dressings. Where to find more information:  National Heart, Lung, and Blood Institute: www.nhlbi.nih.gov  American Heart Association: www.heart.org Summary  The DASH eating plan is a healthy eating plan that has been shown to reduce high blood pressure (hypertension). It may also reduce your risk for type 2 diabetes, heart disease, and stroke.  With the DASH eating plan, you should limit salt (sodium) intake to 2,300 mg a day. If you have hypertension, you may need to reduce your sodium intake to 1,500 mg a day.  When on the DASH eating plan, aim to eat more fresh fruits and vegetables, whole grains, lean proteins, low-fat dairy, and heart-healthy fats.  Work with your health care provider or diet and nutrition specialist (dietitian) to adjust your eating plan to your individual   calorie needs. This information is not intended to replace advice given to you by your health care provider. Make sure you discuss any questions you have with your health care provider. Document Released: 10/26/2011 Document Revised: 10/30/2016 Document Reviewed: 10/30/2016 [employer] Interactive Patient Education  2017 [employer]  

## 2017-05-01 NOTE — Progress Notes (Signed)
Subjective:  I acted as a Education administrator for Dr. Charlett Blake. Misty Mahoney, [place]  Patient ID: Misty Mahoney, female    DOB: [birth year], 51 y.o.   MRN: [id number]  No chief complaint on file.   HPI  Patient is in today for a medication follow up. She is feeling well. No recent febrile illness or hospitalizations. No polyuria or polydipsia. Is trying to maintain a heart healthy diet. She continues to take her iron and vitamin D supplements. Denies CP/palp/SOB/HA/congestion/fevers/GI or GU c/o. Taking meds as prescribed. She has been mostly following a keto diet recently.   Patient Care Team: Mosie Lukes, MD as PCP - General (Family Medicine) Deboraha Sprang, MD as Consulting Physician (Cardiology) Thurman Coyer, DO as Consulting Physician (Sports Medicine) Erroll Luna, MD as Consulting Physician (General Surgery) Linus Mako, MD as Consulting Physician (Vascular Surgery) Allyn Kenner, DO as Consulting Physician (Obstetrics and Gynecology) [place], [name], [place] as Consulting Physician (Optometry)   Past Medical History:  Diagnosis Date  . Anemia    gestational  . Atrial fibrillation ([place])   . Chicken pox as a child  . Depression 06/15/2015  . DM, gestational, diet controlled 04/10/2014  . H/O gestational diabetes mellitus, not currently pregnant 04/10/2014  . History of chicken pox   . Hyperlipidemia 12/20/2015  . Hypertension   . Obesity, unspecified 04/10/2014  . OSA (obstructive sleep apnea)   . Preventative health care 12/26/2015  . SVT (supraventricular tachycardia) ([place])    s/p RFCA 09/26/12  . Vitamin D deficiency 12/26/2015    Past Surgical History:  Procedure Laterality Date  . ADENOIDECTOMY    . CARDIAC ELECTROPHYSIOLOGY STUDY AND ABLATION  2013  . ENDOVENOUS ABLATION SAPHENOUS VEIN W/ LASER Bilateral    Thermal ablation  . KNEE ARTHROSCOPY WITH MENISCAL REPAIR Left 08/25/2016   Procedure: KNEE ARTHROSCOPY WITH partial medial meniscectomy, chondroplasty;  Surgeon: Melrose Nakayama, MD;  Location: [facility];  Service: Orthopedics;  Laterality: Left;  KNEE ARTHROSCOPY WITH partial medial meniscectomy, chondroplasty  . PALATE / UVULA BIOPSY / EXCISION    . TONSILLECTOMY  51 yrs old  . V-TACH ABLATION N/A 09/26/2012   Procedure: V-TACH ABLATION;  Surgeon: Evans Lance, MD;  Location: [facility] CATH LAB;  Service: Cardiovascular;  Laterality: N/A;  . WISDOM TOOTH EXTRACTION  51 yrs old    Family History  Problem Relation Age of Onset  . Hypertension Mother   . Dementia Mother   . Dementia Father   . Hypertension Father   . Cataracts Father        bilateral  . Alzheimer's disease Father   . Cancer Sister 101       metastatic  . Hypertension Brother   . ADD / ADHD Daughter   . ADD / ADHD Son   . Heart attack Maternal Grandfather        pacemaker  . Cancer Maternal Grandfather        ?  Marland Kitchen Cancer Paternal Grandmother        colon    Social History   Social History  . Marital status: Legally Separated    Spouse name: N/A  . Number of children: N/A  . Years of education: N/A   Occupational History  . Not on file.   Social History Main Topics  . Smoking status: Never Smoker  . Smokeless tobacco: Never Used  . Alcohol use Yes     Comment: two or three times a year  . Drug  use: No  . Sexual activity: Yes    Partners: Male     Comment: lives with kids, significant other and step daughter. minimizing dairy and gluten, exercise   Other Topics Concern  . Not on file   Social History Narrative  . No narrative on file    Outpatient Medications Prior to Visit  Medication Sig Dispense Refill  . Cholecalciferol (VITAMIN D) 2000 units CAPS Take 1 capsule by mouth daily.    . Diclofenac Sodium (PENNSAID) 2 % SOLN Place 2 g onto the skin 2 (two) times daily. 112 g 3  . diltiazem (CARTIA XT) 180 MG 24 hr capsule Take 1 capsule (180 mg total) by mouth daily. 90 capsule 3  . escitalopram (LEXAPRO) 10 MG tablet Take 1 tablet (10 mg total) by  mouth daily. 90 tablet 1  . Ferrous Fumarate (HEMOCYTE) 324 (106 Fe) MG TABS tablet Take 1 tablet (106 mg of iron total) by mouth daily. 30 tablet 3  . Melatonin 5 MG CAPS Take 1 capsule by mouth at bedtime.    . Omega-3 Fatty Acids (FISH OIL PO) Take 1 capsule by mouth daily.    . valsartan-hydrochlorothiazide (DIOVAN-HCT) 320-12.5 MG tablet Take 1 tablet by mouth daily. 90 tablet 3  . ergocalciferol (VITAMIN D2) 50000 units capsule Take 1 capsule (50,000 Units total) by mouth once a week. 12 capsule 1   No facility-administered medications prior to visit.     Allergies  Allergen Reactions  . Sunflower Oil Swelling    Review of Systems  Constitutional: Positive for malaise/fatigue. Negative for fever.  HENT: Negative for congestion.   Eyes: Negative for blurred vision.  Respiratory: Negative for cough and shortness of breath.   Cardiovascular: Negative for chest pain, palpitations and leg swelling.  Gastrointestinal: Negative for vomiting.  Musculoskeletal: Negative for back pain.  Skin: Negative for rash.  Neurological: Negative for loss of consciousness and headaches.       Objective:    Physical Exam  Constitutional: She is oriented to person, place, and time. She appears well-developed and well-nourished. No distress.  HENT:  Head: Normocephalic and atraumatic.  Eyes: Conjunctivae are normal.  Neck: Normal range of motion. No thyromegaly present.  Cardiovascular: Normal rate and regular rhythm.   Pulmonary/Chest: Effort normal and breath sounds normal. She has no wheezes.  Abdominal: Soft. Bowel sounds are normal. There is no tenderness.  Musculoskeletal: Normal range of motion. She exhibits no edema or deformity.  Neurological: She is alert and oriented to person, place, and time.  Skin: Skin is warm and dry. She is not diaphoretic.  Psychiatric: She has a normal mood and affect.    BP 136/80   Pulse 62   Temp 98.2 F (36.8 C) (Oral)   Resp 18   Ht 5\' 8"  (1.727  m)   Wt 236 lb 12.8 oz (107.4 kg)   LMP 04/17/2017   SpO2 100%   BMI 36.01 kg/m  Wt Readings from Last 3 Encounters:  05/01/17 236 lb 12.8 oz (107.4 kg)  03/27/17 239 lb 8 oz (108.6 kg)  01/16/17 237 lb (107.5 kg)   BP Readings from Last 3 Encounters:  05/06/17 136/80  03/27/17 (!) 150/90  01/16/17 138/90     Immunization History  Administered Date(s) Administered  . Influenza,inj,Quad PF,36+ Mos 08/20/2015  . Influenza-Unspecified 08/20/2013, 07/31/2016  . Tdap 11/20/2005, 07/31/2016    Health Maintenance  Topic Date Due  . HIV Screening  06/09/1980  . COLONOSCOPY  06/10/2015  .  INFLUENZA VACCINE  06/20/2017  . PAP SMEAR  08/12/2018  . MAMMOGRAM  05/02/2019  . TETANUS/TDAP  07/31/2026    Lab Results  Component Value Date   WBC 6.9 05/01/2017   HGB 12.2 05/01/2017   HCT 37.3 05/01/2017   PLT 269.0 05/01/2017   GLUCOSE 100 (H) 11/07/2016   CHOL 178 11/07/2016   TRIG 74.0 11/07/2016   HDL 59.50 11/07/2016   LDLCALC 104 (H) 11/07/2016   ALT 15 11/07/2016   AST 15 11/07/2016   NA 139 11/07/2016   K 4.7 11/07/2016   CL 104 11/07/2016   CREATININE 0.61 11/07/2016   BUN 23 11/07/2016   CO2 30 11/07/2016   TSH 1.00 11/07/2016   HGBA1C 5.6 11/07/2016    Lab Results  Component Value Date   TSH 1.00 11/07/2016   Lab Results  Component Value Date   WBC 6.9 05/01/2017   HGB 12.2 05/01/2017   HCT 37.3 05/01/2017   MCV 88.5 05/01/2017   PLT 269.0 05/01/2017   Lab Results  Component Value Date   NA 139 11/07/2016   K 4.7 11/07/2016   CO2 30 11/07/2016   GLUCOSE 100 (H) 11/07/2016   BUN 23 11/07/2016   CREATININE 0.61 11/07/2016   BILITOT 0.4 11/07/2016   ALKPHOS 43 11/07/2016   AST 15 11/07/2016   ALT 15 11/07/2016   PROT 7.1 11/07/2016   ALBUMIN 4.0 11/07/2016   CALCIUM 9.0 11/07/2016   ANIONGAP 12 08/21/2016   GFR 109.72 11/07/2016   Lab Results  Component Value Date   CHOL 178 11/07/2016   Lab Results  Component Value Date   HDL 59.50  11/07/2016   Lab Results  Component Value Date   LDLCALC 104 (H) 11/07/2016   Lab Results  Component Value Date   TRIG 74.0 11/07/2016   Lab Results  Component Value Date   CHOLHDL 3 11/07/2016   Lab Results  Component Value Date   HGBA1C 5.6 11/07/2016         Assessment & Plan:   Problem List Items Addressed This Visit    SVT (supraventricular tachycardia) (HCC)   Relevant Medications   flecainide (TAMBOCOR) 150 MG tablet   Atrial fibrillation ([place]) - Primary   Relevant Medications   flecainide (TAMBOCOR) 150 MG tablet   Hypertension    Well controlled, no changes to meds. Encouraged heart healthy diet such as the DASH diet and exercise as tolerated.       Relevant Medications   flecainide (TAMBOCOR) 150 MG tablet   Other Relevant Orders   CBC   Comprehensive metabolic panel   TSH   Anemia    Resolved, can stop iron supplements presently.      Obesity    Encouraged DASH diet, decrease po intake and increase exercise as tolerated. Needs 7-8 hours of sleep nightly. Avoid trans fats, eat small, frequent meals every 4-5 hours with lean proteins, complex carbs and healthy fats. Minimize simple carbs, consider bariatric referral      Depression    Continue current meds, no new concerns      Hyperlipidemia    Encouraged heart healthy diet, increase exercise, avoid trans fats, consider a krill oil cap daily      Relevant Medications   flecainide (TAMBOCOR) 150 MG tablet   Other Relevant Orders   Lipid panel   Hyperglycemia    hgba1c acceptable, minimize simple carbs. Increase exercise as tolerated.       Relevant Orders   Hemoglobin A1c  Vitamin D deficiency    Continue a daily supplements      Relevant Orders   VITAMIN D 25 Hydroxy (Vit-D Deficiency, Fractures)      I am having Ms. Misty Mahoney maintain her diltiazem, valsartan-hydrochlorothiazide, Vitamin D, Omega-3 Fatty Acids (FISH OIL PO), Melatonin, escitalopram, Ferrous Fumarate, Diclofenac  Sodium, flecainide, and ergocalciferol.  Meds ordered this encounter  Medications  . DISCONTD: flecainide (TAMBOCOR) 150 MG tablet    Sig: Take 300 mg by mouth as needed.  Marland Kitchen DISCONTD: flecainide (TAMBOCOR) 150 MG tablet    Sig: Take 2 tablets (300 mg total) by mouth as needed.  . flecainide (TAMBOCOR) 150 MG tablet    Sig: Take 2 tablets (300 mg total) by mouth as needed.    Dispense:  6 tablet    Refill:  0  . ergocalciferol (VITAMIN D2) 50000 units capsule    Sig: Take 1 capsule (50,000 Units total) by mouth once a week.    Dispense:  12 capsule    Refill:  1    CMA served as scribe during this visit. History, Physical and Plan performed by medical provider. Documentation and orders reviewed and attested to.  Penni Homans, MD

## 2017-05-06 NOTE — Assessment & Plan Note (Signed)
Resolved, can stop iron supplements presently.

## 2017-05-06 NOTE — Assessment & Plan Note (Signed)
Encouraged heart healthy diet, increase exercise, avoid trans fats, consider a krill oil cap daily 

## 2017-05-06 NOTE — Assessment & Plan Note (Signed)
hgba1c acceptable, minimize simple carbs. Increase exercise as tolerated.  

## 2017-05-06 NOTE — Assessment & Plan Note (Signed)
Encouraged DASH diet, decrease po intake and increase exercise as tolerated. Needs 7-8 hours of sleep nightly. Avoid trans fats, eat small, frequent meals every 4-5 hours with lean proteins, complex carbs and healthy fats. Minimize simple carbs, consider bariatric referral 

## 2017-05-06 NOTE — Assessment & Plan Note (Signed)
Continue current meds, no new concerns

## 2017-05-06 NOTE — Assessment & Plan Note (Signed)
Well controlled, no changes to meds. Encouraged heart healthy diet such as the DASH diet and exercise as tolerated.  °

## 2017-05-06 NOTE — Assessment & Plan Note (Signed)
Continue a daily supplements

## 2017-05-14 MED FILL — CARTIA XT 180 MG CAPSULE SA: 180 | 90 days supply | Qty: 90 | Fill #2

## 2017-05-14 MED FILL — ESCITALOPRAM 10 MG TABLET: 10 | 30 days supply | Qty: 30 | Fill #2

## 2017-06-01 ENCOUNTER — Other Ambulatory Visit: Payer: Self-pay | Admitting: Family Medicine

## 2017-06-01 ENCOUNTER — Encounter: Payer: Self-pay | Admitting: Family Medicine

## 2017-06-01 DIAGNOSIS — R002 Palpitations: Secondary | ICD-10-CM

## 2017-06-05 ENCOUNTER — Ambulatory Visit (INDEPENDENT_AMBULATORY_CARE_PROVIDER_SITE_OTHER): Payer: 59 | Admitting: Family Medicine

## 2017-06-05 VITALS — BP 164/80 | HR 79 | Ht 68.0 in | Wt 242.0 lb

## 2017-06-05 DIAGNOSIS — G8929 Other chronic pain: Secondary | ICD-10-CM | POA: Diagnosis not present

## 2017-06-05 DIAGNOSIS — M999 Biomechanical lesion, unspecified: Secondary | ICD-10-CM | POA: Diagnosis not present

## 2017-06-05 DIAGNOSIS — M546 Pain in thoracic spine: Secondary | ICD-10-CM

## 2017-06-05 NOTE — Assessment & Plan Note (Signed)
Decision today to treat with OMT was based on Physical Exam  After verbal consent patient was treated with HVLA, ME, FPR techniques in cervical, thoracic, rib lumbar and sacral areas  Patient tolerated the procedure well with improvement in symptoms  Patient given exercises, stretches and lifestyle modifications  See medications in patient instructions if given  Patient will follow up in 6-8 weeks 

## 2017-06-05 NOTE — Assessment & Plan Note (Signed)
Stable all due to muscle imbalances Discussed icing HEP and ergonomics RTC in 6 weeks.

## 2017-06-05 NOTE — Progress Notes (Signed)
[facility] [address], [place] [address] Phone: [phone] Subjective:    I'm seeing this patient by the request  of:  Mosie Lukes, MD   CC: Back pain f/u   [id number]  Misty Mahoney is a 51 y.o. female coming in with complaint of back pain. Found to have more of a thoracic back pain that is likely secondary to poor posture. Responded fairly well to osteopathic manipulation 2 months ago. Patient was to do vitamin D supplementation, icing regimen, and home exercises. Continues to improve, feels like if she does the exercises make big difference. Mild aches and pains but nothing alarming.   .   did have previous x-rays done. Patient did have some very mild disc space narrowing but very minimal overall the thoracic and lumbar spine.  Past Medical History:  Diagnosis Date  . Anemia    gestational  . Atrial fibrillation ([place])   . Chicken pox as a child  . Depression 06/15/2015  . DM, gestational, diet controlled 04/10/2014  . H/O gestational diabetes mellitus, not currently pregnant 04/10/2014  . History of chicken pox   . Hyperlipidemia 12/20/2015  . Hypertension   . Obesity, unspecified 04/10/2014  . OSA (obstructive sleep apnea)   . Preventative health care 12/26/2015  . SVT (supraventricular tachycardia) ([place])    s/p RFCA 09/26/12  . Vitamin D deficiency 12/26/2015   Past Surgical History:  Procedure Laterality Date  . ADENOIDECTOMY    . CARDIAC ELECTROPHYSIOLOGY STUDY AND ABLATION  2013  . ENDOVENOUS ABLATION SAPHENOUS VEIN W/ LASER Bilateral    Thermal ablation  . KNEE ARTHROSCOPY WITH MENISCAL REPAIR Left 08/25/2016   Procedure: KNEE ARTHROSCOPY WITH partial medial meniscectomy, chondroplasty;  Surgeon: Melrose Nakayama, MD;  Location: [facility];  Service: Orthopedics;  Laterality: Left;  KNEE ARTHROSCOPY WITH partial medial meniscectomy, chondroplasty  . PALATE / UVULA BIOPSY / EXCISION    . TONSILLECTOMY  51 yrs  old  . V-TACH ABLATION N/A 09/26/2012   Procedure: V-TACH ABLATION;  Surgeon: Evans Lance, MD;  Location: [facility] CATH LAB;  Service: Cardiovascular;  Laterality: N/A;  . WISDOM TOOTH EXTRACTION  51 yrs old   Social History   Social History  . Marital status: Legally Separated    Spouse name: N/A  . Number of children: N/A  . Years of education: N/A   Social History Main Topics  . Smoking status: Never Smoker  . Smokeless tobacco: Never Used  . Alcohol use Yes     Comment: two or three times a year  . Drug use: No  . Sexual activity: Yes    Partners: Male     Comment: lives with kids, significant other and step daughter. minimizing dairy and gluten, exercise   Other Topics Concern  . Not on file   Social History Narrative  . No narrative on file   Allergies  Allergen Reactions  . Sunflower Oil Swelling   Family History  Problem Relation Age of Onset  . Hypertension Mother   . Dementia Mother   . Dementia Father   . Hypertension Father   . Cataracts Father        bilateral  . Alzheimer's disease Father   . Cancer Sister 14       metastatic  . Hypertension Brother   . ADD / ADHD Daughter   . ADD / ADHD Son   . Heart attack Maternal Grandfather  pacemaker  . Cancer Maternal Grandfather        ?  Marland Kitchen Cancer Paternal Grandmother        colon    Past medical history, social, surgical and family history all reviewed in electronic medical record.  No pertanent information unless stated regarding to the chief complaint.   Review of Systems: No headache, visual changes, nausea, vomiting, diarrhea, constipation, dizziness, abdominal pain, skin rash, fevers, chills, night sweats, weight loss, swollen lymph nodes, body aches, joint swelling, muscle aches, chest pain, shortness of breath, mood changes.    Objective  Blood pressure (!) 164/80, pulse 79, height 5\' 8"  (1.727 m), weight 242 lb (109.8 kg), SpO2 98 %.   Systems examined below as of 06/05/17 General: NAD  A&O x3 mood, affect normal  HEENT: Pupils equal, extraocular movements intact no nystagmus Respiratory: not short of breath at rest or with speaking Cardiovascular: No lower extremity edema, non tender Skin: Warm dry intact with no signs of infection or rash on extremities or on axial skeleton. Abdomen: Soft nontender, no masses Neuro: Cranial nerves  intact, neurovascularly intact in all extremities with 2+ DTRs and 2+ pulses. Lymph: No lymphadenopathy appreciated today  Gait normal with good balance and coordination.  MSK: Non tender with full range of motion and good stability and symmetric strength and tone of shoulders, elbows, wrist,  knee hips and ankles bilaterally.     Back Exam:  Inspection: continued mild increasing kyphosis of the upper thoracic spine Motion: Flexion 45 deg, Extension 25 deg, Side Bending to 25 deg bilaterally,  Rotation to 45 deg bilaterally  SLR laying: Negative  XSLR laying: Negative  Palpable tenderness:tender to palpation in the paraspinal musculature. FABER: negative. Sensory change: Gross sensation intact to all lumbar and sacral dermatomes.  Reflexes: 2+ at both patellar tendons, 2+ at achilles tendons, Babinski's downgoing.  Strength at foot  Plantar-flexion: 5/5 Dorsi-flexion: 5/5 Eversion: 5/5 Inversion: 5/5  Leg strength  Quad: 5/5 Hamstring: 5/5 Hip flexor: 5/5 Hip abductors: 4/5 but symmetric.  Gait unremarkable.  Osteopathic findings C2 flexed rotated and side bent right C4 flexed rotated and side bent left C7 flexed rotated and side bent left T3 extended rotated and side bent right inhaled third rib T6 extended rotated and side bent left L2 flexed rotated and side bent right Sacrum right on right     Impression and Recommendations:     This case required medical decision making of moderate complexity.      Note: This dictation was prepared with Dragon dictation along with smaller phrase technology. Any transcriptional errors  that result from this process are unintentional.

## 2017-06-05 NOTE — Patient Instructions (Signed)
Can't wait to meet Porfirio Mylar is your friend Keep it up  See me again in 2 months

## 2017-06-05 NOTE — Progress Notes (Signed)
Pre visit review using our clinic review tool, if applicable. No additional management support is needed unless otherwise documented below in the visit note. 

## 2017-06-07 ENCOUNTER — Ambulatory Visit: Payer: 59 | Admitting: Internal Medicine

## 2017-06-13 ENCOUNTER — Ambulatory Visit (INDEPENDENT_AMBULATORY_CARE_PROVIDER_SITE_OTHER): Payer: 59 | Admitting: Internal Medicine

## 2017-06-13 ENCOUNTER — Ambulatory Visit (HOSPITAL_BASED_OUTPATIENT_CLINIC_OR_DEPARTMENT_OTHER): Payer: 59

## 2017-06-13 ENCOUNTER — Encounter: Payer: Self-pay | Admitting: Internal Medicine

## 2017-06-13 VITALS — BP 142/80 | HR 72 | Ht 68.0 in | Wt 243.0 lb

## 2017-06-13 DIAGNOSIS — R0602 Shortness of breath: Secondary | ICD-10-CM | POA: Diagnosis not present

## 2017-06-13 DIAGNOSIS — I480 Paroxysmal atrial fibrillation: Secondary | ICD-10-CM | POA: Diagnosis not present

## 2017-06-13 MED ORDER — IRBESARTAN-HYDROCHLOROTHIAZIDE 300-12.5 MG PO TABS: 1.0000 | ORAL_TABLET | Freq: Every day | ORAL | 3 refills | Status: DC

## 2017-06-13 MED ORDER — FLECAINIDE ACETATE 150 MG PO TABS: 300.0000 mg | ORAL_TABLET | ORAL | 0 refills | Status: DC | PRN

## 2017-06-13 MED FILL — IRBESARTAN-HCTZ 300-12.5 MG: 300-12.5 | 90 days supply | Qty: 90 | Fill #0

## 2017-06-13 MED FILL — FLECAINIDE ACETATE 150 MG T: 150 | 3 days supply | Qty: 6 | Fill #0

## 2017-06-13 NOTE — Patient Instructions (Addendum)
Medication Instructions: - Your physician has recommended you make the following change in your medication:  1) Hold Cardizem (diltiazem) x 2 weeks to see how you feel 2) Stop valsartan/hct 3) Start irbesartan/hct 300/12.5 mg- take 1 tablet by mouth once daily  Labwork: - none ordered  Procedures/Testing: - Your physician has requested that you have an echocardiogram. Echocardiography is a painless test that uses sound waves to create images of your heart. It provides your doctor with information about the size and shape of your heart and how well your heart's chambers and valves are working. This procedure takes approximately one hour. There are no restrictions for this procedure.  - Your physician has recommended that you have a CT- Calcium Score ($150 out of pocket)  Follow-Up: - Your physician wants you to follow-up in: 6 months with Dr. Caryl Comes. You will receive a reminder letter in the mail two months in advance. If you don't receive a letter, please call our office to schedule the follow-up appointment.   Any Additional Special Instructions Will Be Listed Below (If Applicable).     If you need a refill on your cardiac medications before your next appointment, please call your pharmacy.

## 2017-06-13 NOTE — Progress Notes (Signed)
Patient Care Team: Mosie Lukes, MD as PCP - General (Family Medicine) Deboraha Sprang, MD as Consulting Physician (Cardiology) Thurman Coyer, DO as Consulting Physician (Sports Medicine) Erroll Luna, MD as Consulting Physician (General Surgery) Linus Mako, MD as Consulting Physician (Vascular Surgery) Allyn Kenner, DO as Consulting Physician (Obstetrics and Gynecology) [place], [name], [place] as Consulting Physician (Optometry)   HPI  Misty Mahoney is a 51 y.o. female seen in follow-up for paroxysmal atrial fibrillation identified a couple of years ago. This emerged following catheter ablation of AVNodal  Reentry  CHADS-VASc score was 1 with a CHADS-VASc score of 2.  At our last visit she declined anticoagulation.  She has had more atrial fibrillation now about 10 times over the last year. These have required flecainide cocktail with rapid resolution.  There's been a great deal of psychosocial stress with her son, death in her family and new job situation  Her biggest complaint is fatigue. She was started on an antidepressant after the death of her sister. It is not clear to her whether she is better or worse at this point having reduced her dose recently  She also has dyspnea on exertion which seems to be worsening. It is unassociated with chest pain nocturnal dyspnea or peripheral edema  She has treated sleep apnea.  Results Reviewed   Past Medical History:  Diagnosis Date  . Anemia    gestational  . Atrial fibrillation ([place])   . Chicken pox as a child  . Depression 06/15/2015  . DM, gestational, diet controlled 04/10/2014  . H/O gestational diabetes mellitus, not currently pregnant 04/10/2014  . History of chicken pox   . Hyperlipidemia 12/20/2015  . Hypertension   . Obesity, unspecified 04/10/2014  . OSA (obstructive sleep apnea)   . Preventative health care 12/26/2015  . SVT (supraventricular tachycardia) ([place])    s/p RFCA 09/26/12  . Vitamin D  deficiency 12/26/2015    Past Surgical History:  Procedure Laterality Date  . ADENOIDECTOMY    . CARDIAC ELECTROPHYSIOLOGY STUDY AND ABLATION  2013  . ENDOVENOUS ABLATION SAPHENOUS VEIN W/ LASER Bilateral    Thermal ablation  . KNEE ARTHROSCOPY WITH MENISCAL REPAIR Left 08/25/2016   Procedure: KNEE ARTHROSCOPY WITH partial medial meniscectomy, chondroplasty;  Surgeon: Melrose Nakayama, MD;  Location: [facility];  Service: Orthopedics;  Laterality: Left;  KNEE ARTHROSCOPY WITH partial medial meniscectomy, chondroplasty  . PALATE / UVULA BIOPSY / EXCISION    . TONSILLECTOMY  51 yrs old  . V-TACH ABLATION N/A 09/26/2012   Procedure: V-TACH ABLATION;  Surgeon: Evans Lance, MD;  Location: [facility] CATH LAB;  Service: Cardiovascular;  Laterality: N/A;  . WISDOM TOOTH EXTRACTION  51 yrs old    Current Outpatient Prescriptions  Medication Sig Dispense Refill  . Cholecalciferol (VITAMIN D) 2000 units CAPS Take 1 capsule by mouth daily.    . Diclofenac Sodium (PENNSAID) 2 % SOLN Place 2 g onto the skin 2 (two) times daily. 112 g 3  . diltiazem (CARTIA XT) 180 MG 24 hr capsule Take 1 capsule (180 mg total) by mouth daily. 90 capsule 3  . ergocalciferol (VITAMIN D2) 50000 units capsule Take 1 capsule (50,000 Units total) by mouth once a week. 12 capsule 1  . escitalopram (LEXAPRO) 10 MG tablet Take 1 tablet (10 mg total) by mouth daily. 90 tablet 1  . Ferrous Fumarate (HEMOCYTE) 324 (106 Fe) MG TABS tablet Take 1 tablet (106 mg of iron total) by  mouth daily. 30 tablet 3  . flecainide (TAMBOCOR) 150 MG tablet Take 2 tablets (300 mg total) by mouth as needed. 6 tablet 0  . Melatonin 5 MG CAPS Take 1 capsule by mouth at bedtime.    . Omega-3 Fatty Acids (FISH OIL PO) Take 1 capsule by mouth daily.    . irbesartan-hydrochlorothiazide (AVALIDE) 300-12.5 MG tablet Take 1 tablet by mouth daily. 90 tablet 3   No current facility-administered medications for this visit.     Allergies  Allergen  Reactions  . Sunflower Oil Swelling      Review of Systems negative except from HPI and PMH  Physical Exam BP (!) 142/80   Pulse 72   Ht 5\' 8"  (1.727 m)   Wt 243 lb (110.2 kg)   SpO2 96%   BMI 36.95 kg/m  Well developed and well nourished in no acute distress HENT normal E scleral and icterus clear Neck Supple JVP flat; carotids brisk and full Clear to ausculation  egular rate and rhythm, 2/6 systolic murmur  Soft with active bowel sounds No clubbing cyanosis  Edema Alert and oriented, grossly normal motor and sensory function Skin Warm and Dry  ECG today demonstrated sinus rhythm at  64 15/10/42 Otherwise normal  Assess ment and  Plan atrial fibrillation  Hypertension  Fatigue  Dyspnea on exertion    she continues with paroxysms of atrial fibrillation. Not withstanding her CHADS-VASc score 2 for reasons outlined previously i.e. her young age, I don't know if there is a significant benefit from anticoagulation. We discussed other treatment options including catheter ablation. She will consider this.   Given the increasing frequency of atrial fibrillation we will repeat an echo to look for structural changes that may be aggravating this tendency.   Given her dyspnea on exertion we'll also undertake a calcium score to exclude occult coronary disease  As relates to her fatigue I wonder whether her medications may be contributing to it. We will have her undertake a exclusion trial calcium blocker first. She will also continue to try to wean off of her antidepressant.  More than 50% of 45 min was spent in counseling related to the above    .

## 2017-06-19 ENCOUNTER — Telehealth (HOSPITAL_COMMUNITY): Payer: Self-pay | Admitting: Internal Medicine

## 2017-06-26 NOTE — Telephone Encounter (Signed)
User: Misty Mahoney A Date/time: 06/19/17 9:27 AM  Comment: Called pt and informing her that her appt time had to be changed due to the department having a staff meeting. Her appt time changed from 8:30 to 10:30am   Context:  Outcome: Left Message  Phone number: [phone] Phone Type: Home Phone  Comm. type: Telephone Call type: Outgoing  Contact: Lenell Antu

## 2017-07-02 DIAGNOSIS — G4733 Obstructive sleep apnea (adult) (pediatric): Secondary | ICD-10-CM | POA: Diagnosis not present

## 2017-07-10 ENCOUNTER — Other Ambulatory Visit: Payer: Self-pay

## 2017-07-10 ENCOUNTER — Telehealth: Payer: Self-pay | Admitting: Family Medicine

## 2017-07-10 ENCOUNTER — Ambulatory Visit (INDEPENDENT_AMBULATORY_CARE_PROVIDER_SITE_OTHER)
Admission: RE | Admit: 2017-07-10 | Discharge: 2017-07-10 | Disposition: A | Discharge status: Home / Self Care | Payer: Self-pay | Source: Ambulatory Visit | Attending: Internal Medicine | Admitting: Internal Medicine

## 2017-07-10 ENCOUNTER — Ambulatory Visit (HOSPITAL_COMMUNITY): Payer: 59 | Attending: Cardiovascular Disease

## 2017-07-10 DIAGNOSIS — R0600 Dyspnea, unspecified: Secondary | ICD-10-CM | POA: Insufficient documentation

## 2017-07-10 DIAGNOSIS — I471 Supraventricular tachycardia: Secondary | ICD-10-CM | POA: Insufficient documentation

## 2017-07-10 DIAGNOSIS — I480 Paroxysmal atrial fibrillation: Secondary | ICD-10-CM | POA: Insufficient documentation

## 2017-07-10 DIAGNOSIS — E785 Hyperlipidemia, unspecified: Secondary | ICD-10-CM | POA: Diagnosis not present

## 2017-07-10 DIAGNOSIS — G4733 Obstructive sleep apnea (adult) (pediatric): Secondary | ICD-10-CM | POA: Diagnosis not present

## 2017-07-10 DIAGNOSIS — R918 Other nonspecific abnormal finding of lung field: Secondary | ICD-10-CM

## 2017-07-10 DIAGNOSIS — R0602 Shortness of breath: Secondary | ICD-10-CM

## 2017-07-10 DIAGNOSIS — D649 Anemia, unspecified: Secondary | ICD-10-CM | POA: Diagnosis not present

## 2017-07-10 DIAGNOSIS — R911 Solitary pulmonary nodule: Secondary | ICD-10-CM

## 2017-07-10 DIAGNOSIS — I10 Essential (primary) hypertension: Secondary | ICD-10-CM | POA: Diagnosis not present

## 2017-07-10 MED FILL — ESCITALOPRAM 10 MG TABLET: 10 | 30 days supply | Qty: 30 | Fill #0

## 2017-07-10 NOTE — Telephone Encounter (Signed)
Refilled Lexapro for 30d+2/thx dmf

## 2017-07-18 ENCOUNTER — Telehealth: Payer: Self-pay | Admitting: Internal Medicine

## 2017-07-18 NOTE — Telephone Encounter (Signed)
I attempted to call the patient. I left a message that Dr. Caryl Comes has been out of the office since 07/06/17 and will not return until 8/31- therefore, any pending tests have not been reviewed by the doctor yet. I asked that she call back with any questions prior to MD reviewing her reports, but otherwise, I will call her back once reviewed.

## 2017-07-18 NOTE — Telephone Encounter (Signed)
New message    Pt is calling asking for a call back. She would like to talk about the results in her Mychart. She said she has some concerns. Please call.

## 2017-07-20 NOTE — Telephone Encounter (Signed)
I called the patient with her CT calcium score results and echo report.  She had 2 additional questions for Dr. Caryl Comes:  1) Her BP has been running about 140/80 on diltiazem 180 mg once daily & avalide 300/12.5 mg once daily. She is concerned with her diastolic dysfunction and fatigue that she may need tighter BP control.  2) There was a pulmonary nodule noted on her CT calcium score- not addressed by Dr. Caryl Comes when report was signed off- I advised the patient I would review with him to make sure no further CT of the chest if needed now. This was new to the patient.   She is aware I will call her back.

## 2017-07-24 NOTE — Telephone Encounter (Signed)
Repeat CT recommended for 6 month  My apologies for how this came forth

## 2017-07-24 NOTE — Telephone Encounter (Signed)
What about BP?

## 2017-07-27 NOTE — Telephone Encounter (Signed)
Misty Sprang, MD  Misty Filbert, RN Cc: Mosie Lukes, MD        And as relates to BP, not sure how much this would help, but its the easiest thing to try would be to use her dilt bid; she can also followup with her PCP about this  Thanks   Previous Messages    ----- Message -----  From: Mosie Lukes, MD  Sent: 07/24/2017  9:25 PM  To: Misty Sprang, MD   Absolutely, will have my assistant reach out to her to schedule follow up imaging.  Erline Levine  ----- Message -----  From: Misty Sprang, MD  Sent: 07/24/2017  4:34 PM  To: Shelbey Filbert, RN, Mosie Lukes, MD   [name] This lady's Ca score CT showed a pulm nodule  The recommendation is for repeat scanning in 6-12 months  Can you assume the responsibility for this  Thanks steve

## 2017-07-27 NOTE — Telephone Encounter (Signed)
Follow up    Pt is returning your call please leave detailed message on voicemail.

## 2017-07-27 NOTE — Telephone Encounter (Signed)
I spoke with the patient. She is aware that Dr. Caryl Comes and Dr. Charlett Blake have spoken in regards to her pulmonary nodule and that Dr. Charlett Blake will assume care of this- her office is to be in touch with her.  She is also aware in regards to he BP to try increasing dilitazem to 180 mg BID.  She is aware that she should follow up with Dr. Charlett Blake regarding her BP. She is unsure how much diltiazem she has, but will call us should she need a refill for the twice daily dosing.

## 2017-07-27 NOTE — Telephone Encounter (Signed)
I left a message for the patient to call. 

## 2017-07-30 NOTE — Telephone Encounter (Signed)
CT chest without contrast in 6 months for surveillance of pulmonary nodule

## 2017-07-30 NOTE — Telephone Encounter (Signed)
SB-Would you like to have the CT with and without? Plz advise/thx dmf

## 2017-07-30 NOTE — Telephone Encounter (Signed)
-----   Message from Mosie Lukes, MD sent at 07/29/2017 10:37 AM EDT ----- If she would like [place] to help with her BP she should come in for an RN BP check in next month and in the meantime, minimize caffeine and sodium. Also has she had her repeat CT scan ordered in 6-12 months to follow up on her pulmonary nodule. Thanks ----- Message ----- From: Deboraha Sprang, MD Sent: 07/27/2017   2:21 PM To: Charline Filbert, RN, Mosie Lukes, MD  And as relates to BP, not sure how much this would help, but its the easiest thing to try would be to use her dilt bid; she can also followup with her PCP about this Thanks  ----- Message ----- From: Mosie Lukes, MD Sent: 07/24/2017   9:25 PM To: Deboraha Sprang, MD   Absolutely, will have my assistant reach out to her to schedule follow up imaging.  Erline Levine ----- Message ----- From: Deboraha Sprang, MD Sent: 07/24/2017   4:34 PM To: Shalan Filbert, RN, Mosie Lukes, MD  [name]  This lady's Ca score CT showed a pulm nodule   The recommendation is for repeat scanning in 6-12 months Can you assume the responsibility for this  Thanks steve

## 2017-07-31 NOTE — Telephone Encounter (Signed)
Per SB CT chest without contrast ordered with Dx: R91.1 to monitor Pulmonary Nodule of 1.9cm to be done 6-12 months out/LMOVM stating for pt to schedule BP check with RN W/I the next month and that a repeat scan has been ordered for the next 6-12 month/asked that she RTC to go over all of this as SB did mention increasing Diltiazem bid/thx dmf

## 2017-08-02 NOTE — Telephone Encounter (Signed)
Per alt encounter pt is aware to schedule BP check and to increase Diltiazem/updated medication list/also states that she will advise if she needs a refill of this medication/thx dmf

## 2017-08-27 MED FILL — CARTIA XT 180 MG CAPSULE SA: 180 | 90 days supply | Qty: 90 | Fill #3

## 2017-08-27 MED FILL — ESCITALOPRAM 10 MG TABLET: 10 | 30 days supply | Qty: 30 | Fill #1

## 2017-09-14 MED FILL — IRBESARTAN-HCTZ 300-12.5 MG: 300-12.5 | 90 days supply | Qty: 90 | Fill #1

## 2017-10-02 DIAGNOSIS — G4733 Obstructive sleep apnea (adult) (pediatric): Secondary | ICD-10-CM | POA: Diagnosis not present

## 2017-10-30 MED FILL — ESCITALOPRAM 10 MG TABLET: 10 | 30 days supply | Qty: 30 | Fill #2

## 2017-11-27 ENCOUNTER — Encounter: Payer: Self-pay | Admitting: Internal Medicine

## 2017-11-27 ENCOUNTER — Ambulatory Visit: Payer: No Typology Code available for payment source | Admitting: Internal Medicine

## 2017-11-27 VITALS — BP 160/92 | HR 60 | Ht 68.0 in | Wt 247.2 lb

## 2017-11-27 DIAGNOSIS — I10 Essential (primary) hypertension: Secondary | ICD-10-CM

## 2017-11-27 DIAGNOSIS — I480 Paroxysmal atrial fibrillation: Secondary | ICD-10-CM

## 2017-11-27 DIAGNOSIS — R0602 Shortness of breath: Secondary | ICD-10-CM | POA: Diagnosis not present

## 2017-11-27 DIAGNOSIS — R5383 Other fatigue: Secondary | ICD-10-CM

## 2017-11-27 MED ORDER — FLECAINIDE ACETATE 150 MG PO TABS: 300.0000 mg | ORAL_TABLET | ORAL | 1 refills | Status: DC | PRN

## 2017-11-27 MED ORDER — DILTIAZEM HCL ER COATED BEADS 180 MG PO CP24: 180.0000 mg | ORAL_CAPSULE | Freq: Two times a day (BID) | ORAL | 3 refills | Status: DC

## 2017-11-27 MED FILL — FLECAINIDE ACETATE 150 MG T: 150 | 9 days supply | Qty: 18 | Fill #0

## 2017-11-27 MED FILL — CARTIA XT 180 MG CAPSULE SA: 180 | 90 days supply | Qty: 180 | Fill #0 | Status: TO

## 2017-11-27 NOTE — Patient Instructions (Signed)
Your physician recommends that you continue on your current medications as directed. Please refer to the Current Medication list given to you today.   Your physician wants you to follow-up in: YEAR WITH DR KLEIN  You will receive a reminder letter in the mail two months in advance. If you don't receive a letter, please call our office to schedule the follow-up appointment.  

## 2017-11-27 NOTE — Progress Notes (Signed)
Patient Care Team: Mosie Lukes, MD as PCP - General (Family Medicine) Deboraha Sprang, MD as Consulting Physician (Cardiology) Thurman Coyer, DO as Consulting Physician (Sports Medicine) Erroll Luna, MD as Consulting Physician (General Surgery) Linus Mako, MD as Consulting Physician (Vascular Surgery) Allyn Kenner, DO as Consulting Physician (Obstetrics and Gynecology) [place], [name], [place] as Consulting Physician (Optometry)   HPI  Misty Mahoney is a 52 y.o. female seen in follow-up for paroxysmal atrial fibrillation identified a couple of years ago. This emerged following catheter ablation of AVNodal  [place]  CHADS-VASc score was 1 with a CHADS-VASc score of 2.  At our last visit she declined anticoagulation.  She has had more atrial fibrillation now about 10 times over the last year. These have required flecainide cocktail with rapid resolution.  There's been a great deal of psychosocial stress with her son, death in her family and new job situation  Her biggest complaint is fatigue. She was started on an antidepressant after the death of her sister.   She has had a few episodes of atrial fibrillation.  She takes flecainide with rapid restoration of sinus rhythm.  These episodes have been associated with stimulants.  She has treated sleep apnea. DATE TEST    8/18 Echo    EF 55-60 %  LAD 36 mL/m2  8/1/8 CTC   Ca score 0          Results Reviewed   Past Medical History:  Diagnosis Date  . Anemia    gestational  . Atrial fibrillation ([place])   . Chicken pox as a child  . Depression 06/15/2015  . DM, gestational, diet controlled 04/10/2014  . H/O gestational diabetes mellitus, not currently pregnant 04/10/2014  . History of chicken pox   . Hyperlipidemia 12/20/2015  . Hypertension   . Obesity, unspecified 04/10/2014  . OSA (obstructive sleep apnea)   . Preventative health care 12/26/2015  . SVT (supraventricular tachycardia) ([place])    s/p RFCA  09/26/12  . Vitamin D deficiency 12/26/2015    Past Surgical History:  Procedure Laterality Date  . ADENOIDECTOMY    . CARDIAC ELECTROPHYSIOLOGY STUDY AND ABLATION  2013  . ENDOVENOUS ABLATION SAPHENOUS VEIN W/ LASER Bilateral    Thermal ablation  . KNEE ARTHROSCOPY WITH MENISCAL REPAIR Left 08/25/2016   Procedure: KNEE ARTHROSCOPY WITH partial medial meniscectomy, chondroplasty;  Surgeon: Melrose Nakayama, MD;  Location: [facility];  Service: Orthopedics;  Laterality: Left;  KNEE ARTHROSCOPY WITH partial medial meniscectomy, chondroplasty  . PALATE / UVULA BIOPSY / EXCISION    . TONSILLECTOMY  52 yrs old  . V-TACH ABLATION N/A 09/26/2012   Procedure: V-TACH ABLATION;  Surgeon: Evans Lance, MD;  Location: [facility] CATH LAB;  Service: Cardiovascular;  Laterality: N/A;  . WISDOM TOOTH EXTRACTION  52 yrs old    Current Outpatient Medications  Medication Sig Dispense Refill  . Cholecalciferol (VITAMIN D) 2000 units CAPS Take 1 capsule by mouth daily.    Marland Kitchen diltiazem (CARDIZEM CD) 180 MG 24 hr capsule Take 1 capsule (180 mg total) by mouth 2 (two) times daily. 90 capsule 3  . ergocalciferol (VITAMIN D2) 50000 units capsule Take 1 capsule (50,000 Units total) by mouth once a week. 12 capsule 1  . escitalopram (LEXAPRO) 10 MG tablet Take 1 tablet (10 mg total) by mouth daily. 90 tablet 1  . escitalopram (LEXAPRO) 10 MG tablet TAKE 1 TABLET (10 MG TOTAL) BY MOUTH DAILY. 30 tablet 2  .  Ferrous Fumarate (HEMOCYTE) 324 (106 Fe) MG TABS tablet Take 1 tablet (106 mg of iron total) by mouth daily. 30 tablet 3  . flecainide (TAMBOCOR) 150 MG tablet Take 2 tablets (300 mg total) by mouth as needed. 6 tablet 0  . irbesartan-hydrochlorothiazide (AVALIDE) 300-12.5 MG tablet Take 1 tablet by mouth daily. 90 tablet 3  . Omega-3 Fatty Acids (FISH OIL PO) Take 1 capsule by mouth daily.     No current facility-administered medications for this visit.     Allergies  Allergen Reactions  . Sunflower Oil  Swelling      Review of Systems negative except from HPI and PMH  Physical Exam BP (!) 160/92 Comment: PATIENT FEELS FINE, HAS A LOT GOIN ON  Pulse 60   Ht 5\' 8"  (1.727 m)   Wt 247 lb 4 oz (112.2 kg)   LMP 04/17/2017   SpO2 97%   BMI 37.59 kg/m  Well developed and nourished in no acute distress HENT normal Neck supple with JVP-flat Clear Regular rate and rhythm, 2/6 systolic murmur right upper sternal border  abd-soft with active BS No Clubbing cyanosis tredema Skin-warm and dry A & Oriented  Grossly normal sensory and motor function     Assess ment and  Plan Atrial fibrillation  Hypertension  Fatigue  Dyspnea on exertion  She continues with paroxysms of atrial fibrillation that seem to be related to stimulants.  She will work on reducing the stimulants.  We will continue her on as needed flecainide.  Her blood pressure is elevated.  We will increase her diltiazem from 180 daily--twice daily.  We will have to keep track of her heart rate.  She will also try to see whether it aggravates her fatigue.    More than 50% of 25  min was spent in counseling related to the above    .

## 2017-12-06 ENCOUNTER — Inpatient Hospital Stay: Admission: RE | Admit: 2017-12-06 | Payer: No Typology Code available for payment source | Source: Ambulatory Visit

## 2017-12-10 ENCOUNTER — Ambulatory Visit (INDEPENDENT_AMBULATORY_CARE_PROVIDER_SITE_OTHER)
Admission: RE | Admit: 2017-12-10 | Discharge: 2017-12-10 | Disposition: A | Discharge status: Home / Self Care | Payer: No Typology Code available for payment source | Source: Ambulatory Visit | Attending: Family Medicine | Admitting: Family Medicine

## 2017-12-10 DIAGNOSIS — R918 Other nonspecific abnormal finding of lung field: Secondary | ICD-10-CM | POA: Diagnosis not present

## 2017-12-10 DIAGNOSIS — R911 Solitary pulmonary nodule: Secondary | ICD-10-CM

## 2017-12-13 ENCOUNTER — Encounter: Payer: Self-pay | Admitting: Family Medicine

## 2017-12-13 ENCOUNTER — Telehealth: Payer: Self-pay | Admitting: Family Medicine

## 2017-12-13 ENCOUNTER — Ambulatory Visit (INDEPENDENT_AMBULATORY_CARE_PROVIDER_SITE_OTHER): Payer: No Typology Code available for payment source | Admitting: Family Medicine

## 2017-12-13 DIAGNOSIS — D3500 Benign neoplasm of unspecified adrenal gland: Secondary | ICD-10-CM | POA: Insufficient documentation

## 2017-12-13 DIAGNOSIS — I10 Essential (primary) hypertension: Secondary | ICD-10-CM | POA: Diagnosis not present

## 2017-12-13 DIAGNOSIS — I480 Paroxysmal atrial fibrillation: Secondary | ICD-10-CM

## 2017-12-13 DIAGNOSIS — I729 Aneurysm of unspecified site: Secondary | ICD-10-CM | POA: Diagnosis not present

## 2017-12-13 DIAGNOSIS — D3501 Benign neoplasm of right adrenal gland: Secondary | ICD-10-CM

## 2017-12-13 DIAGNOSIS — E785 Hyperlipidemia, unspecified: Secondary | ICD-10-CM | POA: Diagnosis not present

## 2017-12-13 DIAGNOSIS — E6609 Other obesity due to excess calories: Secondary | ICD-10-CM | POA: Diagnosis not present

## 2017-12-13 DIAGNOSIS — R739 Hyperglycemia, unspecified: Secondary | ICD-10-CM | POA: Diagnosis not present

## 2017-12-13 DIAGNOSIS — E559 Vitamin D deficiency, unspecified: Secondary | ICD-10-CM | POA: Diagnosis not present

## 2017-12-13 LAB — CBC
HEMATOCRIT: 35.2 % — AB (ref 36.0–46.0)
HEMOGLOBIN: 11.5 g/dL — AB (ref 12.0–15.0)
MCHC: 32.7 g/dL (ref 30.0–36.0)
MCV: 87.2 fl (ref 78.0–100.0)
Platelets: 271 10*3/uL (ref 150.0–400.0)
RBC: 4.04 Mil/uL (ref 3.87–5.11)
RDW: 14.3 % (ref 11.5–15.5)
WBC: 6.3 10*3/uL (ref 4.0–10.5)

## 2017-12-13 LAB — COMPREHENSIVE METABOLIC PANEL
ALBUMIN: 4 g/dL (ref 3.5–5.2)
ALK PHOS: 46 U/L (ref 39–117)
ALT: 15 U/L (ref 0–35)
AST: 15 U/L (ref 0–37)
BUN: 22 mg/dL (ref 6–23)
CO2: 28 mEq/L (ref 19–32)
Calcium: 9.2 mg/dL (ref 8.4–10.5)
Chloride: 104 mEq/L (ref 96–112)
Creatinine, Ser: 0.54 mg/dL (ref 0.40–1.20)
GFR: 125.75 mL/min (ref >60.00)
GLUCOSE: 95 mg/dL (ref 70–99)
POTASSIUM: 3.8 meq/L (ref 3.5–5.1)
Sodium: 140 mEq/L (ref 135–145)
TOTAL PROTEIN: 7.4 g/dL (ref 6.0–8.3)
Total Bilirubin: 0.5 mg/dL (ref 0.2–1.2)

## 2017-12-13 LAB — HEMOGLOBIN A1C: Hgb A1c MFr Bld: 6.2 % (ref 4.6–6.5)

## 2017-12-13 LAB — LIPID PANEL
CHOLESTEROL: 158 mg/dL (ref 0–200)
HDL: 58.5 mg/dL (ref >39.00)
LDL Cholesterol: 86 mg/dL (ref 0–99)
NonHDL: 99.21
Total CHOL/HDL Ratio: 3
Triglycerides: 67 mg/dL (ref 0.0–149.0)
VLDL: 13.4 mg/dL (ref 0.0–40.0)

## 2017-12-13 LAB — TSH: TSH: 1.19 u[IU]/mL (ref 0.35–4.50)

## 2017-12-13 LAB — VITAMIN D 25 HYDROXY (VIT D DEFICIENCY, FRACTURES): VITD: 23.15 ng/mL — ABNORMAL LOW (ref 30.00–100.00)

## 2017-12-13 LAB — CORTISOL: Cortisol, Plasma: 4.2 ug/dL

## 2017-12-13 MED ORDER — IRBESARTAN-HYDROCHLOROTHIAZIDE 300-12.5 MG PO TABS: 1.0000 | ORAL_TABLET | Freq: Every day | ORAL | 3 refills | Status: DC

## 2017-12-13 MED ORDER — ESCITALOPRAM OXALATE 10 MG PO TABS: 10.0000 mg | ORAL_TABLET | Freq: Every day | ORAL | 2 refills | Status: DC

## 2017-12-13 MED ORDER — TRIAMTERENE-HCTZ 37.5-25 MG PO TABS: 1.0000 | ORAL_TABLET | Freq: Every day | ORAL | 1 refills | Status: DC

## 2017-12-13 MED ORDER — FERROUS FUMARATE 324 (106 FE) MG PO TABS: 1.0000 | ORAL_TABLET | Freq: Every day | ORAL | 3 refills | Status: DC

## 2017-12-13 MED ORDER — IRBESARTAN 300 MG PO TABS: 300.0000 mg | ORAL_TABLET | Freq: Every day | ORAL | 1 refills | Status: DC

## 2017-12-13 MED ORDER — ERGOCALCIFEROL 1.25 MG (50000 UT) PO CAPS: 50000.0000 [IU] | ORAL_CAPSULE | ORAL | 1 refills | Status: DC

## 2017-12-13 MED FILL — FERROCITE TABLET: 324 | 30 days supply | Qty: 30 | Fill #0

## 2017-12-13 MED FILL — ESCITALOPRAM 10 MG TABLET: 10 | 30 days supply | Qty: 30 | Fill #0

## 2017-12-13 MED FILL — TRIAMTERENE/HCTZ 37.5/25 TB: 37.5-25 | 90 days supply | Qty: 90 | Fill #0 | Status: TO

## 2017-12-13 MED FILL — VIT D2 1.25 MG (50,000 UNIT: 1.25 MG | 84 days supply | Qty: 12 | Fill #0 | Status: TO

## 2017-12-13 NOTE — Assessment & Plan Note (Signed)
Right side 2.1 cm. Proceed with rule out Pheochromocytoma due to elevated blood pressure

## 2017-12-13 NOTE — Assessment & Plan Note (Signed)
hgba1c acceptable, minimize simple carbs. Increase exercise as tolerated.  

## 2017-12-13 NOTE — Telephone Encounter (Signed)
Pharmacy called, she would like update on this. Please call back

## 2017-12-13 NOTE — Telephone Encounter (Signed)
'  Manuela Schwartz' from [facility] called for clarification: 2 prescriptions were sent for pt ...one for Irbesartan -HCTZ 300-12.5mg   and one for Irbesartan 300 mg. It appears the Irbesartan-HCTZ was discontinued. Please clarify, would like telephone call:  'Susan' [phone].

## 2017-12-13 NOTE — Assessment & Plan Note (Signed)
Encouraged heart healthy diet, increase exercise, avoid trans fats, consider a krill oil cap daily 

## 2017-12-13 NOTE — Assessment & Plan Note (Signed)
Still running high even with Cardizem being increased by cardiology to bid

## 2017-12-13 NOTE — Progress Notes (Signed)
Subjective:  I acted as a Education administrator for Dr. Charlett Blake. [place], [place]  Patient ID: Misty Mahoney, female    DOB: [birth year], 52 y.o.   MRN: [id number]  No chief complaint on file.   HPI  Patient is in today for a follow up and continues to struggle with fatigue. Has had some episodes of palpitations, SVT and afib since her last visit. Has been following with Dr Caryl Comes of cardiology. Flecainide has been helpful. She notes stress on the job and is considering changing roles. Denies CP/SOB/HA/congestion/fevers/GI or GU c/o. Taking meds as prescribed  Patient Care Team: Mosie Lukes, MD as PCP - General (Family Medicine) Deboraha Sprang, MD as Consulting Physician (Cardiology) Thurman Coyer, DO as Consulting Physician (Sports Medicine) Erroll Luna, MD as Consulting Physician (General Surgery) Linus Mako, MD as Consulting Physician (Vascular Surgery) Allyn Kenner, DO as Consulting Physician (Obstetrics and Gynecology) [place], [name], [place] as Consulting Physician (Optometry)   Past Medical History:  Diagnosis Date  . Anemia    gestational  . Atrial fibrillation ([place])   . Chicken pox as a child  . Depression 06/15/2015  . DM, gestational, diet controlled 04/10/2014  . H/O gestational diabetes mellitus, not currently pregnant 04/10/2014  . History of chicken pox   . Hyperlipidemia 12/20/2015  . Hypertension   . Obesity, unspecified 04/10/2014  . OSA (obstructive sleep apnea)   . Preventative health care 12/26/2015  . SVT (supraventricular tachycardia) ([place])    s/p RFCA 09/26/12  . Vitamin D deficiency 12/26/2015    Past Surgical History:  Procedure Laterality Date  . ADENOIDECTOMY    . CARDIAC ELECTROPHYSIOLOGY STUDY AND ABLATION  2013  . ENDOVENOUS ABLATION SAPHENOUS VEIN W/ LASER Bilateral    Thermal ablation  . KNEE ARTHROSCOPY WITH MENISCAL REPAIR Left 08/25/2016   Procedure: KNEE ARTHROSCOPY WITH partial medial meniscectomy, chondroplasty;  Surgeon: Melrose Nakayama, MD;   Location: [facility];  Service: Orthopedics;  Laterality: Left;  KNEE ARTHROSCOPY WITH partial medial meniscectomy, chondroplasty  . PALATE / UVULA BIOPSY / EXCISION    . TONSILLECTOMY  52 yrs old  . V-TACH ABLATION N/A 09/26/2012   Procedure: V-TACH ABLATION;  Surgeon: Evans Lance, MD;  Location: [facility] CATH LAB;  Service: Cardiovascular;  Laterality: N/A;  . WISDOM TOOTH EXTRACTION  52 yrs old    Family History  Problem Relation Age of Onset  . Hypertension Mother   . Dementia Mother   . Dementia Father   . Hypertension Father   . Cataracts Father        bilateral  . Alzheimer's disease Father   . Cancer Sister 54       metastatic  . Hypertension Brother   . ADD / ADHD Daughter   . ADD / ADHD Son   . Heart attack Maternal Grandfather        pacemaker  . Cancer Maternal Grandfather        ?  Marland Kitchen Cancer Paternal Grandmother        colon    Social History   Socioeconomic History  . Marital status: Legally Separated    Spouse name: Not on file  . Number of children: Not on file  . Years of education: Not on file  . Highest education level: Not on file  Social Needs  . Financial resource strain: Not on file  . Food insecurity - worry: Not on file  . Food insecurity - inability: Not on file  . Transportation needs -  medical: Not on file  . Transportation needs - non-medical: Not on file  Occupational History  . Not on file  Tobacco Use  . Smoking status: Never Smoker  . Smokeless tobacco: Never Used  Substance and Sexual Activity  . Alcohol use: Yes    Comment: two or three times a year  . Drug use: No  . Sexual activity: Yes    Partners: Male    Comment: lives with kids, significant other and step daughter. minimizing dairy and gluten, exercise  Other Topics Concern  . Not on file  Social History Narrative  . Not on file    Outpatient Medications Prior to Visit  Medication Sig Dispense Refill  . Cholecalciferol (VITAMIN D) 2000 units CAPS Take 1  capsule by mouth daily.    Marland Kitchen diltiazem (CARDIZEM CD) 180 MG 24 hr capsule Take 1 capsule (180 mg total) by mouth 2 (two) times daily. 180 capsule 3  . ergocalciferol (VITAMIN D2) 50000 units capsule Take 1 capsule (50,000 Units total) by mouth once a week. 12 capsule 1  . escitalopram (LEXAPRO) 10 MG tablet Take 1 tablet (10 mg total) by mouth daily. 90 tablet 1  . escitalopram (LEXAPRO) 10 MG tablet TAKE 1 TABLET (10 MG TOTAL) BY MOUTH DAILY. 30 tablet 2  . Ferrous Fumarate (HEMOCYTE) 324 (106 Fe) MG TABS tablet Take 1 tablet (106 mg of iron total) by mouth daily. 30 tablet 3  . flecainide (TAMBOCOR) 150 MG tablet Take 2 tablets (300 mg total) by mouth as needed. 18 tablet 1  . irbesartan-hydrochlorothiazide (AVALIDE) 300-12.5 MG tablet Take 1 tablet by mouth daily. 90 tablet 3  . Omega-3 Fatty Acids (FISH OIL PO) Take 1 capsule by mouth daily.     No facility-administered medications prior to visit.     Allergies  Allergen Reactions  . Sunflower Oil Swelling    Review of Systems  Constitutional: Positive for malaise/fatigue. Negative for fever.  HENT: Negative for congestion.   Eyes: Negative for blurred vision.  Respiratory: Negative for shortness of breath.   Cardiovascular: Positive for palpitations. Negative for chest pain and leg swelling.  Gastrointestinal: Negative for abdominal pain, blood in stool and nausea.  Genitourinary: Negative for dysuria and frequency.  Musculoskeletal: Negative for falls.  Skin: Negative for rash.  Neurological: Negative for dizziness, loss of consciousness and headaches.  Endo/Heme/Allergies: Negative for environmental allergies.  Psychiatric/Behavioral: Positive for depression. The patient is not nervous/anxious.        Objective:    Physical Exam  Constitutional: She is oriented to person, place, and time. She appears well-developed and well-nourished. No distress.  HENT:  Head: Normocephalic and atraumatic.  Nose: Nose normal.  Eyes:  Right eye exhibits no discharge. Left eye exhibits no discharge.  Neck: Normal range of motion. Neck supple.  Cardiovascular: Normal rate and regular rhythm.  No murmur heard. Pulmonary/Chest: Effort normal and breath sounds normal.  Abdominal: Soft. Bowel sounds are normal. There is no tenderness.  Musculoskeletal: She exhibits no edema.  Neurological: She is alert and oriented to person, place, and time.  Skin: Skin is warm and dry.  Psychiatric: She has a normal mood and affect.  Nursing note and vitals reviewed.   LMP 04/17/2017  Wt Readings from Last 3 Encounters:  11/27/17 247 lb 4 oz (112.2 kg)  06/13/17 243 lb (110.2 kg)  06/05/17 242 lb (109.8 kg)   BP Readings from Last 3 Encounters:  11/27/17 (!) 160/92  06/13/17 (!) 142/80  06/05/17 Marland Kitchen)  164/80     Immunization History  Administered Date(s) Administered  . Influenza,inj,Quad PF,6+ Mos 08/20/2015  . Influenza-Unspecified 08/20/2013, 07/31/2016  . Tdap 11/20/2005, 07/31/2016    Health Maintenance  Topic Date Due  . HIV Screening  06/09/1980  . COLONOSCOPY  06/10/2015  . PAP SMEAR  08/12/2018  . MAMMOGRAM  05/02/2019  . TETANUS/TDAP  07/31/2026  . INFLUENZA VACCINE  Completed    Lab Results  Component Value Date   WBC 6.9 05/01/2017   HGB 12.2 05/01/2017   HCT 37.3 05/01/2017   PLT 269.0 05/01/2017   GLUCOSE 100 (H) 11/07/2016   CHOL 178 11/07/2016   TRIG 74.0 11/07/2016   HDL 59.50 11/07/2016   LDLCALC 104 (H) 11/07/2016   ALT 15 11/07/2016   AST 15 11/07/2016   NA 139 11/07/2016   K 4.7 11/07/2016   CL 104 11/07/2016   CREATININE 0.61 11/07/2016   BUN 23 11/07/2016   CO2 30 11/07/2016   TSH 1.00 11/07/2016   HGBA1C 5.6 11/07/2016    Lab Results  Component Value Date   TSH 1.00 11/07/2016   Lab Results  Component Value Date   WBC 6.9 05/01/2017   HGB 12.2 05/01/2017   HCT 37.3 05/01/2017   MCV 88.5 05/01/2017   PLT 269.0 05/01/2017   Lab Results  Component Value Date   NA 139  11/07/2016   K 4.7 11/07/2016   CO2 30 11/07/2016   GLUCOSE 100 (H) 11/07/2016   BUN 23 11/07/2016   CREATININE 0.61 11/07/2016   BILITOT 0.4 11/07/2016   ALKPHOS 43 11/07/2016   AST 15 11/07/2016   ALT 15 11/07/2016   PROT 7.1 11/07/2016   ALBUMIN 4.0 11/07/2016   CALCIUM 9.0 11/07/2016   ANIONGAP 12 08/21/2016   GFR 109.72 11/07/2016   Lab Results  Component Value Date   CHOL 178 11/07/2016   Lab Results  Component Value Date   HDL 59.50 11/07/2016   Lab Results  Component Value Date   LDLCALC 104 (H) 11/07/2016   Lab Results  Component Value Date   TRIG 74.0 11/07/2016   Lab Results  Component Value Date   CHOLHDL 3 11/07/2016   Lab Results  Component Value Date   HGBA1C 5.6 11/07/2016         Assessment & Plan:   Problem List Items Addressed This Visit    None      I am having Norva L. Chrissie Noa maintain her Vitamin D, Omega-3 Fatty Acids (FISH OIL PO), escitalopram, Ferrous Fumarate, ergocalciferol, irbesartan-hydrochlorothiazide, escitalopram, diltiazem, and flecainide.  No orders of the defined types were placed in this encounter.   CMA served as Education administrator during this visit. History, Physical and Plan performed by medical provider. Documentation and orders reviewed and attested to.  Magdalene Molly, [place]

## 2017-12-13 NOTE — Assessment & Plan Note (Signed)
Dr Caryl Comes is following and is intermittent. Converts with Flecainide.

## 2017-12-13 NOTE — Patient Instructions (Addendum)
colonoscopy  Hypertension Hypertension is another name for high blood pressure. High blood pressure forces your heart to work harder to pump blood. This can cause problems over time. There are two numbers in a blood pressure reading. There is a top number (systolic) over a bottom number (diastolic). It is best to have a blood pressure below 120/80. Healthy choices can help lower your blood pressure. You may need medicine to help lower your blood pressure if:  Your blood pressure cannot be lowered with healthy choices.  Your blood pressure is higher than 130/80.  Follow these instructions at home: Eating and drinking  If directed, follow the DASH eating plan. This diet includes: ? Filling half of your plate at each meal with fruits and vegetables. ? Filling one quarter of your plate at each meal with whole grains. Whole grains include whole wheat pasta, brown rice, and whole grain bread. ? Eating or drinking low-fat dairy products, such as skim milk or low-fat yogurt. ? Filling one quarter of your plate at each meal with low-fat (lean) proteins. Low-fat proteins include fish, skinless chicken, eggs, beans, and tofu. ? Avoiding fatty meat, cured and processed meat, or chicken with skin. ? Avoiding premade or processed food.  Eat less than 1,500 mg of salt (sodium) a day.  Limit alcohol use to no more than 1 drink a day for nonpregnant women and 2 drinks a day for men. One drink equals 12 oz of beer, 5 oz of wine, or 1 oz of hard liquor. Lifestyle  Work with your doctor to stay at a healthy weight or to lose weight. Ask your doctor what the best weight is for you.  Get at least 30 minutes of exercise that causes your heart to beat faster (aerobic exercise) most days of the week. This may include walking, swimming, or biking.  Get at least 30 minutes of exercise that strengthens your muscles (resistance exercise) at least 3 days a week. This may include lifting weights or pilates.  Do  not use any products that contain nicotine or tobacco. This includes cigarettes and e-cigarettes. If you need help quitting, ask your doctor.  Check your blood pressure at home as told by your doctor.  Keep all follow-up visits as told by your doctor. This is important. Medicines  Take over-the-counter and prescription medicines only as told by your doctor. Follow directions carefully.  Do not skip doses of blood pressure medicine. The medicine does not work as well if you skip doses. Skipping doses also puts you at risk for problems.  Ask your doctor about side effects or reactions to medicines that you should watch for. Contact a doctor if:  You think you are having a reaction to the medicine you are taking.  You have headaches that keep coming back (recurring).  You feel dizzy.  You have swelling in your ankles.  You have trouble with your vision. Get help right away if:  You get a very bad headache.  You start to feel confused.  You feel weak or numb.  You feel faint.  You get very bad pain in your: ? Chest. ? Belly (abdomen).  You throw up (vomit) more than once.  You have trouble breathing. Summary  Hypertension is another name for high blood pressure.  Making healthy choices can help lower blood pressure. If your blood pressure cannot be controlled with healthy choices, you may need to take medicine. This information is not intended to replace advice given to you by your  health care provider. Make sure you discuss any questions you have with your health care provider. Document Released: 04/24/2008 Document Revised: 10/04/2016 Document Reviewed: 10/04/2016 Elsevier Interactive Patient Education  [employer].

## 2017-12-13 NOTE — Assessment & Plan Note (Signed)
Check level today 

## 2017-12-14 MED FILL — IRBESARTAN 300 MG TABLET: 300 | 90 days supply | Qty: 90 | Fill #0 | Status: TO

## 2017-12-14 NOTE — Telephone Encounter (Signed)
Called and made the correct changes for patient

## 2017-12-16 DIAGNOSIS — I729 Aneurysm of unspecified site: Secondary | ICD-10-CM | POA: Insufficient documentation

## 2017-12-16 NOTE — Assessment & Plan Note (Signed)
Encouraged DASH diet, decrease po intake and increase exercise as tolerated. Needs 7-8 hours of sleep nightly. Avoid trans fats, eat small, frequent meals every 4-5 hours with lean proteins, complex carbs and healthy fats. Minimize simple carbs, consider bariatric referral 

## 2017-12-16 NOTE — Assessment & Plan Note (Signed)
ossible small aneurysm LUQ on CT scan will monitor

## 2017-12-17 ENCOUNTER — Other Ambulatory Visit: Payer: No Typology Code available for payment source

## 2017-12-20 ENCOUNTER — Encounter: Payer: Self-pay | Admitting: Family Medicine

## 2017-12-22 LAB — METANEPHRINES, URINE, 24 HOUR
METANEPH TOTAL UR: 95 ug/(24.h) — AB (ref 224–832)
Metanephrines, Ur: 23 mcg/24 h — ABNORMAL LOW (ref 90–315)
NORMETANEPHRINE 24H UR: 72 ug/(24.h) — AB (ref 122–676)
VOLUME, URINE-VMAUR: 1100 mL

## 2017-12-24 ENCOUNTER — Ambulatory Visit: Payer: Self-pay | Admitting: Family Medicine

## 2017-12-31 DIAGNOSIS — G4733 Obstructive sleep apnea (adult) (pediatric): Secondary | ICD-10-CM | POA: Diagnosis not present

## 2018-01-03 ENCOUNTER — Encounter: Payer: Self-pay | Admitting: Family Medicine

## 2018-01-03 ENCOUNTER — Other Ambulatory Visit: Payer: Self-pay | Admitting: Family Medicine

## 2018-01-03 DIAGNOSIS — R838 Other abnormal findings in cerebrospinal fluid: Principal | ICD-10-CM

## 2018-01-03 DIAGNOSIS — R82998 Other abnormal findings in urine: Principal | ICD-10-CM

## 2018-01-03 DIAGNOSIS — R0989 Other specified symptoms and signs involving the circulatory and respiratory systems: Secondary | ICD-10-CM

## 2018-01-03 DIAGNOSIS — R7989 Other specified abnormal findings of blood chemistry: Secondary | ICD-10-CM

## 2018-01-17 ENCOUNTER — Other Ambulatory Visit: Payer: No Typology Code available for payment source

## 2018-01-17 ENCOUNTER — Ambulatory Visit (INDEPENDENT_AMBULATORY_CARE_PROVIDER_SITE_OTHER): Payer: No Typology Code available for payment source | Admitting: Family Medicine

## 2018-01-17 VITALS — BP 136/84 | HR 74 | Resp 14

## 2018-01-17 DIAGNOSIS — I10 Essential (primary) hypertension: Secondary | ICD-10-CM

## 2018-01-17 LAB — COMPREHENSIVE METABOLIC PANEL
ALBUMIN: 3.6 g/dL (ref 3.5–5.2)
ALT: 11 U/L (ref 0–35)
AST: 11 U/L (ref 0–37)
Alkaline Phosphatase: 44 U/L (ref 39–117)
BILIRUBIN TOTAL: 0.4 mg/dL (ref 0.2–1.2)
BUN: 24 mg/dL — ABNORMAL HIGH (ref 6–23)
CALCIUM: 9.1 mg/dL (ref 8.4–10.5)
CHLORIDE: 103 meq/L (ref 96–112)
CO2: 27 mEq/L (ref 19–32)
CREATININE: 0.99 mg/dL (ref 0.40–1.20)
GFR: 62.46 mL/min (ref >60.00)
Glucose, Bld: 210 mg/dL — ABNORMAL HIGH (ref 70–99)
Potassium: 3.6 mEq/L (ref 3.5–5.1)
Sodium: 140 mEq/L (ref 135–145)
Total Protein: 7 g/dL (ref 6.0–8.3)

## 2018-01-17 NOTE — Patient Instructions (Signed)
BP looks great! Lab before you leave.   Continue taking same medications.  Follow-up with Dr. Charlett Blake as already scheduled on 02/05/2018.

## 2018-01-17 NOTE — Progress Notes (Signed)
Pre visit review using our clinic review tool, if applicable. No additional management support is needed unless otherwise documented below in the visit note.  Pt here today for BP check w/ cmp. At last OV w/ Dr. Charlett Blake on 12/13/2017- irbesartan-hctz 300-12.5mg  was d/c and changed to irbesartan 300mg  tablet and added triamterene-hctz 37.5-25mg .   BP in L arm: 136/84 Pulse: 74  Per Dr. Charlett Blake- continue same meds. Follow-up as already scheduled on 02/05/2018  Nursing blood pressure check note reviewed. Agree with documention and plan.

## 2018-01-18 ENCOUNTER — Encounter: Payer: Self-pay | Admitting: Family Medicine

## 2018-01-18 NOTE — Telephone Encounter (Signed)
Patient sent additional mychart message asking if Maxide could have caused this?

## 2018-01-31 ENCOUNTER — Telehealth: Payer: Self-pay | Admitting: *Deleted

## 2018-01-31 NOTE — Telephone Encounter (Signed)
Received request for Medical Records from [facility]; forwarded to [place] for email/scan/SLS 03/14

## 2018-02-05 ENCOUNTER — Ambulatory Visit (INDEPENDENT_AMBULATORY_CARE_PROVIDER_SITE_OTHER): Payer: No Typology Code available for payment source | Admitting: Family Medicine

## 2018-02-05 ENCOUNTER — Encounter: Payer: Self-pay | Admitting: Family Medicine

## 2018-02-05 VITALS — BP 148/90 | HR 63 | Temp 98.1°F | Resp 18 | Wt 246.0 lb

## 2018-02-05 DIAGNOSIS — R739 Hyperglycemia, unspecified: Secondary | ICD-10-CM | POA: Diagnosis not present

## 2018-02-05 DIAGNOSIS — I471 Supraventricular tachycardia, unspecified: Secondary | ICD-10-CM

## 2018-02-05 DIAGNOSIS — E785 Hyperlipidemia, unspecified: Secondary | ICD-10-CM | POA: Diagnosis not present

## 2018-02-05 DIAGNOSIS — E6609 Other obesity due to excess calories: Secondary | ICD-10-CM

## 2018-02-05 DIAGNOSIS — R82998 Other abnormal findings in urine: Secondary | ICD-10-CM

## 2018-02-05 DIAGNOSIS — R838 Other abnormal findings in cerebrospinal fluid: Secondary | ICD-10-CM

## 2018-02-05 DIAGNOSIS — I10 Essential (primary) hypertension: Secondary | ICD-10-CM

## 2018-02-05 DIAGNOSIS — R7989 Other specified abnormal findings of blood chemistry: Secondary | ICD-10-CM

## 2018-02-05 DIAGNOSIS — R839 Unspecified abnormal finding in cerebrospinal fluid: Secondary | ICD-10-CM | POA: Diagnosis not present

## 2018-02-05 DIAGNOSIS — R8789 Other abnormal findings in specimens from female genital organs: Secondary | ICD-10-CM

## 2018-02-05 LAB — COMPREHENSIVE METABOLIC PANEL
ALBUMIN: 4.1 g/dL (ref 3.5–5.2)
ALT: 15 U/L (ref 0–35)
AST: 13 U/L (ref 0–37)
Alkaline Phosphatase: 46 U/L (ref 39–117)
BUN: 29 mg/dL — AB (ref 6–23)
CHLORIDE: 102 meq/L (ref 96–112)
CO2: 29 meq/L (ref 19–32)
Calcium: 9.8 mg/dL (ref 8.4–10.5)
Creatinine, Ser: 0.79 mg/dL (ref 0.40–1.20)
GFR: 81.02 mL/min (ref >60.00)
GLUCOSE: 110 mg/dL — AB (ref 70–99)
Potassium: 3.8 mEq/L (ref 3.5–5.1)
SODIUM: 138 meq/L (ref 135–145)
Total Bilirubin: 0.4 mg/dL (ref 0.2–1.2)
Total Protein: 7.4 g/dL (ref 6.0–8.3)

## 2018-02-05 MED ORDER — ESCITALOPRAM OXALATE 10 MG PO TABS: 10.0000 mg | ORAL_TABLET | Freq: Every day | ORAL | 1 refills | Status: DC

## 2018-02-05 MED ORDER — METFORMIN HCL 500 MG PO TABS: 500.0000 mg | ORAL_TABLET | Freq: Every day | ORAL | 3 refills | Status: DC

## 2018-02-05 MED FILL — ESCITALOPRAM 10 MG TABLET: 10 | 90 days supply | Qty: 90 | Fill #0 | Status: TO

## 2018-02-05 MED FILL — metFORMIN HCL 500 MG TABS: 500 | 90 days supply | Qty: 90 | Fill #0

## 2018-02-05 NOTE — Patient Instructions (Signed)

## 2018-02-06 DIAGNOSIS — R7989 Other specified abnormal findings of blood chemistry: Secondary | ICD-10-CM | POA: Insufficient documentation

## 2018-02-06 DIAGNOSIS — R82998 Other abnormal findings in urine: Secondary | ICD-10-CM

## 2018-02-06 DIAGNOSIS — R838 Other abnormal findings in cerebrospinal fluid: Secondary | ICD-10-CM

## 2018-02-06 LAB — INSULIN, RANDOM: Insulin: 15.3 u[IU]/mL (ref 2.0–19.6)

## 2018-02-06 NOTE — Assessment & Plan Note (Signed)
Encouraged heart healthy diet, increase exercise, avoid trans fats, consider a krill oil cap daily 

## 2018-02-06 NOTE — Assessment & Plan Note (Signed)
RRR today 

## 2018-02-06 NOTE — Progress Notes (Signed)
Patient ID: Misty Mahoney, female   DOB: [birth year], 52 y.o.   MRN: [id number]   Subjective:    Patient ID: Misty Mahoney, female    DOB: [birth year], 52 y.o.   MRN: [id number]  No chief complaint on file.   HPI Patient is in today for follow up. She continues to struggle with fatigue, anhedonia but is hopeful that her switch to outpatient nursing will help. No recent febrile illness or hospitalizations. She has been checking her sugars recently and notes they have run from 110 to 140. No polyuria or polydipsia. Denies CP/palp/SOB/HA/congestion/fevers/GI or GU c/o. Taking meds as prescribed  Past Medical History:  Diagnosis Date  . Anemia    gestational  . Atrial fibrillation ([place])   . Chicken pox as a child  . Depression 06/15/2015  . DM, gestational, diet controlled 04/10/2014  . H/O gestational diabetes mellitus, not currently pregnant 04/10/2014  . History of chicken pox   . Hyperlipidemia 12/20/2015  . Hypertension   . Obesity, unspecified 04/10/2014  . OSA (obstructive sleep apnea)   . Preventative health care 12/26/2015  . SVT (supraventricular tachycardia) ([place])    s/p RFCA 09/26/12  . Vitamin D deficiency 12/26/2015    Past Surgical History:  Procedure Laterality Date  . ADENOIDECTOMY    . CARDIAC ELECTROPHYSIOLOGY STUDY AND ABLATION  2013  . ENDOVENOUS ABLATION SAPHENOUS VEIN W/ LASER Bilateral    Thermal ablation  . KNEE ARTHROSCOPY WITH MENISCAL REPAIR Left 08/25/2016   Procedure: KNEE ARTHROSCOPY WITH partial medial meniscectomy, chondroplasty;  Surgeon: Melrose Nakayama, MD;  Location: [facility];  Service: Orthopedics;  Laterality: Left;  KNEE ARTHROSCOPY WITH partial medial meniscectomy, chondroplasty  . PALATE / UVULA BIOPSY / EXCISION    . TONSILLECTOMY  52 yrs old  . V-TACH ABLATION N/A 09/26/2012   Procedure: V-TACH ABLATION;  Surgeon: Evans Lance, MD;  Location: [facility] CATH LAB;  Service: Cardiovascular;  Laterality: N/A;  . WISDOM TOOTH EXTRACTION  52  yrs old    Family History  Problem Relation Age of Onset  . Hypertension Mother   . Dementia Mother   . Dementia Father   . Hypertension Father   . Cataracts Father        bilateral  . Alzheimer's disease Father   . Cancer Sister 20       metastatic  . Hypertension Brother   . ADD / ADHD Daughter   . ADD / ADHD Son   . Heart attack Maternal Grandfather        pacemaker  . Cancer Maternal Grandfather        ?  Marland Kitchen Cancer Paternal Grandmother        colon    Social History   Socioeconomic History  . Marital status: Legally Separated    Spouse name: Not on file  . Number of children: Not on file  . Years of education: Not on file  . Highest education level: Not on file  Social Needs  . Financial resource strain: Not on file  . Food insecurity - worry: Not on file  . Food insecurity - inability: Not on file  . Transportation needs - medical: Not on file  . Transportation needs - non-medical: Not on file  Occupational History  . Not on file  Tobacco Use  . Smoking status: Never Smoker  . Smokeless tobacco: Never Used  Substance and Sexual Activity  . Alcohol use: Yes    Comment: two or three times a  year  . Drug use: No  . Sexual activity: Yes    Partners: Male    Comment: lives with kids, significant other and step daughter. minimizing dairy and gluten, exercise  Other Topics Concern  . Not on file  Social History Narrative  . Not on file    Outpatient Medications Prior to Visit  Medication Sig Dispense Refill  . Cholecalciferol (VITAMIN D) 2000 units CAPS Take 1 capsule by mouth daily.    Marland Kitchen diltiazem (CARDIZEM CD) 180 MG 24 hr capsule Take 1 capsule (180 mg total) by mouth 2 (two) times daily. 180 capsule 3  . ergocalciferol (VITAMIN D2) 50000 units capsule Take 1 capsule (50,000 Units total) by mouth once a week. 12 capsule 1  . Ferrous Fumarate (HEMOCYTE) 324 (106 Fe) MG TABS tablet Take 1 tablet (106 mg of iron total) by mouth daily. 30 tablet 3  .  flecainide (TAMBOCOR) 150 MG tablet Take 2 tablets (300 mg total) by mouth as needed. 18 tablet 1  . irbesartan (AVAPRO) 300 MG tablet Take 1 tablet (300 mg total) by mouth daily. 90 tablet 1  . Omega-3 Fatty Acids (FISH OIL PO) Take 1 capsule by mouth daily.    Marland Kitchen triamterene-hydrochlorothiazide (MAXZIDE-25) 37.5-25 MG tablet Take 1 tablet by mouth daily. 90 tablet 1  . escitalopram (LEXAPRO) 10 MG tablet Take 1 tablet (10 mg total) by mouth daily. 30 tablet 2   No facility-administered medications prior to visit.     Allergies  Allergen Reactions  . Sunflower Oil Swelling    Review of Systems  Constitutional: Positive for malaise/fatigue. Negative for fever.  HENT: Negative for congestion.   Eyes: Negative for blurred vision.  Respiratory: Negative for shortness of breath.   Cardiovascular: Negative for chest pain, palpitations and leg swelling.  Gastrointestinal: Negative for abdominal pain, blood in stool and nausea.  Genitourinary: Negative for dysuria and frequency.  Musculoskeletal: Negative for falls.  Skin: Negative for rash.  Neurological: Negative for dizziness, loss of consciousness and headaches.  Endo/Heme/Allergies: Negative for environmental allergies.  Psychiatric/Behavioral: Negative for depression. The patient is not nervous/anxious.        Objective:    Physical Exam  Constitutional: She is oriented to person, place, and time. She appears well-developed and well-nourished. No distress.  HENT:  Head: Normocephalic and atraumatic.  Nose: Nose normal.  Eyes: Right eye exhibits no discharge. Left eye exhibits no discharge.  Neck: Normal range of motion. Neck supple.  Cardiovascular: Normal rate and regular rhythm.  No murmur heard. Pulmonary/Chest: Effort normal and breath sounds normal.  Abdominal: Soft. Bowel sounds are normal. There is no tenderness.  Musculoskeletal: She exhibits no edema.  Neurological: She is alert and oriented to person, place, and  time.  Skin: Skin is warm and dry.  Psychiatric: She has a normal mood and affect.  Nursing note and vitals reviewed.   BP (!) 148/90 (BP Location: Left Arm, Patient Position: Sitting, Cuff Size: Normal)   Pulse 63   Temp 98.1 F (36.7 C) (Oral)   Resp 18   Wt 246 lb (111.6 kg)   LMP 04/17/2017   SpO2 99%   BMI 37.40 kg/m  Wt Readings from Last 3 Encounters:  02/05/18 246 lb (111.6 kg)  12/13/17 245 lb 12.8 oz (111.5 kg)  11/27/17 247 lb 4 oz (112.2 kg)     Lab Results  Component Value Date   WBC 6.3 12/13/2017   HGB 11.5 (L) 12/13/2017   HCT 35.2 (L) 12/13/2017  PLT 271.0 12/13/2017   GLUCOSE 110 (H) 02/05/2018   CHOL 158 12/13/2017   TRIG 67.0 12/13/2017   HDL 58.50 12/13/2017   LDLCALC 86 12/13/2017   ALT 15 02/05/2018   AST 13 02/05/2018   NA 138 02/05/2018   K 3.8 02/05/2018   CL 102 02/05/2018   CREATININE 0.79 02/05/2018   BUN 29 (H) 02/05/2018   CO2 29 02/05/2018   TSH 1.19 12/13/2017   HGBA1C 6.2 12/13/2017    Lab Results  Component Value Date   TSH 1.19 12/13/2017   Lab Results  Component Value Date   WBC 6.3 12/13/2017   HGB 11.5 (L) 12/13/2017   HCT 35.2 (L) 12/13/2017   MCV 87.2 12/13/2017   PLT 271.0 12/13/2017   Lab Results  Component Value Date   NA 138 02/05/2018   K 3.8 02/05/2018   CO2 29 02/05/2018   GLUCOSE 110 (H) 02/05/2018   BUN 29 (H) 02/05/2018   CREATININE 0.79 02/05/2018   BILITOT 0.4 02/05/2018   ALKPHOS 46 02/05/2018   AST 13 02/05/2018   ALT 15 02/05/2018   PROT 7.4 02/05/2018   ALBUMIN 4.1 02/05/2018   CALCIUM 9.8 02/05/2018   ANIONGAP 12 08/21/2016   GFR 81.02 02/05/2018   Lab Results  Component Value Date   CHOL 158 12/13/2017   Lab Results  Component Value Date   HDL 58.50 12/13/2017   Lab Results  Component Value Date   LDLCALC 86 12/13/2017   Lab Results  Component Value Date   TRIG 67.0 12/13/2017   Lab Results  Component Value Date   CHOLHDL 3 12/13/2017   Lab Results  Component  Value Date   HGBA1C 6.2 12/13/2017       Assessment & Plan:   Problem List Items Addressed This Visit    SVT (supraventricular tachycardia) ([place])    RRR today      Hypertension    Irbesartan 300 mg daily      Obesity    Encouraged DASH diet, decrease po intake and increase exercise as tolerated. Needs 7-8 hours of sleep nightly. Avoid trans fats, eat small, frequent meals every 4-5 hours with lean proteins, complex carbs and healthy fats. Minimize simple carbs, GMO foods.      Relevant Medications   metFORMIN (GLUCOPHAGE) 500 MG tablet   Hyperlipidemia    Encouraged heart healthy diet, increase exercise, avoid trans fats, consider a krill oil cap daily      Hyperglycemia - Primary    hgba1c acceptable, minimize simple carbs. Increase exercise as tolerated. Has been checking sugars and seeing numbers 100 to 140s.       Relevant Orders   Insulin, random (Completed)   Comprehensive metabolic panel (Completed)   Low metanephrine in plasma, urine, and cerebrospinal fluid    Only checked the blood so far and noted to be low have referred to Endocrinology for further consideration         I am having Chakita L. Spahr start on metFORMIN. I am also having her maintain her Vitamin D, Omega-3 Fatty Acids (FISH OIL PO), diltiazem, flecainide, ergocalciferol, Ferrous Fumarate, irbesartan, triamterene-hydrochlorothiazide, and escitalopram.  Meds ordered this encounter  Medications  . escitalopram (LEXAPRO) 10 MG tablet    Sig: Take 1 tablet (10 mg total) by mouth daily.    Dispense:  90 tablet    Refill:  1  . metFORMIN (GLUCOPHAGE) 500 MG tablet    Sig: Take 1 tablet (500 mg total) by mouth daily  with breakfast.    Dispense:  180 tablet    Refill:  3    CMA served as scribe during this visit. History, Physical and Plan performed by medical provider. Documentation and orders reviewed and attested to.  Penni Homans, MD

## 2018-02-06 NOTE — Assessment & Plan Note (Signed)
Irbesartan 300 mg daily

## 2018-02-06 NOTE — Assessment & Plan Note (Signed)
Only checked the blood so far and noted to be low have referred to Endocrinology for further consideration

## 2018-02-06 NOTE — Assessment & Plan Note (Addendum)
hgba1c acceptable, minimize simple carbs. Increase exercise as tolerated. Has been checking sugars and seeing numbers 100 to 140s.

## 2018-02-06 NOTE — Assessment & Plan Note (Signed)
Encouraged DASH diet, decrease po intake and increase exercise as tolerated. Needs 7-8 hours of sleep nightly. Avoid trans fats, eat small, frequent meals every 4-5 hours with lean proteins, complex carbs and healthy fats. Minimize simple carbs, GMO foods. 

## 2018-02-08 MED ORDER — METOPROLOL SUCCINATE ER 25 MG PO TB24: 25.0000 mg | ORAL_TABLET | Freq: Every day | ORAL | 0 refills | Status: DC

## 2018-02-08 MED FILL — METOPROLOL SUCCINATE ER 25: 25 | 90 days supply | Qty: 90 | Fill #0

## 2018-02-08 NOTE — Addendum Note (Signed)
Addended by: Magdalene Molly A on: 02/08/2018 04:03 PM   Modules accepted: Orders

## 2018-02-13 ENCOUNTER — Encounter: Payer: Self-pay | Admitting: Endocrinology

## 2018-02-13 ENCOUNTER — Ambulatory Visit: Payer: No Typology Code available for payment source | Admitting: Endocrinology

## 2018-02-13 VITALS — BP 146/82 | HR 66 | Wt 245.0 lb

## 2018-02-13 DIAGNOSIS — D3501 Benign neoplasm of right adrenal gland: Secondary | ICD-10-CM | POA: Diagnosis not present

## 2018-02-13 NOTE — Patient Instructions (Signed)
blood tests are requested for you today.  We'll let you know about the results. Let's recheck the CT.  you will receive a phone call, about a day and time for an appointment. I would be happy to see you back here as needed.

## 2018-02-13 NOTE — Progress Notes (Signed)
Subjective:    Patient ID: Misty Mahoney, female    DOB: [birth year], 52 y.o.   MRN: [id number]  HPI In Jan of 2019, pt was incidentally noted to had an adrenal nodule.  She has difficulty to control HTN.  Pt is referred by Dr Charlett Blake.  Pt reports slight weight gain, throughout the body, and assoc fatigue.  She has not recently taken any steroids.  She has no h/o cancer, pituitary disorder, skin ulcers, cataracts, PUD, osteoporosis, DM, infection, mult bony fractures, or adrenal disorder.   Past Medical History:  Diagnosis Date  . Anemia    gestational  . Atrial fibrillation ([place])   . Chicken pox as a child  . Depression 06/15/2015  . DM, gestational, diet controlled 04/10/2014  . H/O gestational diabetes mellitus, not currently pregnant 04/10/2014  . History of chicken pox   . Hyperlipidemia 12/20/2015  . Hypertension   . Obesity, unspecified 04/10/2014  . OSA (obstructive sleep apnea)   . Preventative health care 12/26/2015  . SVT (supraventricular tachycardia) ([place])    s/p RFCA 09/26/12  . Vitamin D deficiency 12/26/2015    Past Surgical History:  Procedure Laterality Date  . ADENOIDECTOMY    . CARDIAC ELECTROPHYSIOLOGY STUDY AND ABLATION  2013  . ENDOVENOUS ABLATION SAPHENOUS VEIN W/ LASER Bilateral    Thermal ablation  . KNEE ARTHROSCOPY WITH MENISCAL REPAIR Left 08/25/2016   Procedure: KNEE ARTHROSCOPY WITH partial medial meniscectomy, chondroplasty;  Surgeon: Melrose Nakayama, MD;  Location: [place];  Service: Orthopedics;  Laterality: Left;  KNEE ARTHROSCOPY WITH partial medial meniscectomy, chondroplasty  . PALATE / UVULA BIOPSY / EXCISION    . TONSILLECTOMY  52 yrs old  . V-TACH ABLATION N/A 09/26/2012   Procedure: V-TACH ABLATION;  Surgeon: Evans Lance, MD;  Location: [facility] CATH LAB;  Service: Cardiovascular;  Laterality: N/A;  . WISDOM TOOTH EXTRACTION  52 yrs old    Social History   Socioeconomic History  . Marital status: Legally Separated    Spouse  name: Not on file  . Number of children: Not on file  . Years of education: Not on file  . Highest education level: Not on file  Occupational History  . Not on file  Social Needs  . Financial resource strain: Not on file  . Food insecurity:    Worry: Not on file    Inability: Not on file  . Transportation needs:    Medical: Not on file    Non-medical: Not on file  Tobacco Use  . Smoking status: Never Smoker  . Smokeless tobacco: Never Used  Substance and Sexual Activity  . Alcohol use: Yes    Comment: two or three times a year  . Drug use: No  . Sexual activity: Yes    Partners: Male    Comment: lives with kids, significant other and step daughter. minimizing dairy and gluten, exercise  Lifestyle  . Physical activity:    Days per week: Not on file    Minutes per session: Not on file  . Stress: Not on file  Relationships  . Social connections:    Talks on phone: Not on file    Gets together: Not on file    Attends religious service: Not on file    Active member of club or organization: Not on file    Attends meetings of clubs or organizations: Not on file    Relationship status: Not on file  . Intimate partner violence:  Fear of current or ex partner: Not on file    Emotionally abused: Not on file    Physically abused: Not on file    Forced sexual activity: Not on file  Other Topics Concern  . Not on file  Social History Narrative  . Not on file    Current Outpatient Medications on File Prior to Visit  Medication Sig Dispense Refill  . Cholecalciferol (VITAMIN D) 2000 units CAPS Take 1 capsule by mouth daily.    Marland Kitchen diltiazem (CARDIZEM CD) 180 MG 24 hr capsule Take 1 capsule (180 mg total) by mouth 2 (two) times daily. 180 capsule 3  . ergocalciferol (VITAMIN D2) 50000 units capsule Take 1 capsule (50,000 Units total) by mouth once a week. 12 capsule 1  . escitalopram (LEXAPRO) 10 MG tablet Take 1 tablet (10 mg total) by mouth daily. 90 tablet 1  . Ferrous  Fumarate (HEMOCYTE) 324 (106 Fe) MG TABS tablet Take 1 tablet (106 mg of iron total) by mouth daily. 30 tablet 3  . flecainide (TAMBOCOR) 150 MG tablet Take 2 tablets (300 mg total) by mouth as needed. 18 tablet 1  . irbesartan (AVAPRO) 300 MG tablet Take 1 tablet (300 mg total) by mouth daily. 90 tablet 1  . metFORMIN (GLUCOPHAGE) 500 MG tablet Take 1 tablet (500 mg total) by mouth daily with breakfast. 180 tablet 3  . metoprolol succinate (TOPROL-XL) 25 MG 24 hr tablet Take 1 tablet (25 mg total) by mouth daily. 90 tablet 0  . Omega-3 Fatty Acids (FISH OIL PO) Take 1 capsule by mouth daily.    Marland Kitchen triamterene-hydrochlorothiazide (MAXZIDE-25) 37.5-25 MG tablet Take 1 tablet by mouth daily. 90 tablet 1   No current facility-administered medications on file prior to visit.     Allergies  Allergen Reactions  . Sunflower Oil Swelling    Family History  Problem Relation Age of Onset  . Hypertension Mother   . Dementia Mother   . Dementia Father   . Hypertension Father   . Cataracts Father        bilateral  . Alzheimer's disease Father   . Cancer Sister 40       metastatic  . Hypertension Brother   . ADD / ADHD Daughter   . ADD / ADHD Son   . Heart attack Maternal Grandfather        pacemaker  . Cancer Maternal Grandfather        ?  Marland Kitchen Cancer Paternal Grandmother        colon    BP (!) 146/82 (BP Location: Left Arm, Patient Position: Sitting, Cuff Size: Normal)   Pulse 66   Wt 245 lb (111.1 kg)   LMP 04/17/2017   SpO2 97%   BMI 37.25 kg/m     Review of Systems denies fever, blurry vision, headache, hirsutism, dry skin,, polyuria, sob, insomnia, hyperpigmentation, numbness, easy bruising, muscle weakness, and rash on the abdomen.  She has no menses x 6 mos.  She has leg cramps and mild depression.      Objective:   Physical Exam VS: see vs page GEN: no distress HEAD: head: no deformity eyes: no periorbital swelling, no proptosis external nose and ears are  normal mouth: no lesion seen NECK: supple, thyroid is not enlarged CHEST WALL: no deformity.  No prominence of the dorsal upper spine LUNGS: clear to auscultation CV: reg rate and rhythm, no murmur ABD: abdomen is soft, nontender.  no hepatosplenomegaly.  not distended.  no hernia.  No striae. MUSCULOSKELETAL: muscle bulk and strength are grossly normal.  no obvious joint swelling.  gait is normal and steady EXTEMITIES: no deformity.  no edema PULSES: no carotid bruit NEURO:  cn 2-12 grossly intact.   readily moves all 4's.  sensation is intact to touch on all 4's.   SKIN:  Normal texture and temperature.  No rash or suspicious lesion is visible.   NODES:  None palpable at the neck PSYCH: alert, well-oriented.  Does not appear anxious nor depressed.  cortisol=4  Lab Results  Component Value Date   CREATININE 0.79 02/05/2018   BUN 29 (H) 02/05/2018   NA 138 02/05/2018   K 3.8 02/05/2018   CL 102 02/05/2018   CO2 29 02/05/2018   Lab Results  Component Value Date   TSH 1.19 12/13/2017   2.1 cm right adrenal gland adenoma  24-HR urine catechols: low  I have reviewed outside records, and summarized:  Pt was noted to have elevated a1c, and referred here.  Other probs were addressed, including SVT, hyperglycemia, HTN, and dyslipidemia.        Assessment & Plan:  Adrenal adenoma, new, prob nonsecretory.   Low catecholamine secretion: I told pt this does not need further eval, unless she has hypotension.   Patient Instructions  blood tests are requested for you today.  We'll let you know about the results. Let's recheck the CT.  you will receive a phone call, about a day and time for an appointment. I would be happy to see you back here as needed.

## 2018-02-14 ENCOUNTER — Other Ambulatory Visit: Payer: No Typology Code available for payment source

## 2018-02-14 DIAGNOSIS — D3501 Benign neoplasm of right adrenal gland: Secondary | ICD-10-CM

## 2018-02-19 LAB — ALDOSTERONE + RENIN ACTIVITY W/ RATIO
ALDO / PRA Ratio: 0.5 Ratio — ABNORMAL LOW (ref 0.9–28.9)
Aldosterone: 6 ng/dL
RENIN ACTIVITY: 13.27 ng/mL/h — AB (ref 0.25–5.82)

## 2018-02-21 ENCOUNTER — Encounter (INDEPENDENT_AMBULATORY_CARE_PROVIDER_SITE_OTHER): Payer: Self-pay

## 2018-02-28 ENCOUNTER — Encounter (INDEPENDENT_AMBULATORY_CARE_PROVIDER_SITE_OTHER): Payer: No Typology Code available for payment source

## 2018-03-01 ENCOUNTER — Encounter: Payer: Self-pay | Admitting: Family Medicine

## 2018-03-01 DIAGNOSIS — Z6838 Body mass index (BMI) 38.0-38.9, adult: Principal | ICD-10-CM

## 2018-03-01 DIAGNOSIS — E6609 Other obesity due to excess calories: Secondary | ICD-10-CM

## 2018-03-05 ENCOUNTER — Ambulatory Visit (INDEPENDENT_AMBULATORY_CARE_PROVIDER_SITE_OTHER): Payer: No Typology Code available for payment source | Admitting: Family Medicine

## 2018-03-05 ENCOUNTER — Encounter (INDEPENDENT_AMBULATORY_CARE_PROVIDER_SITE_OTHER): Payer: Self-pay | Admitting: Family Medicine

## 2018-03-05 VITALS — BP 123/72 | HR 60 | Temp 98.0°F | Ht 68.0 in | Wt 244.0 lb

## 2018-03-05 DIAGNOSIS — Z1331 Encounter for screening for depression: Secondary | ICD-10-CM | POA: Diagnosis not present

## 2018-03-05 DIAGNOSIS — R7303 Prediabetes: Secondary | ICD-10-CM | POA: Diagnosis not present

## 2018-03-05 DIAGNOSIS — R0602 Shortness of breath: Secondary | ICD-10-CM | POA: Diagnosis not present

## 2018-03-05 DIAGNOSIS — D508 Other iron deficiency anemias: Secondary | ICD-10-CM

## 2018-03-05 DIAGNOSIS — Z9189 Other specified personal risk factors, not elsewhere classified: Secondary | ICD-10-CM | POA: Diagnosis not present

## 2018-03-05 DIAGNOSIS — Z6837 Body mass index (BMI) 37.0-37.9, adult: Secondary | ICD-10-CM | POA: Diagnosis not present

## 2018-03-05 DIAGNOSIS — R5383 Other fatigue: Secondary | ICD-10-CM | POA: Diagnosis not present

## 2018-03-05 DIAGNOSIS — I480 Paroxysmal atrial fibrillation: Secondary | ICD-10-CM | POA: Diagnosis not present

## 2018-03-05 DIAGNOSIS — Z0289 Encounter for other administrative examinations: Secondary | ICD-10-CM

## 2018-03-05 DIAGNOSIS — E559 Vitamin D deficiency, unspecified: Secondary | ICD-10-CM

## 2018-03-05 MED ORDER — ASPIRIN EC 81 MG PO TBEC: 81.0000 mg | DELAYED_RELEASE_TABLET | Freq: Every day | ORAL | 0 refills | Status: DC

## 2018-03-05 NOTE — Progress Notes (Signed)
Office: [phone]  /  Fax: [phone]   Dear Dr. Charlett Blake,   Thank you for referring Misty Mahoney to our clinic. The following note includes my evaluation and treatment recommendations.  HPI:   Chief Complaint: Misty Mahoney has been referred by Erline Levine A. Charlett Blake for consultation regarding her obesity and obesity related comorbidities.    Misty Mahoney (MR# [id number]) is a 52 y.o. female who presents on 03/05/2018 for obesity evaluation and treatment. Current BMI is Body mass index is 37.1 kg/m.Marland Kitchen Misty Mahoney has been struggling with her weight for many years and has been unsuccessful in either losing weight, maintaining weight loss, or reaching her healthy weight goal.     Misty Mahoney attended our information session and states she is currently in the action stage of change and ready to dedicate time achieving and maintaining a healthier weight. Misty Mahoney is interested in becoming our Misty Mahoney and working on intensive lifestyle modifications including (but not limited to) diet, exercise and weight loss.    Misty Mahoney states her family eats meals together she thinks her family will eat healthier with  her her desired weight loss is 75 lbs she started gaining weight in her mid 20's her heaviest weight ever was 255 lbs. she frequently makes poor food choices she frequently eats larger portions than normal  she has binge eating behaviors she struggles with emotional eating    Fatigue Misty Mahoney feels her energy is lower than it should be. This has worsened with weight gain and has not worsened recently. Misty Mahoney admits to daytime somnolence and admits to waking up still tired. Misty Mahoney has a diagnosis of obstructive sleep apnea which may be contributing to her fatigue. Patent has a history of symptoms of daytime fatigue and morning fatigue. Misty Mahoney generally gets 7 to 9 hours of sleep per night, and states they generally have restful sleep with CPAP. Snoring is present. Apneic episodes is present.  Epworth Sleepiness Score is 8  Dyspnea on exertion Misty Mahoney notes increasing shortness of breath with exercising and seems to be worsening over time with weight gain. She notes getting out of breath sooner with activity than she used to. This has not gotten worse recently. Misty Mahoney denies orthopnea.  Atrial Fibrillation Misty Mahoney has a diagnosis of Afib and has no recent episodes (over the last 2 months). She notes increased episodes in the past with caffeine, alcohol and decongestants. She had a history of SVT ablation, now Afib. She is not currently anticoagulated, against cardiology's advice.  Vitamin D deficiency Misty Mahoney has a diagnosis of vitamin D deficiency. She is on OTC vit D and prescription vit D. She admits fatigue and denies nausea, vomiting or muscle weakness.  Pre-Diabetes Misty Mahoney recently started metformin and there is a question if she has pre-diabetes or diabetes (has had a glucose > 200 this year). She is taking metformin currently and she is attempting to work on diet and exercise to decrease risk of diabetes. She denies nausea or hypoglycemia.  At risk for diabetes Misty Mahoney is at higher than average risk for developing diabetes due to her obesity and pre-diabetes. She currently denies polyuria or polydipsia.  Anemia Misty Mahoney has a diagnosis of anemia. She is not taking iron due to constipation. She notes fatigue and denies lightheadedness.  Depression Screen Misty Mahoney's Food and Mood (modified PHQ-9) score was  Depression screen PHQ 2/9 03/05/2018  Decreased Interest 3  Down, Depressed, Hopeless 1  PHQ - 2 Score 4  Altered sleeping 1  Tired, decreased energy  3  Change in appetite 1  Feeling bad or failure about yourself  1  Trouble concentrating 1  Moving slowly or fidgety/restless 0  Suicidal thoughts 0  PHQ-9 Score 11  Difficult doing work/chores Not difficult at all    ALLERGIES: Allergies  Allergen Reactions  . Sunflower Oil Swelling    MEDICATIONS: Current Outpatient  Medications on File Prior to Visit  Medication Sig Dispense Refill  . Cholecalciferol (VITAMIN D) 2000 units CAPS Take 1 capsule by mouth daily.    Marland Kitchen diltiazem (CARDIZEM CD) 180 MG 24 hr capsule Take 1 capsule (180 mg total) by mouth 2 (two) times daily. 180 capsule 3  . ergocalciferol (VITAMIN D2) 50000 units capsule Take 1 capsule (50,000 Units total) by mouth once a week. 12 capsule 1  . escitalopram (LEXAPRO) 10 MG tablet Take 1 tablet (10 mg total) by mouth daily. 90 tablet 1  . flecainide (TAMBOCOR) 150 MG tablet Take 2 tablets (300 mg total) by mouth as needed. 18 tablet 1  . irbesartan (AVAPRO) 300 MG tablet Take 1 tablet (300 mg total) by mouth daily. 90 tablet 1  . Magnesium 250 MG TABS Take 1 tablet by mouth daily.    . metFORMIN (GLUCOPHAGE) 500 MG tablet Take 1 tablet (500 mg total) by mouth daily with breakfast. 180 tablet 3  . Omega-3 Fatty Acids (FISH OIL PO) Take 1 capsule by mouth daily.    Marland Kitchen triamterene-hydrochlorothiazide (MAXZIDE-25) 37.5-25 MG tablet Take 1 tablet by mouth daily. 90 tablet 1   No current facility-administered medications on file prior to visit.     PAST MEDICAL HISTORY: Past Medical History:  Diagnosis Date  . Anemia    gestational  . Atrial fibrillation ([place])   . Chicken pox as a child  . Depression 06/15/2015  . DM, gestational, diet controlled 04/10/2014  . Fatigue   . H/O gestational diabetes mellitus, not currently pregnant 04/10/2014  . History of chicken pox   . Hyperlipidemia 12/20/2015  . Hypertension   . Joint pain   . Obesity, unspecified 04/10/2014  . OSA (obstructive sleep apnea)   . Osteoarthritis   . Palpitations   . Prediabetes   . Preventative health care 12/26/2015  . SVT (supraventricular tachycardia) ([place])    s/p RFCA 09/26/12  . Vitamin D deficiency 12/26/2015    PAST SURGICAL HISTORY: Past Surgical History:  Procedure Laterality Date  . ADENOIDECTOMY    . CARDIAC ELECTROPHYSIOLOGY STUDY AND ABLATION  2013  . ENDOVENOUS  ABLATION SAPHENOUS VEIN W/ LASER Bilateral    Thermal ablation  . KNEE ARTHROSCOPY WITH MENISCAL REPAIR Left 08/25/2016   Procedure: KNEE ARTHROSCOPY WITH partial medial meniscectomy, chondroplasty;  Surgeon: Melrose Nakayama, MD;  Location: [facility];  Service: Orthopedics;  Laterality: Left;  KNEE ARTHROSCOPY WITH partial medial meniscectomy, chondroplasty  . PALATE / UVULA BIOPSY / EXCISION    . TONSILLECTOMY  52 yrs old  . V-TACH ABLATION N/A 09/26/2012   Procedure: V-TACH ABLATION;  Surgeon: Evans Lance, MD;  [facility] C[facility] LAB;  Service: Cardiovascular;  Laterality: N/A;  . WISDOM TOOTH EXTRACTION  52 yrs old    SOCIAL HISTORY: Social History   Tobacco Use  . Smoking status: Never Smoker  . Smokeless tobacco: Never Used  Substance Use Topics  . Alcohol use: Yes    Comment: two or three times a year  . Drug use: No    FAMILY HISTORY: Family History  Problem Relation Age of Onset  . Hypertension  Mother   . Dementia Mother   . Stroke Mother   . Dementia Father   . Hypertension Father   . Cataracts Father        bilateral  . Alzheimer's disease Father   . Cancer Sister 46       metastatic  . Hypertension Brother   . ADD / ADHD Daughter   . ADD / ADHD Son   . Heart attack Maternal Grandfather        pacemaker  . Cancer Maternal Grandfather        ?  Marland Kitchen Cancer Paternal Grandmother        colon    ROS: Review of Systems  Constitutional: Positive for malaise/fatigue.  HENT:       Difficult or Painful Swallowing  Eyes:       Vision Changes Floaters  Respiratory: Positive for shortness of breath (onn exertion).   Cardiovascular: Positive for palpitations. Negative for orthopnea.       Leg Cramping   Gastrointestinal: Negative for nausea and vomiting.  Genitourinary: Negative for frequency.  Musculoskeletal:       Muscle or Joint Pain Negative for muscle weakness  Neurological:       Negative for lightheadedness  Endo/Heme/Allergies:  Negative for polydipsia.       Negative for hypoglycemia  Psychiatric/Behavioral:       Stress    PHYSICAL EXAM: Blood pressure 123/72, pulse 60, temperature 98 F (36.7 C), temperature source Oral, height 5\' 8"  (1.727 m), weight 244 lb (110.7 kg), last menstrual period 08/18/2017, SpO2 99 %. Body mass index is 37.1 kg/m. Physical Exam  Constitutional: She is oriented to person, place, and time. She appears well-developed and well-nourished.  HENT:  Head: Normocephalic and atraumatic.  Nose: Nose normal.  Eyes: EOM are normal. No scleral icterus.  Neck: Normal range of motion. Neck supple. No thyromegaly present.  Cardiovascular: Regular rhythm. Bradycardia present.  Murmur heard.  Systolic (early harsh) murmur is present with a grade of 2/6. Pulmonary/Chest: Effort normal. No respiratory distress.  Abdominal: Soft. There is no tenderness.  + obesity  Musculoskeletal: Normal range of motion.  Range of Motion normal in all 4 extremities  Neurological: She is alert and oriented to person, place, and time. Coordination normal.  Skin: Skin is warm and dry.  Psychiatric: She has a normal mood and affect. Her behavior is normal.  Vitals reviewed.   RECENT LABS AND TESTS: BMET    Component Value Date/Time   NA 138 02/05/2018 0951   K 3.8 02/05/2018 0951   CL 102 02/05/2018 0951   CO2 29 02/05/2018 0951   GLUCOSE 110 (H) 02/05/2018 0951   BUN 29 (H) 02/05/2018 0951   CREATININE 0.79 02/05/2018 0951   CREATININE 0.59 06/04/2014 1029   CALCIUM 9.8 02/05/2018 0951   GFRNONAA >60 08/21/2016 1642   GFRAA >60 08/21/2016 1642   Lab Results  Component Value Date   HGBA1C 6.2 12/13/2017   No results found for: INSULIN CBC    Component Value Date/Time   WBC 6.3 12/13/2017 1029   RBC 4.04 12/13/2017 1029   HGB 11.5 (L) 12/13/2017 1029   HCT 35.2 (L) 12/13/2017 1029   PLT 271.0 12/13/2017 1029   MCV 87.2 12/13/2017 1029   MCH 28.6 02/07/2016 1022   MCHC 32.7 12/13/2017 1029    RDW 14.3 12/13/2017 1029   LYMPHSABS 2.4 02/07/2016 1022   MONOABS 0.3 02/07/2016 1022   EOSABS 0.1 02/07/2016 1022   BASOSABS 0.0  02/07/2016 1022   Iron/TIBC/Ferritin/ %Sat    Component Value Date/Time   FERRITIN 21.3 05/01/2017 0943   Lipid Panel     Component Value Date/Time   CHOL 158 12/13/2017 1029   TRIG 67.0 12/13/2017 1029   HDL 58.50 12/13/2017 1029   CHOLHDL 3 12/13/2017 1029   VLDL 13.4 12/13/2017 1029   LDLCALC 86 12/13/2017 1029   Hepatic Function Panel     Component Value Date/Time   PROT 7.4 02/05/2018 0951   ALBUMIN 4.1 02/05/2018 0951   AST 13 02/05/2018 0951   ALT 15 02/05/2018 0951   ALKPHOS 46 02/05/2018 0951   BILITOT 0.4 02/05/2018 0951   BILIDIR 0.1 06/04/2014 1029   IBILI 0.3 06/04/2014 1029      Component Value Date/Time   TSH 1.19 12/13/2017 1029   TSH 1.00 11/07/2016 0929   TSH 1.83 12/20/2015 0943   Results for ALYNN, ELLITHORPE (MRN [id number]) as of 03/05/2018 11:57  Ref. Range 06/04/2014 10:29  Vitamin D, 25-Hydroxy Latest Ref Range: 30 - 89 ng/mL 36   ECG  shows NSR with a rate of 59 BPM INDIRECT CALORIMETER done today shows a VO2 of 226 and a REE of 1575.  Her calculated basal metabolic rate is 4656 thus her basal metabolic rate is worse than expected.    ASSESSMENT AND PLAN: Other fatigue - Plan: EKG 12-Lead, CBC with Differential/Platelet, Comprehensive metabolic panel, Vitamin C12, Folate, T3, T4, free, TSH, Anemia panel, CANCELED: Vitamin B12, CANCELED: Folate, CANCELED: Iron and TIBC, CANCELED: Ferritin  Shortness of breath on exertion  Pre-diabetes - Plan: Hemoglobin A1c, Insulin, random  Paroxysmal atrial fibrillation (HCC) - Plan: Lipid panel, aspirin EC 81 MG tablet  Other iron deficiency anemia  Vitamin D deficiency - Plan: VITAMIN D 25 Hydroxy (Vit-D Deficiency, Fractures)  Screening for depression  At risk for diabetes mellitus  Class 2 severe obesity with serious comorbidity and body mass index (BMI) of  37.0 to 37.9 in adult, unspecified obesity type (HCC)  PLAN: Fatigue Misty Mahoney was informed that her fatigue may be related to obesity, depression or many other causes. Labs will be ordered, and in the meanwhile Laqueshia has agreed to work on diet, exercise and weight loss to help with fatigue. Proper sleep hygiene was discussed including the need for 7-8 hours of quality sleep each night. A sleep study was not ordered based on symptoms and Epworth score.  Dyspnea on exertion Misty Mahoney's shortness of breath appears to be obesity related and exercise induced. She has agreed to work on weight loss and gradually increase exercise to treat her exercise induced shortness of breath. If Misty Mahoney follows our instructions and loses weight without improvement of her shortness of breath, we will plan to refer to pulmonology. We will monitor this condition regularly. Misty Mahoney agrees to this plan.  Atrial Fibrillation Misty Mahoney agrees to start ECASA (enteric coated aspirin) 81 mg qd #30 with no refills and follow up with our clinic in 2 weeks.  Vitamin D Deficiency Misty Mahoney was informed that low vitamin D levels contributes to fatigue and are associated with obesity, breast, and colon cancer. She agrees to continue to take prescription Vit D @50 ,000 IU every week and OTC Vit D 2,000 IU daily for now. We will check labs and she will follow up for routine testing of vitamin D, at least 2-3 times per year. She was informed of the risk of over-replacement of vitamin D and agrees to not increase her dose unless she discusses this with [place] first.  Pre-Diabetes Misty Mahoney will continue to work on weight loss, exercise, and decreasing simple carbohydrates in her diet to help decrease the risk of diabetes. We dicussed metformin including benefits and risks. She was informed that eating too many simple carbohydrates or too many calories at one sitting increases the likelihood of GI side effects. We will check labs and Misty Mahoney will continue metformin for  now. Misty Mahoney agreed to follow up with [place] as directed to monitor her progress.  Diabetes risk counseling Krissi was given extended (15 minutes) diabetes prevention counseling today. She is 52 y.o. female and has risk factors for diabetes including obesity and pre-diabetes. We discussed intensive lifestyle modifications today with an emphasis on weight loss as well as increasing exercise and decreasing simple carbohydrates in her diet.  Anemia The diagnosis of anemia was discussed with Grisela and was explained in detail. She was given suggestions of iron rich foods and iron supplement was not prescribed. We will check labs and Lura will follow up as directed.  Depression Screen Aniah had a moderately positive depression screening. Depression is commonly associated with obesity and often results in emotional eating behaviors. We will monitor this closely and work on CBT to help improve the non-hunger eating patterns. Referral to Psychology may be required if no improvement is seen as she continues in our clinic.  Obesity Lyrica is currently in the action stage of change and her goal is to continue with weight loss efforts. I recommend Aleanna begin the structured treatment plan as follows:  She has agreed to follow the Category 2 plan +100 calories Daleysa has been instructed to eventually work up to a goal of 150 minutes of combined cardio and strengthening exercise per week for weight loss and overall health benefits. We discussed the following Behavioral Modification Strategies today: increasing lean protein intake, decreasing simple carbohydrates  and work on meal planning and easy cooking plans   She was informed of the importance of frequent follow up visits to maximize her success with intensive lifestyle modifications for her multiple health conditions. She was informed we would discuss her lab results at her next visit unless there is a critical issue that needs to be addressed sooner. Ch[place]nay agreed  to keep her next visit at the agreed upon time to discuss these results.    OBESITY BEHAVIORAL INTERVENTION VISIT  Today's visit was # 1 out of 22.  Starting weight: 244 lbs Starting date: 03/05/18 Today's weight : 244 lbs  Today's date: 03/05/2018 Total lbs lost to date: 0 (Patients must lose 7 lbs in the first 6 months to continue with counseling)   ASK: We discussed the diagnosis of obesity with Misty Mahoney today and Kohana agreed to give [place] permission to discuss obesity behavioral modification therapy today.  ASSESS: Zakyria has the diagnosis of obesity and her BMI today is 37.11 Eliah is in the action stage of change   ADVISE: Yeng was educated on the multiple health risks of obesity as well as the benefit of weight loss to improve her health. She was advised of the need for long term treatment and the importance of lifestyle modifications.  AGREE: Multiple dietary modification options and treatment options were discussed and  Dyan agreed to the above obesity treatment plan.   I, Doreene Nest, am acting as transcriptionist for  Dennard Nip, MD  I have reviewed the above documentation for accuracy and completeness, and I agree with the above. -Dennard Nip, MD

## 2018-03-06 LAB — HEMOGLOBIN A1C
Est. average glucose Bld gHb Est-mCnc: 123 mg/dL
Hgb A1c MFr Bld: 5.9 % — ABNORMAL HIGH (ref 4.8–5.6)

## 2018-03-06 LAB — LIPID PANEL
CHOL/HDL RATIO: 2.7 ratio (ref 0.0–4.4)
Cholesterol, Total: 184 mg/dL (ref 100–199)
HDL: 67 mg/dL (ref >39)
LDL CALC: 100 mg/dL — AB (ref 0–99)
TRIGLYCERIDES: 87 mg/dL (ref 0–149)
VLDL CHOLESTEROL CAL: 17 mg/dL (ref 5–40)

## 2018-03-06 LAB — COMPREHENSIVE METABOLIC PANEL
A/G RATIO: 1.4 (ref 1.2–2.2)
ALBUMIN: 4.4 g/dL (ref 3.5–5.5)
ALT: 14 IU/L (ref 0–32)
AST: 15 IU/L (ref 0–40)
Alkaline Phosphatase: 57 IU/L (ref 39–117)
BILIRUBIN TOTAL: 0.2 mg/dL (ref 0.0–1.2)
BUN / CREAT RATIO: 28 — AB (ref 9–23)
BUN: 23 mg/dL (ref 6–24)
CHLORIDE: 99 mmol/L (ref 96–106)
CO2: 29 mmol/L (ref 20–29)
Calcium: 9.7 mg/dL (ref 8.7–10.2)
Creatinine, Ser: 0.82 mg/dL (ref 0.57–1.00)
GFR, EST AFRICAN AMERICAN: 95 mL/min/{1.73_m2} (ref >59)
GFR, EST NON AFRICAN AMERICAN: 83 mL/min/{1.73_m2} (ref >59)
GLOBULIN, TOTAL: 3.1 g/dL (ref 1.5–4.5)
Glucose: 104 mg/dL — ABNORMAL HIGH (ref 65–99)
POTASSIUM: 4.2 mmol/L (ref 3.5–5.2)
Sodium: 141 mmol/L (ref 134–144)
TOTAL PROTEIN: 7.5 g/dL (ref 6.0–8.5)

## 2018-03-06 LAB — CBC WITH DIFFERENTIAL/PLATELET
Basophils Absolute: 0 10*3/uL (ref 0.0–0.2)
Basos: 1 %
EOS (ABSOLUTE): 0.2 10*3/uL (ref 0.0–0.4)
EOS: 3 %
HEMATOCRIT: 35 % (ref 34.0–46.6)
Hemoglobin: 11.3 g/dL (ref 11.1–15.9)
IMMATURE GRANS (ABS): 0 10*3/uL (ref 0.0–0.1)
Immature Granulocytes: 0 %
LYMPHS: 22 %
Lymphocytes Absolute: 1.9 10*3/uL (ref 0.7–3.1)
MCH: 28.8 pg (ref 26.6–33.0)
MCHC: 32.3 g/dL (ref 31.5–35.7)
MCV: 89 fL (ref 79–97)
MONOS ABS: 0.5 10*3/uL (ref 0.1–0.9)
Monocytes: 5 %
Neutrophils Absolute: 5.8 10*3/uL (ref 1.4–7.0)
Neutrophils: 69 %
PLATELETS: 331 10*3/uL (ref 150–379)
RBC: 3.92 x10E6/uL (ref 3.77–5.28)
RDW: 13.8 % (ref 12.3–15.4)
WBC: 8.4 10*3/uL (ref 3.4–10.8)

## 2018-03-06 LAB — VITAMIN B12: VITAMIN B 12: 393 pg/mL (ref 232–1245)

## 2018-03-06 LAB — INSULIN, RANDOM: INSULIN: 20.3 u[IU]/mL (ref 2.6–24.9)

## 2018-03-06 LAB — TSH: TSH: 1.9 u[IU]/mL (ref 0.450–4.500)

## 2018-03-06 LAB — FOLATE: Folate: 8.3 ng/mL (ref >3.0)

## 2018-03-06 LAB — T4, FREE: Free T4: 1.17 ng/dL (ref 0.82–1.77)

## 2018-03-06 LAB — T3: T3, Total: 118 ng/dL (ref 71–180)

## 2018-03-06 LAB — VITAMIN D 25 HYDROXY (VIT D DEFICIENCY, FRACTURES): Vit D, 25-Hydroxy: 27.9 ng/mL — ABNORMAL LOW (ref 30.0–100.0)

## 2018-03-07 LAB — ANEMIA PANEL
FERRITIN: 47 ng/mL (ref 15–150)
FOLATE, HEMOLYSATE: 342.3 ng/mL
Folate, RBC: 1001 ng/mL (ref >498)
Hematocrit: 34.2 % (ref 34.0–46.6)
IRON: 50 ug/dL (ref 27–159)
Iron Saturation: 16 % (ref 15–55)
RETIC CT PCT: 1.3 % (ref 0.6–2.6)
Total Iron Binding Capacity: 316 ug/dL (ref 250–450)
UIBC: 266 ug/dL (ref 131–425)
VITAMIN B 12: 412 pg/mL (ref 232–1245)

## 2018-03-15 ENCOUNTER — Ambulatory Visit: Payer: No Typology Code available for payment source

## 2018-03-15 MED FILL — VIT D2 1.25 MG (50,000 UNIT: 1.25 MG | 84 days supply | Qty: 12 | Fill #0

## 2018-03-15 MED FILL — DILTIAZEM 24HR ER 180 MG CA: 180 | 90 days supply | Qty: 180 | Fill #0

## 2018-03-15 MED FILL — TRIAMTERENE-HCTZ 37.5-25 MG: 37.5-25 | 90 days supply | Qty: 90 | Fill #0

## 2018-03-19 ENCOUNTER — Ambulatory Visit (INDEPENDENT_AMBULATORY_CARE_PROVIDER_SITE_OTHER): Payer: No Typology Code available for payment source | Admitting: Family Medicine

## 2018-03-20 ENCOUNTER — Ambulatory Visit (INDEPENDENT_AMBULATORY_CARE_PROVIDER_SITE_OTHER): Payer: No Typology Code available for payment source | Admitting: Family Medicine

## 2018-03-20 VITALS — BP 124/70 | HR 68 | Temp 98.2°F | Ht 68.0 in | Wt 244.0 lb

## 2018-03-20 DIAGNOSIS — I10 Essential (primary) hypertension: Secondary | ICD-10-CM

## 2018-03-20 DIAGNOSIS — E559 Vitamin D deficiency, unspecified: Secondary | ICD-10-CM | POA: Diagnosis not present

## 2018-03-20 DIAGNOSIS — Z6837 Body mass index (BMI) 37.0-37.9, adult: Secondary | ICD-10-CM

## 2018-03-20 DIAGNOSIS — R7303 Prediabetes: Secondary | ICD-10-CM | POA: Diagnosis not present

## 2018-03-21 MED FILL — IRBESARTAN 300 MG TAB: 300 | 30 days supply | Qty: 30 | Fill #0

## 2018-03-25 NOTE — Progress Notes (Signed)
Office: [phone]  /  Fax: [phone]   HPI:   Chief Complaint: OBESITY Misty Mahoney is here to discuss her progress with her obesity treatment plan. She is on the Category 2 plan + 100 calories and is following her eating plan approximately 80 % of the time. She states she is exercising 0 minutes 0 times per week. Misty Mahoney liked the structure of meal plan that didn't give a ton of options. Rearranging some at breakfast to eat cottage cheese instead of eggs. Hungry occasionally between lunch and dinner.  Her weight is 244 lb (110.7 kg) today and has not lost weight since her last visit. She has lost 0 lbs since starting treatment with [place].  Vitamin D Deficiency Misty Mahoney has a diagnosis of vitamin D deficiency. She is on 2,000 IU daily OTC and 50,000 IU weekly prescription Vit D. She denies nausea, vomiting or muscle weakness.  Pre-Diabetes Misty Mahoney has a diagnosis of pre-diabetes based on her elevated Hgb A1c and was informed this puts her at greater risk of developing diabetes. Minimal GI side effects after carbohydrate rich meal. She is taking metformin currently and continues to work on diet and exercise to decrease risk of diabetes. She denies nausea or hypoglycemia.  Hypertension Misty Mahoney is a 52 y.o. female with hypertension. Misty Mahoney's blood pressure is controlled today. She denies chest pain. She is working weight loss to help control her blood pressure with the goal of decreasing her risk of heart attack and stroke.  ALLERGIES: Allergies  Allergen Reactions  . Sunflower Oil Swelling    MEDICATIONS: Current Outpatient Medications on File Prior to Visit  Medication Sig Dispense Refill  . aspirin EC 81 MG tablet Take 1 tablet (81 mg total) by mouth daily. 30 tablet 0  . Cholecalciferol (VITAMIN D) 2000 units CAPS Take 1 capsule by mouth daily.    Marland Kitchen diltiazem (CARDIZEM CD) 180 MG 24 hr capsule Take 1 capsule (180 mg total) by mouth 2 (two) times daily. 180 capsule 3  . ergocalciferol  (VITAMIN D2) 50000 units capsule Take 1 capsule (50,000 Units total) by mouth once a week. 12 capsule 1  . escitalopram (LEXAPRO) 10 MG tablet Take 1 tablet (10 mg total) by mouth daily. 90 tablet 1  . flecainide (TAMBOCOR) 150 MG tablet Take 2 tablets (300 mg total) by mouth as needed. 18 tablet 1  . irbesartan (AVAPRO) 300 MG tablet Take 1 tablet (300 mg total) by mouth daily. 90 tablet 1  . Magnesium 250 MG TABS Take 1 tablet by mouth daily.    . metFORMIN (GLUCOPHAGE) 500 MG tablet Take 1 tablet (500 mg total) by mouth daily with breakfast. 180 tablet 3  . Omega-3 Fatty Acids (FISH OIL PO) Take 1 capsule by mouth daily.    Marland Kitchen triamterene-hydrochlorothiazide (MAXZIDE-25) 37.5-25 MG tablet Take 1 tablet by mouth daily. 90 tablet 1   No current facility-administered medications on file prior to visit.     PAST MEDICAL HISTORY: Past Medical History:  Diagnosis Date  . Anemia    gestational  . Atrial fibrillation ([place])   . Chicken pox as a child  . Depression 06/15/2015  . DM, gestational, diet controlled 04/10/2014  . Fatigue   . H/O gestational diabetes mellitus, not currently pregnant 04/10/2014  . History of chicken pox   . Hyperlipidemia 12/20/2015  . Hypertension   . Joint pain   . Obesity, unspecified 04/10/2014  . OSA (obstructive sleep apnea)   . Osteoarthritis   . Palpitations   .  Prediabetes   . Preventative health care 12/26/2015  . SVT (supraventricular tachycardia) ([place])    s/p RFCA 09/26/12  . Vitamin D deficiency 12/26/2015    PAST SURGICAL HISTORY: Past Surgical History:  Procedure Laterality Date  . ADENOIDECTOMY    . CARDIAC ELECTROPHYSIOLOGY STUDY AND ABLATION  2013  . ENDOVENOUS ABLATION SAPHENOUS VEIN W/ LASER Bilateral    Thermal ablation  . KNEE ARTHROSCOPY WITH MENISCAL REPAIR Left 08/25/2016   Procedure: KNEE ARTHROSCOPY WITH partial medial meniscectomy, chondroplasty;  Surgeon: Melrose Nakayama, MD;  Location: [facility];  Service: Orthopedics;   Laterality: Left;  KNEE ARTHROSCOPY WITH partial medial meniscectomy, chondroplasty  . PALATE / UVULA BIOPSY / EXCISION    . TONSILLECTOMY  53 yrs old  . V-TACH ABLATION N/A 09/26/2012   Procedure: V-TACH ABLATION;  Surgeon: Evans Lance, MD;  Location: [facility] CATH LAB;  Service: Cardiovascular;  Laterality: N/A;  . WISDOM TOOTH EXTRACTION  52 yrs old    SOCIAL HISTORY: Social History   Tobacco Use  . Smoking status: Never Smoker  . Smokeless tobacco: Never Used  Substance Use Topics  . Alcohol use: Yes    Comment: two or three times a year  . Drug use: No    FAMILY HISTORY: Family History  Problem Relation Age of Onset  . Hypertension Mother   . Dementia Mother   . Stroke Mother   . Dementia Father   . Hypertension Father   . Cataracts Father        bilateral  . Alzheimer's disease Father   . Cancer Sister 82       metastatic  . Hypertension Brother   . ADD / ADHD Daughter   . ADD / ADHD Son   . Heart attack Maternal Grandfather        pacemaker  . Cancer Maternal Grandfather        ?  Marland Kitchen Cancer Paternal Grandmother        colon    ROS: Review of Systems  Constitutional: Negative for weight loss.  Cardiovascular: Negative for chest pain.  Gastrointestinal: Negative for nausea and vomiting.  Musculoskeletal:       Negative muscle weakness  Endo/Heme/Allergies:       Negative hypoglycemia    PHYSICAL EXAM: Blood pressure 124/70, pulse 68, temperature 98.2 F (36.8 C), height 5\' 8"  (1.727 m), weight 244 lb (110.7 kg), SpO2 97 %. Body mass index is 37.1 kg/m. Physical Exam  Constitutional: She is oriented to person, place, and time. She appears well-developed and well-nourished.  Cardiovascular: Normal rate.  Pulmonary/Chest: Effort normal.  Musculoskeletal: Normal range of motion.  Neurological: She is oriented to person, place, and time.  Skin: Skin is warm and dry.  Psychiatric: She has a normal mood and affect. Her behavior is normal.  Vitals  reviewed.   RECENT LABS AND TESTS: BMET    Component Value Date/Time   NA 141 03/05/2018 0901   K 4.2 03/05/2018 0901   CL 99 03/05/2018 0901   CO2 29 03/05/2018 0901   GLUCOSE 104 (H) 03/05/2018 0901   GLUCOSE 110 (H) 02/05/2018 0951   BUN 23 03/05/2018 0901   CREATININE 0.82 03/05/2018 0901   CREATININE 0.59 06/04/2014 1029   CALCIUM 9.7 03/05/2018 0901   GFRNONAA 83 03/05/2018 0901   GFRAA 95 03/05/2018 0901   Lab Results  Component Value Date   HGBA1C 5.9 (H) 03/05/2018   HGBA1C 6.2 12/13/2017   HGBA1C 5.6 11/07/2016   HGBA1C  5.7 12/20/2015   HGBA1C 5.6 06/15/2015   Lab Results  Component Value Date   INSULIN 20.3 03/05/2018   CBC    Component Value Date/Time   WBC 8.4 03/05/2018 0901   WBC 6.3 12/13/2017 1029   RBC 3.92 03/05/2018 0901   RBC 4.04 12/13/2017 1029   HGB 11.3 03/05/2018 0901   HCT 34.2 03/05/2018 0917   PLT 331 03/05/2018 0901   MCV 89 03/05/2018 0901   MCH 28.8 03/05/2018 0901   MCH 28.6 02/07/2016 1022   MCHC 32.3 03/05/2018 0901   MCHC 32.7 12/13/2017 1029   RDW 13.8 03/05/2018 0901   LYMPHSABS 1.9 03/05/2018 0901   MONOABS 0.3 02/07/2016 1022   EOSABS 0.2 03/05/2018 0901   BASOSABS 0.0 03/05/2018 0901   Iron/TIBC/Ferritin/ %Sat    Component Value Date/Time   IRON 50 03/05/2018 0917   TIBC 316 03/05/2018 0917   FERRITIN 47 03/05/2018 0917   IRONPCTSAT 16 03/05/2018 0917   Lipid Panel     Component Value Date/Time   CHOL 184 03/05/2018 0901   TRIG 87 03/05/2018 0901   HDL 67 03/05/2018 0901   CHOLHDL 2.7 03/05/2018 0901   CHOLHDL 3 12/13/2017 1029   VLDL 13.4 12/13/2017 1029   LDLCALC 100 (H) 03/05/2018 0901   Hepatic Function Panel     Component Value Date/Time   PROT 7.5 03/05/2018 0901   ALBUMIN 4.4 03/05/2018 0901   AST 15 03/05/2018 0901   ALT 14 03/05/2018 0901   ALKPHOS 57 03/05/2018 0901   BILITOT 0.2 03/05/2018 0901   BILIDIR 0.1 06/04/2014 1029   IBILI 0.3 06/04/2014 1029      Component Value Date/Time    TSH 1.900 03/05/2018 0901   TSH 1.19 12/13/2017 1029   TSH 1.00 11/07/2016 0929  Results for SOMARA, FRYMIRE (MRN [id number]) as of 03/25/2018 09:33  Ref. Range 03/05/2018 09:01  Vitamin D, 25-Hydroxy Latest Ref Range: 30.0 - 100.0 ng/mL 27.9 (L)    ASSESSMENT AND PLAN: Vitamin D deficiency  Prediabetes  Essential hypertension  Class 2 severe obesity with serious comorbidity and body mass index (BMI) of 37.0 to 37.9 in adult, unspecified obesity type ([place])  PLAN:  Vitamin D Deficiency Misty Mahoney was informed that low vitamin D levels contributes to fatigue and are associated with obesity, breast, and colon cancer. Misty Mahoney agrees to continue taking current supplementations, no refills needed. She will follow up for routine testing of vitamin D, at least 2-3 times per year. She was informed of the risk of over-replacement of vitamin D and agrees to not increase her dose unless she discusses this with [place] first. Misty Mahoney agrees to follow up with our clinic in 2 weeks.  Pre-Diabetes Misty Mahoney will continue to work on weight loss, exercise, and decreasing simple carbohydrates in her diet to help decrease the risk of diabetes. We dicussed metformin including benefits and risks. She was informed that eating too many simple carbohydrates or too many calories at one sitting increases the likelihood of GI side effects. Misty Mahoney agrees to continue taking metformin 500 mg PO daily. Misty Mahoney agrees to follow up with our clinic in 2 weeks as directed to monitor her progress.  Hypertension We discussed sodium restriction, working on healthy weight loss, and a regular exercise program as the means to achieve improved blood pressure control. Misty Mahoney agreed with this plan and agreed to follow up as directed. We will continue to monitor her blood pressure as well as her progress with the above lifestyle modifications. She will  continue her medications as prescribed and will watch for signs of hypotension as she continues her  lifestyle modifications. Misty Mahoney agrees to follow up with our clinic in 2 weeks.  Obesity Misty Mahoney is currently in the action stage of change. As such, her goal is to continue with weight loss efforts She has agreed to follow the Category 2 plan Misty Mahoney has been instructed to work up to a goal of 150 minutes of combined cardio and strengthening exercise per week for weight loss and overall health benefits. We discussed the following Behavioral Modification Strategies today: increasing lean protein intake, work on meal planning and easy cooking plans, ways to avoid night time snacking, increase H20 intake, better snacking choices, and planning for success   Misty Mahoney has agreed to follow up with our clinic in 2 weeks. She was informed of the importance of frequent follow up visits to maximize her success with intensive lifestyle modifications for her multiple health conditions.   OBESITY BEHAVIORAL INTERVENTION VISIT  Today's visit was # 2 out of 22.  Starting weight: 244 lbs Starting date: 03/05/18 Today's weight : 244 lbs Today's date: 03/20/2018 Total lbs lost to date: 0 (Patients must lose 7 lbs in the first 6 months to continue with counseling)   ASK: We discussed the diagnosis of obesity with Misty Mahoney today and Misty Mahoney agreed to give [place] permission to discuss obesity behavioral modification therapy today.  ASSESS: Misty Mahoney has the diagnosis of obesity and her BMI today is 37.11 Misty Mahoney is in the action stage of change   ADVISE: Misty Mahoney was educated on the multiple health risks of obesity as well as the benefit of weight loss to improve her health. She was advised of the need for long term treatment and the importance of lifestyle modifications.  AGREE: Multiple dietary modification options and treatment options were discussed and  Misty Mahoney agreed to the above obesity treatment plan.  I, Trixie Dredge, am acting as transcriptionist for Ilene Qua, MD  I have reviewed the above  documentation for accuracy and completeness, and I agree with the above. - Ilene Qua, MD

## 2018-03-26 ENCOUNTER — Other Ambulatory Visit: Payer: Self-pay

## 2018-04-08 ENCOUNTER — Other Ambulatory Visit: Payer: Self-pay | Admitting: General Surgery

## 2018-04-08 ENCOUNTER — Ambulatory Visit (INDEPENDENT_AMBULATORY_CARE_PROVIDER_SITE_OTHER): Payer: No Typology Code available for payment source | Admitting: Family Medicine

## 2018-04-08 VITALS — BP 118/69 | HR 68 | Temp 98.3°F | Ht 68.0 in | Wt 235.0 lb

## 2018-04-08 DIAGNOSIS — Z9189 Other specified personal risk factors, not elsewhere classified: Secondary | ICD-10-CM | POA: Diagnosis not present

## 2018-04-08 DIAGNOSIS — Z1231 Encounter for screening mammogram for malignant neoplasm of breast: Secondary | ICD-10-CM

## 2018-04-08 DIAGNOSIS — R7303 Prediabetes: Secondary | ICD-10-CM

## 2018-04-08 DIAGNOSIS — E66812 Obesity, class 2: Secondary | ICD-10-CM

## 2018-04-08 DIAGNOSIS — Z6835 Body mass index (BMI) 35.0-35.9, adult: Secondary | ICD-10-CM | POA: Diagnosis not present

## 2018-04-08 MED ORDER — METFORMIN HCL 500 MG PO TABS: 500.0000 mg | ORAL_TABLET | Freq: Every day | ORAL | 3 refills | Status: DC

## 2018-04-08 NOTE — Progress Notes (Signed)
Office: [phone]  /  Fax: [phone]   HPI:   Chief Complaint: OBESITY Misty Mahoney is here to discuss her progress with her obesity treatment plan. She is on the Category 2 plan and is following her eating plan approximately 90 % of the time. She states she is exercising 0 minutes 0 times per week. Misty Mahoney continues to work hard at [employer] 2 plan. Hunger is mostly controlled and she is getting better at meal planning and prepping, and husband is supportive.  Her weight is 235 lb (106.6 kg) today and has had a weight loss of 9 pounds over a period of 2 to 3 weeks since her last visit. She has lost 9 lbs since starting treatment with [place].  Pre-Diabetes Misty Mahoney has a diagnosis of pre-diabetes based on her elevated Hgb A1c and was informed this puts her at greater risk of developing diabetes. She is doing well on metformin, has had 2 episodes of feeling hypoglycemic and glucose was in the 70's. She notes polyphagia has improved and she continues to work on diet and exercise to decrease risk of diabetes.  At risk for diabetes Misty Mahoney is at higher than average risk for developing diabetes due to her obesity and pre-diabetes. She currently denies polyuria or polydipsia.  ALLERGIES: Allergies  Allergen Reactions  . Sunflower Oil Swelling    MEDICATIONS: Current Outpatient Medications on File Prior to Visit  Medication Sig Dispense Refill  . aspirin EC 81 MG tablet Take 1 tablet (81 mg total) by mouth daily. 30 tablet 0  . Cholecalciferol (VITAMIN D) 2000 units CAPS Take 1 capsule by mouth daily.    Marland Kitchen diltiazem (CARDIZEM CD) 180 MG 24 hr capsule Take 1 capsule (180 mg total) by mouth 2 (two) times daily. 180 capsule 3  . ergocalciferol (VITAMIN D2) 50000 units capsule Take 1 capsule (50,000 Units total) by mouth once a week. 12 capsule 1  . escitalopram (LEXAPRO) 10 MG tablet Take 1 tablet (10 mg total) by mouth daily. 90 tablet 1  . flecainide (TAMBOCOR) 150 MG tablet Take 2 tablets (300 mg  total) by mouth as needed. 18 tablet 1  . irbesartan (AVAPRO) 300 MG tablet Take 1 tablet (300 mg total) by mouth daily. 90 tablet 1  . Magnesium 250 MG TABS Take 1 tablet by mouth daily.    . Omega-3 Fatty Acids (FISH OIL PO) Take 1 capsule by mouth daily.    Marland Kitchen triamterene-hydrochlorothiazide (MAXZIDE-25) 37.5-25 MG tablet Take 1 tablet by mouth daily. 90 tablet 1   No current facility-administered medications on file prior to visit.     PAST MEDICAL HISTORY: Past Medical History:  Diagnosis Date  . Anemia    gestational  . Atrial fibrillation ([place])   . Chicken pox as a child  . Depression 06/15/2015  . DM, gestational, diet controlled 04/10/2014  . Fatigue   . H/O gestational diabetes mellitus, not currently pregnant 04/10/2014  . History of chicken pox   . Hyperlipidemia 12/20/2015  . Hypertension   . Joint pain   . Obesity, unspecified 04/10/2014  . OSA (obstructive sleep apnea)   . Osteoarthritis   . Palpitations   . Prediabetes   . Preventative health care 12/26/2015  . SVT (supraventricular tachycardia) ([place])    s/p RFCA 09/26/12  . Vitamin D deficiency 12/26/2015    PAST SURGICAL HISTORY: Past Surgical History:  Procedure Laterality Date  . ADENOIDECTOMY    . CARDIAC ELECTROPHYSIOLOGY STUDY AND ABLATION  2013  . ENDOVENOUS ABLATION SAPHENOUS  VEIN W/ LASER Bilateral    Thermal ablation  . KNEE ARTHROSCOPY WITH MENISCAL REPAIR Left 08/25/2016   Procedure: KNEE ARTHROSCOPY WITH partial medial meniscectomy, chondroplasty;  Surgeon: Melrose Nakayama, MD;  Location: [place];  Service: Orthopedics;  Laterality: Left;  KNEE ARTHROSCOPY WITH partial medial meniscectomy, chondroplasty  . PALATE / UVULA BIOPSY / EXCISION    . TONSILLECTOMY  52 yrs old  . V-TACH ABLATION N/A 09/26/2012   Procedure: V-TACH ABLATION;  Surgeon: Evans Lance, MD;  Location: [facility] CATH LAB;  Service: Cardiovascular;  Laterality: N/A;  . WISDOM TOOTH EXTRACTION  52 yrs old    SOCIAL  HISTORY: Social History   Tobacco Use  . Smoking status: Never Smoker  . Smokeless tobacco: Never Used  Substance Use Topics  . Alcohol use: Yes    Comment: two or three times a year  . Drug use: No    FAMILY HISTORY: Family History  Problem Relation Age of Onset  . Hypertension Mother   . Dementia Mother   . Stroke Mother   . Dementia Father   . Hypertension Father   . Cataracts Father        bilateral  . Alzheimer's disease Father   . Cancer Sister 58       metastatic  . Hypertension Brother   . ADD / ADHD Daughter   . ADD / ADHD Son   . Heart attack Maternal Grandfather        pacemaker  . Cancer Maternal Grandfather        ?  Marland Kitchen Cancer Paternal Grandmother        colon    ROS: Review of Systems  Constitutional: Positive for weight loss.  Genitourinary: Negative for frequency.  Endo/Heme/Allergies: Negative for polydipsia.       Positive hypoglycemia Positive polyphagia    PHYSICAL EXAM: Blood pressure 118/69, pulse 68, temperature 98.3 F (36.8 C), temperature source Oral, height 5\' 8"  (1.727 m), weight 235 lb (106.6 kg), SpO2 97 %. Body mass index is 35.73 kg/m. Physical Exam  Constitutional: She is oriented to person, place, and time. She appears well-developed and well-nourished.  Cardiovascular: Normal rate.  Pulmonary/Chest: Effort normal.  Musculoskeletal: Normal range of motion.  Neurological: She is oriented to person, place, and time.  Skin: Skin is warm and dry.  Psychiatric: She has a normal mood and affect. Her behavior is normal.  Vitals reviewed.   RECENT LABS AND TESTS: BMET    Component Value Date/Time   NA 141 03/05/2018 0901   K 4.2 03/05/2018 0901   CL 99 03/05/2018 0901   CO2 29 03/05/2018 0901   GLUCOSE 104 (H) 03/05/2018 0901   GLUCOSE 110 (H) 02/05/2018 0951   BUN 23 03/05/2018 0901   CREATININE 0.82 03/05/2018 0901   CREATININE 0.59 06/04/2014 1029   CALCIUM 9.7 03/05/2018 0901   GFRNONAA 83 03/05/2018 0901   GFRAA  95 03/05/2018 0901   Lab Results  Component Value Date   HGBA1C 5.9 (H) 03/05/2018   HGBA1C 6.2 12/13/2017   HGBA1C 5.6 11/07/2016   HGBA1C 5.7 12/20/2015   HGBA1C 5.6 06/15/2015   Lab Results  Component Value Date   INSULIN 20.3 03/05/2018   CBC    Component Value Date/Time   WBC 8.4 03/05/2018 0901   WBC 6.3 12/13/2017 1029   RBC 3.92 03/05/2018 0901   RBC 4.04 12/13/2017 1029   HGB 11.3 03/05/2018 0901   HCT 34.2 03/05/2018 0917   PLT 331  03/05/2018 0901   MCV 89 03/05/2018 0901   MCH 28.8 03/05/2018 0901   MCH 28.6 02/07/2016 1022   MCHC 32.3 03/05/2018 0901   MCHC 32.7 12/13/2017 1029   RDW 13.8 03/05/2018 0901   LYMPHSABS 1.9 03/05/2018 0901   MONOABS 0.3 02/07/2016 1022   EOSABS 0.2 03/05/2018 0901   BASOSABS 0.0 03/05/2018 0901   Iron/TIBC/Ferritin/ %Sat    Component Value Date/Time   IRON 50 03/05/2018 0917   TIBC 316 03/05/2018 0917   FERRITIN 47 03/05/2018 0917   IRONPCTSAT 16 03/05/2018 0917   Lipid Panel     Component Value Date/Time   CHOL 184 03/05/2018 0901   TRIG 87 03/05/2018 0901   HDL 67 03/05/2018 0901   CHOLHDL 2.7 03/05/2018 0901   CHOLHDL 3 12/13/2017 1029   VLDL 13.4 12/13/2017 1029   LDLCALC 100 (H) 03/05/2018 0901   Hepatic Function Panel     Component Value Date/Time   PROT 7.5 03/05/2018 0901   ALBUMIN 4.4 03/05/2018 0901   AST 15 03/05/2018 0901   ALT 14 03/05/2018 0901   ALKPHOS 57 03/05/2018 0901   BILITOT 0.2 03/05/2018 0901   BILIDIR 0.1 06/04/2014 1029   IBILI 0.3 06/04/2014 1029      Component Value Date/Time   TSH 1.900 03/05/2018 0901   TSH 1.19 12/13/2017 1029   TSH 1.00 11/07/2016 0929    ASSESSMENT AND PLAN: Prediabetes - Plan: metFORMIN (GLUCOPHAGE) 500 MG tablet  At risk for diabetes mellitus  Class 2 severe obesity with serious comorbidity and body mass index (BMI) of 35.0 to 35.9 in adult, unspecified obesity type ([place])  PLAN:  Pre-Diabetes Misty Mahoney will continue to work on weight loss,  exercise, and decreasing simple carbohydrates in her diet to help decrease the risk of diabetes. We dicussed metformin including benefits and risks. She was informed that eating too many simple carbohydrates or too many calories at one sitting increases the likelihood of GI side effects. Amila agrees to continue taking metformin 500 mg q AM #30 and we will refill for 1 month. We will recheck labs in 2 months and Misty Mahoney agrees to follow up with our clinic in 2 to 3 weeks as directed to monitor her progress.  Diabetes risk counselling Misty Mahoney was given extended (15 minutes) diabetes prevention counseling today. She is 52 y.o. female and has risk factors for diabetes including obesity and pre-diabetes. We discussed intensive lifestyle modifications today with an emphasis on weight loss as well as increasing exercise and decreasing simple carbohydrates in her diet.  Obesity Misty Mahoney is currently in the action stage of change. As such, her goal is to continue with weight loss efforts She has agreed to follow the Category 2 plan Misty Mahoney has been instructed to work up to a goal of 150 minutes of combined cardio and strengthening exercise per week for weight loss and overall health benefits. We discussed the following Behavioral Modification Strategies today: increasing lean protein intake and decreasing simple carbohydrates    Misty Mahoney has agreed to follow up with our clinic in 2 to 3 weeks. She was informed of the importance of frequent follow up visits to maximize her success with intensive lifestyle modifications for her multiple health conditions.   OBESITY BEHAVIORAL INTERVENTION VISIT  Today's visit was # 3 out of 22.  Starting weight: 244 lbs Starting date: 03/05/18 Today's weight : 235 lbs  Today's date: 04/08/2018 Total lbs lost to date: 9 (Patients must lose 7 lbs in the first 6 months to  continue with counseling)   ASK: We discussed the diagnosis of obesity with Misty Mahoney today and  Misty Mahoney agreed to give [place] permission to discuss obesity behavioral modification therapy today.  ASSESS: Misty Mahoney has the diagnosis of obesity and her BMI today is 35.74 Misty Mahoney is in the action stage of change   ADVISE: Misty Mahoney was educated on the multiple health risks of obesity as well as the benefit of weight loss to improve her health. She was advised of the need for long term treatment and the importance of lifestyle modifications.  AGREE: Multiple dietary modification options and treatment options were discussed and  Misty Mahoney agreed to the above obesity treatment plan.  Wilhemena Durie, am acting as transcriptionist for Dennard Nip, MD

## 2018-04-24 MED FILL — IRBESARTAN 300 MG TABLET: 300 | 30 days supply | Qty: 30 | Fill #1

## 2018-05-01 ENCOUNTER — Ambulatory Visit (INDEPENDENT_AMBULATORY_CARE_PROVIDER_SITE_OTHER): Payer: No Typology Code available for payment source | Admitting: Family Medicine

## 2018-05-01 VITALS — BP 137/69 | HR 60 | Temp 98.2°F | Ht 68.0 in | Wt 238.0 lb

## 2018-05-01 DIAGNOSIS — Z6836 Body mass index (BMI) 36.0-36.9, adult: Secondary | ICD-10-CM

## 2018-05-01 DIAGNOSIS — E559 Vitamin D deficiency, unspecified: Secondary | ICD-10-CM | POA: Diagnosis not present

## 2018-05-01 DIAGNOSIS — I10 Essential (primary) hypertension: Secondary | ICD-10-CM

## 2018-05-01 DIAGNOSIS — R7303 Prediabetes: Secondary | ICD-10-CM

## 2018-05-01 MED ORDER — ERGOCALCIFEROL 1.25 MG (50000 UT) PO CAPS: 50000.0000 [IU] | ORAL_CAPSULE | ORAL | 1 refills | Status: DC

## 2018-05-01 MED ORDER — METFORMIN HCL 500 MG PO TABS: 500.0000 mg | ORAL_TABLET | Freq: Two times a day (BID) | ORAL | 0 refills | Status: DC

## 2018-05-01 MED FILL — metFORMIN HCL 500 MG TABS: 500 | 30 days supply | Qty: 60 | Fill #0

## 2018-05-01 MED FILL — ESCITALOPRAM 10 MG TABLET: 10 | 90 days supply | Qty: 90 | Fill #0

## 2018-05-01 NOTE — Progress Notes (Signed)
Office: [phone]  /  Fax: [phone]   HPI:   Chief Complaint: OBESITY Misty Mahoney is here to discuss her progress with her obesity treatment plan. She is on the Category 2 plan and is following her eating plan approximately 60 % of the time. She states she is doing yoga for 75 minutes 2 times per week. Misty Mahoney had increased celebration eating on vacation and with graduations. She has already started to get back on track. Her weight is 238 lb (108 kg) today and has had a weight gain of 3 pounds over a period of 3 weeks since her last visit. She has lost 6 lbs since starting treatment with [place].  Vitamin D deficiency Misty Mahoney has a diagnosis of vitamin D deficiency. Misty Mahoney is stable on vit D, but is not yet at goal. She denies nausea, vomiting or muscle weakness.  Pre-Diabetes Misty Mahoney has a diagnosis of prediabetes based on her elevated Hgb A1c and was informed this puts her at greater risk of developing diabetes. Misty Mahoney is off track with increased celebration eating and may be having increased PM polyphagia. She is taking metformin currently and continues to work on diet and exercise to decrease risk of diabetes. She denies nausea, vomiting or hypoglycemia.  Hypertension Misty Mahoney is a 52 y.o. female with hypertension and is on multiple medications. Her blood pressure is stable, but is slightly elevated. This may be due to concern about weight gain.  Misty Mahoney denies chest pain. She is working weight loss to help control her blood pressure with the goal of decreasing her risk of heart attack and stroke. Misty Mahoney blood pressure is not currently controlled.  ALLERGIES: Allergies  Allergen Reactions  . Sunflower Oil Swelling    MEDICATIONS: Current Outpatient Medications on File Prior to Visit  Medication Sig Dispense Refill  . aspirin EC 81 MG tablet Take 1 tablet (81 mg total) by mouth daily. 30 tablet 0  . Cholecalciferol (VITAMIN D) 2000 units CAPS Take 1 capsule by  mouth daily.    Marland Kitchen diltiazem (CARDIZEM CD) 180 MG 24 hr capsule Take 1 capsule (180 mg total) by mouth 2 (two) times daily. 180 capsule 3  . escitalopram (LEXAPRO) 10 MG tablet Take 1 tablet (10 mg total) by mouth daily. 90 tablet 1  . flecainide (TAMBOCOR) 150 MG tablet Take 2 tablets (300 mg total) by mouth as needed. 18 tablet 1  . irbesartan (AVAPRO) 300 MG tablet Take 1 tablet (300 mg total) by mouth daily. 90 tablet 1  . Magnesium 250 MG TABS Take 1 tablet by mouth daily.    . Omega-3 Fatty Acids (FISH OIL PO) Take 1 capsule by mouth daily.    Marland Kitchen triamterene-hydrochlorothiazide (MAXZIDE-25) 37.5-25 MG tablet Take 1 tablet by mouth daily. 90 tablet 1   No current facility-administered medications on file prior to visit.     PAST MEDICAL HISTORY: Past Medical History:  Diagnosis Date  . Anemia    gestational  . Atrial fibrillation ([place])   . Chicken pox as a child  . Depression 06/15/2015  . DM, gestational, diet controlled 04/10/2014  . Fatigue   . H/O gestational diabetes mellitus, not currently pregnant 04/10/2014  . History of chicken pox   . Hyperlipidemia 12/20/2015  . Hypertension   . Joint pain   . Obesity, unspecified 04/10/2014  . OSA (obstructive sleep apnea)   . Osteoarthritis   . Palpitations   . Prediabetes   . Preventative health care 12/26/2015  . SVT (supraventricular  tachycardia) ([place])    s/p RFCA 09/26/12  . Vitamin D deficiency 12/26/2015    PAST SURGICAL HISTORY: Past Surgical History:  Procedure Laterality Date  . ADENOIDECTOMY    . CARDIAC ELECTROPHYSIOLOGY STUDY AND ABLATION  2013  . ENDOVENOUS ABLATION SAPHENOUS VEIN W/ LASER Bilateral    Thermal ablation  . KNEE ARTHROSCOPY WITH MENISCAL REPAIR Left 08/25/2016   Procedure: KNEE ARTHROSCOPY WITH partial medial meniscectomy, chondroplasty;  Surgeon: Melrose Nakayama, MD;  Location: [facility];  Service: Orthopedics;  Laterality: Left;  KNEE ARTHROSCOPY WITH partial medial meniscectomy,  chondroplasty  . PALATE / UVULA BIOPSY / EXCISION    . TONSILLECTOMY  52 yrs old  . V-TACH ABLATION N/A 09/26/2012   Procedure: V-TACH ABLATION;  Surgeon: Evans Lance, MD;  Location: [facility] CATH LAB;  Service: Cardiovascular;  Laterality: N/A;  . WISDOM TOOTH EXTRACTION  52 yrs old    SOCIAL HISTORY: Social History   Tobacco Use  . Smoking status: Never Smoker  . Smokeless tobacco: Never Used  Substance Use Topics  . Alcohol use: Yes    Comment: two or three times a year  . Drug use: No    FAMILY HISTORY: Family History  Problem Relation Age of Onset  . Hypertension Mother   . Dementia Mother   . Stroke Mother   . Dementia Father   . Hypertension Father   . Cataracts Father        bilateral  . Alzheimer's disease Father   . Cancer Sister 2       metastatic  . Hypertension Brother   . ADD / ADHD Daughter   . ADD / ADHD Son   . Heart attack Maternal Grandfather        pacemaker  . Cancer Maternal Grandfather        ?  Marland Kitchen Cancer Paternal Grandmother        colon    ROS: Review of Systems  Constitutional: Negative for weight loss.  Cardiovascular: Negative for chest pain.  Gastrointestinal: Negative for nausea and vomiting.  Musculoskeletal:       Negative for muscle weakness  Endo/Heme/Allergies:       Positive for polyphagia Negative for hypoglycemia    PHYSICAL EXAM: Blood pressure 137/69, pulse 60, temperature 98.2 F (36.8 C), temperature source Oral, height 5\' 8"  (1.727 m), weight 238 lb (108 kg), SpO2 98 %. Body mass index is 36.19 kg/m. Physical Exam  Constitutional: She is oriented to person, place, and time. She appears well-developed and well-nourished.  Cardiovascular: Normal rate.  Pulmonary/Chest: Effort normal.  Musculoskeletal: Normal range of motion.  Neurological: She is oriented to person, place, and time.  Skin: Skin is warm and dry.  Psychiatric: She has a normal mood and affect. Her behavior is normal.  Vitals reviewed.   RECENT  LABS AND TESTS: BMET    Component Value Date/Time   NA 141 03/05/2018 0901   K 4.2 03/05/2018 0901   CL 99 03/05/2018 0901   CO2 29 03/05/2018 0901   GLUCOSE 104 (H) 03/05/2018 0901   GLUCOSE 110 (H) 02/05/2018 0951   BUN 23 03/05/2018 0901   CREATININE 0.82 03/05/2018 0901   CREATININE 0.59 06/04/2014 1029   CALCIUM 9.7 03/05/2018 0901   GFRNONAA 83 03/05/2018 0901   GFRAA 95 03/05/2018 0901   Lab Results  Component Value Date   HGBA1C 5.9 (H) 03/05/2018   HGBA1C 6.2 12/13/2017   HGBA1C 5.6 11/07/2016   HGBA1C 5.7 12/20/2015  HGBA1C 5.6 06/15/2015   Lab Results  Component Value Date   INSULIN 20.3 03/05/2018   CBC    Component Value Date/Time   WBC 8.4 03/05/2018 0901   WBC 6.3 12/13/2017 1029   RBC 3.92 03/05/2018 0901   RBC 4.04 12/13/2017 1029   HGB 11.3 03/05/2018 0901   HCT 34.2 03/05/2018 0917   PLT 331 03/05/2018 0901   MCV 89 03/05/2018 0901   MCH 28.8 03/05/2018 0901   MCH 28.6 02/07/2016 1022   MCHC 32.3 03/05/2018 0901   MCHC 32.7 12/13/2017 1029   RDW 13.8 03/05/2018 0901   LYMPHSABS 1.9 03/05/2018 0901   MONOABS 0.3 02/07/2016 1022   EOSABS 0.2 03/05/2018 0901   BASOSABS 0.0 03/05/2018 0901   Iron/TIBC/Ferritin/ %Sat    Component Value Date/Time   IRON 50 03/05/2018 0917   TIBC 316 03/05/2018 0917   FERRITIN 47 03/05/2018 0917   IRONPCTSAT 16 03/05/2018 0917   Lipid Panel     Component Value Date/Time   CHOL 184 03/05/2018 0901   TRIG 87 03/05/2018 0901   HDL 67 03/05/2018 0901   CHOLHDL 2.7 03/05/2018 0901   CHOLHDL 3 12/13/2017 1029   VLDL 13.4 12/13/2017 1029   LDLCALC 100 (H) 03/05/2018 0901   Hepatic Function Panel     Component Value Date/Time   PROT 7.5 03/05/2018 0901   ALBUMIN 4.4 03/05/2018 0901   AST 15 03/05/2018 0901   ALT 14 03/05/2018 0901   ALKPHOS 57 03/05/2018 0901   BILITOT 0.2 03/05/2018 0901   BILIDIR 0.1 06/04/2014 1029   IBILI 0.3 06/04/2014 1029      Component Value Date/Time   TSH 1.900  03/05/2018 0901   TSH 1.19 12/13/2017 1029   TSH 1.00 11/07/2016 0929   Results for JATORIA, KNEELAND (MRN [id number]) as of 05/01/2018 11:10  Ref. Range 03/05/2018 09:01  Vitamin D, 25-Hydroxy Latest Ref Range: 30.0 - 100.0 ng/mL 27.9 (L)   ASSESSMENT AND PLAN: Prediabetes - Plan: metFORMIN (GLUCOPHAGE) 500 MG tablet  Vitamin D deficiency - Plan: ergocalciferol (VITAMIN D2) 50000 units capsule  Essential hypertension  Class 2 severe obesity with serious comorbidity and body mass index (BMI) of 36.0 to 36.9 in adult, unspecified obesity type ([place])  PLAN:  Vitamin D Deficiency Amarya was informed that low vitamin D levels contributes to fatigue and are associated with obesity, breast, and colon cancer. She agrees to continue to take prescription Vit D @50 ,000 IU every week. We will refill for 1 month and she will follow up for routine testing of vitamin D, at least 2-3 times per year. She was informed of the risk of over-replacement of vitamin D and agrees to not increase her dose unless she discusses this with [place] first. Ayelen agrees to follow up as directed.  Pre-Diabetes Dariann will continue to work on weight loss, exercise, and decreasing simple carbohydrates in her diet to help decrease the risk of diabetes. We dicussed metformin including benefits and risks. She was informed that eating too many simple carbohydrates or too many calories at one sitting increases the likelihood of GI side effects. Blaine agrees to increase metformin to 500 mg BID #60 with no refills and follow up with [place] as directed to monitor her progress.  Hypertension We discussed sodium restriction, working on healthy weight loss, and a regular exercise program as the means to achieve improved blood pressure control. Layali agreed with this plan and to get back to diet. Ginnie agreed to follow up as directed.  We will recheck blood pressure in 2 to 3 weeks and will continue to monitor her blood pressure as well as her  progress with the above lifestyle modifications. She will continue her medications as prescribed and will watch for signs of hypotension as she continues her lifestyle modifications.  Obesity Calisha is currently in the action stage of change. As such, her goal is to continue with weight loss efforts She has agreed to follow the Category 2 plan Marise has been instructed to work up to a goal of 150 minutes of combined cardio and strengthening exercise per week for weight loss and overall health benefits. We discussed the following Behavioral Modification Strategies today: increasing lean protein intake, travel eating strategies and holiday eating strategies   Yanilen has agreed to follow up with our clinic in 2 to 3 weeks. She was informed of the importance of frequent follow up visits to maximize her success with intensive lifestyle modifications for her multiple health conditions.   OBESITY BEHAVIORAL INTERVENTION VISIT  Today's visit was # 4 out of 22.  Starting weight: 244 lbs Starting date: 03/05/18 Today's weight : 238 lbs  Today's date: 05/01/2018 Total lbs lost to date: 6 (Patients must lose 7 lbs in the first 6 months to continue with counseling)   ASK: We discussed the diagnosis of obesity with Misty Mahoney today and Verena agreed to give [place] permission to discuss obesity behavioral modification therapy today.  ASSESS: Curtistine has the diagnosis of obesity and her BMI today is 36.2 Hailyn is in the action stage of change   ADVISE: Fortune was educated on the multiple health risks of obesity as well as the benefit of weight loss to improve her health. She was advised of the need for long term treatment and the importance of lifestyle modifications.  AGREE: Multiple dietary modification options and treatment options were discussed and  Jean agreed to the above obesity treatment plan.  I, Doreene Nest, am acting as transcriptionist for Dennard Nip, MD  I have reviewed the  above documentation for accuracy and completeness, and I agree with the above. -Dennard Nip, MD

## 2018-05-02 ENCOUNTER — Ambulatory Visit
Admission: RE | Admit: 2018-05-02 | Discharge: 2018-05-02 | Disposition: A | Discharge status: Home / Self Care | Payer: No Typology Code available for payment source | Source: Ambulatory Visit | Attending: General Surgery | Admitting: General Surgery

## 2018-05-02 ENCOUNTER — Ambulatory Visit: Payer: No Typology Code available for payment source

## 2018-05-02 DIAGNOSIS — Z1231 Encounter for screening mammogram for malignant neoplasm of breast: Secondary | ICD-10-CM

## 2018-05-15 ENCOUNTER — Ambulatory Visit (INDEPENDENT_AMBULATORY_CARE_PROVIDER_SITE_OTHER): Payer: No Typology Code available for payment source | Admitting: Family Medicine

## 2018-05-16 ENCOUNTER — Ambulatory Visit (INDEPENDENT_AMBULATORY_CARE_PROVIDER_SITE_OTHER): Payer: No Typology Code available for payment source | Admitting: Family Medicine

## 2018-05-16 VITALS — BP 145/81 | HR 68 | Temp 97.6°F | Ht 68.0 in | Wt 238.0 lb

## 2018-05-16 DIAGNOSIS — I10 Essential (primary) hypertension: Secondary | ICD-10-CM

## 2018-05-16 DIAGNOSIS — Z6836 Body mass index (BMI) 36.0-36.9, adult: Secondary | ICD-10-CM | POA: Diagnosis not present

## 2018-05-16 NOTE — Progress Notes (Signed)
Office: [phone]  /  Fax: [phone]   HPI:   Chief Complaint: OBESITY Misty Mahoney is here to discuss her progress with her obesity treatment plan. She is on the Category 2 plan and is following her eating plan approximately 75 % of the time. She states she is exercising 0 minutes 0 times per week. Misty Mahoney has done well with maintaining weight. She is going on vacation soon but has plans on how to control celebration eating.  Her weight is 238 lb (108 kg) today and has not lost weight since her last visit. She has lost 6 lbs since starting treatment with [place].  Hypertension Misty Mahoney is a 52 y.o. female with hypertension. Misty Mahoney's blood pressure is elevated today, normally well controlled. Just had a stressful conversation before our visit, stable on medications and she denies chest pain. She is working weight loss to help control her blood pressure with the goal of decreasing her risk of heart attack and stroke. Misty Mahoney blood pressure is not currently controlled.  ALLERGIES: Allergies  Allergen Reactions  . Sunflower Oil Swelling    MEDICATIONS: Current Outpatient Medications on File Prior to Visit  Medication Sig Dispense Refill  . aspirin EC 81 MG tablet Take 1 tablet (81 mg total) by mouth daily. 30 tablet 0  . Cholecalciferol (VITAMIN D) 2000 units CAPS Take 1 capsule by mouth daily.    Marland Kitchen diltiazem (CARDIZEM CD) 180 MG 24 hr capsule Take 1 capsule (180 mg total) by mouth 2 (two) times daily. 180 capsule 3  . ergocalciferol (VITAMIN D2) 50000 units capsule Take 1 capsule (50,000 Units total) by mouth once a week. 12 capsule 1  . escitalopram (LEXAPRO) 10 MG tablet Take 1 tablet (10 mg total) by mouth daily. 90 tablet 1  . flecainide (TAMBOCOR) 150 MG tablet Take 2 tablets (300 mg total) by mouth as needed. 18 tablet 1  . irbesartan (AVAPRO) 300 MG tablet Take 1 tablet (300 mg total) by mouth daily. 90 tablet 1  . Magnesium 250 MG TABS Take 1 tablet by mouth daily.    .  metFORMIN (GLUCOPHAGE) 500 MG tablet Take 1 tablet (500 mg total) by mouth 2 (two) times daily with a meal. 60 tablet 0  . Omega-3 Fatty Acids (FISH OIL PO) Take 1 capsule by mouth daily.    Marland Kitchen triamterene-hydrochlorothiazide (MAXZIDE-25) 37.5-25 MG tablet Take 1 tablet by mouth daily. 90 tablet 1   No current facility-administered medications on file prior to visit.     PAST MEDICAL HISTORY: Past Medical History:  Diagnosis Date  . Anemia    gestational  . Atrial fibrillation ([place])   . Chicken pox as a child  . Depression 06/15/2015  . DM, gestational, diet controlled 04/10/2014  . Fatigue   . H/O gestational diabetes mellitus, not currently pregnant 04/10/2014  . History of chicken pox   . Hyperlipidemia 12/20/2015  . Hypertension   . Joint pain   . Obesity, unspecified 04/10/2014  . OSA (obstructive sleep apnea)   . Osteoarthritis   . Palpitations   . Prediabetes   . Preventative health care 12/26/2015  . SVT (supraventricular tachycardia) ([place])    s/p RFCA 09/26/12  . Vitamin D deficiency 12/26/2015    PAST SURGICAL HISTORY: Past Surgical History:  Procedure Laterality Date  . ADENOIDECTOMY    . CARDIAC ELECTROPHYSIOLOGY STUDY AND ABLATION  2013  . ENDOVENOUS ABLATION SAPHENOUS VEIN W/ LASER Bilateral    Thermal ablation  . KNEE ARTHROSCOPY WITH MENISCAL REPAIR  Left 08/25/2016   Procedure: KNEE ARTHROSCOPY WITH partial medial meniscectomy, chondroplasty;  Surgeon: Melrose Nakayama, MD;  Location: [facility];  Service: Orthopedics;  Laterality: Left;  KNEE ARTHROSCOPY WITH partial medial meniscectomy, chondroplasty  . PALATE / UVULA BIOPSY / EXCISION    . TONSILLECTOMY  52 yrs old  . V-TACH ABLATION N/A 09/26/2012   Procedure: V-TACH ABLATION;  Surgeon: Evans Lance, MD;  Location[facility] CATH LAB;  Service: Cardiovascular;  Laterality: N/A;  . WISDOM TOOTH EXTRACTION  52 yrs old    SOCIAL HISTORY: Social History   Tobacco Use  . Smoking status: Never Smoker  .  Smokeless tobacco: Never Used  Substance Use Topics  . Alcohol use: Yes    Comment: two or three times a year  . Drug use: No    FAMILY HISTORY: Family History  Problem Relation Age of Onset  . Hypertension Mother   . Dementia Mother   . Stroke Mother   . Dementia Father   . Hypertension Father   . Cataracts Father        bilateral  . Alzheimer's disease Father   . Cancer Sister 86       metastatic  . Hypertension Brother   . ADD / ADHD Daughter   . ADD / ADHD Son   . Heart attack Maternal Grandfather        pacemaker  . Cancer Maternal Grandfather        ?  Marland Kitchen Cancer Paternal Grandmother        colon    ROS: Review of Systems  Constitutional: Negative for weight loss.  Cardiovascular: Negative for chest pain.    PHYSICAL EXAM: Blood pressure (!) 145/81, pulse 68, temperature 97.6 F (36.4 C), temperature source Oral, height 5\' 8"  (1.727 m), weight 238 lb (108 kg), SpO2 96 %. Body mass index is 36.19 kg/m. Physical Exam  Constitutional: She is oriented to person, place, and time. She appears well-developed and well-nourished.  Cardiovascular: Normal rate.  Pulmonary/Chest: Effort normal.  Musculoskeletal: Normal range of motion.  Neurological: She is oriented to person, place, and time.  Skin: Skin is warm and dry.  Psychiatric: She has a normal mood and affect. Her behavior is normal.  Vitals reviewed.   RECENT LABS AND TESTS: BMET    Component Value Date/Time   NA 141 03/05/2018 0901   K 4.2 03/05/2018 0901   CL 99 03/05/2018 0901   CO2 29 03/05/2018 0901   GLUCOSE 104 (H) 03/05/2018 0901   GLUCOSE 110 (H) 02/05/2018 0951   BUN 23 03/05/2018 0901   CREATININE 0.82 03/05/2018 0901   CREATININE 0.59 06/04/2014 1029   CALCIUM 9.7 03/05/2018 0901   GFRNONAA 83 03/05/2018 0901   GFRAA 95 03/05/2018 0901   Lab Results  Component Value Date   HGBA1C 5.9 (H) 03/05/2018   HGBA1C 6.2 12/13/2017   HGBA1C 5.6 11/07/2016   HGBA1C 5.7 12/20/2015   HGBA1C  5.6 06/15/2015   Lab Results  Component Value Date   INSULIN 20.3 03/05/2018   CBC    Component Value Date/Time   WBC 8.4 03/05/2018 0901   WBC 6.3 12/13/2017 1029   RBC 3.92 03/05/2018 0901   RBC 4.04 12/13/2017 1029   HGB 11.3 03/05/2018 0901   HCT 34.2 03/05/2018 0917   PLT 331 03/05/2018 0901   MCV 89 03/05/2018 0901   MCH 28.8 03/05/2018 0901   MCH 28.6 02/07/2016 1022   MCHC 32.3 03/05/2018 0901   MCHC  32.7 12/13/2017 1029   RDW 13.8 03/05/2018 0901   LYMPHSABS 1.9 03/05/2018 0901   MONOABS 0.3 02/07/2016 1022   EOSABS 0.2 03/05/2018 0901   BASOSABS 0.0 03/05/2018 0901   Iron/TIBC/Ferritin/ %Sat    Component Value Date/Time   IRON 50 03/05/2018 0917   TIBC 316 03/05/2018 0917   FERRITIN 47 03/05/2018 0917   IRONPCTSAT 16 03/05/2018 0917   Lipid Panel     Component Value Date/Time   CHOL 184 03/05/2018 0901   TRIG 87 03/05/2018 0901   HDL 67 03/05/2018 0901   CHOLHDL 2.7 03/05/2018 0901   CHOLHDL 3 12/13/2017 1029   VLDL 13.4 12/13/2017 1029   LDLCALC 100 (H) 03/05/2018 0901   Hepatic Function Panel     Component Value Date/Time   PROT 7.5 03/05/2018 0901   ALBUMIN 4.4 03/05/2018 0901   AST 15 03/05/2018 0901   ALT 14 03/05/2018 0901   ALKPHOS 57 03/05/2018 0901   BILITOT 0.2 03/05/2018 0901   BILIDIR 0.1 06/04/2014 1029   IBILI 0.3 06/04/2014 1029      Component Value Date/Time   TSH 1.900 03/05/2018 0901   TSH 1.19 12/13/2017 1029   TSH 1.00 11/07/2016 0929    ASSESSMENT AND PLAN: Essential hypertension  Class 2 severe obesity with serious comorbidity and body mass index (BMI) of 36.0 to 36.9 in adult, unspecified obesity type ([facility])  PLAN:  Hypertension We discussed sodium restriction, working on healthy weight loss, and a regular exercise program as the means to achieve improved blood pressure control. Misty Mahoney with this plan and Mahoney to follow Mahoney as directed. We will continue to monitor her blood pressure as well as her progress  with the above lifestyle modifications. She will continue her medications as is and will watch for signs of hypotension as she continues her lifestyle modifications. Misty Mahoney in 3 weeks and we will recheck blood pressure at that time.  We spent > than 50% of the 15 minute visit on the counseling as documented in the note.  Obesity Misty Mahoney is currently in the action stage of change. As such, her goal is to continue with weight loss efforts She has Mahoney to follow the Category 2 plan Misty Mahoney has been instructed to work Mahoney to a goal of 150 minutes of combined cardio and strengthening exercise per week for weight loss and overall health benefits. We discussed the following Behavioral Modification Strategies today: increasing lean protein intake, decreasing simple carbohydrates, and travel eating strategies    Misty Mahoney in 3 weeks. She was informed of the importance of frequent follow Mahoney visits to maximize her success with intensive lifestyle modifications for her multiple health conditions.   OBESITY BEHAVIORAL INTERVENTION VISIT  Today's visit was # 5 out of 22.  Starting weight: 244 lbs Starting date: 03/05/18 Today's weight : 238 lbs Today's date: 05/16/2018 Total lbs lost to date: 6 (Patients must lose 7 lbs in the first 6 months to continue with counseling)   ASK: We discussed the diagnosis of obesity with [name] today and Asal Mahoney to give [place] permission to discuss obesity behavioral modification therapy today.  ASSESS: Avaeh has the diagnosis of obesity and her BMI today is 36.2 Jamera is in the action stage of change   ADVISE: Olene was educated on the multiple health risks of obesity as well as the benefit of weight loss to improve her health. She was  advised of the need for long term treatment and the importance of lifestyle modifications.  AGREE: Multiple dietary modification options and  treatment options were discussed and  Sheneika Mahoney to the above obesity treatment plan.  Wilhemena Durie, am acting as transcriptionist for Dennard Nip, MD

## 2018-06-06 MED FILL — IRBESARTAN 300 MG TABLET: 300 | 30 days supply | Qty: 30 | Fill #2

## 2018-06-11 ENCOUNTER — Encounter: Payer: Self-pay | Admitting: Family Medicine

## 2018-06-11 ENCOUNTER — Ambulatory Visit (INDEPENDENT_AMBULATORY_CARE_PROVIDER_SITE_OTHER): Payer: No Typology Code available for payment source | Admitting: Family Medicine

## 2018-06-11 VITALS — BP 136/80 | HR 68 | Temp 98.1°F | Resp 18 | Ht 68.5 in | Wt 243.2 lb

## 2018-06-11 DIAGNOSIS — E559 Vitamin D deficiency, unspecified: Secondary | ICD-10-CM

## 2018-06-11 DIAGNOSIS — E279 Disorder of adrenal gland, unspecified: Secondary | ICD-10-CM | POA: Diagnosis not present

## 2018-06-11 DIAGNOSIS — E6609 Other obesity due to excess calories: Secondary | ICD-10-CM

## 2018-06-11 DIAGNOSIS — E278 Other specified disorders of adrenal gland: Secondary | ICD-10-CM

## 2018-06-11 DIAGNOSIS — E785 Hyperlipidemia, unspecified: Secondary | ICD-10-CM

## 2018-06-11 DIAGNOSIS — Z0000 Encounter for general adult medical examination without abnormal findings: Secondary | ICD-10-CM

## 2018-06-11 DIAGNOSIS — R739 Hyperglycemia, unspecified: Secondary | ICD-10-CM | POA: Diagnosis not present

## 2018-06-11 DIAGNOSIS — I10 Essential (primary) hypertension: Secondary | ICD-10-CM | POA: Diagnosis not present

## 2018-06-11 DIAGNOSIS — D3501 Benign neoplasm of right adrenal gland: Secondary | ICD-10-CM

## 2018-06-11 MED ORDER — TRIAMTERENE-HCTZ 37.5-25 MG PO TABS: 1.0000 | ORAL_TABLET | Freq: Every day | ORAL | 1 refills | Status: DC

## 2018-06-11 MED ORDER — IRBESARTAN 300 MG PO TABS: 300.0000 mg | ORAL_TABLET | Freq: Every day | ORAL | 1 refills | Status: DC

## 2018-06-11 MED ORDER — ESCITALOPRAM OXALATE 10 MG PO TABS: 10.0000 mg | ORAL_TABLET | Freq: Every day | ORAL | 1 refills | Status: DC

## 2018-06-11 NOTE — Assessment & Plan Note (Addendum)
Patient encouraged to maintain heart healthy diet, regular exercise, adequate sleep. Consider daily probiotics. Take medications as prescribed. Labs reviewed with patient. Given and reviewed copy of ACP documents from [employer] and encouraged to complete and return

## 2018-06-11 NOTE — Assessment & Plan Note (Signed)
Encouraged heart healthy diet, increase exercise, avoid trans fats 

## 2018-06-11 NOTE — Patient Instructions (Signed)
Shingrix is the new shingles 2 shots over 2-6 months call insurance regarding payment then call for Nurse appt or scrept to be sent o [facility] Preventive Care 40-64 Years, Female Preventive care refers to lifestyle choices and visits with your health care provider that can promote health and wellness. What does preventive care include?  A yearly physical exam. This is also called an annual well check.  Dental exams once or twice a year.  Routine eye exams. Ask your health care provider how often you should have your eyes checked.  Personal lifestyle choices, including: ? Daily care of your teeth and gums. ? Regular physical activity. ? Eating a healthy diet. ? Avoiding tobacco and drug use. ? Limiting alcohol use. ? Practicing safe sex. ? Taking low-dose aspirin daily starting at age 62. ? Taking vitamin and mineral supplements as recommended by your health care provider. What happens during an annual well check? The services and screenings done by your health care provider during your annual well check will depend on your age, overall health, lifestyle risk factors, and family history of disease. Counseling Your health care provider may ask you questions about your:  Alcohol use.  Tobacco use.  Drug use.  Emotional well-being.  Home and relationship well-being.  Sexual activity.  Eating habits.  Work and work Statistician.  Method of birth control.  Menstrual cycle.  Pregnancy history.  Screening You may have the following tests or measurements:  Height, weight, and BMI.  Blood pressure.  Lipid and cholesterol levels. These may be checked every 5 years, or more frequently if you are over 52 years old.  Skin check.  Lung cancer screening. You may have this screening every year starting at age 10 if you have a 30-pack-year history of smoking and currently smoke or have quit within the past 15 years.  Fecal occult blood test (FOBT) of the stool. You may  have this test every year starting at age 88.  Flexible sigmoidoscopy or colonoscopy. You may have a sigmoidoscopy every 5 years or a colonoscopy every 10 years starting at age 21.  Hepatitis C blood test.  Hepatitis B blood test.  Sexually transmitted disease (STD) testing.  Diabetes screening. This is done by checking your blood sugar (glucose) after you have not eaten for a while (fasting). You may have this done every 1-3 years.  Mammogram. This may be done every 1-2 years. Talk to your health care provider about when you should start having regular mammograms. This may depend on whether you have a family history of breast cancer.  BRCA-related cancer screening. This may be done if you have a family history of breast, ovarian, tubal, or peritoneal cancers.  Pelvic exam and Pap test. This may be done every 3 years starting at age 66. Starting at age 8, this may be done every 5 years if you have a Pap test in combination with an HPV test.  Bone density scan. This is done to screen for osteoporosis. You may have this scan if you are at high risk for osteoporosis.  Discuss your test results, treatment options, and if necessary, the need for more tests with your health care provider. Vaccines Your health care provider may recommend certain vaccines, such as:  Influenza vaccine. This is recommended every year.  Tetanus, diphtheria, and acellular pertussis (Tdap, Td) vaccine. You may need a Td booster every 10 years.  Varicella vaccine. You may need this if you have not been vaccinated.  Zoster vaccine. You  may need this after age 12.  Measles, mumps, and rubella (MMR) vaccine. You may need at least one dose of MMR if you were born in 1957 or later. You may also need a second dose.  Pneumococcal 13-valent conjugate (PCV13) vaccine. You may need this if you have certain conditions and were not previously vaccinated.  Pneumococcal polysaccharide (PPSV23) vaccine. You may need one or  two doses if you smoke cigarettes or if you have certain conditions.  Meningococcal vaccine. You may need this if you have certain conditions.  Hepatitis A vaccine. You may need this if you have certain conditions or if you travel or work in places where you may be exposed to hepatitis A.  Hepatitis B vaccine. You may need this if you have certain conditions or if you travel or work in places where you may be exposed to hepatitis B.  Haemophilus influenzae type b (Hib) vaccine. You may need this if you have certain conditions.  Talk to your health care provider about which screenings and vaccines you need and how often you need them. This information is not intended to replace advice given to you by your health care provider. Make sure you discuss any questions you have with your health care provider. Document Released: 12/03/2015 Document Revised: 07/26/2016 Document Reviewed: 09/07/2015 Elsevier Interactive Patient Education  [employer].

## 2018-06-11 NOTE — Assessment & Plan Note (Signed)
Well controlled, no changes to meds. Encouraged heart healthy diet such as the DASH diet and exercise as tolerated.  °

## 2018-06-11 NOTE — Assessment & Plan Note (Signed)
Supplement and monitor 

## 2018-06-11 NOTE — Assessment & Plan Note (Signed)
hgba1c acceptable, minimize simple carbs. Increase exercise as tolerated.  

## 2018-06-11 NOTE — Assessment & Plan Note (Signed)
Encouraged DASH diet, decrease po intake and increase exercise as tolerated. Needs 7-8 hours of sleep nightly. Avoid trans fats, eat small, frequent meals every 4-5 hours with lean proteins, complex carbs and healthy fats. Minimize simple carbs 

## 2018-06-16 NOTE — Progress Notes (Signed)
Subjective:    Patient ID: Misty Mahoney, female    DOB: 10-03-1966, 52 y.o.   MRN: [id number]  No chief complaint on file.   HPI Patient is in today for annual preventative exam. Feels well today. No recent febrile illness or hospitalizations. Is trying to maintain a heart healthy diet and is trying to be more active. Is not always successful. She has changed her job and is very happy with her decreased stress. Denies CP/palp/SOB/HA/congestion/fevers/GI or GU c/o. Taking meds as prescribed  Past Medical History:  Diagnosis Date  . Anemia    gestational  . Atrial fibrillation ([place])   . Chicken pox as a child  . Depression 06/15/2015  . DM, gestational, diet controlled 04/10/2014  . Fatigue   . H/O gestational diabetes mellitus, not currently pregnant 04/10/2014  . History of chicken pox   . Hyperlipidemia 12/20/2015  . Hypertension   . Joint pain   . Obesity, unspecified 04/10/2014  . OSA (obstructive sleep apnea)   . Osteoarthritis   . Palpitations   . Prediabetes   . Preventative health care 12/26/2015  . SVT (supraventricular tachycardia) ([place])    s/p RFCA 09/26/12  . Vitamin D deficiency 12/26/2015    Past Surgical History:  Procedure Laterality Date  . ADENOIDECTOMY    . CARDIAC ELECTROPHYSIOLOGY STUDY AND ABLATION  2013  . ENDOVENOUS ABLATION SAPHENOUS VEIN W/ LASER Bilateral    Thermal ablation  . KNEE ARTHROSCOPY WITH MENISCAL REPAIR Left 08/25/2016   Procedure: KNEE ARTHROSCOPY WITH partial medial meniscectomy, chondroplasty;  Surgeon: Melrose Nakayama, MD;  Location: [facility];  Service: Orthopedics;  Laterality: Left;  KNEE ARTHROSCOPY WITH partial medial meniscectomy, chondroplasty  . PALATE / UVULA BIOPSY / EXCISION    . TONSILLECTOMY  52 yrs old  . V-TACH ABLATION N/A 09/26/2012   Procedure: V-TACH ABLATION;  Surgeon: Evans Lance, MD;  Location: [facility] CATH LAB;  Service: Cardiovascular;  Laterality: N/A;  . WISDOM TOOTH EXTRACTION  52 yrs old      Family History  Problem Relation Age of Onset  . Hypertension Mother   . Dementia Mother   . Stroke Mother   . Dementia Father   . Hypertension Father   . Cataracts Father        bilateral  . Alzheimer's disease Father   . Cancer Sister 40       metastatic  . Hypertension Brother   . ADD / ADHD Daughter   . ADD / ADHD Son   . Heart attack Maternal Grandfather        pacemaker  . Cancer Maternal Grandfather        ?  Marland Kitchen Cancer Paternal Grandmother        colon    Social History   Socioeconomic History  . Marital status: Significant Other    Spouse name: [name]  . Number of children: 2  . Years of education: Not on file  . Highest education level: Not on file  Occupational History  . Occupation: Therapist, sports  Social Needs  . Financial resource strain: Not on file  . Food insecurity:    Worry: Not on file    Inability: Not on file  . Transportation needs:    Medical: Not on file    Non-medical: Not on file  Tobacco Use  . Smoking status: Never Smoker  . Smokeless tobacco: Never Used  Substance and Sexual Activity  . Alcohol use: Yes    Comment: two  or three times a year  . Drug use: No  . Sexual activity: Yes    Partners: Male    Comment: lives with kids, significant other and step daughter. minimizing dairy and gluten, exercise  Lifestyle  . Physical activity:    Days per week: Not on file    Minutes per session: Not on file  . Stress: Not on file  Relationships  . Social connections:    Talks on phone: Not on file    Gets together: Not on file    Attends religious service: Not on file    Active member of club or organization: Not on file    Attends meetings of clubs or organizations: Not on file    Relationship status: Not on file  . Intimate partner violence:    Fear of current or ex partner: Not on file    Emotionally abused: Not on file    Physically abused: Not on file    Forced sexual activity: Not on file  Other Topics Concern  . Not on file   Social History Narrative  . Not on file    Outpatient Medications Prior to Visit  Medication Sig Dispense Refill  . aspirin EC 81 MG tablet Take 1 tablet (81 mg total) by mouth daily. 30 tablet 0  . Cholecalciferol (VITAMIN D) 2000 units CAPS Take 1 capsule by mouth daily.    Marland Kitchen diltiazem (CARDIZEM CD) 180 MG 24 hr capsule Take 1 capsule (180 mg total) by mouth 2 (two) times daily. 180 capsule 3  . ergocalciferol (VITAMIN D2) 50000 units capsule Take 1 capsule (50,000 Units total) by mouth once a week. 12 capsule 1  . flecainide (TAMBOCOR) 150 MG tablet Take 2 tablets (300 mg total) by mouth as needed. 18 tablet 1  . Magnesium 250 MG TABS Take 1 tablet by mouth daily.    . metFORMIN (GLUCOPHAGE) 500 MG tablet Take 1 tablet (500 mg total) by mouth 2 (two) times daily with a meal. 60 tablet 0  . Omega-3 Fatty Acids (FISH OIL PO) Take 1 capsule by mouth daily.    Marland Kitchen escitalopram (LEXAPRO) 10 MG tablet Take 1 tablet (10 mg total) by mouth daily. 90 tablet 1  . irbesartan (AVAPRO) 300 MG tablet Take 1 tablet (300 mg total) by mouth daily. 90 tablet 1  . triamterene-hydrochlorothiazide (MAXZIDE-25) 37.5-25 MG tablet Take 1 tablet by mouth daily. 90 tablet 1   No facility-administered medications prior to visit.     Allergies  Allergen Reactions  . Sunflower Oil Swelling    Review of Systems  Constitutional: Positive for malaise/fatigue. Negative for chills and fever.  HENT: Negative for congestion and hearing loss.   Eyes: Negative for discharge.  Respiratory: Negative for cough, sputum production and shortness of breath.   Cardiovascular: Negative for chest pain, palpitations and leg swelling.  Gastrointestinal: Negative for abdominal pain, blood in stool, constipation, diarrhea, heartburn, nausea and vomiting.  Genitourinary: Negative for dysuria, frequency, hematuria and urgency.  Musculoskeletal: Negative for back pain, falls and myalgias.  Skin: Negative for rash.  Neurological:  Negative for dizziness, sensory change, loss of consciousness, weakness and headaches.  Endo/Heme/Allergies: Negative for environmental allergies. Does not bruise/bleed easily.  Psychiatric/Behavioral: Negative for depression and suicidal ideas. The patient is not nervous/anxious and does not have insomnia.        Objective:    Physical Exam  Constitutional: She is oriented to person, place, and time. No distress.  HENT:  Head: Normocephalic and  atraumatic.  Right Ear: External ear normal.  Left Ear: External ear normal.  Nose: Nose normal.  Mouth/Throat: Oropharynx is clear and moist. No oropharyngeal exudate.  Eyes: Pupils are equal, round, and reactive to light. Conjunctivae are normal. Right eye exhibits no discharge. Left eye exhibits no discharge. No scleral icterus.  Neck: Normal range of motion. Neck supple. No thyromegaly present.  Cardiovascular: Normal rate, regular rhythm, normal heart sounds and intact distal pulses.  No murmur heard. Pulmonary/Chest: Effort normal and breath sounds normal. No respiratory distress. She has no wheezes. She has no rales.  Abdominal: Soft. Bowel sounds are normal. She exhibits no distension and no mass. There is no tenderness.  Musculoskeletal: Normal range of motion. She exhibits no edema or tenderness.  Lymphadenopathy:    She has no cervical adenopathy.  Neurological: She is alert and oriented to person, place, and time. She has normal reflexes. She displays normal reflexes. No cranial nerve deficit. Coordination abnormal.  Skin: Skin is warm and dry. No rash noted. She is not diaphoretic.    BP 136/80 (BP Location: Left Arm, Patient Position: Sitting, Cuff Size: Normal)   Pulse 68   Temp 98.1 F (36.7 C) (Oral)   Resp 18   Ht 5' 8.5" (1.74 m)   Wt 243 lb 3.2 oz (110.3 kg)   SpO2 97%   BMI 36.44 kg/m  Wt Readings from Last 3 Encounters:  06/11/18 243 lb 3.2 oz (110.3 kg)  05/16/18 238 lb (108 kg)  05/01/18 238 lb (108 kg)      Lab Results  Component Value Date   WBC 8.4 03/05/2018   HGB 11.3 03/05/2018   HCT 34.2 03/05/2018   PLT 331 03/05/2018   GLUCOSE 104 (H) 03/05/2018   CHOL 184 03/05/2018   TRIG 87 03/05/2018   HDL 67 03/05/2018   LDLCALC 100 (H) 03/05/2018   ALT 14 03/05/2018   AST 15 03/05/2018   NA 141 03/05/2018   K 4.2 03/05/2018   CL 99 03/05/2018   CREATININE 0.82 03/05/2018   BUN 23 03/05/2018   CO2 29 03/05/2018   TSH 1.900 03/05/2018   HGBA1C 5.9 (H) 03/05/2018    Lab Results  Component Value Date   TSH 1.900 03/05/2018   Lab Results  Component Value Date   WBC 8.4 03/05/2018   HGB 11.3 03/05/2018   HCT 34.2 03/05/2018   MCV 89 03/05/2018   PLT 331 03/05/2018   Lab Results  Component Value Date   NA 141 03/05/2018   K 4.2 03/05/2018   CO2 29 03/05/2018   GLUCOSE 104 (H) 03/05/2018   BUN 23 03/05/2018   CREATININE 0.82 03/05/2018   BILITOT 0.2 03/05/2018   ALKPHOS 57 03/05/2018   AST 15 03/05/2018   ALT 14 03/05/2018   PROT 7.5 03/05/2018   ALBUMIN 4.4 03/05/2018   CALCIUM 9.7 03/05/2018   ANIONGAP 12 08/21/2016   GFR 81.02 02/05/2018   Lab Results  Component Value Date   CHOL 184 03/05/2018   Lab Results  Component Value Date   HDL 67 03/05/2018   Lab Results  Component Value Date   LDLCALC 100 (H) 03/05/2018   Lab Results  Component Value Date   TRIG 87 03/05/2018   Lab Results  Component Value Date   CHOLHDL 2.7 03/05/2018   Lab Results  Component Value Date   HGBA1C 5.9 (H) 03/05/2018       Assessment & Plan:   Problem List Items Addressed This Visit  Hypertension    Well controlled, no changes to meds. Encouraged heart healthy diet such as the DASH diet and exercise as tolerated.       Relevant Medications   irbesartan (AVAPRO) 300 MG tablet   triamterene-hydrochlorothiazide (MAXZIDE-25) 37.5-25 MG tablet   Other Relevant Orders   CT Abdomen Pelvis W Contrast   Obesity    Encouraged DASH diet, decrease po intake and  increase exercise as tolerated. Needs 7-8 hours of sleep nightly. Avoid trans fats, eat small, frequent meals every 4-5 hours with lean proteins, complex carbs and healthy fats. Minimize simple carbs.        Hyperlipidemia    Encouraged heart healthy diet, increase exercise, avoid trans fats.      Relevant Medications   irbesartan (AVAPRO) 300 MG tablet   triamterene-hydrochlorothiazide (MAXZIDE-25) 37.5-25 MG tablet   Hyperglycemia    hgba1c acceptable, minimize simple carbs. Increase exercise as tolerated.       Preventative health care    Patient encouraged to maintain heart healthy diet, regular exercise, adequate sleep. Consider daily probiotics. Take medications as prescribed. Labs reviewed with patient. Given and reviewed copy of ACP documents from [place] and encouraged to complete and return      Vitamin D deficiency    Supplement and monitor      Adrenal adenoma    Have ordered repeat image for surveillance to assess for changes.        Other Visit Diagnoses    Adrenal mass [facility])    -  Primary   Relevant Orders   CT Abdomen Pelvis W Contrast      I am having Misty Mahoney maintain her Vitamin D, Omega-3 Fatty Acids (FISH OIL PO), diltiazem, flecainide, Magnesium, aspirin EC, metFORMIN, ergocalciferol, irbesartan, escitalopram, and triamterene-hydrochlorothiazide.  Meds ordered this encounter  Medications  . irbesartan (AVAPRO) 300 MG tablet    Sig: Take 1 tablet (300 mg total) by mouth daily.    Dispense:  90 tablet    Refill:  1  . escitalopram (LEXAPRO) 10 MG tablet    Sig: Take 1 tablet (10 mg total) by mouth daily.    Dispense:  90 tablet    Refill:  1  . triamterene-hydrochlorothiazide (MAXZIDE-25) 37.5-25 MG tablet    Sig: Take 1 tablet by mouth daily.    Dispense:  90 tablet    Refill:  1     Penni Homans, MD

## 2018-06-16 NOTE — Assessment & Plan Note (Signed)
Have ordered repeat image for surveillance to assess for changes.

## 2018-06-18 ENCOUNTER — Ambulatory Visit (INDEPENDENT_AMBULATORY_CARE_PROVIDER_SITE_OTHER): Payer: No Typology Code available for payment source | Admitting: Family Medicine

## 2018-06-18 VITALS — BP 130/73 | HR 63 | Temp 98.3°F | Ht 68.0 in | Wt 237.0 lb

## 2018-06-18 DIAGNOSIS — Z6836 Body mass index (BMI) 36.0-36.9, adult: Secondary | ICD-10-CM

## 2018-06-18 DIAGNOSIS — R7303 Prediabetes: Secondary | ICD-10-CM | POA: Diagnosis not present

## 2018-06-18 LAB — HM DIABETES EYE EXAM

## 2018-06-18 NOTE — Progress Notes (Signed)
Office: [phone]  /  Fax: [phone]   HPI:   Chief Complaint: OBESITY Misty Mahoney is here to discuss Misty Mahoney progress with Misty Mahoney obesity treatment plan. She is on the Category 2 plan and is following Misty Mahoney eating plan approximately 50 % of the time. She states she is exercising 0 minutes 0 times per week. Misty Mahoney has been on vacation 2 times since our last visit. She has done well with being mindful. She is ready to get back to a more structured plan.  Misty Mahoney weight is 237 lb (107.5 kg) today and she has had a weight loss of 1 pound over a period of 4 weeks since Misty Mahoney last visit. She has lost 7 lbs since starting treatment with [place].  Pre-Diabetes Misty Mahoney has a diagnosis of prediabetes based on Misty Mahoney elevated Hgb A1c and was informed this puts Misty Mahoney at greater risk of developing diabetes. She is stable on metformin overall is doing well with diet prescription. She continues to work on diet and exercise to decrease risk of diabetes. She denies nausea, vomiting, or hypoglycemia.   ALLERGIES: Allergies  Allergen Reactions  . Sunflower Oil Swelling    MEDICATIONS: Current Outpatient Medications on File Prior to Visit  Medication Sig Dispense Refill  . aspirin EC 81 MG tablet Take 1 tablet (81 mg total) by mouth daily. 30 tablet 0  . Cholecalciferol (VITAMIN D) 2000 units CAPS Take 1 capsule by mouth daily.    Marland Kitchen diltiazem (CARDIZEM CD) 180 MG 24 hr capsule Take 1 capsule (180 mg total) by mouth 2 (two) times daily. 180 capsule 3  . ergocalciferol (VITAMIN D2) 50000 units capsule Take 1 capsule (50,000 Units total) by mouth once a week. 12 capsule 1  . escitalopram (LEXAPRO) 10 MG tablet Take 1 tablet (10 mg total) by mouth daily. 90 tablet 1  . flecainide (TAMBOCOR) 150 MG tablet Take 2 tablets (300 mg total) by mouth as needed. 18 tablet 1  . irbesartan (AVAPRO) 300 MG tablet Take 1 tablet (300 mg total) by mouth daily. 90 tablet 1  . Magnesium 250 MG TABS Take 1 tablet by mouth daily.    . metFORMIN  (GLUCOPHAGE) 500 MG tablet Take 1 tablet (500 mg total) by mouth 2 (two) times daily with a meal. 60 tablet 0  . Omega-3 Fatty Acids (FISH OIL PO) Take 1 capsule by mouth daily.    Marland Kitchen triamterene-hydrochlorothiazide (MAXZIDE-25) 37.5-25 MG tablet Take 1 tablet by mouth daily. 90 tablet 1   No current facility-administered medications on file prior to visit.     PAST MEDICAL HISTORY: Past Medical History:  Diagnosis Date  . Anemia    gestational  . Atrial fibrillation ([place])   . Chicken pox as a child  . Depression 06/15/2015  . DM, gestational, diet controlled 04/10/2014  . Fatigue   . H/O gestational diabetes mellitus, not currently pregnant 04/10/2014  . History of chicken pox   . Hyperlipidemia 12/20/2015  . Hypertension   . Joint pain   . Obesity, unspecified 04/10/2014  . OSA (obstructive sleep apnea)   . Osteoarthritis   . Palpitations   . Prediabetes   . Preventative health care 12/26/2015  . SVT (supraventricular tachycardia) ([place])    s/p RFCA 09/26/12  . Vitamin D deficiency 12/26/2015    PAST SURGICAL HISTORY: Past Surgical History:  Procedure Laterality Date  . ADENOIDECTOMY    . CARDIAC ELECTROPHYSIOLOGY STUDY AND ABLATION  2013  . ENDOVENOUS ABLATION SAPHENOUS VEIN W/ LASER Bilateral  Thermal ablation  . KNEE ARTHROSCOPY WITH MENISCAL REPAIR Left 08/25/2016   Procedure: KNEE ARTHROSCOPY WITH partial medial meniscectomy, chondroplasty;  Surgeon: Melrose Nakayama, MD;  Location: [place];  Service: Orthopedics;  Laterality: Left;  KNEE ARTHROSCOPY WITH partial medial meniscectomy, chondroplasty  . PALATE / UVULA BIOPSY / EXCISION    . TONSILLECTOMY  52 yrs old  . V-TACH ABLATION N/A 09/26/2012   Procedure: V-TACH ABLATION;  Surgeon: Evans Lance, MD;  Location: [facility] CATH LAB;  Service: Cardiovascular;  Laterality: N/A;  . WISDOM TOOTH EXTRACTION  52 yrs old    SOCIAL HISTORY: Social History   Tobacco Use  . Smoking status: Never Smoker  . Smokeless  tobacco: Never Used  Substance Use Topics  . Alcohol use: Yes    Comment: two or three times a year  . Drug use: No    FAMILY HISTORY: Family History  Problem Relation Age of Onset  . Hypertension Mother   . Dementia Mother   . Stroke Mother   . Dementia Father   . Hypertension Father   . Cataracts Father        bilateral  . Alzheimer's disease Father   . Cancer Sister 68       metastatic  . Hypertension Brother   . ADD / ADHD Daughter   . ADD / ADHD Son   . Heart attack Maternal Grandfather        pacemaker  . Cancer Maternal Grandfather        ?  Marland Kitchen Cancer Paternal Grandmother        colon    ROS: Review of Systems  Constitutional: Positive for weight loss.  Gastrointestinal: Negative for nausea and vomiting.  Endo/Heme/Allergies:       Negative for hypoglycemia    PHYSICAL EXAM: Blood pressure 130/73, pulse 63, temperature 98.3 F (36.8 C), temperature source Oral, height 5\' 8"  (1.727 m), weight 237 lb (107.5 kg), SpO2 97 %. Body mass index is 36.04 kg/m. Physical Exam  Constitutional: She is oriented to person, place, and time. She appears well-developed and well-nourished.  Cardiovascular: Normal rate.  Pulmonary/Chest: Effort normal.  Musculoskeletal: Normal range of motion.  Neurological: She is oriented to person, place, and time.  Skin: Skin is warm and dry.  Psychiatric: She has a normal mood and affect. Misty Mahoney behavior is normal.  Vitals reviewed.   RECENT LABS AND TESTS: BMET    Component Value Date/Time   NA 141 03/05/2018 0901   K 4.2 03/05/2018 0901   CL 99 03/05/2018 0901   CO2 29 03/05/2018 0901   GLUCOSE 104 (H) 03/05/2018 0901   GLUCOSE 110 (H) 02/05/2018 0951   BUN 23 03/05/2018 0901   CREATININE 0.82 03/05/2018 0901   CREATININE 0.59 06/04/2014 1029   CALCIUM 9.7 03/05/2018 0901   GFRNONAA 83 03/05/2018 0901   GFRAA 95 03/05/2018 0901   Lab Results  Component Value Date   HGBA1C 5.9 (H) 03/05/2018   HGBA1C 6.2 12/13/2017    HGBA1C 5.6 11/07/2016   HGBA1C 5.7 12/20/2015   HGBA1C 5.6 06/15/2015   Lab Results  Component Value Date   INSULIN 20.3 03/05/2018   CBC    Component Value Date/Time   WBC 8.4 03/05/2018 0901   WBC 6.3 12/13/2017 1029   RBC 3.92 03/05/2018 0901   RBC 4.04 12/13/2017 1029   HGB 11.3 03/05/2018 0901   HCT 34.2 03/05/2018 0917   PLT 331 03/05/2018 0901   MCV 89 03/05/2018 0901  MCH 28.8 03/05/2018 0901   MCH 28.6 02/07/2016 1022   MCHC 32.3 03/05/2018 0901   MCHC 32.7 12/13/2017 1029   RDW 13.8 03/05/2018 0901   LYMPHSABS 1.9 03/05/2018 0901   MONOABS 0.3 02/07/2016 1022   EOSABS 0.2 03/05/2018 0901   BASOSABS 0.0 03/05/2018 0901   Iron/TIBC/Ferritin/ %Sat    Component Value Date/Time   IRON 50 03/05/2018 0917   TIBC 316 03/05/2018 0917   FERRITIN 47 03/05/2018 0917   IRONPCTSAT 16 03/05/2018 0917   Lipid Panel     Component Value Date/Time   CHOL 184 03/05/2018 0901   TRIG 87 03/05/2018 0901   HDL 67 03/05/2018 0901   CHOLHDL 2.7 03/05/2018 0901   CHOLHDL 3 12/13/2017 1029   VLDL 13.4 12/13/2017 1029   LDLCALC 100 (H) 03/05/2018 0901   Hepatic Function Panel     Component Value Date/Time   PROT 7.5 03/05/2018 0901   ALBUMIN 4.4 03/05/2018 0901   AST 15 03/05/2018 0901   ALT 14 03/05/2018 0901   ALKPHOS 57 03/05/2018 0901   BILITOT 0.2 03/05/2018 0901   BILIDIR 0.1 06/04/2014 1029   IBILI 0.3 06/04/2014 1029      Component Value Date/Time   TSH 1.900 03/05/2018 0901   TSH 1.19 12/13/2017 1029   TSH 1.00 11/07/2016 0929   Results for IRAIDA, CRAGIN (MRN [id number]) as of 06/18/2018 15:24  Ref. Range 03/05/2018 09:01  Vitamin D, 25-Hydroxy Latest Ref Range: 30.0 - 100.0 ng/mL 27.9 (L)   ASSESSMENT AND PLAN: Prediabetes  Class 2 severe obesity with serious comorbidity and body mass index (BMI) of 36.0 to 36.9 in adult, unspecified obesity type Misty Mahoney)  PLAN:  Pre-Diabetes Misty Mahoney will continue to work on weight loss, exercise, and decreasing  simple carbohydrates in Misty Mahoney diet to help decrease the risk of diabetes. We discussed metformin including benefits and risks. She was informed that eating too many simple carbohydrates or too many calories at one sitting increases the likelihood of GI side effects. Misty Mahoney agreed to continue metformin 500 mg 2 times per day and a prescription was not written today. Misty Mahoney agreed to follow up with [place] as directed to monitor Misty Mahoney progress. She will continue Misty Mahoney diet for nowWe will recheck labs in 1 month.  We spent > than 50% of the 15 minute visit on the counseling as documented in the note.  Obesity Misty Mahoney is currently in the action stage of change. As such, Misty Mahoney goal is to continue with weight loss efforts. She has agreed to follow the Category 2 plan or the Pescatarian plan. Misty Mahoney has been instructed to work up to a goal of 150 minutes of combined cardio and strengthening exercise per week for weight loss and overall health benefits. We discussed the following Behavioral Modification Strategies today: work on meal planning and easy cooking plans, dealing with family or coworker sabotage, and travel eating strategies.  Murray has agreed to follow up with our clinic in 2 weeks. She was informed of the importance of frequent follow up visits to maximize Misty Mahoney success with intensive lifestyle modifications for Misty Mahoney multiple health conditions.   OBESITY BEHAVIORAL INTERVENTION VISIT  Today's visit was # 6 out of 22.  Starting weight: 244 Starting date: 05/16/18 Today's weight : Weight: 237 lb (107.5 kg)  Today's date: 06/18/2018 Total lbs lost to date: 7    ASK: We discussed the diagnosis of obesity with [name] today and Saher agreed to give [place] permission to discuss obesity behavioral modification  therapy today.  ASSESS: Sandrea has the diagnosis of obesity and Misty Mahoney BMI today is 36.04 Jenisse is in the action stage of change.  ADVISE: Nitzia was educated on the multiple health risks of  obesity as well as the benefit of weight loss to improve Misty Mahoney health. She was advised of the need for long term treatment and the importance of lifestyle modifications.  AGREE: Multiple dietary modification options and treatment options were discussed and Annisa agreed to the above obesity treatment plan.  I, Marcille Blanco, am acting as Location manager for Dennard Nip, MD  I have reviewed the above documentation for accuracy and completeness, and I agree with the above. -Dennard Nip, MD

## 2018-06-21 ENCOUNTER — Encounter: Payer: Self-pay | Admitting: Family Medicine

## 2018-06-25 ENCOUNTER — Encounter: Payer: Self-pay | Admitting: Family Medicine

## 2018-06-26 NOTE — Telephone Encounter (Signed)
Author phoned pt. to clarify which 5 rx pt. Needed refills on, per mychart message, to be sent to [facility] long outpatient pharmacy. Author left detailed VM to return call [phone].

## 2018-06-28 ENCOUNTER — Encounter: Payer: Self-pay | Admitting: Family Medicine

## 2018-07-01 ENCOUNTER — Encounter: Payer: Self-pay | Admitting: Family Medicine

## 2018-07-01 DIAGNOSIS — E559 Vitamin D deficiency, unspecified: Secondary | ICD-10-CM

## 2018-07-01 DIAGNOSIS — R7303 Prediabetes: Secondary | ICD-10-CM

## 2018-07-02 ENCOUNTER — Ambulatory Visit (INDEPENDENT_AMBULATORY_CARE_PROVIDER_SITE_OTHER): Payer: No Typology Code available for payment source | Admitting: Physician Assistant

## 2018-07-02 VITALS — BP 123/77 | HR 69 | Temp 98.3°F | Ht 68.0 in | Wt 233.0 lb

## 2018-07-02 DIAGNOSIS — R7303 Prediabetes: Secondary | ICD-10-CM

## 2018-07-02 DIAGNOSIS — Z6835 Body mass index (BMI) 35.0-35.9, adult: Secondary | ICD-10-CM

## 2018-07-02 MED ORDER — TRIAMTERENE-HCTZ 37.5-25 MG PO TABS: 1.0000 | ORAL_TABLET | Freq: Every day | ORAL | 1 refills | Status: DC

## 2018-07-02 MED ORDER — METFORMIN HCL 500 MG PO TABS: 500.0000 mg | ORAL_TABLET | Freq: Two times a day (BID) | ORAL | 1 refills | Status: DC

## 2018-07-02 MED ORDER — IRBESARTAN 300 MG PO TABS: 300.0000 mg | ORAL_TABLET | Freq: Every day | ORAL | 1 refills | Status: DC

## 2018-07-02 MED ORDER — ESCITALOPRAM OXALATE 10 MG PO TABS: 10.0000 mg | ORAL_TABLET | Freq: Every day | ORAL | 1 refills | Status: DC

## 2018-07-02 MED ORDER — ERGOCALCIFEROL 1.25 MG (50000 UT) PO CAPS: 50000.0000 [IU] | ORAL_CAPSULE | ORAL | 1 refills | Status: DC

## 2018-07-02 NOTE — Progress Notes (Signed)
Office: [phone]  /  Fax: [phone]   HPI:   Chief Complaint: OBESITY Misty Mahoney is here to discuss her progress with her obesity treatment plan. She is on the Category 2 plan or follow the Pescatarian eating plan and is following her eating plan approximately 75 % of the time. She states she is exercising 0 minutes 0 times per week. Misty Mahoney did very well with weight loss. She reports that she has been following the plan only some of the time due to stress at home. She is ready to get back on track.  Her weight is 233 lb (105.7 kg) today and has had a weight loss of 4 pounds over a period of 2 weeks since her last visit. She has lost 11 lbs since starting treatment with [place].  Pre-Diabetes Misty Mahoney has a diagnosis of pre-diabetes based on her elevated Hgb A1c and was informed this puts her at greater risk of developing diabetes. She denies polyphagia, hypoglycemia, nausea, vomiting, or diarrhea on metformin. She continues to work on diet and exercise to decrease risk of diabetes.   ALLERGIES: Allergies  Allergen Reactions  . Sunflower Oil Swelling    MEDICATIONS: Current Outpatient Medications on File Prior to Visit  Medication Sig Dispense Refill  . aspirin EC 81 MG tablet Take 1 tablet (81 mg total) by mouth daily. 30 tablet 0  . Cholecalciferol (VITAMIN D) 2000 units CAPS Take 1 capsule by mouth daily.    Marland Kitchen diltiazem (CARDIZEM CD) 180 MG 24 hr capsule Take 1 capsule (180 mg total) by mouth 2 (two) times daily. 180 capsule 3  . ergocalciferol (VITAMIN D2) 50000 units capsule Take 1 capsule (50,000 Units total) by mouth once a week. 12 capsule 1  . escitalopram (LEXAPRO) 10 MG tablet Take 1 tablet (10 mg total) by mouth daily. 90 tablet 1  . flecainide (TAMBOCOR) 150 MG tablet Take 2 tablets (300 mg total) by mouth as needed. 18 tablet 1  . irbesartan (AVAPRO) 300 MG tablet Take 1 tablet (300 mg total) by mouth daily. 90 tablet 1  . Magnesium 250 MG TABS Take 1 tablet by mouth daily.      . metFORMIN (GLUCOPHAGE) 500 MG tablet Take 1 tablet (500 mg total) by mouth 2 (two) times daily with a meal. 180 tablet 1  . Omega-3 Fatty Acids (FISH OIL PO) Take 1 capsule by mouth daily.    Marland Kitchen triamterene-hydrochlorothiazide (MAXZIDE-25) 37.5-25 MG tablet Take 1 tablet by mouth daily. 90 tablet 1   No current facility-administered medications on file prior to visit.     PAST MEDICAL HISTORY: Past Medical History:  Diagnosis Date  . Anemia    gestational  . Atrial fibrillation ([place])   . Chicken pox as a child  . Depression 06/15/2015  . DM, gestational, diet controlled 04/10/2014  . Fatigue   . H/O gestational diabetes mellitus, not currently pregnant 04/10/2014  . History of chicken pox   . Hyperlipidemia 12/20/2015  . Hypertension   . Joint pain   . Obesity, unspecified 04/10/2014  . OSA (obstructive sleep apnea)   . Osteoarthritis   . Palpitations   . Prediabetes   . Preventative health care 12/26/2015  . SVT (supraventricular tachycardia) ([place])    s/p RFCA 09/26/12  . Vitamin D deficiency 12/26/2015    PAST SURGICAL HISTORY: Past Surgical History:  Procedure Laterality Date  . ADENOIDECTOMY    . CARDIAC ELECTROPHYSIOLOGY STUDY AND ABLATION  2013  . ENDOVENOUS ABLATION SAPHENOUS VEIN W/ LASER Bilateral  Thermal ablation  . KNEE ARTHROSCOPY WITH MENISCAL REPAIR Left 08/25/2016   Procedure: KNEE ARTHROSCOPY WITH partial medial meniscectomy, chondroplasty;  Surgeon: Melrose Nakayama, MD;  Location: [place];  Service: Orthopedics;  Laterality: Left;  KNEE ARTHROSCOPY WITH partial medial meniscectomy, chondroplasty  . PALATE / UVULA BIOPSY / EXCISION    . TONSILLECTOMY  51 yrs old  . V-TACH ABLATION N/A 09/26/2012   Procedure: V-TACH ABLATION;  Surgeon: Evans Lance, MD;  Location: [facility] CATH LAB;  Service: Cardiovascular;  Laterality: N/A;  . WISDOM TOOTH EXTRACTION  52 yrs old    SOCIAL HISTORY: Social History   Tobacco Use  . Smoking status: Never Smoker   . Smokeless tobacco: Never Used  Substance Use Topics  . Alcohol use: Yes    Comment: two or three times a year  . Drug use: No    FAMILY HISTORY: Family History  Problem Relation Age of Onset  . Hypertension Mother   . Dementia Mother   . Stroke Mother   . Dementia Father   . Hypertension Father   . Cataracts Father        bilateral  . Alzheimer's disease Father   . Cancer Sister 23       metastatic  . Hypertension Brother   . ADD / ADHD Daughter   . ADD / ADHD Son   . Heart attack Maternal Grandfather        pacemaker  . Cancer Maternal Grandfather        ?  Marland Kitchen Cancer Paternal Grandmother        colon    ROS: Review of Systems  Constitutional: Positive for weight loss.  Gastrointestinal: Negative for diarrhea, nausea and vomiting.  Endo/Heme/Allergies:       Negative polyphagia Negative hypoglycemia    PHYSICAL EXAM: Blood pressure 123/77, pulse 69, temperature 98.3 F (36.8 C), temperature source Oral, height 5\' 8"  (1.727 m), weight 233 lb (105.7 kg), SpO2 98 %. Body mass index is 35.43 kg/m. Physical Exam  Constitutional: She is oriented to person, place, and time. She appears well-developed and well-nourished.  Cardiovascular: Normal rate.  Pulmonary/Chest: Effort normal.  Musculoskeletal: Normal range of motion.  Neurological: She is oriented to person, place, and time.  Skin: Skin is warm and dry.  Psychiatric: She has a normal mood and affect. Her behavior is normal.  Vitals reviewed.   RECENT LABS AND TESTS: BMET    Component Value Date/Time   NA 141 03/05/2018 0901   K 4.2 03/05/2018 0901   CL 99 03/05/2018 0901   CO2 29 03/05/2018 0901   GLUCOSE 104 (H) 03/05/2018 0901   GLUCOSE 110 (H) 02/05/2018 0951   BUN 23 03/05/2018 0901   CREATININE 0.82 03/05/2018 0901   CREATININE 0.59 06/04/2014 1029   CALCIUM 9.7 03/05/2018 0901   GFRNONAA 83 03/05/2018 0901   GFRAA 95 03/05/2018 0901   Lab Results  Component Value Date   HGBA1C 5.9 (H)  03/05/2018   HGBA1C 6.2 12/13/2017   HGBA1C 5.6 11/07/2016   HGBA1C 5.7 12/20/2015   HGBA1C 5.6 06/15/2015   Lab Results  Component Value Date   INSULIN 20.3 03/05/2018   CBC    Component Value Date/Time   WBC 8.4 03/05/2018 0901   WBC 6.3 12/13/2017 1029   RBC 3.92 03/05/2018 0901   RBC 4.04 12/13/2017 1029   HGB 11.3 03/05/2018 0901   HCT 34.2 03/05/2018 0917   PLT 331 03/05/2018 0901   MCV 89 03/05/2018  0901   MCH 28.8 03/05/2018 0901   MCH 28.6 02/07/2016 1022   MCHC 32.3 03/05/2018 0901   MCHC 32.7 12/13/2017 1029   RDW 13.8 03/05/2018 0901   LYMPHSABS 1.9 03/05/2018 0901   MONOABS 0.3 02/07/2016 1022   EOSABS 0.2 03/05/2018 0901   BASOSABS 0.0 03/05/2018 0901   Iron/TIBC/Ferritin/ %Sat    Component Value Date/Time   IRON 50 03/05/2018 0917   TIBC 316 03/05/2018 0917   FERRITIN 47 03/05/2018 0917   IRONPCTSAT 16 03/05/2018 0917   Lipid Panel     Component Value Date/Time   CHOL 184 03/05/2018 0901   TRIG 87 03/05/2018 0901   HDL 67 03/05/2018 0901   CHOLHDL 2.7 03/05/2018 0901   CHOLHDL 3 12/13/2017 1029   VLDL 13.4 12/13/2017 1029   LDLCALC 100 (H) 03/05/2018 0901   Hepatic Function Panel     Component Value Date/Time   PROT 7.5 03/05/2018 0901   ALBUMIN 4.4 03/05/2018 0901   AST 15 03/05/2018 0901   ALT 14 03/05/2018 0901   ALKPHOS 57 03/05/2018 0901   BILITOT 0.2 03/05/2018 0901   BILIDIR 0.1 06/04/2014 1029   IBILI 0.3 06/04/2014 1029      Component Value Date/Time   TSH 1.900 03/05/2018 0901   TSH 1.19 12/13/2017 1029   TSH 1.00 11/07/2016 0929    ASSESSMENT AND PLAN: Prediabetes  Class 2 severe obesity with serious comorbidity and body mass index (BMI) of 35.0 to 35.9 in adult, unspecified obesity type ([place])  PLAN:  Pre-Diabetes Misty Mahoney will continue to work on weight loss, diet, exercise, and decreasing simple carbohydrates in her diet to help decrease the risk of diabetes. We dicussed metformin including benefits and risks. She  was informed that eating too many simple carbohydrates or too many calories at one sitting increases the likelihood of GI side effects. Misty Mahoney agrees to continue taking metformin and she agrees to follow up with our clinic in 3 weeks as directed to monitor her progress.  We spent > than 50% of the 15 minute visit on the counseling as documented in the note.  Obesity Misty Mahoney is currently in the action stage of change. As such, her goal is to continue with weight loss efforts She has agreed to follow the Category 2 plan or follow the [place] eating plan Misty Mahoney has been instructed to work up to a goal of 150 minutes of combined cardio and strengthening exercise per week for weight loss and overall health benefits. We discussed the following Behavioral Modification Strategies today: work on meal planning and easy cooking plans and no skipping meals   Misty Mahoney has agreed to follow up with our clinic in 3 weeks. She was informed of the importance of frequent follow up visits to maximize her success with intensive lifestyle modifications for her multiple health conditions.   OBESITY BEHAVIORAL INTERVENTION VISIT  Today's visit was # 7 out of 22.  Starting weight: 244 lbs Starting date: 03/05/18 Today's weight : 233 lbs Today's date: 07/02/2018 Total lbs lost to date: 11    ASK: We discussed the diagnosis of obesity with Misty Mahoney today and Misty Mahoney agreed to give [place] permission to discuss obesity behavioral modification therapy today.  ASSESS: Misty Mahoney has the diagnosis of obesity and her BMI today is 35.44 Misty Mahoney is in the action stage of change   ADVISE: Misty Mahoney was educated on the multiple health risks of obesity as well as the benefit of weight loss to improve her health. She was advised  of the need for long term treatment and the importance of lifestyle modifications.  AGREE: Multiple dietary modification options and treatment options were discussed and  Misty Mahoney agreed to the above  obesity treatment plan.  Wilhemena Durie, am acting as transcriptionist for Abby Potash, PA-C I, Abby Potash, PA-C have reviewed above note and agree with its content

## 2018-07-04 MED FILL — metFORMIN HCL 500 MG TABS: 500 | 90 days supply | Qty: 180 | Fill #0

## 2018-07-04 MED FILL — VIT D2 1.25 MG (50,000 UNIT: 1.25 MG | 84 days supply | Qty: 12 | Fill #0

## 2018-07-04 MED FILL — IRBESARTAN 300 MG TABLET: 300 | 90 days supply | Qty: 90 | Fill #0

## 2018-07-04 MED FILL — TRIAMTERENE/HCTZ 37.5/25 TB: 37.5-25 | 90 days supply | Qty: 90 | Fill #0

## 2018-07-08 ENCOUNTER — Ambulatory Visit (AMBULATORY_SURGERY_CENTER): Payer: Self-pay | Admitting: *Deleted

## 2018-07-08 VITALS — Ht 68.0 in | Wt 239.0 lb

## 2018-07-08 DIAGNOSIS — Z1211 Encounter for screening for malignant neoplasm of colon: Secondary | ICD-10-CM

## 2018-07-08 MED ORDER — NA SULFATE-K SULFATE-MG SULF 17.5-3.13-1.6 GM/177ML PO SOLN: 1.0000 | Freq: Once | ORAL | 0 refills | Status: AC

## 2018-07-08 NOTE — Progress Notes (Signed)
No egg or soy allergy known to patient  No issues with past sedation with any surgeries  or procedures, no intubation problems  No diet pills per patient No home 02 use per patient  No blood thinners per patient  Pt denies issues with constipation  Past hx A fib - on flecainide last episode about 2 months ago - no blood thinners  EMMI video sent to pt's e mail - pt declined

## 2018-07-10 ENCOUNTER — Other Ambulatory Visit: Payer: Self-pay | Admitting: Family Medicine

## 2018-07-10 ENCOUNTER — Encounter: Payer: Self-pay | Admitting: Family Medicine

## 2018-07-10 ENCOUNTER — Other Ambulatory Visit: Payer: Self-pay | Admitting: Dermatology

## 2018-07-10 DIAGNOSIS — L989 Disorder of the skin and subcutaneous tissue, unspecified: Secondary | ICD-10-CM

## 2018-07-12 ENCOUNTER — Encounter: Payer: Self-pay | Admitting: Family Medicine

## 2018-07-12 NOTE — Telephone Encounter (Signed)
FYIMaudie Mahoney or Misty Mahoney.

## 2018-07-24 MED FILL — SUPREP BOWEL PREP KIT: 17.5-3.13-1 | 2 days supply | Qty: 354 | Fill #0

## 2018-07-29 ENCOUNTER — Ambulatory Visit (INDEPENDENT_AMBULATORY_CARE_PROVIDER_SITE_OTHER): Payer: No Typology Code available for payment source | Admitting: Family Medicine

## 2018-07-29 VITALS — BP 121/71 | HR 64 | Temp 98.0°F | Ht 68.0 in | Wt 236.0 lb

## 2018-07-29 DIAGNOSIS — Z6835 Body mass index (BMI) 35.0-35.9, adult: Secondary | ICD-10-CM

## 2018-07-29 DIAGNOSIS — R7303 Prediabetes: Secondary | ICD-10-CM | POA: Diagnosis not present

## 2018-07-30 ENCOUNTER — Encounter: Payer: Self-pay | Admitting: Internal Medicine

## 2018-07-30 ENCOUNTER — Ambulatory Visit (AMBULATORY_SURGERY_CENTER): Payer: No Typology Code available for payment source | Admitting: Internal Medicine

## 2018-07-30 VITALS — BP 163/82 | HR 61 | Temp 98.4°F | Resp 15 | Ht 68.0 in | Wt 233.0 lb

## 2018-07-30 DIAGNOSIS — D120 Benign neoplasm of cecum: Secondary | ICD-10-CM

## 2018-07-30 DIAGNOSIS — Z1211 Encounter for screening for malignant neoplasm of colon: Secondary | ICD-10-CM | POA: Diagnosis present

## 2018-07-30 MED ORDER — SODIUM CHLORIDE 0.9 % IV SOLN: 500.0000 mL | Freq: Once | INTRAVENOUS | Status: DC

## 2018-07-30 NOTE — Progress Notes (Signed)
Called to room to assist during endoscopic procedure.  Patient ID and intended procedure confirmed with present staff. Received instructions for my participation in the procedure from the performing physician.  

## 2018-07-30 NOTE — Op Note (Signed)
[facility] Patient Name: Misty Mahoney Procedure Date: 07/30/2018 1:20 PM MRN: [id number] Endoscopist: Docia Chuck. Henrene Pastor , MD Age: 52 Referring MD:  Date of Birth: Jul 28, 1966 Gender: Female Account #: [id number] Procedure:                Colonoscopy, With cold snare polypectomy x 2 Indications:              Screening for colorectal malignant neoplasm Medicines:                Monitored Anesthesia Care Procedure:                Pre-Anesthesia Assessment:                           - Prior to the procedure, a History and Physical                            was performed, and patient medications and                            allergies were reviewed. The patient's tolerance of                            previous anesthesia was also reviewed. The risks                            and benefits of the procedure and the sedation                            options and risks were discussed with the patient.                            All questions were answered, and informed consent                            was obtained. Prior Anticoagulants: The patient has                            taken no previous anticoagulant or antiplatelet                            agents. ASA Grade Assessment: II - A patient with                            mild systemic disease. After reviewing the risks                            and benefits, the patient was deemed in                            satisfactory condition to undergo the procedure.                           After obtaining informed consent, the colonoscope  was passed under direct vision. Throughout the                            procedure, the patient's blood pressure, pulse, and                            oxygen saturations were monitored continuously. The                            Colonoscope was introduced through the anus and                            advanced to the the cecum, identified by       appendiceal orifice and ileocecal valve. The                            terminal ileum, ileocecal valve, appendiceal                            orifice, and rectum were photographed. The quality                            of the bowel preparation was excellent. The                            colonoscopy was performed without difficulty. The                            patient tolerated the procedure well. The bowel                            preparation used was SUPREP. Scope In: 1:32:59 PM Scope Out: 1:48:11 PM Scope Withdrawal Time: 0 hours 12 minutes 41 seconds  Total Procedure Duration: 0 hours 15 minutes 12 seconds  Findings:                 The terminal ileum appeared normal.                           Two polyps were found in the ileocecal valve. The                            polyps were 1 to 2 mm in size. These polyps were                            removed with a cold snare. Resection and retrieval                            were complete.                           The exam was otherwise without abnormality on                            direct  and retroflexion views. Complications:            No immediate complications. Estimated blood loss:                            None. Estimated Blood Loss:     Estimated blood loss: none. Impression:               - The examined portion of the ileum was normal.                           - Two 1 to 2 mm polyps at the ileocecal valve,                            removed with a cold snare. Resected and retrieved.                           - The examination was otherwise normal on direct                            and retroflexion views. Recommendation:           - Repeat colonoscopy in 5-10 years for surveillance.                           - Patient has a contact number available for                            emergencies. The signs and symptoms of potential                            delayed complications were discussed with the                             patient. Return to normal activities tomorrow.                            Written discharge instructions were provided to the                            patient.                           - Resume previous diet.                           - Continue present medications.                           - Await pathology results. Docia Chuck. Henrene Pastor, MD 07/30/2018 1:52:44 PM This report has been signed electronically.

## 2018-07-30 NOTE — Progress Notes (Signed)
Report to PACU, RN, vss, BBS= Clear.  

## 2018-07-30 NOTE — Progress Notes (Signed)
Pt's states no medical or surgical changes since previsit or office visit. 

## 2018-07-30 NOTE — Patient Instructions (Signed)
YOU HAD AN ENDOSCOPIC PROCEDURE TODAY AT THE [facility]:   Refer to the procedure report that was given to you for any specific questions about what was found during the examination.  If the procedure report does not answer your questions, please call your gastroenterologist to clarify.  If you requested that your care partner not be given the details of your procedure findings, then the procedure report has been included in a sealed envelope for you to review at your convenience later.  YOU SHOULD EXPECT: Some feelings of bloating in the abdomen. Passage of more gas than usual.  Walking can help get rid of the air that was put into your GI tract during the procedure and reduce the bloating. If you had a lower endoscopy (such as a colonoscopy or flexible sigmoidoscopy) you may notice spotting of blood in your stool or on the toilet paper. If you underwent a bowel prep for your procedure, you may not have a normal bowel movement for a few days.  Please Note:  You might notice some irritation and congestion in your nose or some drainage.  This is from the oxygen used during your procedure.  There is no need for concern and it should clear up in a day or so.  SYMPTOMS TO REPORT IMMEDIATELY:   Following lower endoscopy (colonoscopy or flexible sigmoidoscopy):  Excessive amounts of blood in the stool  Significant tenderness or worsening of abdominal pains  Swelling of the abdomen that is new, acute  Fever of 100F or higher   For urgent or emergent issues, a gastroenterologist can be reached at any hour by calling [phone].   DIET:  We do recommend a small meal at first, but then you may proceed to your regular diet.  Drink plenty of fluids but you should avoid alcoholic beverages for 24 hours.  ACTIVITY:  You should plan to take it easy for the rest of today and you should NOT DRIVE or use heavy machinery until tomorrow (because of the sedation medicines used during the test).     FOLLOW UP: Our staff will call the number listed on your records the next business day following your procedure to check on you and address any questions or concerns that you may have regarding the information given to you following your procedure. If we do not reach you, we will leave a message.  However, if you are feeling well and you are not experiencing any problems, there is no need to return our call.  We will assume that you have returned to your regular daily activities without incident.  If any biopsies were taken you will be contacted by phone or by letter within the next 1-3 weeks.  Please call us at [phone] if you have not heard about the biopsies in 3 weeks.    SIGNATURES/CONFIDENTIALITY: You and/or your care partner have signed paperwork which will be entered into your electronic medical record.  These signatures attest to the fact that that the information above on your After Visit Summary has been reviewed and is understood.  Full responsibility of the confidentiality of this discharge information lies with you and/or your care-partner.  Polyp information given. 

## 2018-07-30 NOTE — Progress Notes (Signed)
Office: [phone]  /  Fax: [phone]   HPI:   Chief Complaint: OBESITY Misty Mahoney is here to discuss her progress with her obesity treatment plan. She is on the Category 2 plan or follow the Pescatarian eating plan and is following her eating plan approximately 75 % of the time. She states she is exercising 0 minutes 0 times per week. Misty Mahoney has been on vacation and has increased celebration eating. She is now ready to get back on track and likes to go between the Category 2 plan and the Pescatarian plan.  Her weight is 236 lb (107 kg) today and has gained 3 pounds since her last visit. She has lost 8 lbs since starting treatment with [place].  Pre-Diabetes Misty Mahoney has a diagnosis of pre-diabetes based on her elevated Hgb A1c and was informed this puts her at greater risk of developing diabetes. She is stable on metformin and she denies nausea, vomiting, or hypoglycemia. She still notes some polyphagia especially with increased simple carbohydrates. She continues to work on diet and exercise to decrease risk of diabetes.   ALLERGIES: Allergies  Allergen Reactions  . Sunflower Oil Swelling    Seeds     MEDICATIONS: Current Outpatient Medications on File Prior to Visit  Medication Sig Dispense Refill  . aspirin EC 81 MG tablet Take 1 tablet (81 mg total) by mouth daily. 30 tablet 0  . Cholecalciferol (VITAMIN D) 2000 units CAPS Take 1 capsule by mouth daily.    Marland Kitchen diltiazem (CARDIZEM CD) 180 MG 24 hr capsule Take 1 capsule (180 mg total) by mouth 2 (two) times daily. 180 capsule 3  . ergocalciferol (VITAMIN D2) 50000 units capsule Take 1 capsule (50,000 Units total) by mouth once a week. 12 capsule 1  . escitalopram (LEXAPRO) 10 MG tablet Take 1 tablet (10 mg total) by mouth daily. 90 tablet 1  . flecainide (TAMBOCOR) 150 MG tablet Take 2 tablets (300 mg total) by mouth as needed. 18 tablet 1  . irbesartan (AVAPRO) 300 MG tablet Take 1 tablet (300 mg total) by mouth daily. 90 tablet 1  .  Magnesium 250 MG TABS Take 1 tablet by mouth daily.    . metFORMIN (GLUCOPHAGE) 500 MG tablet Take 1 tablet (500 mg total) by mouth 2 (two) times daily with a meal. 180 tablet 1  . Omega-3 Fatty Acids (FISH OIL PO) Take 1 capsule by mouth daily.    Marland Kitchen triamterene-hydrochlorothiazide (MAXZIDE-25) 37.5-25 MG tablet Take 1 tablet by mouth daily. 90 tablet 1   No current facility-administered medications on file prior to visit.     PAST MEDICAL HISTORY: Past Medical History:  Diagnosis Date  . Anemia    gestational  . Atrial fibrillation ([place])   . Chicken pox as a child  . Depression 06/15/2015  . DM, gestational, diet controlled 04/10/2014   insulin resistant- A1C5.7   . Fatigue   . GERD (gastroesophageal reflux disease)    undx'd GERD   . H/O gestational diabetes mellitus, not currently pregnant 04/10/2014  . Heart murmur   . History of chicken pox   . Hyperlipidemia 12/20/2015  . Hypertension   . Joint pain   . Obesity, unspecified 04/10/2014  . OSA (obstructive sleep apnea)   . Osteoarthritis   . Palpitations   . Prediabetes   . Preventative health care 12/26/2015  . Sleep apnea    wears cpap   . SVT (supraventricular tachycardia) ([place])    s/p RFCA 09/26/12  . Vitamin D deficiency  12/26/2015    PAST SURGICAL HISTORY: Past Surgical History:  Procedure Laterality Date  . ADENOIDECTOMY    . CARDIAC ELECTROPHYSIOLOGY STUDY AND ABLATION  2013  . ENDOVENOUS ABLATION SAPHENOUS VEIN W/ LASER Bilateral    Thermal ablation  . KNEE ARTHROSCOPY WITH MENISCAL REPAIR Left 08/25/2016   Procedure: KNEE ARTHROSCOPY WITH partial medial meniscectomy, chondroplasty;  Surgeon: Melrose Nakayama, MD;  Location: [facility];  Service: Orthopedics;  Laterality: Left;  KNEE ARTHROSCOPY WITH partial medial meniscectomy, chondroplasty  . PALATE / UVULA BIOPSY / EXCISION    . TONSILLECTOMY  52 yrs old  . V-TACH ABLATION N/A 09/26/2012   Procedure: V-TACH ABLATION;  Surgeon: Evans Lance, MD;   Location[facility] C[facility] LAB;  Service: Cardiovascular;  Laterality: N/A;  . WISDOM TOOTH EXTRACTION  52 yrs old    SOCIAL HISTORY: Social History   Tobacco Use  . Smoking status: Never Smoker  . Smokeless tobacco: Never Used  Substance Use Topics  . Alcohol use: Yes    Comment: two or three times a year  . Drug use: No    FAMILY HISTORY: Family History  Problem Relation Age of Onset  . Hypertension Mother   . Dementia Mother   . Stroke Mother   . Dementia Father   . Hypertension Father   . Cataracts Father        bilateral  . Alzheimer's disease Father   . Colon polyps Father   . Cancer Sister 7       metastatic  . Hypertension Brother   . ADD / ADHD Daughter   . ADD / ADHD Son   . Heart attack Maternal Grandfather        pacemaker  . Cancer Maternal Grandfather        ?  Marland Kitchen Cancer Paternal Grandmother        colon  . Colon cancer Maternal Grandmother   . Esophageal cancer Neg Hx   . Rectal cancer Neg Hx   . Stomach cancer Neg Hx     ROS: Review of Systems  Constitutional: Negative for weight loss.  Gastrointestinal: Negative for nausea and vomiting.  Endo/Heme/Allergies:       Negative hypoglycemia Positive polyphagia    PHYSICAL EXAM: Blood pressure 121/71, pulse 64, temperature 98 F (36.7 C), temperature source Oral, height 5\' 8"  (1.727 m), weight 236 lb (107 kg), SpO2 98 %. Body mass index is 35.88 kg/m. Physical Exam  Constitutional: She is oriented to person, place, and time. She appears well-developed and well-nourished.  Cardiovascular: Normal rate.  Pulmonary/Chest: Effort normal.  Musculoskeletal: Normal range of motion.  Neurological: She is oriented to person, place, and time.  Skin: Skin is warm and dry.  Psychiatric: She has a normal mood and affect. Her behavior is normal.  Vitals reviewed.   RECENT LABS AND TESTS: BMET    Component Value Date/Time   NA 141 03/05/2018 0901   K 4.2 03/05/2018 0901   CL 99 03/05/2018 0901   CO2 29  03/05/2018 0901   GLUCOSE 104 (H) 03/05/2018 0901   GLUCOSE 110 (H) 02/05/2018 0951   BUN 23 03/05/2018 0901   CREATININE 0.82 03/05/2018 0901   CREATININE 0.59 06/04/2014 1029   CALCIUM 9.7 03/05/2018 0901   GFRNONAA 83 03/05/2018 0901   GFRAA 95 03/05/2018 0901   Lab Results  Component Value Date   HGBA1C 5.9 (H) 03/05/2018   HGBA1C 6.2 12/13/2017   HGBA1C 5.6 11/07/2016   HGBA1C 5.7 12/20/2015  HGBA1C 5.6 06/15/2015   Lab Results  Component Value Date   INSULIN 20.3 03/05/2018   CBC    Component Value Date/Time   WBC 8.4 03/05/2018 0901   WBC 6.3 12/13/2017 1029   RBC 3.92 03/05/2018 0901   RBC 4.04 12/13/2017 1029   HGB 11.3 03/05/2018 0901   HCT 34.2 03/05/2018 0917   PLT 331 03/05/2018 0901   MCV 89 03/05/2018 0901   MCH 28.8 03/05/2018 0901   MCH 28.6 02/07/2016 1022   MCHC 32.3 03/05/2018 0901   MCHC 32.7 12/13/2017 1029   RDW 13.8 03/05/2018 0901   LYMPHSABS 1.9 03/05/2018 0901   MONOABS 0.3 02/07/2016 1022   EOSABS 0.2 03/05/2018 0901   BASOSABS 0.0 03/05/2018 0901   Iron/TIBC/Ferritin/ %Sat    Component Value Date/Time   IRON 50 03/05/2018 0917   TIBC 316 03/05/2018 0917   FERRITIN 47 03/05/2018 0917   IRONPCTSAT 16 03/05/2018 0917   Lipid Panel     Component Value Date/Time   CHOL 184 03/05/2018 0901   TRIG 87 03/05/2018 0901   HDL 67 03/05/2018 0901   CHOLHDL 2.7 03/05/2018 0901   CHOLHDL 3 12/13/2017 1029   VLDL 13.4 12/13/2017 1029   LDLCALC 100 (H) 03/05/2018 0901   Hepatic Function Panel     Component Value Date/Time   PROT 7.5 03/05/2018 0901   ALBUMIN 4.4 03/05/2018 0901   AST 15 03/05/2018 0901   ALT 14 03/05/2018 0901   ALKPHOS 57 03/05/2018 0901   BILITOT 0.2 03/05/2018 0901   BILIDIR 0.1 06/04/2014 1029   IBILI 0.3 06/04/2014 1029      Component Value Date/Time   TSH 1.900 03/05/2018 0901   TSH 1.19 12/13/2017 1029   TSH 1.00 11/07/2016 0929    ASSESSMENT AND PLAN: Prediabetes  Class 2 severe obesity with  serious comorbidity and body mass index (BMI) of 35.0 to 35.9 in adult, unspecified obesity type ([place])  PLAN:  Pre-Diabetes Misty Mahoney will continue to work on weight loss, diet, exercise, and decreasing simple carbohydrates in her diet to help decrease the risk of diabetes. We dicussed metformin including benefits and risks. She was informed that eating too many simple carbohydrates or too many calories at one sitting increases the likelihood of GI side effects. Misty Mahoney agrees to continue taking metformin and we will recheck labs in 3 weeks. Misty Mahoney agrees to follow up with our clinic in 3 weeks as directed to monitor her progress.  I spent > than 50% of the 15 minute visit on counseling as documented in the note.  Obesity Misty Mahoney is currently in the action stage of change. As such, her goal is to continue with weight loss efforts She has agreed to follow the Category 2 plan or follow the [place] eating plan Misty Mahoney has been instructed to work up to a goal of 150 minutes of combined cardio and strengthening exercise per week for weight loss and overall health benefits. We discussed the following Behavioral Modification Strategies today: increasing lean protein intake, dealing with family or coworker sabotage, travel eating strategies    Misty Mahoney has agreed to follow up with our clinic in 3 weeks. She was informed of the importance of frequent follow up visits to maximize her success with intensive lifestyle modifications for her multiple health conditions.   OBESITY BEHAVIORAL INTERVENTION VISIT  Today's visit was # 8   Starting weight: 244 lbs Starting date: 03/05/18 Today's weight : 236 lbs Today's date: 07/29/2018 Total lbs lost to date: 8  ASK: We discussed the diagnosis of obesity with [name] today and Misty Mahoney agreed to give [place] permission to discuss obesity behavioral modification therapy today.  ASSESS: Misty Mahoney has the diagnosis of obesity and her BMI today is 35.89 Misty Mahoney  is in the action stage of change   ADVISE: Misty Mahoney was educated on the multiple health risks of obesity as well as the benefit of weight loss to improve her health. She was advised of the need for long term treatment and the importance of lifestyle modifications to improve her current health and to decrease her risk of future health problems.  AGREE: Multiple dietary modification options and treatment options were discussed and  Misty Mahoney agreed to follow the recommendations documented in the above note.  ARRANGE: Misty Mahoney was educated on the importance of frequent visits to treat obesity as outlined per CMS and USPSTF guidelines and agreed to schedule her next follow up appointment today.  I, Trixie Dredge, am acting as transcriptionist for Dennard Nip, MD  I have reviewed the above documentation for accuracy and completeness, and I agree with the above. -Dennard Nip, MD

## 2018-07-31 ENCOUNTER — Telehealth: Payer: Self-pay

## 2018-07-31 NOTE — Telephone Encounter (Signed)
  Follow up Call-  Call back number 07/30/2018  Post procedure Call Back phone  # [phone] work number and at work.  Permission to leave phone message No  Some recent data might be hidden     Patient questions:  Do you have a fever, pain , or abdominal swelling? No. Pain Score  0 *  Have you tolerated food without any problems? Yes.    Have you been able to return to your normal activities? Yes.    Do you have any questions about your discharge instructions: Diet   No. Medications  No. Follow up visit  No.  Do you have questions or concerns about your Care? No.  Actions: * If pain score is 4 or above: No action needed, pain <4.

## 2018-08-02 ENCOUNTER — Encounter: Payer: Self-pay | Admitting: Internal Medicine

## 2018-08-09 ENCOUNTER — Other Ambulatory Visit: Payer: Self-pay | Admitting: Family Medicine

## 2018-08-09 ENCOUNTER — Encounter: Payer: Self-pay | Admitting: Family Medicine

## 2018-08-09 DIAGNOSIS — G4730 Sleep apnea, unspecified: Secondary | ICD-10-CM

## 2018-08-09 MED FILL — ESCITALOPRAM 10 MG TABLET: 10 | 90 days supply | Qty: 90 | Fill #0

## 2018-08-21 ENCOUNTER — Encounter: Payer: Self-pay | Admitting: Family Medicine

## 2018-08-21 MED FILL — DOXYCYCLINE HYC 50 MG CAP: 50 | 7 days supply | Qty: 14 | Fill #0

## 2018-08-21 MED FILL — TOBRAMYCIN-DEXAMETH OPTH SU: 0.3-0.1 | 7 days supply | Qty: 5 | Fill #0

## 2018-08-22 ENCOUNTER — Telehealth: Payer: Self-pay

## 2018-08-22 ENCOUNTER — Ambulatory Visit (INDEPENDENT_AMBULATORY_CARE_PROVIDER_SITE_OTHER): Payer: No Typology Code available for payment source | Admitting: Family Medicine

## 2018-08-22 VITALS — BP 134/73 | HR 59 | Temp 98.1°F | Ht 68.0 in | Wt 236.0 lb

## 2018-08-22 DIAGNOSIS — I10 Essential (primary) hypertension: Secondary | ICD-10-CM | POA: Diagnosis not present

## 2018-08-22 DIAGNOSIS — Z6836 Body mass index (BMI) 36.0-36.9, adult: Secondary | ICD-10-CM

## 2018-08-22 DIAGNOSIS — R7303 Prediabetes: Secondary | ICD-10-CM | POA: Diagnosis not present

## 2018-08-22 NOTE — Telephone Encounter (Signed)
patient contacted [place] with eye concern and is seeing opthamology

## 2018-08-22 NOTE — Progress Notes (Signed)
Office: [phone]  /  Fax: [phone]   HPI:   Chief Complaint: OBESITY Misty Mahoney is here to discuss her progress with her obesity treatment plan. She is on the Category 2 plan and is following her eating plan approximately 50 % of the time. She states she is exercising 0 minutes 0 times per week. Misty Mahoney is lacking motivation and feeling like she "just can't get it together." She voices difficulty in planning ahead and prepping.  Her weight is 236 lb (107 kg) today and has not lost weight since her last visit. She has lost 8 lbs since starting treatment with [place].  Hypertension Misty Mahoney Misty Mahoney is a 52 Misty Mahoney.o. female with hypertension. Misty Mahoney denies chest pain, chest pressure, or headaches. She is working on weight loss to help control her blood pressure with the goal of decreasing her risk of heart attack and stroke. Misty Mahoney's blood pressure is currently controlled today.  Pre-Diabetes Misty Mahoney has a diagnosis of pre-diabetes based on her elevated Hgb A1c and was informed this puts her at greater risk of developing diabetes. She is taking metformin currently and continues to work on diet and exercise to decrease risk of diabetes. She admits to having carb cravings.  ALLERGIES: Allergies  Allergen Reactions  . Sunflower Oil Swelling    Seeds     MEDICATIONS: Current Outpatient Medications on File Prior to Visit  Medication Sig Dispense Refill  . aspirin EC 81 MG tablet Take 1 tablet (81 mg total) by mouth daily. 30 tablet 0  . Cholecalciferol (VITAMIN D) 2000 units CAPS Take 1 capsule by mouth daily.    Marland Kitchen diltiazem (CARDIZEM CD) 180 MG 24 hr capsule Take 1 capsule (180 mg total) by mouth 2 (two) times daily. 180 capsule 3  . ergocalciferol (VITAMIN D2) 50000 units capsule Take 1 capsule (50,000 Units total) by mouth once a week. 12 capsule 1  . escitalopram (LEXAPRO) 10 MG tablet Take 1 tablet (10 mg total) by mouth daily. 90 tablet 1  . flecainide (TAMBOCOR) 150 MG tablet Take 2  tablets (300 mg total) by mouth as needed. 18 tablet 1  . irbesartan (AVAPRO) 300 MG tablet Take 1 tablet (300 mg total) by mouth daily. 90 tablet 1  . Magnesium 250 MG TABS Take 1 tablet by mouth daily.    . metFORMIN (GLUCOPHAGE) 500 MG tablet Take 1 tablet (500 mg total) by mouth 2 (two) times daily with a meal. 180 tablet 1  . Omega-3 Fatty Acids (FISH OIL PO) Take 1 capsule by mouth daily.    Marland Kitchen triamterene-hydrochlorothiazide (MAXZIDE-25) 37.5-25 MG tablet Take 1 tablet by mouth daily. 90 tablet 1   No current facility-administered medications on file prior to visit.     PAST MEDICAL HISTORY: Past Medical History:  Diagnosis Date  . Anemia    gestational  . Atrial fibrillation ([place])   . Chicken pox as a child  . Depression 06/15/2015  . DM, gestational, diet controlled 04/10/2014   insulin resistant- A1C5.7   . Fatigue   . GERD (gastroesophageal reflux disease)    undx'd GERD   . H/O gestational diabetes mellitus, not currently pregnant 04/10/2014  . Heart murmur   . History of chicken pox   . Hyperlipidemia 12/20/2015  . Hypertension   . Joint pain   . Obesity, unspecified 04/10/2014  . OSA (obstructive sleep apnea)   . Osteoarthritis   . Palpitations   . Prediabetes   . Preventative health care 12/26/2015  . Sleep apnea  wears cpap   . SVT (supraventricular tachycardia) ([place])    s/p RFCA 09/26/12  . Vitamin D deficiency 12/26/2015    PAST SURGICAL HISTORY: Past Surgical History:  Procedure Laterality Date  . ADENOIDECTOMY    . CARDIAC ELECTROPHYSIOLOGY STUDY AND ABLATION  2013  . ENDOVENOUS ABLATION SAPHENOUS VEIN W/ LASER Bilateral    Thermal ablation  . KNEE ARTHROSCOPY WITH MENISCAL REPAIR Left 08/25/2016   Procedure: KNEE ARTHROSCOPY WITH partial medial meniscectomy, chondroplasty;  Surgeon: Melrose Nakayama, MD;  Location: [facility];  Service: Orthopedics;  Laterality: Left;  KNEE ARTHROSCOPY WITH partial medial meniscectomy, chondroplasty  . PALATE  / UVULA BIOPSY / EXCISION    . TONSILLECTOMY  52 yrs old  . V-TACH ABLATION N/A 09/26/2012   Procedure: V-TACH ABLATION;  Surgeon: Evans Lance, MD;  Location: [facility] CATH LAB;  Service: Cardiovascular;  Laterality: N/A;  . WISDOM TOOTH EXTRACTION  52 yrs old    SOCIAL HISTORY: Social History   Tobacco Use  . Smoking status: Never Smoker  . Smokeless tobacco: Never Used  Substance Use Topics  . Alcohol use: Yes    Comment: two or three times a year  . Drug use: No    FAMILY HISTORY: Family History  Problem Relation Age of Onset  . Hypertension Mother   . Dementia Mother   . Stroke Mother   . Dementia Father   . Hypertension Father   . Cataracts Father        bilateral  . Alzheimer's disease Father   . Colon polyps Father   . Cancer Sister 13       metastatic  . Hypertension Brother   . ADD / ADHD Daughter   . ADD / ADHD Son   . Heart attack Maternal Grandfather        pacemaker  . Cancer Maternal Grandfather        ?  Marland Kitchen Cancer Paternal Grandmother        colon  . Colon cancer Maternal Grandmother   . Esophageal cancer Neg Hx   . Rectal cancer Neg Hx   . Stomach cancer Neg Hx     ROS: Review of Systems  Constitutional: Negative for weight loss.  Cardiovascular: Negative for chest pain.       Negative for chest pressure.  Neurological: Negative for headaches.  Endo/Heme/Allergies:       Positive for carb cravings.    PHYSICAL EXAM: Blood pressure 134/73, pulse (!) 59, temperature 98.1 F (36.7 C), temperature source Oral, height 5\' 8"  (1.727 m), weight 236 lb (107 kg), SpO2 97 %. Body mass index is 35.88 kg/m. Physical Exam  Constitutional: She is oriented to person, place, and time. She appears well-developed and well-nourished.  Cardiovascular: Normal rate.  Pulmonary/Chest: Effort normal.  Musculoskeletal: Normal range of motion.  Neurological: She is oriented to person, place, and time.  Skin: Skin is warm and dry.  Psychiatric: She has a normal  mood and affect. Her behavior is normal.  Vitals reviewed.   RECENT LABS AND TESTS: BMET    Component Value Date/Time   NA 141 03/05/2018 0901   K 4.2 03/05/2018 0901   CL 99 03/05/2018 0901   CO2 29 03/05/2018 0901   GLUCOSE 104 (H) 03/05/2018 0901   GLUCOSE 110 (H) 02/05/2018 0951   BUN 23 03/05/2018 0901   CREATININE 0.82 03/05/2018 0901   CREATININE 0.59 06/04/2014 1029   CALCIUM 9.7 03/05/2018 0901   GFRNONAA 83 03/05/2018 0901  GFRAA 95 03/05/2018 0901   Lab Results  Component Value Date   HGBA1C 5.9 (H) 03/05/2018   HGBA1C 6.2 12/13/2017   HGBA1C 5.6 11/07/2016   HGBA1C 5.7 12/20/2015   HGBA1C 5.6 06/15/2015   Lab Results  Component Value Date   INSULIN 20.3 03/05/2018   CBC    Component Value Date/Time   WBC 8.4 03/05/2018 0901   WBC 6.3 12/13/2017 1029   RBC 3.92 03/05/2018 0901   RBC 4.04 12/13/2017 1029   HGB 11.3 03/05/2018 0901   HCT 34.2 03/05/2018 0917   PLT 331 03/05/2018 0901   MCV 89 03/05/2018 0901   MCH 28.8 03/05/2018 0901   MCH 28.6 02/07/2016 1022   MCHC 32.3 03/05/2018 0901   MCHC 32.7 12/13/2017 1029   RDW 13.8 03/05/2018 0901   LYMPHSABS 1.9 03/05/2018 0901   MONOABS 0.3 02/07/2016 1022   EOSABS 0.2 03/05/2018 0901   BASOSABS 0.0 03/05/2018 0901   Iron/TIBC/Ferritin/ %Sat    Component Value Date/Time   IRON 50 03/05/2018 0917   TIBC 316 03/05/2018 0917   FERRITIN 47 03/05/2018 0917   IRONPCTSAT 16 03/05/2018 0917   Lipid Panel     Component Value Date/Time   CHOL 184 03/05/2018 0901   TRIG 87 03/05/2018 0901   HDL 67 03/05/2018 0901   CHOLHDL 2.7 03/05/2018 0901   CHOLHDL 3 12/13/2017 1029   VLDL 13.4 12/13/2017 1029   LDLCALC 100 (H) 03/05/2018 0901   Hepatic Function Panel     Component Value Date/Time   PROT 7.5 03/05/2018 0901   ALBUMIN 4.4 03/05/2018 0901   AST 15 03/05/2018 0901   ALT 14 03/05/2018 0901   ALKPHOS 57 03/05/2018 0901   BILITOT 0.2 03/05/2018 0901   BILIDIR 0.1 06/04/2014 1029   IBILI 0.3  06/04/2014 1029      Component Value Date/Time   TSH 1.900 03/05/2018 0901   TSH 1.19 12/13/2017 1029   TSH 1.00 11/07/2016 0929   Results for MARIYA, MOTTLEY (MRN [id number]) as of 08/22/2018 13:45  Ref. Range 03/05/2018 09:01  Vitamin D, 25-Hydroxy Latest Ref Range: 30.0 - 100.0 ng/mL 27.9 (L)    ASSESSMENT AND PLAN: Essential hypertension - Plan: Comprehensive metabolic panel, Lipid panel, VITAMIN D 25 Hydroxy (Vit-D Deficiency, Fractures)  Prediabetes - Plan: Hemoglobin A1c, Insulin, random, Vitamin B12  Class 2 severe obesity with serious comorbidity and body mass index (BMI) of 36.0 to 36.9 in adult, unspecified obesity type (HCC)  PLAN:  Hypertension We discussed sodium restriction, working on healthy weight loss, and a regular exercise program as the means to achieve improved blood pressure control. Misty Mahoney agreed with this plan and agreed to follow up as directed. We will continue to monitor her blood pressure as well as her progress with the above lifestyle modifications. She will continue her medications as prescribed and will watch for signs of hypotension as she continues her lifestyle modifications. We will draw a FLP and CMP today.   Pre-Diabetes Misty Mahoney will continue to work on weight loss, exercise, and decreasing simple carbohydrates in her diet to help decrease the risk of diabetes. She was informed that eating too many simple carbohydrates or too many calories at one sitting increases the likelihood of GI side effects. We will check her Hgb A1c, Insulin, and B12 levels today. Misty Mahoney agreed to follow up with [place] as directed to monitor her progress.  Obesity Misty Mahoney is currently in the action stage of change.  As such, her goal is to continue  with weight loss efforts. She has agreed to follow the Category 2 plan. Misty Mahoney agrees to follow the plan 4 of 7 days. Misty Mahoney has been instructed to go to the [facility] once a week and work up to a goal of 150 minutes of combined cardio  and strengthening exercise per week for weight loss and overall health benefits. We discussed the following Behavioral Modification Strategies today: increasing lean protein intake, increasing vegetables, work on meal planning and easy cooking plans, and planning for success.  Misty Mahoney has agreed to follow up with our clinic in 2 weeks. She was informed of the importance of frequent follow up visits to maximize her success with intensive lifestyle modifications for her multiple health conditions.   OBESITY BEHAVIORAL INTERVENTION VISIT  Today's visit was # 9  Starting weight: 244 lbs Starting date: 03/05/18 Today's weight : Weight: 236 lb (107 kg)  Today's date: 08/22/2018 Total lbs lost to date: 8  ASK: We discussed the diagnosis of obesity with Misty Mahoney today and Misty Mahoney agreed to give [place] permission to discuss obesity behavioral modification therapy today.  ASSESS: Misty Mahoney has the diagnosis of obesity and her BMI today is 35.89. Misty Mahoney is in the action stage of change.   ADVISE: Misty Mahoney was educated on the multiple health risks of obesity as well as the benefit of weight loss to improve her health. She was advised of the need for long term treatment and the importance of lifestyle modifications to improve her current health and to decrease her risk of future health problems.  AGREE: Multiple dietary modification options and treatment options were discussed and Yulanda agreed to follow the recommendations documented in the above note.  ARRANGE: Norinne was educated on the importance of frequent visits to treat obesity as outlined per CMS and USPSTF guidelines and agreed to schedule her next follow up appointment today.  I, Marcille Blanco, am acting as Location manager for Eber Jones, MD  I have reviewed the above documentation for accuracy and completeness, and I agree with the above. - Ilene Qua, MD

## 2018-08-23 LAB — LIPID PANEL
Chol/HDL Ratio: 3.2 ratio (ref 0.0–4.4)
Cholesterol, Total: 177 mg/dL (ref 100–199)
HDL: 55 mg/dL (ref >39)
LDL Calculated: 100 mg/dL — ABNORMAL HIGH (ref 0–99)
Triglycerides: 109 mg/dL (ref 0–149)
VLDL Cholesterol Cal: 22 mg/dL (ref 5–40)

## 2018-08-23 LAB — COMPREHENSIVE METABOLIC PANEL
A/G RATIO: 1.4 (ref 1.2–2.2)
ALBUMIN: 4 g/dL (ref 3.5–5.5)
ALT: 12 IU/L (ref 0–32)
AST: 13 IU/L (ref 0–40)
Alkaline Phosphatase: 49 IU/L (ref 39–117)
BUN/Creatinine Ratio: 33 — ABNORMAL HIGH (ref 9–23)
BUN: 26 mg/dL — ABNORMAL HIGH (ref 6–24)
Bilirubin Total: <0.2 mg/dL (ref 0.0–1.2)
CO2: 22 mmol/L (ref 20–29)
CREATININE: 0.8 mg/dL (ref 0.57–1.00)
Calcium: 9.5 mg/dL (ref 8.7–10.2)
Chloride: 105 mmol/L (ref 96–106)
GFR, EST AFRICAN AMERICAN: 97 mL/min/{1.73_m2} (ref >59)
GFR, EST NON AFRICAN AMERICAN: 84 mL/min/{1.73_m2} (ref >59)
GLOBULIN, TOTAL: 2.9 g/dL (ref 1.5–4.5)
Glucose: 104 mg/dL — ABNORMAL HIGH (ref 65–99)
POTASSIUM: 4.2 mmol/L (ref 3.5–5.2)
SODIUM: 145 mmol/L — AB (ref 134–144)
TOTAL PROTEIN: 6.9 g/dL (ref 6.0–8.5)

## 2018-08-23 LAB — HEMOGLOBIN A1C
ESTIMATED AVERAGE GLUCOSE: 123 mg/dL
Hgb A1c MFr Bld: 5.9 % — ABNORMAL HIGH (ref 4.8–5.6)

## 2018-08-23 LAB — VITAMIN B12: VITAMIN B 12: 429 pg/mL (ref 232–1245)

## 2018-08-23 LAB — INSULIN, RANDOM: INSULIN: 12.4 u[IU]/mL (ref 2.6–24.9)

## 2018-08-23 LAB — VITAMIN D 25 HYDROXY (VIT D DEFICIENCY, FRACTURES): VIT D 25 HYDROXY: 35.6 ng/mL (ref 30.0–100.0)

## 2018-08-23 NOTE — Telephone Encounter (Signed)
Done

## 2018-09-05 ENCOUNTER — Ambulatory Visit (INDEPENDENT_AMBULATORY_CARE_PROVIDER_SITE_OTHER): Payer: No Typology Code available for payment source | Admitting: Family Medicine

## 2018-09-05 VITALS — BP 130/78 | HR 71 | Temp 98.0°F | Ht 68.0 in | Wt 232.0 lb

## 2018-09-05 DIAGNOSIS — Z6835 Body mass index (BMI) 35.0-35.9, adult: Secondary | ICD-10-CM

## 2018-09-05 DIAGNOSIS — R7303 Prediabetes: Secondary | ICD-10-CM | POA: Diagnosis not present

## 2018-09-05 DIAGNOSIS — E559 Vitamin D deficiency, unspecified: Secondary | ICD-10-CM

## 2018-09-05 DIAGNOSIS — Z9189 Other specified personal risk factors, not elsewhere classified: Secondary | ICD-10-CM | POA: Diagnosis not present

## 2018-09-05 DIAGNOSIS — E66812 Obesity, class 2: Secondary | ICD-10-CM

## 2018-09-05 DIAGNOSIS — I10 Essential (primary) hypertension: Secondary | ICD-10-CM | POA: Diagnosis not present

## 2018-09-05 MED ORDER — VITAMIN D (ERGOCALCIFEROL) 1.25 MG (50000 UNIT) PO CAPS: 50000.0000 [IU] | ORAL_CAPSULE | ORAL | 0 refills | Status: DC

## 2018-09-09 ENCOUNTER — Ambulatory Visit (INDEPENDENT_AMBULATORY_CARE_PROVIDER_SITE_OTHER): Payer: No Typology Code available for payment source

## 2018-09-09 DIAGNOSIS — Z23 Encounter for immunization: Secondary | ICD-10-CM | POA: Diagnosis not present

## 2018-09-09 NOTE — Progress Notes (Signed)
Office: [phone]  /  Fax: [phone]   HPI:   Chief Complaint: OBESITY Misty Mahoney is here to discuss her progress with her obesity treatment plan. She is on the Category 2 plan and is following her eating plan approximately 75 % of the time. She states she is walking 2-3 miles 3 times per week. Misty Mahoney is doing some intermittent fasting from 7pm to 11am. She is getting all food in between 11am and 7pm. She is going to the beach this weekend.  Her weight is 232 lb (105.2 kg) today and has had a weight loss of 4 pounds over a period of 2 weeks since her last visit. She has lost 12 lbs since starting treatment with [place].  Hypertension Misty Mahoney is a 52 y.o. female with hypertension. Misty Mahoney's blood pressure is controlled. She denies chest pain, chest pressure, or headaches. She is working weight loss to help control her blood pressure with the goal of decreasing her risk of heart attack and stroke.   Pre-Diabetes Misty Mahoney has a diagnosis of pre-diabetes based on her elevated Hgb A1c and was informed this puts her at greater risk of developing diabetes. She is on metformin but she is not always taking BID dosing. She denies GI side effects of metformin and continues to work on diet and exercise to decrease risk of diabetes. She denies hypoglycemia.  Vitamin D Deficiency Misty Mahoney has a diagnosis of vitamin D deficiency. She is currently taking prescription Vit D. She notes fatigue and denies nausea, vomiting or muscle weakness.  At risk for osteopenia and osteoporosis Misty Mahoney is at higher risk of osteopenia and osteoporosis due to vitamin D deficiency.   ALLERGIES: Allergies  Allergen Reactions  . Sunflower Oil Swelling    Seeds     MEDICATIONS: Current Outpatient Medications on File Prior to Visit  Medication Sig Dispense Refill  . aspirin EC 81 MG tablet Take 1 tablet (81 mg total) by mouth daily. 30 tablet 0  . Cholecalciferol (VITAMIN D) 2000 units CAPS Take 1 capsule by mouth  daily.    Marland Kitchen diltiazem (CARDIZEM CD) 180 MG 24 hr capsule Take 1 capsule (180 mg total) by mouth 2 (two) times daily. 180 capsule 3  . ergocalciferol (VITAMIN D2) 50000 units capsule Take 1 capsule (50,000 Units total) by mouth once a week. 12 capsule 1  . escitalopram (LEXAPRO) 10 MG tablet Take 1 tablet (10 mg total) by mouth daily. 90 tablet 1  . flecainide (TAMBOCOR) 150 MG tablet Take 2 tablets (300 mg total) by mouth as needed. 18 tablet 1  . irbesartan (AVAPRO) 300 MG tablet Take 1 tablet (300 mg total) by mouth daily. 90 tablet 1  . Magnesium 250 MG TABS Take 1 tablet by mouth daily.    . metFORMIN (GLUCOPHAGE) 500 MG tablet Take 1 tablet (500 mg total) by mouth 2 (two) times daily with a meal. 180 tablet 1  . Omega-3 Fatty Acids (FISH OIL PO) Take 1 capsule by mouth daily.    Marland Kitchen triamterene-hydrochlorothiazide (MAXZIDE-25) 37.5-25 MG tablet Take 1 tablet by mouth daily. 90 tablet 1   No current facility-administered medications on file prior to visit.     PAST MEDICAL HISTORY: Past Medical History:  Diagnosis Date  . Anemia    gestational  . Atrial fibrillation ([place])   . Chicken pox as a child  . Depression 06/15/2015  . DM, gestational, diet controlled 04/10/2014   insulin resistant- A1C5.7   . Fatigue   . GERD (gastroesophageal reflux  disease)    undx'd GERD   . H/O gestational diabetes mellitus, not currently pregnant 04/10/2014  . Heart murmur   . History of chicken pox   . Hyperlipidemia 12/20/2015  . Hypertension   . Joint pain   . Obesity, unspecified 04/10/2014  . OSA (obstructive sleep apnea)   . Osteoarthritis   . Palpitations   . Prediabetes   . Preventative health care 12/26/2015  . Sleep apnea    wears cpap   . SVT (supraventricular tachycardia) ([place])    s/p RFCA 09/26/12  . Vitamin D deficiency 12/26/2015    PAST SURGICAL HISTORY: Past Surgical History:  Procedure Laterality Date  . ADENOIDECTOMY    . CARDIAC ELECTROPHYSIOLOGY STUDY AND ABLATION  2013  .  ENDOVENOUS ABLATION SAPHENOUS VEIN W/ LASER Bilateral    Thermal ablation  . KNEE ARTHROSCOPY WITH MENISCAL REPAIR Left 08/25/2016   Procedure: KNEE ARTHROSCOPY WITH partial medial meniscectomy, chondroplasty;  Surgeon: Melrose Nakayama, MD;  Location: [facility];  Service: Orthopedics;  Laterality: Left;  KNEE ARTHROSCOPY WITH partial medial meniscectomy, chondroplasty  . PALATE / UVULA BIOPSY / EXCISION    . TONSILLECTOMY  52 yrs old  . V-TACH ABLATION N/A 09/26/2012   Procedure: V-TACH ABLATION;  Surgeon: Evans Lance, MD;  Location: [facility] CATH LAB;  Service: Cardiovascular;  Laterality: N/A;  . WISDOM TOOTH EXTRACTION  52 yrs old    SOCIAL HISTORY: Social History   Tobacco Use  . Smoking status: Never Smoker  . Smokeless tobacco: Never Used  Substance Use Topics  . Alcohol use: Yes    Comment: two or three times a year  . Drug use: No    FAMILY HISTORY: Family History  Problem Relation Age of Onset  . Hypertension Mother   . Dementia Mother   . Stroke Mother   . Dementia Father   . Hypertension Father   . Cataracts Father        bilateral  . Alzheimer's disease Father   . Colon polyps Father   . Cancer Sister 91       metastatic  . Hypertension Brother   . ADD / ADHD Daughter   . ADD / ADHD Son   . Heart attack Maternal Grandfather        pacemaker  . Cancer Maternal Grandfather        ?  Marland Kitchen Cancer Paternal Grandmother        colon  . Colon cancer Maternal Grandmother   . Esophageal cancer Neg Hx   . Rectal cancer Neg Hx   . Stomach cancer Neg Hx     ROS: Review of Systems  Constitutional: Positive for malaise/fatigue and weight loss.  Cardiovascular: Negative for chest pain.       Negative chest pressure  Gastrointestinal: Negative for nausea and vomiting.  Musculoskeletal:       Negative muscle weakness  Neurological: Negative for headaches.  Endo/Heme/Allergies:       Negative hypoglycemia    PHYSICAL EXAM: Blood pressure 130/78, pulse  71, temperature 98 F (36.7 C), temperature source Oral, height 5\' 8"  (1.727 m), weight 232 lb (105.2 kg), SpO2 96 %. Body mass index is 35.28 kg/m. Physical Exam  Constitutional: She is oriented to person, place, and time. She appears well-developed and well-nourished.  Cardiovascular: Normal rate.  Pulmonary/Chest: Effort normal.  Musculoskeletal: Normal range of motion.  Neurological: She is oriented to person, place, and time.  Skin: Skin is warm and dry.  Psychiatric: She  has a normal mood and affect. Her behavior is normal.  Vitals reviewed.   RECENT LABS AND TESTS: BMET    Component Value Date/Time   NA 145 (H) 08/22/2018 1015   K 4.2 08/22/2018 1015   CL 105 08/22/2018 1015   CO2 22 08/22/2018 1015   GLUCOSE 104 (H) 08/22/2018 1015   GLUCOSE 110 (H) 02/05/2018 0951   BUN 26 (H) 08/22/2018 1015   CREATININE 0.80 08/22/2018 1015   CREATININE 0.59 06/04/2014 1029   CALCIUM 9.5 08/22/2018 1015   GFRNONAA 84 08/22/2018 1015   GFRAA 97 08/22/2018 1015   Lab Results  Component Value Date   HGBA1C 5.9 (H) 08/22/2018   HGBA1C 5.9 (H) 03/05/2018   HGBA1C 6.2 12/13/2017   HGBA1C 5.6 11/07/2016   HGBA1C 5.7 12/20/2015   Lab Results  Component Value Date   INSULIN 12.4 08/22/2018   INSULIN 20.3 03/05/2018   CBC    Component Value Date/Time   WBC 8.4 03/05/2018 0901   WBC 6.3 12/13/2017 1029   RBC 3.92 03/05/2018 0901   RBC 4.04 12/13/2017 1029   HGB 11.3 03/05/2018 0901   HCT 34.2 03/05/2018 0917   PLT 331 03/05/2018 0901   MCV 89 03/05/2018 0901   MCH 28.8 03/05/2018 0901   MCH 28.6 02/07/2016 1022   MCHC 32.3 03/05/2018 0901   MCHC 32.7 12/13/2017 1029   RDW 13.8 03/05/2018 0901   LYMPHSABS 1.9 03/05/2018 0901   MONOABS 0.3 02/07/2016 1022   EOSABS 0.2 03/05/2018 0901   BASOSABS 0.0 03/05/2018 0901   Iron/TIBC/Ferritin/ %Sat    Component Value Date/Time   IRON 50 03/05/2018 0917   TIBC 316 03/05/2018 0917   FERRITIN 47 03/05/2018 0917   IRONPCTSAT  16 03/05/2018 0917   Lipid Panel     Component Value Date/Time   CHOL 177 08/22/2018 1015   TRIG 109 08/22/2018 1015   HDL 55 08/22/2018 1015   CHOLHDL 3.2 08/22/2018 1015   CHOLHDL 3 12/13/2017 1029   VLDL 13.4 12/13/2017 1029   LDLCALC 100 (H) 08/22/2018 1015   Hepatic Function Panel     Component Value Date/Time   PROT 6.9 08/22/2018 1015   ALBUMIN 4.0 08/22/2018 1015   AST 13 08/22/2018 1015   ALT 12 08/22/2018 1015   ALKPHOS 49 08/22/2018 1015   BILITOT <0.2 08/22/2018 1015   BILIDIR 0.1 06/04/2014 1029   IBILI 0.3 06/04/2014 1029      Component Value Date/Time   TSH 1.900 03/05/2018 0901   TSH 1.19 12/13/2017 1029   TSH 1.00 11/07/2016 0929  Results for JAX, ABDELRAHMAN (MRN [id number]) as of 09/09/2018 14:26  Ref. Range 08/22/2018 10:15  Vitamin D, 25-Hydroxy Latest Ref Range: 30.0 - 100.0 ng/mL 35.6    ASSESSMENT AND PLAN: Essential hypertension  Prediabetes  Vitamin D deficiency - Plan: Vitamin D, Ergocalciferol, (DRISDOL) 50000 units CAPS capsule  At risk for osteoporosis  Class 2 severe obesity with serious comorbidity and body mass index (BMI) of 35.0 to 35.9 in adult, unspecified obesity type ([place])  PLAN:  Hypertension We discussed sodium restriction, working on healthy weight loss, and a regular exercise program as the means to achieve improved blood pressure control. Misty Mahoney agreed with this plan and agreed to follow up as directed. We will continue to monitor her blood pressure as well as her progress with the above lifestyle modifications. Misty Mahoney agrees to continue her blood pressure medications and will watch for signs of hypotension as she continues her lifestyle modifications.  Misty Mahoney agrees to follow up with our clinic in 2 weeks.  Pre-Diabetes Misty Mahoney will continue to work on weight loss, exercise, and decreasing simple carbohydrates in her diet to help decrease the risk of diabetes. We dicussed metformin including benefits and risks. She was  informed that eating too many simple carbohydrates or too many calories at one sitting increases the likelihood of GI side effects. Misty Mahoney agrees to continue taking metformin and she agrees to follow up with our clinic in 2 weeks as directed to monitor her progress.  Vitamin D Deficiency Misty Mahoney was informed that low vitamin D levels contributes to fatigue and are associated with obesity, breast, and colon cancer. Misty Mahoney agrees to continue taking prescription Vit D @50 ,000 IU every week #4 and we will refill for 1 month. She will follow up for routine testing of vitamin D, at least 2-3 times per year. She was informed of the risk of over-replacement of vitamin D and agrees to not increase her dose unless she discusses this with [place] first. Misty Mahoney agrees to follow up with our clinic in 2 weeks.  At risk for osteopenia and osteoporosis Misty Mahoney was given extended (15 minutes) osteoporosis prevention counseling today. Misty Mahoney is at risk for osteopenia and osteoporsis due to her vitamin D deficiency. She was encouraged to take her vitamin D and follow her higher calcium diet and increase strengthening exercise to help strengthen her bones and decrease her risk of osteopenia and osteoporosis.  Obesity Misty Mahoney is currently in the action stage of change. As such, her goal is to continue with weight loss efforts She has agreed to follow the Category 2 plan Misty Mahoney has been instructed to work up to a goal of 150 minutes of combined cardio and strengthening exercise per week for weight loss and overall health benefits. We discussed the following Behavioral Modification Strategies today: increasing lean protein intake, increasing vegetables, work on meal planning and easy cooking plans, and planning for success   Misty Mahoney has agreed to follow up with our clinic in 2 weeks. She was informed of the importance of frequent follow up visits to maximize her success with intensive lifestyle modifications for her multiple health  conditions.   OBESITY BEHAVIORAL INTERVENTION VISIT  Today's visit was # 10   Starting weight: 244 lbs Starting date: 03/05/18 Today's weight : 232 lbs Today's date: 09/05/2018 Total lbs lost to date: 12    ASK: We discussed the diagnosis of obesity with Misty Mahoney today and Misty Mahoney agreed to give [place] permission to discuss obesity behavioral modification therapy today.  ASSESS: Misty Mahoney has the diagnosis of obesity and her BMI today is 35.28 Misty Mahoney is in the action stage of change   ADVISE: Misty Mahoney was educated on the multiple health risks of obesity as well as the benefit of weight loss to improve her health. She was advised of the need for long term treatment and the importance of lifestyle modifications to improve her current health and to decrease her risk of future health problems.  AGREE: Multiple dietary modification options and treatment options were discussed and  Latamara agreed to follow the recommendations documented in the above note.  ARRANGE: Klee was educated on the importance of frequent visits to treat obesity as outlined per CMS and USPSTF guidelines and agreed to schedule her next follow up appointment today.  I, Trixie Dredge, am acting as transcriptionist for Ilene Qua, MD  I have reviewed the above documentation for accuracy and completeness, and I agree with the above. Olam Idler  Adair Patter, MD

## 2018-09-23 MED FILL — TRIAMTERENE/HCTZ 37.5/25 TB: 37.5-25 | 90 days supply | Qty: 90 | Fill #1

## 2018-09-23 MED FILL — VIT D2 1.25 MG (50,000 UNIT: 1.25 MG | 84 days supply | Qty: 12 | Fill #1

## 2018-09-23 MED FILL — IRBESARTAN 300 MG TAB: 300 | 90 days supply | Qty: 90 | Fill #1

## 2018-09-25 ENCOUNTER — Ambulatory Visit (INDEPENDENT_AMBULATORY_CARE_PROVIDER_SITE_OTHER): Payer: No Typology Code available for payment source | Admitting: Family Medicine

## 2018-10-02 ENCOUNTER — Ambulatory Visit (INDEPENDENT_AMBULATORY_CARE_PROVIDER_SITE_OTHER): Payer: No Typology Code available for payment source | Admitting: Physician Assistant

## 2018-10-02 ENCOUNTER — Encounter (INDEPENDENT_AMBULATORY_CARE_PROVIDER_SITE_OTHER): Payer: Self-pay | Admitting: Physician Assistant

## 2018-10-02 VITALS — BP 131/85 | HR 61 | Temp 98.5°F | Ht 68.0 in | Wt 242.0 lb

## 2018-10-02 DIAGNOSIS — Z6836 Body mass index (BMI) 36.0-36.9, adult: Secondary | ICD-10-CM | POA: Diagnosis not present

## 2018-10-02 DIAGNOSIS — E559 Vitamin D deficiency, unspecified: Secondary | ICD-10-CM

## 2018-10-02 NOTE — Progress Notes (Signed)
Office: [phone]  /  Fax: [phone]   HPI:   Chief Complaint: OBESITY Conchita is here to discuss her progress with her obesity treatment plan. She is on the Category 2 plan and is following her eating plan approximately 50 % of the time. She states she is doing yoga and resistance bands for 90 minutes 2 times per week. Jomayra reports struggling with the plan due to traveling to the beach and stress at home. She has been eating more simple carbohydrates and not getting her protein in.  Her weight is 242 lb (109.8 kg) today and has gained 10 pounds since her last visit. She has lost 2 lbs since starting treatment with [place].  Vitamin D Deficiency Allayna has a diagnosis of vitamin D deficiency. She is on OTC Vit D and denies nausea, vomiting or muscle weakness.  ALLERGIES: Allergies  Allergen Reactions  . Sunflower Oil Swelling    Seeds     MEDICATIONS: Current Outpatient Medications on File Prior to Visit  Medication Sig Dispense Refill  . aspirin EC 81 MG tablet Take 1 tablet (81 mg total) by mouth daily. 30 tablet 0  . Cholecalciferol (VITAMIN D) 2000 units CAPS Take 1 capsule by mouth daily.    Marland Kitchen diltiazem (CARDIZEM CD) 180 MG 24 hr capsule Take 1 capsule (180 mg total) by mouth 2 (two) times daily. 180 capsule 3  . ergocalciferol (VITAMIN D2) 50000 units capsule Take 1 capsule (50,000 Units total) by mouth once a week. 12 capsule 1  . escitalopram (LEXAPRO) 10 MG tablet Take 1 tablet (10 mg total) by mouth daily. 90 tablet 1  . flecainide (TAMBOCOR) 150 MG tablet Take 2 tablets (300 mg total) by mouth as needed. 18 tablet 1  . irbesartan (AVAPRO) 300 MG tablet Take 1 tablet (300 mg total) by mouth daily. 90 tablet 1  . Magnesium 250 MG TABS Take 1 tablet by mouth daily.    . metFORMIN (GLUCOPHAGE) 500 MG tablet Take 1 tablet (500 mg total) by mouth 2 (two) times daily with a meal. 180 tablet 1  . Omega-3 Fatty Acids (FISH OIL PO) Take 1 capsule by mouth daily.    Marland Kitchen  triamterene-hydrochlorothiazide (MAXZIDE-25) 37.5-25 MG tablet Take 1 tablet by mouth daily. 90 tablet 1  . Vitamin D, Ergocalciferol, (DRISDOL) 50000 units CAPS capsule Take 1 capsule (50,000 Units total) by mouth every 7 (seven) days. 4 capsule 0   No current facility-administered medications on file prior to visit.     PAST MEDICAL HISTORY: Past Medical History:  Diagnosis Date  . Anemia    gestational  . Atrial fibrillation ([place])   . Chicken pox as a child  . Depression 06/15/2015  . DM, gestational, diet controlled 04/10/2014   insulin resistant- A1C5.7   . Fatigue   . GERD (gastroesophageal reflux disease)    undx'd GERD   . H/O gestational diabetes mellitus, not currently pregnant 04/10/2014  . Heart murmur   . History of chicken pox   . Hyperlipidemia 12/20/2015  . Hypertension   . Joint pain   . Obesity, unspecified 04/10/2014  . OSA (obstructive sleep apnea)   . Osteoarthritis   . Palpitations   . Prediabetes   . Preventative health care 12/26/2015  . Sleep apnea    wears cpap   . SVT (supraventricular tachycardia) ([place])    s/p RFCA 09/26/12  . Vitamin D deficiency 12/26/2015    PAST SURGICAL HISTORY: Past Surgical History:  Procedure Laterality Date  .  ADENOIDECTOMY    . CARDIAC ELECTROPHYSIOLOGY STUDY AND ABLATION  2013  . ENDOVENOUS ABLATION SAPHENOUS VEIN W/ LASER Bilateral    Thermal ablation  . KNEE ARTHROSCOPY WITH MENISCAL REPAIR Left 08/25/2016   Procedure: KNEE ARTHROSCOPY WITH partial medial meniscectomy, chondroplasty;  Surgeon: Melrose Nakayama, MD;  Location: [facility];  Service: Orthopedics;  Laterality: Left;  KNEE ARTHROSCOPY WITH partial medial meniscectomy, chondroplasty  . PALATE / UVULA BIOPSY / EXCISION    . TONSILLECTOMY  52 yrs old  . V-TACH ABLATION N/A 09/26/2012   Procedure: V-TACH ABLATION;  Surgeon: Evans Lance, MD;  Location[facility] C[facility] LAB;  Service: Cardiovascular;  Laterality: N/A;  . WISDOM TOOTH EXTRACTION  52 yrs old      SOCIAL HISTORY: Social History   Tobacco Use  . Smoking status: Never Smoker  . Smokeless tobacco: Never Used  Substance Use Topics  . Alcohol use: Yes    Comment: two or three times a year  . Drug use: No    FAMILY HISTORY: Family History  Problem Relation Age of Onset  . Hypertension Mother   . Dementia Mother   . Stroke Mother   . Dementia Father   . Hypertension Father   . Cataracts Father        bilateral  . Alzheimer's disease Father   . Colon polyps Father   . Cancer Sister 75       metastatic  . Hypertension Brother   . ADD / ADHD Daughter   . ADD / ADHD Son   . Heart attack Maternal Grandfather        pacemaker  . Cancer Maternal Grandfather        ?  Marland Kitchen Cancer Paternal Grandmother        colon  . Colon cancer Maternal Grandmother   . Esophageal cancer Neg Hx   . Rectal cancer Neg Hx   . Stomach cancer Neg Hx     ROS: Review of Systems  Constitutional: Negative for weight loss.  Gastrointestinal: Negative for nausea and vomiting.  Musculoskeletal:       Negative muscle weakness    PHYSICAL EXAM: Blood pressure 131/85, pulse 61, temperature 98.5 F (36.9 C), temperature source Oral, height 5\' 8"  (1.727 m), weight 242 lb (109.8 kg), SpO2 98 %. Body mass index is 36.8 kg/m. Physical Exam  Constitutional: She is oriented to person, place, and time. She appears well-developed and well-nourished.  Cardiovascular: Normal rate.  Pulmonary/Chest: Effort normal.  Musculoskeletal: Normal range of motion.  Neurological: She is oriented to person, place, and time.  Skin: Skin is warm and dry.  Psychiatric: She has a normal mood and affect. Her behavior is normal.  Vitals reviewed.   RECENT LABS AND TESTS: BMET    Component Value Date/Time   NA 145 (H) 08/22/2018 1015   K 4.2 08/22/2018 1015   CL 105 08/22/2018 1015   CO2 22 08/22/2018 1015   GLUCOSE 104 (H) 08/22/2018 1015   GLUCOSE 110 (H) 02/05/2018 0951   BUN 26 (H) 08/22/2018 1015    CREATININE 0.80 08/22/2018 1015   CREATININE 0.59 06/04/2014 1029   CALCIUM 9.5 08/22/2018 1015   GFRNONAA 84 08/22/2018 1015   GFRAA 97 08/22/2018 1015   Lab Results  Component Value Date   HGBA1C 5.9 (H) 08/22/2018   HGBA1C 5.9 (H) 03/05/2018   HGBA1C 6.2 12/13/2017   HGBA1C 5.6 11/07/2016   HGBA1C 5.7 12/20/2015   Lab Results  Component Value Date  INSULIN 12.4 08/22/2018   INSULIN 20.3 03/05/2018   CBC    Component Value Date/Time   WBC 8.4 03/05/2018 0901   WBC 6.3 12/13/2017 1029   RBC 3.92 03/05/2018 0901   RBC 4.04 12/13/2017 1029   HGB 11.3 03/05/2018 0901   HCT 34.2 03/05/2018 0917   PLT 331 03/05/2018 0901   MCV 89 03/05/2018 0901   MCH 28.8 03/05/2018 0901   MCH 28.6 02/07/2016 1022   MCHC 32.3 03/05/2018 0901   MCHC 32.7 12/13/2017 1029   RDW 13.8 03/05/2018 0901   LYMPHSABS 1.9 03/05/2018 0901   MONOABS 0.3 02/07/2016 1022   EOSABS 0.2 03/05/2018 0901   BASOSABS 0.0 03/05/2018 0901   Iron/TIBC/Ferritin/ %Sat    Component Value Date/Time   IRON 50 03/05/2018 0917   TIBC 316 03/05/2018 0917   FERRITIN 47 03/05/2018 0917   IRONPCTSAT 16 03/05/2018 0917   Lipid Panel     Component Value Date/Time   CHOL 177 08/22/2018 1015   TRIG 109 08/22/2018 1015   HDL 55 08/22/2018 1015   CHOLHDL 3.2 08/22/2018 1015   CHOLHDL 3 12/13/2017 1029   VLDL 13.4 12/13/2017 1029   LDLCALC 100 (H) 08/22/2018 1015   Hepatic Function Panel     Component Value Date/Time   PROT 6.9 08/22/2018 1015   ALBUMIN 4.0 08/22/2018 1015   AST 13 08/22/2018 1015   ALT 12 08/22/2018 1015   ALKPHOS 49 08/22/2018 1015   BILITOT <0.2 08/22/2018 1015   BILIDIR 0.1 06/04/2014 1029   IBILI 0.3 06/04/2014 1029      Component Value Date/Time   TSH 1.900 03/05/2018 0901   TSH 1.19 12/13/2017 1029   TSH 1.00 11/07/2016 0929  Results for ALEIDA, CRANDELL (MRN [id number]) as of 10/02/2018 14:44  Ref. Range 08/22/2018 10:15  Vitamin D, 25-Hydroxy Latest Ref Range: 30.0 -  100.0 ng/mL 35.6    ASSESSMENT AND PLAN: Vitamin D deficiency  Class 2 severe obesity with serious comorbidity and body mass index (BMI) of 36.0 to 36.9 in adult, unspecified obesity type ([place])  PLAN:  Vitamin D Deficiency Nitisha was informed that low vitamin D levels contributes to fatigue and are associated with obesity, breast, and colon cancer. Kristiane agrees to continue taking OTC Vit D and will follow up for routine testing of vitamin D, at least 2-3 times per year. She was informed of the risk of over-replacement of vitamin D and agrees to not increase her dose unless she discusses this with [place] first. Janaya agrees to follow up with our clinic in 2 to 3 weeks.  I spent > than 50% of the 15 minute visit on counseling as documented in the note.  Obesity Gertude is currently in the action stage of change. As such, her goal is to continue with weight loss efforts She has agreed to follow the Category 2 plan Tenisha has been instructed to work up to a goal of 150 minutes of combined cardio and strengthening exercise per week for weight loss and overall health benefits. We discussed the following Behavioral Modification Strategies today: increasing lean protein intake, decreasing simple carbohydrates  and work on meal planning and easy cooking plans   Aloni has agreed to follow up with our clinic in 2 to 3 weeks. She was informed of the importance of frequent follow up visits to maximize her success with intensive lifestyle modifications for her multiple health conditions.   OBESITY BEHAVIORAL INTERVENTION VISIT  Today's visit was # 11   Starting  weight: 244 lbs Starting date: 03/05/18 Today's weight : 242 lbs Today's date: 10/02/2018 Total lbs lost to date: 2    ASK: We discussed the diagnosis of obesity with [name] today and Tira agreed to give [place] permission to discuss obesity behavioral modification therapy today.  ASSESS: Tamisha has the diagnosis of obesity and  her BMI today is 36.8 Sylena is in the action stage of change   ADVISE: Vinetta was educated on the multiple health risks of obesity as well as the benefit of weight loss to improve her health. She was advised of the need for long term treatment and the importance of lifestyle modifications.  AGREE: Multiple dietary modification options and treatment options were discussed and  Noheli agreed to the above obesity treatment plan.  Wilhemena Durie, am acting as transcriptionist for Abby Potash, PA-C I, Abby Potash, PA-C have reviewed above note and agree with its content

## 2018-10-21 ENCOUNTER — Ambulatory Visit (INDEPENDENT_AMBULATORY_CARE_PROVIDER_SITE_OTHER): Payer: No Typology Code available for payment source | Admitting: Physician Assistant

## 2018-10-21 ENCOUNTER — Encounter: Payer: Self-pay | Admitting: Pulmonary Disease

## 2018-10-21 ENCOUNTER — Ambulatory Visit: Payer: No Typology Code available for payment source | Admitting: Pulmonary Disease

## 2018-10-21 ENCOUNTER — Telehealth: Payer: Self-pay | Admitting: Pulmonary Disease

## 2018-10-21 VITALS — BP 122/74 | HR 63 | Ht 68.0 in | Wt 243.0 lb

## 2018-10-21 VITALS — BP 126/73 | HR 61 | Ht 68.0 in | Wt 238.0 lb

## 2018-10-21 DIAGNOSIS — J310 Chronic rhinitis: Secondary | ICD-10-CM | POA: Diagnosis not present

## 2018-10-21 DIAGNOSIS — R7303 Prediabetes: Secondary | ICD-10-CM | POA: Diagnosis not present

## 2018-10-21 DIAGNOSIS — G4733 Obstructive sleep apnea (adult) (pediatric): Secondary | ICD-10-CM

## 2018-10-21 DIAGNOSIS — Z9989 Dependence on other enabling machines and devices: Secondary | ICD-10-CM

## 2018-10-21 DIAGNOSIS — Z6836 Body mass index (BMI) 36.0-36.9, adult: Secondary | ICD-10-CM | POA: Diagnosis not present

## 2018-10-21 NOTE — Patient Instructions (Signed)
Will change your auto CPAP to 5 - 15 cm H2O Will arrange for CPAP mask refitting You can try using flonase 1 spray in each nostril as needed for sinus congestion  Follow up in 1 year

## 2018-10-21 NOTE — Telephone Encounter (Signed)
Order placed today by [facility].  [facility] please advise on another dme company that is in network with [employer].  Thanks.

## 2018-10-21 NOTE — Progress Notes (Signed)
[facility]  Chief Complaint  Patient presents with  . Follow-up    wearing cpap avg 7hr nightly- feels pressure is too strong & mask is leaking. CWC:[id number]    Constitutional:  BP 122/74 (BP Location: Left Arm, Cuff Size: Normal)   Pulse 63   Ht 5\' 8"  (1.727 m)   Wt 243 lb (110.2 kg)   SpO2 96%   BMI 36.95 kg/m   Past Medical History:  A fib, Depression, GERD, HLD, HTN, OA, Prediabetes, SVT, Vitamin D deficiency, s/p UPPP  Brief Summary:  Misty Mahoney is a 52 y.o. female with obstructive sleep apnea.  She was previously seen by Dr. Gwenette Greet and Dr. Corrie Dandy.  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  Her sleep study from 2013 showed severe sleep apnea.  She got her current machine about 3 or 4 years ago. She is having trouble with mask leaking and pressure getting too high.  She then has to clamp her straps down, and this leaves marks on her face.  She also gets sinus congestion and dry mouth.  She runs through humidifier chamber nightly.     Physical Exam:   Appearance - well kempt   ENMT - clear nasal mucosa, midline nasal  septum, no oral exudates, no LAN, trachea midline, MP 3, narrow nasal angles  Respiratory - normal chest wall, normal respiratory effort, no accessory muscle use, no wheeze/rales  CV - s1s2 regular rate and rhythm, no murmurs, no peripheral edema, radial pulses symmetric  GI - soft, non tender, no masses  Lymph - no adenopathy noted in neck and axillary areas  MSK - normal gait  Ext - no cyanosis, clubbing, or joint inflammation noted  Skin - no rashes, lesions, or ulcers  Neuro - normal strength, oriented x 3  Psych - normal mood and affect   Assessment/Plan:   Obstructive sleep apnea. - she is compliant with CPAP and reports benefit from therapy - will change her auto CPAP to 5 - 15 cm H2O - will get her mask refit to help minimize air leak  CPAP rhinitis. - she can try using flonase  prn   Patient Instructions  Will change your auto CPAP to 5 - 15 cm H2O Will arrange for CPAP mask refitting You can try using flonase 1 spray in each nostril as needed for sinus congestion  Follow up in 1 year   Chesley Mires, MD [place] Pager: [phone] 10/21/2018, 11:56 AM  Flow Sheet    Sleep tests:  PSG 08/02/12 >> AHI 45 Auto CPAP 09/21/18 to 10/20/18 >> used on 30 of 30 nights with average 6 hrs 57 min.  Average AHI 3.1 with median CPAP 13 and 95 th percentile CPAP 18 cm H2O.  Cardiac tests:  Echo 07/10/17 >> EF 60 to 65%, grade 2 DD  Medications:   Allergies as of 10/21/2018      Reactions   Sunflower Oil Swelling   Seeds       Medication List        Accurate as of 10/21/18 11:56 AM. Always use your most recent med list.          aspirin EC 81 MG tablet Take 1 tablet (81 mg total) by mouth daily.   diltiazem 180 MG 24 hr capsule Commonly known as:  CARDIZEM CD Take 1 capsule (180 mg total) by mouth 2 (two) times daily.   ergocalciferol 1.25 MG (50000 UT) capsule Commonly known as:  VITAMIN D2 Take 1 capsule (50,000 Units total) by mouth once a week.   escitalopram 10 MG tablet Commonly known as:  LEXAPRO Take 1 tablet (10 mg total) by mouth daily.   FISH OIL PO Take 1 capsule by mouth daily.   flecainide 150 MG tablet Commonly known as:  TAMBOCOR Take 2 tablets (300 mg total) by mouth as needed.   irbesartan 300 MG tablet Commonly known as:  AVAPRO Take 1 tablet (300 mg total) by mouth daily.   Magnesium 250 MG Tabs Take 1 tablet by mouth daily.   metFORMIN 500 MG tablet Commonly known as:  GLUCOPHAGE Take 1 tablet (500 mg total) by mouth 2 (two) times daily with a meal.   triamterene-hydrochlorothiazide 37.5-25 MG tablet Commonly known as:  MAXZIDE-25 Take 1 tablet by mouth daily.   Vitamin D 50 MCG (2000 UT) Caps Take 1 capsule by mouth daily.       Past Surgical History:  She  has a past surgical  history that includes Cardiac electrophysiology study and ablation (2013); Tonsillectomy (52 yrs old); Wisdom tooth extraction (52 yrs old); Adenoidectomy; Palate / uvula biopsy / excision; v-tach ablation (N/A, 09/26/2012); Endovenous ablation saphenous vein w/ laser (Bilateral); and Knee arthroscopy with meniscal repair (Left, 08/25/2016).  Family History:  Her family history includes ADD / ADHD in her daughter and son; Alzheimer's disease in her father; Cancer in her maternal grandfather and paternal grandmother; Cancer (age of onset: 50) in her sister; Cataracts in her father; Colon cancer in her maternal grandmother; Colon polyps in her father; Dementia in her father and mother; Heart attack in her maternal grandfather; Hypertension in her brother, father, and mother; Stroke in her mother.  Social History:  She  reports that she has never smoked. She has never used smokeless tobacco. She reports that she drinks alcohol. She reports that she does not use drugs.

## 2018-10-22 NOTE — Telephone Encounter (Signed)
Done.  Sent to [facility].

## 2018-10-23 NOTE — Progress Notes (Signed)
Office: [phone]  /  Fax: [phone]   HPI:   Chief Complaint: OBESITY Misty Mahoney is here to discuss her progress with her obesity treatment plan. She is on the Category 2 plan and is following her eating plan approximately 50 % of the time. She states she is walking and doing yoga for 60 minutes 4 times per week. Misty Mahoney did well with weight loss. She reports being discouraged and getting bored with the plan. She states that her motivation is decreasing.  Her weight is 238 lb (108 kg) today and has had a weight loss of 4 pounds over a period of 2 to 3 weeks since her last visit. She has lost 6 lbs since starting treatment with [place].  Pre-Diabetes Misty Mahoney has a diagnosis of pre-diabetes based on her elevated Hgb A1c and was informed this puts her at greater risk of developing diabetes. She is on metformin and she denies nausea, vomiting, or diarrhea. She continues to work on diet and exercise to decrease risk of diabetes. She denies polyphagia or hypoglycemia.  ALLERGIES: Allergies  Allergen Reactions  . Sunflower Oil Swelling    Seeds     MEDICATIONS: Current Outpatient Medications on File Prior to Visit  Medication Sig Dispense Refill  . aspirin EC 81 MG tablet Take 1 tablet (81 mg total) by mouth daily. 30 tablet 0  . Cholecalciferol (VITAMIN D) 2000 units CAPS Take 1 capsule by mouth daily.    Marland Kitchen diltiazem (CARDIZEM CD) 180 MG 24 hr capsule Take 1 capsule (180 mg total) by mouth 2 (two) times daily. 180 capsule 3  . ergocalciferol (VITAMIN D2) 50000 units capsule Take 1 capsule (50,000 Units total) by mouth once a week. 12 capsule 1  . escitalopram (LEXAPRO) 10 MG tablet Take 1 tablet (10 mg total) by mouth daily. 90 tablet 1  . flecainide (TAMBOCOR) 150 MG tablet Take 2 tablets (300 mg total) by mouth as needed. 18 tablet 1  . irbesartan (AVAPRO) 300 MG tablet Take 1 tablet (300 mg total) by mouth daily. 90 tablet 1  . Magnesium 250 MG TABS Take 1 tablet by mouth daily.    .  metFORMIN (GLUCOPHAGE) 500 MG tablet Take 1 tablet (500 mg total) by mouth 2 (two) times daily with a meal. 180 tablet 1  . Omega-3 Fatty Acids (FISH OIL PO) Take 1 capsule by mouth daily.    Marland Kitchen triamterene-hydrochlorothiazide (MAXZIDE-25) 37.5-25 MG tablet Take 1 tablet by mouth daily. 90 tablet 1   No current facility-administered medications on file prior to visit.     PAST MEDICAL HISTORY: Past Medical History:  Diagnosis Date  . Anemia    gestational  . Atrial fibrillation ([place])   . Chicken pox as a child  . Depression 06/15/2015  . DM, gestational, diet controlled 04/10/2014   insulin resistant- A1C5.7   . Fatigue   . GERD (gastroesophageal reflux disease)    undx'd GERD   . H/O gestational diabetes mellitus, not currently pregnant 04/10/2014  . Heart murmur   . History of chicken pox   . Hyperlipidemia 12/20/2015  . Hypertension   . Joint pain   . Obesity, unspecified 04/10/2014  . OSA (obstructive sleep apnea)   . Osteoarthritis   . Palpitations   . Prediabetes   . Preventative health care 12/26/2015  . Sleep apnea    wears cpap   . SVT (supraventricular tachycardia) ([place])    s/p RFCA 09/26/12  . Vitamin D deficiency 12/26/2015    PAST SURGICAL  HISTORY: Past Surgical History:  Procedure Laterality Date  . ADENOIDECTOMY    . CARDIAC ELECTROPHYSIOLOGY STUDY AND ABLATION  2013  . ENDOVENOUS ABLATION SAPHENOUS VEIN W/ LASER Bilateral    Thermal ablation  . KNEE ARTHROSCOPY WITH MENISCAL REPAIR Left 08/25/2016   Procedure: KNEE ARTHROSCOPY WITH partial medial meniscectomy, chondroplasty;  Surgeon: Melrose Nakayama, MD;  Location: [facility];  Service: Orthopedics;  Laterality: Left;  KNEE ARTHROSCOPY WITH partial medial meniscectomy, chondroplasty  . PALATE / UVULA BIOPSY / EXCISION    . TONSILLECTOMY  52 yrs old  . V-TACH ABLATION N/A 09/26/2012   Procedure: V-TACH ABLATION;  Surgeon: Evans Lance, MD;  Location: [facility] CATH LAB;  Service: Cardiovascular;   Laterality: N/A;  . WISDOM TOOTH EXTRACTION  52 yrs old    SOCIAL HISTORY: Social History   Tobacco Use  . Smoking status: Never Smoker  . Smokeless tobacco: Never Used  Substance Use Topics  . Alcohol use: Yes    Comment: two or three times a year  . Drug use: No    FAMILY HISTORY: Family History  Problem Relation Age of Onset  . Hypertension Mother   . Dementia Mother   . Stroke Mother   . Dementia Father   . Hypertension Father   . Cataracts Father        bilateral  . Alzheimer's disease Father   . Colon polyps Father   . Cancer Sister 18       metastatic  . Hypertension Brother   . ADD / ADHD Daughter   . ADD / ADHD Son   . Heart attack Maternal Grandfather        pacemaker  . Cancer Maternal Grandfather        ?  Marland Kitchen Cancer Paternal Grandmother        colon  . Colon cancer Maternal Grandmother   . Esophageal cancer Neg Hx   . Rectal cancer Neg Hx   . Stomach cancer Neg Hx     ROS: Review of Systems  Constitutional: Positive for weight loss.  Gastrointestinal: Negative for diarrhea, nausea and vomiting.  Endo/Heme/Allergies:       Negative hypoglycemia Negative polyphagia    PHYSICAL EXAM: Blood pressure 126/73, pulse 61, height 5\' 8"  (1.727 m), weight 238 lb (108 kg), SpO2 98 %. Body mass index is 36.19 kg/m. Physical Exam  Constitutional: She is oriented to person, place, and time. She appears well-developed and well-nourished.  Cardiovascular: Normal rate.  Pulmonary/Chest: Effort normal.  Musculoskeletal: Normal range of motion.  Neurological: She is oriented to person, place, and time.  Skin: Skin is warm and dry.  Psychiatric: She has a normal mood and affect. Her behavior is normal.  Vitals reviewed.   RECENT LABS AND TESTS: BMET    Component Value Date/Time   NA 145 (H) 08/22/2018 1015   K 4.2 08/22/2018 1015   CL 105 08/22/2018 1015   CO2 22 08/22/2018 1015   GLUCOSE 104 (H) 08/22/2018 1015   GLUCOSE 110 (H) 02/05/2018 0951    BUN 26 (H) 08/22/2018 1015   CREATININE 0.80 08/22/2018 1015   CREATININE 0.59 06/04/2014 1029   CALCIUM 9.5 08/22/2018 1015   GFRNONAA 84 08/22/2018 1015   GFRAA 97 08/22/2018 1015   Lab Results  Component Value Date   HGBA1C 5.9 (H) 08/22/2018   HGBA1C 5.9 (H) 03/05/2018   HGBA1C 6.2 12/13/2017   HGBA1C 5.6 11/07/2016   HGBA1C 5.7 12/20/2015   Lab Results  Component  Value Date   INSULIN 12.4 08/22/2018   INSULIN 20.3 03/05/2018   CBC    Component Value Date/Time   WBC 8.4 03/05/2018 0901   WBC 6.3 12/13/2017 1029   RBC 3.92 03/05/2018 0901   RBC 4.04 12/13/2017 1029   HGB 11.3 03/05/2018 0901   HCT 34.2 03/05/2018 0917   PLT 331 03/05/2018 0901   MCV 89 03/05/2018 0901   MCH 28.8 03/05/2018 0901   MCH 28.6 02/07/2016 1022   MCHC 32.3 03/05/2018 0901   MCHC 32.7 12/13/2017 1029   RDW 13.8 03/05/2018 0901   LYMPHSABS 1.9 03/05/2018 0901   MONOABS 0.3 02/07/2016 1022   EOSABS 0.2 03/05/2018 0901   BASOSABS 0.0 03/05/2018 0901   Iron/TIBC/Ferritin/ %Sat    Component Value Date/Time   IRON 50 03/05/2018 0917   TIBC 316 03/05/2018 0917   FERRITIN 47 03/05/2018 0917   IRONPCTSAT 16 03/05/2018 0917   Lipid Panel     Component Value Date/Time   CHOL 177 08/22/2018 1015   TRIG 109 08/22/2018 1015   HDL 55 08/22/2018 1015   CHOLHDL 3.2 08/22/2018 1015   CHOLHDL 3 12/13/2017 1029   VLDL 13.4 12/13/2017 1029   LDLCALC 100 (H) 08/22/2018 1015   Hepatic Function Panel     Component Value Date/Time   PROT 6.9 08/22/2018 1015   ALBUMIN 4.0 08/22/2018 1015   AST 13 08/22/2018 1015   ALT 12 08/22/2018 1015   ALKPHOS 49 08/22/2018 1015   BILITOT <0.2 08/22/2018 1015   BILIDIR 0.1 06/04/2014 1029   IBILI 0.3 06/04/2014 1029      Component Value Date/Time   TSH 1.900 03/05/2018 0901   TSH 1.19 12/13/2017 1029   TSH 1.00 11/07/2016 0929    ASSESSMENT AND PLAN: Prediabetes  Class 2 severe obesity with serious comorbidity and body mass index (BMI) of 36.0 to  36.9 in adult, unspecified obesity type ([place])  PLAN:  Pre-Diabetes Misty Mahoney will continue to work on weight loss, diet, exercise, and decreasing simple carbohydrates in her diet to help decrease the risk of diabetes. We dicussed metformin including benefits and risks. She was informed that eating too many simple carbohydrates or too many calories at one sitting increases the likelihood of GI side effects. Misty Mahoney agrees to continue taking metformin, and she agrees to follow up with our clinic in 2 to 3 weeks as directed to monitor her progress.  I spent > than 50% of the 15 minute visit on counseling as documented in the note.  Obesity Misty Mahoney is currently in the action stage of change. As such, her goal is to continue with weight loss efforts She has agreed to change to keep a food journal with 1200 calories and 80 grams of protein daily Misty Mahoney has been instructed to work up to a goal of 150 minutes of combined cardio and strengthening exercise per week for weight loss and overall health benefits. We discussed the following Behavioral Modification Strategies today: work on meal planning and easy cooking plans   Misty Mahoney has agreed to follow up with our clinic in 2 to 3 weeks. She was informed of the importance of frequent follow up visits to maximize her success with intensive lifestyle modifications for her multiple health conditions.   OBESITY BEHAVIORAL INTERVENTION VISIT  Today's visit was # 12   Starting weight: 244 lbs Starting date: 03/05/18 Today's weight : 238 lbs Today's date: 10/21/2018 Total lbs lost to date: 6    ASK: We discussed the diagnosis of obesity  with Misty Mahoney today and Misty Mahoney agreed to give [place] permission to discuss obesity behavioral modification therapy today.  ASSESS: Misty Mahoney has the diagnosis of obesity and her BMI today is 36.2 Misty Mahoney is in the action stage of change   ADVISE: Misty Mahoney was educated on the multiple health risks of obesity as well as the  benefit of weight loss to improve her health. She was advised of the need for long term treatment and the importance of lifestyle modifications.  AGREE: Multiple dietary modification options and treatment options were discussed and  Misty Mahoney agreed to the above obesity treatment plan.  Wilhemena Durie, am acting as transcriptionist for Abby Potash, PA-C I, Abby Potash, PA-C have reviewed above note and agree with its content

## 2018-10-24 ENCOUNTER — Ambulatory Visit: Payer: No Typology Code available for payment source | Admitting: Family Medicine

## 2018-11-04 MED FILL — ESCITALOPRAM 10 MG TABLET: 10 | 90 days supply | Qty: 90 | Fill #1

## 2018-11-04 MED FILL — CARTIA XT 180 MG CAPSULE SA: 180 | 90 days supply | Qty: 180 | Fill #1

## 2018-11-04 MED FILL — metFORMIN HCL 500 MG TABS: 500 | 90 days supply | Qty: 180 | Fill #1

## 2018-11-25 ENCOUNTER — Encounter: Payer: Self-pay | Admitting: Family Medicine

## 2018-11-26 NOTE — Progress Notes (Addendum)
[facility] [address], [place] [address] Phone: [phone] Subjective:   Fontaine No, am serving as a scribe for Dr. Hulan Saas.  I'm s   CC: Hand and wrist pain  OIZ:[id number]  Misty Mahoney is a 53 y.o. female coming in with complaint of right wrist pain and right foot pain. History of stress fracture in right foot. Suffered inversion sprain this past summer. Has pain over top of foot when she is barefoot. Is on her feet a lot as she works Engineer, civil (consulting) as a Marine scientist.   Patient is having right sided wrist and hand numbness. Worse when laying in the bed. Hand goes numb with washing hair, using the mouse, and using her phone during the day. Has been doing ice and bracing for 3 months now which has helped somewhat but her pain is not going away. Tip of 4th finger is numb. Wakes up with the entire hand numb.      Past Medical History:  Diagnosis Date  . Anemia    gestational  . Atrial fibrillation ([place])   . Chicken pox as a child  . Depression 06/15/2015  . DM, gestational, diet controlled 04/10/2014   insulin resistant- A1C5.7   . Fatigue   . GERD (gastroesophageal reflux disease)    undx'd GERD   . H/O gestational diabetes mellitus, not currently pregnant 04/10/2014  . Heart murmur   . History of chicken pox   . Hyperlipidemia 12/20/2015  . Hypertension   . Joint pain   . Obesity, unspecified 04/10/2014  . OSA (obstructive sleep apnea)   . Osteoarthritis   . Palpitations   . Prediabetes   . Preventative health care 12/26/2015  . Sleep apnea    wears cpap   . SVT (supraventricular tachycardia) ([place])    s/p RFCA 09/26/12  . Vitamin D deficiency 12/26/2015   Past Surgical History:  Procedure Laterality Date  . ADENOIDECTOMY    . CARDIAC ELECTROPHYSIOLOGY STUDY AND ABLATION  2013  . ENDOVENOUS ABLATION SAPHENOUS VEIN W/ LASER Bilateral    Thermal ablation  . KNEE ARTHROSCOPY WITH MENISCAL REPAIR Left 08/25/2016   Procedure:  KNEE ARTHROSCOPY WITH partial medial meniscectomy, chondroplasty;  Surgeon: Melrose Nakayama, MD;  Location: [facility];  Service: Orthopedics;  Laterality: Left;  KNEE ARTHROSCOPY WITH partial medial meniscectomy, chondroplasty  . PALATE / UVULA BIOPSY / EXCISION    . TONSILLECTOMY  53 yrs old  . V-TACH ABLATION N/A 09/26/2012   Procedure: V-TACH ABLATION;  Surgeon: Evans Lance, MD;  [facility] CATH LAB;  Service: Cardiovascular;  Laterality: N/A;  . WISDOM TOOTH EXTRACTION  53 yrs old   Social History   Socioeconomic History  . Marital status: Significant Other    Spouse name: Misty Mahoney  . Number of children: 2  . Years of education: Not on file  . Highest education level: Not on file  Occupational History  . Occupation: Therapist, sports  Social Needs  . Financial resource strain: Not on file  . Food insecurity:    Worry: Not on file    Inability: Not on file  . Transportation needs:    Medical: Not on file    Non-medical: Not on file  Tobacco Use  . Smoking status: Never Smoker  . Smokeless tobacco: Never Used  Substance and Sexual Activity  . Alcohol use: Yes    Comment: two or three times a year  . Drug use: No  . Sexual  activity: Yes    Partners: Male    Comment: lives with kids, significant other and step daughter. minimizing dairy and gluten, exercise  Lifestyle  . Physical activity:    Days per week: Not on file    Minutes per session: Not on file  . Stress: Not on file  Relationships  . Social connections:    Talks on phone: Not on file    Gets together: Not on file    Attends religious service: Not on file    Active member of club or organization: Not on file    Attends meetings of clubs or organizations: Not on file    Relationship status: Not on file  Other Topics Concern  . Not on file  Social History Narrative  . Not on file   Allergies  Allergen Reactions  . Sunflower Oil Swelling    Seeds    Family History  Problem Relation Age of Onset    . Hypertension Mother   . Dementia Mother   . Stroke Mother   . Dementia Father   . Hypertension Father   . Cataracts Father        bilateral  . Alzheimer's disease Father   . Colon polyps Father   . Cancer Sister 22       metastatic  . Hypertension Brother   . ADD / ADHD Daughter   . ADD / ADHD Son   . Heart attack Maternal Grandfather        pacemaker  . Cancer Maternal Grandfather        ?  Marland Kitchen Cancer Paternal Grandmother        colon  . Colon cancer Maternal Grandmother   . Esophageal cancer Neg Hx   . Rectal cancer Neg Hx   . Stomach cancer Neg Hx     Current Outpatient Medications (Endocrine & Metabolic):  .  metFORMIN (GLUCOPHAGE) 500 MG tablet, Take 1 tablet (500 mg total) by mouth 2 (two) times daily with a meal.  Current Outpatient Medications (Cardiovascular):  .  diltiazem (CARDIZEM CD) 180 MG 24 hr capsule, Take 1 capsule (180 mg total) by mouth 2 (two) times daily. .  flecainide (TAMBOCOR) 150 MG tablet, Take 2 tablets (300 mg total) by mouth as needed. .  irbesartan (AVAPRO) 300 MG tablet, Take 1 tablet (300 mg total) by mouth daily. Marland Kitchen  triamterene-hydrochlorothiazide (MAXZIDE-25) 37.5-25 MG tablet, Take 1 tablet by mouth daily.   Current Outpatient Medications (Analgesics):  .  aspirin EC 81 MG tablet, Take 1 tablet (81 mg total) by mouth daily.   Current Outpatient Medications (Other):  Marland Kitchen  Cholecalciferol (VITAMIN D) 2000 units CAPS, Take 1 capsule by mouth daily. .  ergocalciferol (VITAMIN D2) 50000 units capsule, Take 1 capsule (50,000 Units total) by mouth once a week. .  escitalopram (LEXAPRO) 10 MG tablet, Take 1 tablet (10 mg total) by mouth daily. .  Magnesium 250 MG TABS, Take 1 tablet by mouth daily. .  Omega-3 Fatty Acids (FISH OIL PO), Take 1 capsule by mouth daily.    Past medical history, social, surgical and family history all reviewed in electronic medical record.  No pertanent information unless stated regarding to the chief complaint.    Review of Systems:  No headache, visual changes, nausea, vomiting, diarrhea, constipation, dizziness, abdominal pain, skin rash, fevers, chills, night sweats, weight loss, swollen lymph nodes, body aches, joint swelling,  chest pain, shortness of breath, mood changes.  Positive muscle aches  Objective  Blood  pressure (!) 138/92, pulse 66, height 5\' 8"  (1.727 m), weight 250 lb (113.4 kg), SpO2 96 %.    General: No apparent distress alert and oriented x3 mood and affect normal, dressed appropriately.  HEENT: Pupils equal, extraocular movements intact  Respiratory: Patient's speak in full sentences and does not appear short of breath  Cardiovascular: No lower extremity edema, non tender, no erythema  Skin: Warm dry intact with no signs of infection or rash on extremities or on axial skeleton.  Abdomen: Soft nontender  Neuro: Cranial nerves II through XII are intact, neurovascularly intact in all extremities with 2+ DTRs and 2+ pulses.  Lymph: No lymphadenopathy of posterior or anterior cervical chain or axillae bilaterally.  Gait normal with good balance and coordination.  MSK:  Non tender with full range of motion and good stability and symmetric strength and tone of shoulders, elbows, wrist, hip, knee and ankles bilaterally.  Wrist: Right Inspection normal with no visible erythema or swelling. ROM smooth and normal with good flexion and extension and ulnar/radial deviation that is symmetrical with opposite wrist. Palpation is normal over metacarpals, navicular, lunate, and TFCC; tendons without tenderness/ swelling No snuffbox tenderness. No tenderness over Canal of [place]. Strength 5/5 in all directions without pain. Negative Finkelstein, positive Tinel's and phalens. Negative Watson's test. Contralateral wrist unremarkable Right foot exam shows the patient has some mild deformity of the base of the fifth metatarsal that appears to be chronic.  Minimal tenderness on palpation.  No  erythema no swelling.  MSK US performed of: right  This study was ordered, performed, and interpreted by Charlann Boxer D.O.  Wrist: Enlargement of the nerve  noted.  Patient does have hypoechoic changes in the area as well.  Consistent with carpal tunnel syndrome  IMPRESSION: Carpal Tunnel syndrome   Impression and Recommendations:     This case required medical decision making of moderate complexity. The above documentation has been reviewed and is accurate and complete Lyndal Pulley, DO       Note: This dictation was prepared with Dragon dictation along with smaller phrase technology. Any transcriptional errors that result from this process are unintentional.

## 2018-11-26 NOTE — Telephone Encounter (Signed)
FYI

## 2018-11-27 ENCOUNTER — Encounter: Payer: Self-pay | Admitting: Family Medicine

## 2018-11-27 ENCOUNTER — Ambulatory Visit: Payer: Self-pay

## 2018-11-27 ENCOUNTER — Ambulatory Visit: Payer: No Typology Code available for payment source | Admitting: Family Medicine

## 2018-11-27 ENCOUNTER — Ambulatory Visit (INDEPENDENT_AMBULATORY_CARE_PROVIDER_SITE_OTHER): Payer: No Typology Code available for payment source | Admitting: Physician Assistant

## 2018-11-27 VITALS — BP 138/92 | HR 66 | Ht 68.0 in | Wt 250.0 lb

## 2018-11-27 DIAGNOSIS — M25531 Pain in right wrist: Secondary | ICD-10-CM

## 2018-11-27 DIAGNOSIS — G5601 Carpal tunnel syndrome, right upper limb: Secondary | ICD-10-CM

## 2018-11-27 NOTE — Assessment & Plan Note (Addendum)
Discussed icing regimen and home exercise.  Discussed which activities to do which wants to avoid.  Discussed bracing 23 hours daily for the next 2 weeks then nightly for another 2 weeks.  Hopefully patient responds well to conservative therapy.  Follow-up again in 4 to 6 weeks worsening symptoms patient will consider injection.

## 2018-11-27 NOTE — Patient Instructions (Signed)
Good to see you  Wear wrist brace day and night for 2 weeks then nightly for at least another 2 weeks B12 1015mcg daily with 200mg  of B6  pennsaid pinkie amount topically 2 times daily as needed.  Avoid repetitive wrist flexion if you can  Ice 20 minutes 2 times daily. Usually after activity and before bed. For the foot avoid being barefoot We will clal you when we get the orthotic.  Vitamin D 2000 IU daily  Bronwen Betters, Galen Manila, allegria gravity defyer See me again in 4 weeks otherwise  Happy New Year!

## 2018-12-05 ENCOUNTER — Ambulatory Visit (INDEPENDENT_AMBULATORY_CARE_PROVIDER_SITE_OTHER): Payer: No Typology Code available for payment source | Admitting: Physician Assistant

## 2018-12-05 ENCOUNTER — Ambulatory Visit (INDEPENDENT_AMBULATORY_CARE_PROVIDER_SITE_OTHER): Payer: No Typology Code available for payment source | Admitting: Family Medicine

## 2018-12-05 ENCOUNTER — Encounter (INDEPENDENT_AMBULATORY_CARE_PROVIDER_SITE_OTHER): Payer: Self-pay | Admitting: Physician Assistant

## 2018-12-05 ENCOUNTER — Ambulatory Visit: Payer: No Typology Code available for payment source | Admitting: Family Medicine

## 2018-12-05 VITALS — BP 144/79 | HR 70 | Temp 98.4°F | Ht 68.0 in | Wt 245.0 lb

## 2018-12-05 DIAGNOSIS — E559 Vitamin D deficiency, unspecified: Secondary | ICD-10-CM

## 2018-12-05 DIAGNOSIS — R2689 Other abnormalities of gait and mobility: Secondary | ICD-10-CM

## 2018-12-05 DIAGNOSIS — Z6837 Body mass index (BMI) 37.0-37.9, adult: Secondary | ICD-10-CM

## 2018-12-05 NOTE — Assessment & Plan Note (Signed)
Patient was fitted in custom orthotics today.  Discussed icing regimen and home exercise.  Discussed which activities to do which was avoided.  Patient will follow-up in 4 weeks

## 2018-12-05 NOTE — Progress Notes (Signed)
Patient was fitted for a : standard, cushioned, semi-rigid orthotic. The orthotic was heated and afterward the patient was in a seated position and the orthotic molded. The patient was positioned in subtalar neutral position and 10 degrees of ankle dorsiflexion in a non-weight bearing stance. After completion of molding, patient did have orthotic management which included instructions on acclimating to the orthotics, signs of ill fit as well as care for the orthotic.   The blank was ground to a stable position for weight bearing. Size: 10.5 Base: Carbon fiber Additional Posting and Padding: The following postings were fitted onto the molded orthotics to help maintain a talar neutral position - Wedge posting for transverse arch: left and right 993/71         Silicone posting for longitudinal arch: lateral left 250/100 x2 right 250/100 medial: 300/110 The patient ambulated these, and they were very comfortable and supportive.

## 2018-12-09 NOTE — Progress Notes (Signed)
Office: [phone]  /  Fax: [phone]   HPI:   Chief Complaint: OBESITY Misty Mahoney is here to discuss her progress with her obesity treatment plan. She is keeping a food journal with 1200 calories and 80 grams of protein and is following her eating plan approximately 0 % of the time. She states she is doing yoga and walking 75 and 45 minutes 2 times per week. Misty Mahoney struggled to follow the plan over the holidays. She reports that she had some family issues as well. She is ready to get back on track.  Her weight is 245 lb (111.1 kg) today and has had a weight gain of 7 pounds over a period of 6 weeks since her last visit. She has lost 0 lbs since starting treatment with [place].  Vitamin D deficiency Misty Mahoney has a diagnosis of vitamin D deficiency. She is currently taking OTC vit D 2,000 IU per day. She denies nausea, vomiting, or muscle weakness.  ASSESSMENT AND PLAN:  Vitamin D deficiency  Class 2 severe obesity with serious comorbidity and body mass index (BMI) of 37.0 to 37.9 in adult, unspecified obesity type ([place])  PLAN:  Vitamin D Deficiency Misty Mahoney was informed that low vitamin D levels contributes to fatigue and are associated with obesity, breast, and colon cancer. She agrees to continue to take OTC Vit D 2,000 IU every day and will follow up for routine testing of vitamin D, at least 2-3 times per year. She was informed of the risk of over-replacement of vitamin D and agrees to not increase her dose unless she discusses this with [place] first. Misty Mahoney agrees to follow up as directed in 2 weeks.  I spent > than 50% of the 15 minute visit on counseling as documented in the note.  Obesity Misty Mahoney is currently in the action stage of change. As such, her goal is to continue with weight loss efforts. She has agreed to change to the Category 2 plan. Misty Mahoney has been instructed to work up to a goal of 150 minutes of combined cardio and strengthening exercise per week for weight loss and overall  health benefits. We discussed the following Behavioral Modification Strategies today: work on meal planning and easy cooking plans and planning for success.  Misty Mahoney has agreed to follow up with our clinic in 2 weeks. She was informed of the importance of frequent follow up visits to maximize her success with intensive lifestyle modifications for her multiple health conditions.  ALLERGIES: Allergies  Allergen Reactions  . Sunflower Oil Swelling    Seeds     MEDICATIONS: Current Outpatient Medications on File Prior to Visit  Medication Sig Dispense Refill  . aspirin EC 81 MG tablet Take 1 tablet (81 mg total) by mouth daily. 30 tablet 0  . Cholecalciferol (VITAMIN D) 2000 units CAPS Take 1 capsule by mouth daily.    Marland Kitchen diltiazem (CARDIZEM CD) 180 MG 24 hr capsule Take 1 capsule (180 mg total) by mouth 2 (two) times daily. 180 capsule 3  . ergocalciferol (VITAMIN D2) 50000 units capsule Take 1 capsule (50,000 Units total) by mouth once a week. 12 capsule 1  . escitalopram (LEXAPRO) 10 MG tablet Take 1 tablet (10 mg total) by mouth daily. 90 tablet 1  . flecainide (TAMBOCOR) 150 MG tablet Take 2 tablets (300 mg total) by mouth as needed. 18 tablet 1  . irbesartan (AVAPRO) 300 MG tablet Take 1 tablet (300 mg total) by mouth daily. 90 tablet 1  . Magnesium 250 MG  TABS Take 1 tablet by mouth daily.    . metFORMIN (GLUCOPHAGE) 500 MG tablet Take 1 tablet (500 mg total) by mouth 2 (two) times daily with a meal. 180 tablet 1  . Omega-3 Fatty Acids (FISH OIL PO) Take 1 capsule by mouth daily.    Marland Kitchen triamterene-hydrochlorothiazide (MAXZIDE-25) 37.5-25 MG tablet Take 1 tablet by mouth daily. 90 tablet 1   No current facility-administered medications on file prior to visit.     PAST MEDICAL HISTORY: Past Medical History:  Diagnosis Date  . Anemia    gestational  . Atrial fibrillation ([place])   . Chicken pox as a child  . Depression 06/15/2015  . DM, gestational, diet controlled 04/10/2014   insulin  resistant- A1C5.7   . Fatigue   . GERD (gastroesophageal reflux disease)    undx'd GERD   . H/O gestational diabetes mellitus, not currently pregnant 04/10/2014  . Heart murmur   . History of chicken pox   . Hyperlipidemia 12/20/2015  . Hypertension   . Joint pain   . Obesity, unspecified 04/10/2014  . OSA (obstructive sleep apnea)   . Osteoarthritis   . Palpitations   . Prediabetes   . Preventative health care 12/26/2015  . Sleep apnea    wears cpap   . SVT (supraventricular tachycardia) ([place])    s/p RFCA 09/26/12  . Vitamin D deficiency 12/26/2015    PAST SURGICAL HISTORY: Past Surgical History:  Procedure Laterality Date  . ADENOIDECTOMY    . CARDIAC ELECTROPHYSIOLOGY STUDY AND ABLATION  2013  . ENDOVENOUS ABLATION SAPHENOUS VEIN W/ LASER Bilateral    Thermal ablation  . KNEE ARTHROSCOPY WITH MENISCAL REPAIR Left 08/25/2016   Procedure: KNEE ARTHROSCOPY WITH partial medial meniscectomy, chondroplasty;  Surgeon: Melrose Nakayama, MD;  Location: [facility];  Service: Orthopedics;  Laterality: Left;  KNEE ARTHROSCOPY WITH partial medial meniscectomy, chondroplasty  . PALATE / UVULA BIOPSY / EXCISION    . TONSILLECTOMY  53 yrs old  . V-TACH ABLATION N/A 09/26/2012   Procedure: V-TACH ABLATION;  Surgeon: Evans Lance, MD;  [facility] C[facility] LAB;  Service: Cardiovascular;  Laterality: N/A;  . WISDOM TOOTH EXTRACTION  53 yrs old    SOCIAL HISTORY: Social History   Tobacco Use  . Smoking status: Never Smoker  . Smokeless tobacco: Never Used  Substance Use Topics  . Alcohol use: Yes    Comment: two or three times a year  . Drug use: No   FAMILY HISTORY: Family History  Problem Relation Age of Onset  . Hypertension Mother   . Dementia Mother   . Stroke Mother   . Dementia Father   . Hypertension Father   . Cataracts Father        bilateral  . Alzheimer's disease Father   . Colon polyps Father   . Cancer Sister 37       metastatic  . Hypertension Brother     . ADD / ADHD Daughter   . ADD / ADHD Son   . Heart attack Maternal Grandfather        pacemaker  . Cancer Maternal Grandfather        ?  Marland Kitchen Cancer Paternal Grandmother        colon  . Colon cancer Maternal Grandmother   . Esophageal cancer Neg Hx   . Rectal cancer Neg Hx   . Stomach cancer Neg Hx    ROS: Review of Systems  Constitutional: Negative for weight loss.  Gastrointestinal: Negative  for nausea and vomiting.  Musculoskeletal:       Negative for muscle weakness.   PHYSICAL EXAM: Blood pressure (!) 144/79, pulse 70, temperature 98.4 F (36.9 C), temperature source Oral, height 5\' 8"  (1.727 m), weight 245 lb (111.1 kg), SpO2 96 %. Body mass index is 37.25 kg/m. Physical Exam Vitals signs reviewed.  Constitutional:      Appearance: Normal appearance. She is obese.  Cardiovascular:     Rate and Rhythm: Normal rate.  Pulmonary:     Effort: Pulmonary effort is normal.  Musculoskeletal: Normal range of motion.  Skin:    General: Skin is warm and dry.  Neurological:     Mental Status: She is alert and oriented to person, place, and time.  Psychiatric:        Mood and Affect: Mood normal.        Behavior: Behavior normal.    RECENT LABS AND TESTS: BMET    Component Value Date/Time   NA 145 (H) 08/22/2018 1015   K 4.2 08/22/2018 1015   CL 105 08/22/2018 1015   CO2 22 08/22/2018 1015   GLUCOSE 104 (H) 08/22/2018 1015   GLUCOSE 110 (H) 02/05/2018 0951   BUN 26 (H) 08/22/2018 1015   CREATININE 0.80 08/22/2018 1015   CREATININE 0.59 06/04/2014 1029   CALCIUM 9.5 08/22/2018 1015   GFRNONAA 84 08/22/2018 1015   GFRAA 97 08/22/2018 1015   Lab Results  Component Value Date   HGBA1C 5.9 (H) 08/22/2018   HGBA1C 5.9 (H) 03/05/2018   HGBA1C 6.2 12/13/2017   HGBA1C 5.6 11/07/2016   HGBA1C 5.7 12/20/2015   Lab Results  Component Value Date   INSULIN 12.4 08/22/2018   INSULIN 20.3 03/05/2018   CBC    Component Value Date/Time   WBC 8.4 03/05/2018 0901   WBC  6.3 12/13/2017 1029   RBC 3.92 03/05/2018 0901   RBC 4.04 12/13/2017 1029   HGB 11.3 03/05/2018 0901   HCT 34.2 03/05/2018 0917   PLT 331 03/05/2018 0901   MCV 89 03/05/2018 0901   MCH 28.8 03/05/2018 0901   MCH 28.6 02/07/2016 1022   MCHC 32.3 03/05/2018 0901   MCHC 32.7 12/13/2017 1029   RDW 13.8 03/05/2018 0901   LYMPHSABS 1.9 03/05/2018 0901   MONOABS 0.3 02/07/2016 1022   EOSABS 0.2 03/05/2018 0901   BASOSABS 0.0 03/05/2018 0901   Iron/TIBC/Ferritin/ %Sat    Component Value Date/Time   IRON 50 03/05/2018 0917   TIBC 316 03/05/2018 0917   FERRITIN 47 03/05/2018 0917   IRONPCTSAT 16 03/05/2018 0917   Lipid Panel     Component Value Date/Time   CHOL 177 08/22/2018 1015   TRIG 109 08/22/2018 1015   HDL 55 08/22/2018 1015   CHOLHDL 3.2 08/22/2018 1015   CHOLHDL 3 12/13/2017 1029   VLDL 13.4 12/13/2017 1029   LDLCALC 100 (H) 08/22/2018 1015   Hepatic Function Panel     Component Value Date/Time   PROT 6.9 08/22/2018 1015   ALBUMIN 4.0 08/22/2018 1015   AST 13 08/22/2018 1015   ALT 12 08/22/2018 1015   ALKPHOS 49 08/22/2018 1015   BILITOT <0.2 08/22/2018 1015   BILIDIR 0.1 06/04/2014 1029   IBILI 0.3 06/04/2014 1029      Component Value Date/Time   TSH 1.900 03/05/2018 0901   TSH 1.19 12/13/2017 1029   TSH 1.00 11/07/2016 0929   Results for KENNIDEE, HEYNE (MRN [id number]) as of 12/09/2018 08:09  Ref. Range 08/22/2018 10:15  Vitamin D, 25-Hydroxy Latest Ref Range: 30.0 - 100.0 ng/mL 35.6     OBESITY BEHAVIORAL INTERVENTION VISIT  Today's visit was # 13   Starting weight: 244 lbs Starting date: 03/05/18 Today's weight : Weight: 245 lb (111.1 kg)  Today's date: 12/05/2018 Total lbs lost to date: 0  ASK: We discussed the diagnosis of obesity with Misty Mahoney today and Misty Mahoney agreed to give [place] permission to discuss obesity behavioral modification therapy today.  ASSESS: Misty Mahoney has the diagnosis of obesity and her BMI today is  37.2. Misty Mahoney is in the action stage of change.   ADVISE: Harlo was educated on the multiple health risks of obesity as well as the benefit of weight loss to improve her health. She was advised of the need for long term treatment and the importance of lifestyle modifications to improve her current health and to decrease her risk of future health problems.  AGREE: Multiple dietary modification options and treatment options were discussed and Jaimie agreed to follow the recommendations documented in the above note.  ARRANGE: Aryahi was educated on the importance of frequent visits to treat obesity as outlined per CMS and USPSTF guidelines and agreed to schedule her next follow up appointment today.  Lenward Chancellor, am acting as transcriptionist for Abby Potash, PA-C I, Abby Potash, PA-C have reviewed above note and agree with its content

## 2018-12-12 ENCOUNTER — Ambulatory Visit: Payer: No Typology Code available for payment source | Admitting: Family Medicine

## 2018-12-23 ENCOUNTER — Encounter (INDEPENDENT_AMBULATORY_CARE_PROVIDER_SITE_OTHER): Payer: Self-pay | Admitting: Physician Assistant

## 2018-12-23 ENCOUNTER — Ambulatory Visit (INDEPENDENT_AMBULATORY_CARE_PROVIDER_SITE_OTHER): Payer: No Typology Code available for payment source | Admitting: Physician Assistant

## 2018-12-23 VITALS — BP 118/74 | HR 62 | Temp 97.9°F | Ht 68.0 in | Wt 241.0 lb

## 2018-12-23 DIAGNOSIS — Z6836 Body mass index (BMI) 36.0-36.9, adult: Secondary | ICD-10-CM | POA: Diagnosis not present

## 2018-12-23 DIAGNOSIS — R7303 Prediabetes: Secondary | ICD-10-CM | POA: Diagnosis not present

## 2018-12-23 NOTE — Progress Notes (Signed)
Office: [phone]  /  Fax: [phone]   HPI:   Chief Complaint: OBESITY Misty Mahoney is here to discuss her progress with her obesity treatment plan. She is on the Category 2 plan and is following her eating plan approximately 80% of the time. She states she is walking and doing yoga for 45-75 minutes 2 times per week. Misty Mahoney did very well with weight loss. She reports that her cravings are controlled. She is not drinking enough water during the day.   Her weight is 241 lb (109.3 kg) Mahoney and has had a weight loss of 4 pounds over a period of 2 to 3 weeks since her last visit. She has lost 3 lbs since starting treatment with [place].  Pre-Diabetes Misty Mahoney has a diagnosis of pre-diabetes based on her elevated Hgb A1c and was informed this puts her at greater risk of developing diabetes. She is on metformin and denies nausea, vomiting, diarrhea, or polyphagia. She continues to work on diet and exercise to decrease risk of diabetes. She denies hypoglycemia.  ALLERGIES: Allergies  Allergen Reactions  . Sunflower Oil Swelling    Seeds     MEDICATIONS: Current Outpatient Medications on File Prior to Visit  Medication Sig Dispense Refill  . aspirin EC 81 MG tablet Take 1 tablet (81 mg total) by mouth daily. 30 tablet 0  . Cholecalciferol (VITAMIN D) 2000 units CAPS Take 1 capsule by mouth daily.    Marland Kitchen diltiazem (CARDIZEM CD) 180 MG 24 hr capsule Take 1 capsule (180 mg total) by mouth 2 (two) times daily. 180 capsule 3  . ergocalciferol (VITAMIN D2) 50000 units capsule Take 1 capsule (50,000 Units total) by mouth once a week. 12 capsule 1  . escitalopram (LEXAPRO) 10 MG tablet Take 1 tablet (10 mg total) by mouth daily. 90 tablet 1  . flecainide (TAMBOCOR) 150 MG tablet Take 2 tablets (300 mg total) by mouth as needed. 18 tablet 1  . irbesartan (AVAPRO) 300 MG tablet Take 1 tablet (300 mg total) by mouth daily. 90 tablet 1  . Magnesium 250 MG TABS Take 1 tablet by mouth daily.    . metFORMIN  (GLUCOPHAGE) 500 MG tablet Take 1 tablet (500 mg total) by mouth 2 (two) times daily with a meal. 180 tablet 1  . Omega-3 Fatty Acids (FISH OIL PO) Take 1 capsule by mouth daily.    Marland Kitchen triamterene-hydrochlorothiazide (MAXZIDE-25) 37.5-25 MG tablet Take 1 tablet by mouth daily. 90 tablet 1   No current facility-administered medications on file prior to visit.     PAST MEDICAL HISTORY: Past Medical History:  Diagnosis Date  . Anemia    gestational  . Atrial fibrillation ([place])   . Chicken pox as a child  . Depression 06/15/2015  . DM, gestational, diet controlled 04/10/2014   insulin resistant- A1C5.7   . Fatigue   . GERD (gastroesophageal reflux disease)    undx'd GERD   . H/O gestational diabetes mellitus, not currently pregnant 04/10/2014  . Heart murmur   . History of chicken pox   . Hyperlipidemia 12/20/2015  . Hypertension   . Joint pain   . Obesity, unspecified 04/10/2014  . OSA (obstructive sleep apnea)   . Osteoarthritis   . Palpitations   . Prediabetes   . Preventative health care 12/26/2015  . Sleep apnea    wears cpap   . SVT (supraventricular tachycardia) ([place])    s/p RFCA 09/26/12  . Vitamin D deficiency 12/26/2015    PAST SURGICAL HISTORY: Past  Surgical History:  Procedure Laterality Date  . ADENOIDECTOMY    . CARDIAC ELECTROPHYSIOLOGY STUDY AND ABLATION  2013  . ENDOVENOUS ABLATION SAPHENOUS VEIN W/ LASER Bilateral    Thermal ablation  . KNEE ARTHROSCOPY WITH MENISCAL REPAIR Left 08/25/2016   Procedure: KNEE ARTHROSCOPY WITH partial medial meniscectomy, chondroplasty;  Surgeon: Melrose Nakayama, MD;  Location: [facility];  Service: Orthopedics;  Laterality: Left;  KNEE ARTHROSCOPY WITH partial medial meniscectomy, chondroplasty  . PALATE / UVULA BIOPSY / EXCISION    . TONSILLECTOMY  53 yrs old  . V-TACH ABLATION N/A 09/26/2012   Procedure: V-TACH ABLATION;  Surgeon: Evans Lance, MD;  Location: [facility] CATH LAB;  Service: Cardiovascular;  Laterality: N/A;    . WISDOM TOOTH EXTRACTION  53 yrs old    SOCIAL HISTORY: Social History   Tobacco Use  . Smoking status: Never Smoker  . Smokeless tobacco: Never Used  Substance Use Topics  . Alcohol use: Yes    Comment: two or three times a year  . Drug use: No    FAMILY HISTORY: Family History  Problem Relation Age of Onset  . Hypertension Mother   . Dementia Mother   . Stroke Mother   . Dementia Father   . Hypertension Father   . Cataracts Father        bilateral  . Alzheimer's disease Father   . Colon polyps Father   . Cancer Sister 23       metastatic  . Hypertension Brother   . ADD / ADHD Daughter   . ADD / ADHD Son   . Heart attack Maternal Grandfather        pacemaker  . Cancer Maternal Grandfather        ?  Marland Kitchen Cancer Paternal Grandmother        colon  . Colon cancer Maternal Grandmother   . Esophageal cancer Neg Hx   . Rectal cancer Neg Hx   . Stomach cancer Neg Hx     ROS: Review of Systems  Constitutional: Positive for weight loss.  Gastrointestinal: Negative for diarrhea, nausea and vomiting.  Endo/Heme/Allergies:       Negative polyphagia Negative hypoglycemia    PHYSICAL EXAM: Blood pressure 118/74, pulse 62, temperature 97.9 F (36.6 C), temperature source Oral, height 5\' 8"  (1.727 m), weight 241 lb (109.3 kg), SpO2 96 %. Body mass index is 36.64 kg/m. Physical Exam Vitals signs reviewed.  Constitutional:      Appearance: Normal appearance. She is obese.  Cardiovascular:     Rate and Rhythm: Normal rate.     Pulses: Normal pulses.  Pulmonary:     Effort: Pulmonary effort is normal.     Breath sounds: Normal breath sounds.  Musculoskeletal: Normal range of motion.  Skin:    General: Skin is warm and dry.  Neurological:     Mental Status: She is alert and oriented to person, place, and time.  Psychiatric:        Mood and Affect: Mood normal.        Behavior: Behavior normal.     RECENT LABS AND TESTS: BMET    Component Value Date/Time    NA 145 (H) 08/22/2018 1015   K 4.2 08/22/2018 1015   CL 105 08/22/2018 1015   CO2 22 08/22/2018 1015   GLUCOSE 104 (H) 08/22/2018 1015   GLUCOSE 110 (H) 02/05/2018 0951   BUN 26 (H) 08/22/2018 1015   CREATININE 0.80 08/22/2018 1015   CREATININE 0.59 06/04/2014  1029   CALCIUM 9.5 08/22/2018 1015   GFRNONAA 84 08/22/2018 1015   GFRAA 97 08/22/2018 1015   Lab Results  Component Value Date   HGBA1C 5.9 (H) 08/22/2018   HGBA1C 5.9 (H) 03/05/2018   HGBA1C 6.2 12/13/2017   HGBA1C 5.6 11/07/2016   HGBA1C 5.7 12/20/2015   Lab Results  Component Value Date   INSULIN 12.4 08/22/2018   INSULIN 20.3 03/05/2018   CBC    Component Value Date/Time   WBC 8.4 03/05/2018 0901   WBC 6.3 12/13/2017 1029   RBC 3.92 03/05/2018 0901   RBC 4.04 12/13/2017 1029   HGB 11.3 03/05/2018 0901   HCT 34.2 03/05/2018 0917   PLT 331 03/05/2018 0901   MCV 89 03/05/2018 0901   MCH 28.8 03/05/2018 0901   MCH 28.6 02/07/2016 1022   MCHC 32.3 03/05/2018 0901   MCHC 32.7 12/13/2017 1029   RDW 13.8 03/05/2018 0901   LYMPHSABS 1.9 03/05/2018 0901   MONOABS 0.3 02/07/2016 1022   EOSABS 0.2 03/05/2018 0901   BASOSABS 0.0 03/05/2018 0901   Iron/TIBC/Ferritin/ %Sat    Component Value Date/Time   IRON 50 03/05/2018 0917   TIBC 316 03/05/2018 0917   FERRITIN 47 03/05/2018 0917   IRONPCTSAT 16 03/05/2018 0917   Lipid Panel     Component Value Date/Time   CHOL 177 08/22/2018 1015   TRIG 109 08/22/2018 1015   HDL 55 08/22/2018 1015   CHOLHDL 3.2 08/22/2018 1015   CHOLHDL 3 12/13/2017 1029   VLDL 13.4 12/13/2017 1029   LDLCALC 100 (H) 08/22/2018 1015   Hepatic Function Panel     Component Value Date/Time   PROT 6.9 08/22/2018 1015   ALBUMIN 4.0 08/22/2018 1015   AST 13 08/22/2018 1015   ALT 12 08/22/2018 1015   ALKPHOS 49 08/22/2018 1015   BILITOT <0.2 08/22/2018 1015   BILIDIR 0.1 06/04/2014 1029   IBILI 0.3 06/04/2014 1029      Component Value Date/Time   TSH 1.900 03/05/2018 0901   TSH  1.19 12/13/2017 1029   TSH 1.00 11/07/2016 0929    ASSESSMENT AND PLAN: Prediabetes  Class 2 severe obesity with serious comorbidity and body mass index (BMI) of 36.0 to 36.9 in adult, unspecified obesity type (Misty Mahoney)  PLAN:  Pre-Diabetes Misty Mahoney will continue to work on weight loss, exercise, and decreasing simple carbohydrates in her diet to help decrease the risk of diabetes. We dicussed metformin including benefits and risks. She was informed that eating too many simple carbohydrates or too many calories at one sitting increases the likelihood of GI side effects. Misty Mahoney agrees to continue taking metformin and we will recheck labs at next visit. Misty Mahoney agrees to follow up with our clinic in 2 weeks as directed to monitor her progress.  I spent > than 50% of the 15 minute visit on counseling as documented in the note.  Obesity Misty Mahoney is currently in the action stage of change. As such, her goal is to continue with weight loss efforts She has Misty to follow the Category 2 plan Misty Mahoney has been instructed to work up to a goal of 150 minutes of combined cardio and strengthening exercise per week for weight loss and overall health benefits. We discussed the following Behavioral Modification Strategies Mahoney: work on meal planning and easy cooking plans and increase H20 intake   Misty Mahoney has Misty to follow up with our clinic in 2 weeks. She was informed of the importance of frequent follow up visits to maximize  her success with intensive lifestyle modifications for her multiple health conditions.   OBESITY BEHAVIORAL INTERVENTION VISIT  Mahoney's visit was # 14   Starting weight: 244 lbs Starting date: 03/05/18 Mahoney's weight : 241 lbs  Mahoney's date: 12/23/2018 Total lbs lost to date: 3    ASK: We discussed the diagnosis of obesity with Misty Mahoney Mahoney and Misty Mahoney Misty Mahoney.  ASSESS: Lanaya has the  diagnosis of obesity and her BMI Mahoney is 36.65 [place] is in the action stage of change   ADVISE: Misty Mahoney was educated on the multiple health risks of obesity as well as the benefit of weight loss to improve her health. She was advised of the need for long term treatment and the importance of lifestyle modifications.  AGREE: Multiple dietary modification options and treatment options were discussed and  Misty Mahoney Misty to the above obesity treatment plan.  Misty Mahoney, am acting as transcriptionist for Abby Potash, PA-C I, Abby Potash, PA-C have reviewed above note and agree with its content

## 2018-12-27 ENCOUNTER — Encounter: Payer: Self-pay | Admitting: Family Medicine

## 2018-12-30 MED FILL — IRBESARTAN 300 MG TAB: 300 | 90 days supply | Qty: 90 | Fill #0

## 2018-12-30 MED FILL — VIT D2 1.25 MG (50,000 UNIT: 1.25 MG | 28 days supply | Qty: 4 | Fill #0

## 2018-12-30 MED FILL — TRIAMTERENE/HCTZ 37.5/25 TB: 37.5-25 | 90 days supply | Qty: 90 | Fill #0

## 2019-01-03 ENCOUNTER — Ambulatory Visit: Payer: No Typology Code available for payment source | Admitting: Family Medicine

## 2019-01-07 ENCOUNTER — Encounter: Payer: Self-pay | Admitting: Family Medicine

## 2019-01-09 ENCOUNTER — Ambulatory Visit (INDEPENDENT_AMBULATORY_CARE_PROVIDER_SITE_OTHER): Payer: No Typology Code available for payment source | Admitting: Physician Assistant

## 2019-01-09 ENCOUNTER — Encounter (INDEPENDENT_AMBULATORY_CARE_PROVIDER_SITE_OTHER): Payer: Self-pay | Admitting: Physician Assistant

## 2019-01-09 VITALS — BP 132/79 | HR 65 | Temp 98.0°F | Ht 68.0 in | Wt 245.0 lb

## 2019-01-09 DIAGNOSIS — E559 Vitamin D deficiency, unspecified: Secondary | ICD-10-CM | POA: Diagnosis not present

## 2019-01-09 DIAGNOSIS — R7303 Prediabetes: Secondary | ICD-10-CM | POA: Diagnosis not present

## 2019-01-09 DIAGNOSIS — Z6837 Body mass index (BMI) 37.0-37.9, adult: Secondary | ICD-10-CM

## 2019-01-09 DIAGNOSIS — Z9189 Other specified personal risk factors, not elsewhere classified: Secondary | ICD-10-CM

## 2019-01-10 LAB — COMPREHENSIVE METABOLIC PANEL
ALT: 19 IU/L (ref 0–32)
AST: 19 IU/L (ref 0–40)
Albumin/Globulin Ratio: 1.4 (ref 1.2–2.2)
Albumin: 4.1 g/dL (ref 3.8–4.9)
Alkaline Phosphatase: 49 IU/L (ref 39–117)
BUN/Creatinine Ratio: 32 — ABNORMAL HIGH (ref 9–23)
BUN: 21 mg/dL (ref 6–24)
Bilirubin Total: 0.3 mg/dL (ref 0.0–1.2)
CALCIUM: 9.4 mg/dL (ref 8.7–10.2)
CO2: 22 mmol/L (ref 20–29)
Chloride: 102 mmol/L (ref 96–106)
Creatinine, Ser: 0.65 mg/dL (ref 0.57–1.00)
GFR calc non Af Amer: 102 mL/min/{1.73_m2} (ref >59)
GFR, EST AFRICAN AMERICAN: 117 mL/min/{1.73_m2} (ref >59)
Globulin, Total: 2.9 g/dL (ref 1.5–4.5)
Glucose: 83 mg/dL (ref 65–99)
Potassium: 4 mmol/L (ref 3.5–5.2)
Sodium: 143 mmol/L (ref 134–144)
TOTAL PROTEIN: 7 g/dL (ref 6.0–8.5)

## 2019-01-10 LAB — INSULIN, RANDOM: INSULIN: 12.5 u[IU]/mL (ref 2.6–24.9)

## 2019-01-10 LAB — VITAMIN D 25 HYDROXY (VIT D DEFICIENCY, FRACTURES): Vit D, 25-Hydroxy: 37 ng/mL (ref 30.0–100.0)

## 2019-01-10 LAB — HEMOGLOBIN A1C
Est. average glucose Bld gHb Est-mCnc: 117 mg/dL
Hgb A1c MFr Bld: 5.7 % — ABNORMAL HIGH (ref 4.8–5.6)

## 2019-01-12 NOTE — Progress Notes (Signed)
Office: [phone]  /  Fax: [phone]   HPI:   Chief Complaint: OBESITY Misty Mahoney is here to discuss her progress with her obesity treatment plan. She is on the Category 2 plan and is following her eating plan approximately 75 % of the time. She states she is doing yoga and walking for 45-60 minutes 3 times per week. Misty Mahoney reports that she traveled to the beach and ate too many sugary foods. She reports getting off track over the last 2 weeks. She is traveling to the beach in 2 weeks.  Her weight is 245 lb (111.1 kg) today and has gained 4 pounds since her last visit. She has lost 0 lbs since starting treatment with [place].  Pre-Diabetes Misty Mahoney has a diagnosis of pre-diabetes based on her elevated Hgb A1c and was informed this puts her at greater risk of developing diabetes. She is on metformin and denies nausea, vomiting, or diarrhea. She continues to work on diet and exercise to decrease risk of diabetes. She denies hypoglycemia.  At risk for diabetes Misty Mahoney is at higher than average risk for developing diabetes due to her obesity and pre-diabetes. She currently denies polyuria or polydipsia.  Vitamin D Deficiency Misty Mahoney has a diagnosis of vitamin D deficiency. She is currently taking prescription Vit D and denies nausea, vomiting or muscle weakness.  ALLERGIES: Allergies  Allergen Reactions  . Sunflower Oil Swelling    Seeds     MEDICATIONS: Current Outpatient Medications on File Prior to Visit  Medication Sig Dispense Refill  . aspirin EC 81 MG tablet Take 1 tablet (81 mg total) by mouth daily. 30 tablet 0  . Cholecalciferol (VITAMIN D) 2000 units CAPS Take 1 capsule by mouth daily.    Marland Kitchen diltiazem (CARDIZEM CD) 180 MG 24 hr capsule Take 1 capsule (180 mg total) by mouth 2 (two) times daily. 180 capsule 3  . ergocalciferol (VITAMIN D2) 50000 units capsule Take 1 capsule (50,000 Units total) by mouth once a week. 12 capsule 1  . escitalopram (LEXAPRO) 10 MG tablet Take 1 tablet (10  mg total) by mouth daily. 90 tablet 1  . flecainide (TAMBOCOR) 150 MG tablet Take 2 tablets (300 mg total) by mouth as needed. 18 tablet 1  . irbesartan (AVAPRO) 300 MG tablet Take 1 tablet (300 mg total) by mouth daily. 90 tablet 1  . Magnesium 250 MG TABS Take 1 tablet by mouth daily.    . metFORMIN (GLUCOPHAGE) 500 MG tablet Take 1 tablet (500 mg total) by mouth 2 (two) times daily with a meal. 180 tablet 1  . Omega-3 Fatty Acids (FISH OIL PO) Take 1 capsule by mouth daily.    Marland Kitchen triamterene-hydrochlorothiazide (MAXZIDE-25) 37.5-25 MG tablet Take 1 tablet by mouth daily. 90 tablet 1   No current facility-administered medications on file prior to visit.     PAST MEDICAL HISTORY: Past Medical History:  Diagnosis Date  . Anemia    gestational  . Atrial fibrillation ([place])   . Chicken pox as a child  . Depression 06/15/2015  . DM, gestational, diet controlled 04/10/2014   insulin resistant- A1C5.7   . Fatigue   . GERD (gastroesophageal reflux disease)    undx'd GERD   . H/O gestational diabetes mellitus, not currently pregnant 04/10/2014  . Heart murmur   . History of chicken pox   . Hyperlipidemia 12/20/2015  . Hypertension   . Joint pain   . Obesity, unspecified 04/10/2014  . OSA (obstructive sleep apnea)   . Osteoarthritis   .  Palpitations   . Prediabetes   . Preventative health care 12/26/2015  . Sleep apnea    wears cpap   . SVT (supraventricular tachycardia) ([place])    s/p RFCA 09/26/12  . Vitamin D deficiency 12/26/2015    PAST SURGICAL HISTORY: Past Surgical History:  Procedure Laterality Date  . ADENOIDECTOMY    . CARDIAC ELECTROPHYSIOLOGY STUDY AND ABLATION  2013  . ENDOVENOUS ABLATION SAPHENOUS VEIN W/ LASER Bilateral    Thermal ablation  . KNEE ARTHROSCOPY WITH MENISCAL REPAIR Left 08/25/2016   Procedure: KNEE ARTHROSCOPY WITH partial medial meniscectomy, chondroplasty;  Surgeon: Melrose Nakayama, MD;  Location: [facility];  Service: Orthopedics;   Laterality: Left;  KNEE ARTHROSCOPY WITH partial medial meniscectomy, chondroplasty  . PALATE / UVULA BIOPSY / EXCISION    . TONSILLECTOMY  53 yrs old  . V-TACH ABLATION N/A 09/26/2012   Procedure: V-TACH ABLATION;  Surgeon: Evans Lance, MD;  Location: [facility] CATH LAB;  Service: Cardiovascular;  Laterality: N/A;  . WISDOM TOOTH EXTRACTION  53 yrs old    SOCIAL HISTORY: Social History   Tobacco Use  . Smoking status: Never Smoker  . Smokeless tobacco: Never Used  Substance Use Topics  . Alcohol use: Yes    Comment: two or three times a year  . Drug use: No    FAMILY HISTORY: Family History  Problem Relation Age of Onset  . Hypertension Mother   . Dementia Mother   . Stroke Mother   . Dementia Father   . Hypertension Father   . Cataracts Father        bilateral  . Alzheimer's disease Father   . Colon polyps Father   . Cancer Sister 11       metastatic  . Hypertension Brother   . ADD / ADHD Daughter   . ADD / ADHD Son   . Heart attack Maternal Grandfather        pacemaker  . Cancer Maternal Grandfather        ?  Marland Kitchen Cancer Paternal Grandmother        colon  . Colon cancer Maternal Grandmother   . Esophageal cancer Neg Hx   . Rectal cancer Neg Hx   . Stomach cancer Neg Hx     ROS: Review of Systems  Constitutional: Negative for weight loss.  Gastrointestinal: Negative for diarrhea, nausea and vomiting.  Genitourinary: Negative for frequency.  Musculoskeletal:       Negative muscle weakness  Endo/Heme/Allergies: Negative for polydipsia.       Negative hypoglycemia    PHYSICAL EXAM: Blood pressure 132/79, pulse 65, temperature 98 F (36.7 C), temperature source Oral, height 5\' 8"  (1.727 m), weight 245 lb (111.1 kg), SpO2 98 %. Body mass index is 37.25 kg/m. Physical Exam Vitals signs reviewed.  Constitutional:      Appearance: Normal appearance. She is obese.  Cardiovascular:     Rate and Rhythm: Normal rate.     Pulses: Normal pulses.  Pulmonary:      Effort: Pulmonary effort is normal.     Breath sounds: Normal breath sounds.  Musculoskeletal: Normal range of motion.  Skin:    General: Skin is warm and dry.  Neurological:     Mental Status: She is alert and oriented to person, place, and time.  Psychiatric:        Mood and Affect: Mood normal.        Behavior: Behavior normal.     RECENT LABS AND TESTS: BMET  Component Value Date/Time   NA 143 01/09/2019 1326   K 4.0 01/09/2019 1326   CL 102 01/09/2019 1326   CO2 22 01/09/2019 1326   GLUCOSE 83 01/09/2019 1326   GLUCOSE 110 (H) 02/05/2018 0951   BUN 21 01/09/2019 1326   CREATININE 0.65 01/09/2019 1326   CREATININE 0.59 06/04/2014 1029   CALCIUM 9.4 01/09/2019 1326   GFRNONAA 102 01/09/2019 1326   GFRAA 117 01/09/2019 1326   Lab Results  Component Value Date   HGBA1C 5.7 (H) 01/09/2019   HGBA1C 5.9 (H) 08/22/2018   HGBA1C 5.9 (H) 03/05/2018   HGBA1C 6.2 12/13/2017   HGBA1C 5.6 11/07/2016   Lab Results  Component Value Date   INSULIN 12.5 01/09/2019   INSULIN 12.4 08/22/2018   INSULIN 20.3 03/05/2018   CBC    Component Value Date/Time   WBC 8.4 03/05/2018 0901   WBC 6.3 12/13/2017 1029   RBC 3.92 03/05/2018 0901   RBC 4.04 12/13/2017 1029   HGB 11.3 03/05/2018 0901   HCT 34.2 03/05/2018 0917   PLT 331 03/05/2018 0901   MCV 89 03/05/2018 0901   MCH 28.8 03/05/2018 0901   MCH 28.6 02/07/2016 1022   MCHC 32.3 03/05/2018 0901   MCHC 32.7 12/13/2017 1029   RDW 13.8 03/05/2018 0901   LYMPHSABS 1.9 03/05/2018 0901   MONOABS 0.3 02/07/2016 1022   EOSABS 0.2 03/05/2018 0901   BASOSABS 0.0 03/05/2018 0901   Iron/TIBC/Ferritin/ %Sat    Component Value Date/Time   IRON 50 03/05/2018 0917   TIBC 316 03/05/2018 0917   FERRITIN 47 03/05/2018 0917   IRONPCTSAT 16 03/05/2018 0917   Lipid Panel     Component Value Date/Time   CHOL 177 08/22/2018 1015   TRIG 109 08/22/2018 1015   HDL 55 08/22/2018 1015   CHOLHDL 3.2 08/22/2018 1015   CHOLHDL 3  12/13/2017 1029   VLDL 13.4 12/13/2017 1029   LDLCALC 100 (H) 08/22/2018 1015   Hepatic Function Panel     Component Value Date/Time   PROT 7.0 01/09/2019 1326   ALBUMIN 4.1 01/09/2019 1326   AST 19 01/09/2019 1326   ALT 19 01/09/2019 1326   ALKPHOS 49 01/09/2019 1326   BILITOT 0.3 01/09/2019 1326   BILIDIR 0.1 06/04/2014 1029   IBILI 0.3 06/04/2014 1029      Component Value Date/Time   TSH 1.900 03/05/2018 0901   TSH 1.19 12/13/2017 1029   TSH 1.00 11/07/2016 0929    ASSESSMENT AND PLAN: Prediabetes - Plan: Hemoglobin A1c, Insulin, random, Comprehensive metabolic panel  Vitamin D deficiency - Plan: VITAMIN D 25 Hydroxy (Vit-D Deficiency, Fractures)  At risk for diabetes mellitus  Class 2 severe obesity with serious comorbidity and body mass index (BMI) of 37.0 to 37.9 in adult, unspecified obesity type ([place])  PLAN:  Pre-Diabetes Myranda will continue to work on weight loss, exercise, and decreasing simple carbohydrates in her diet to help decrease the risk of diabetes. We dicussed metformin including benefits and risks. She was informed that eating too many simple carbohydrates or too many calories at one sitting increases the likelihood of GI side effects. Amariss agrees to continue taking metformin and we will check labs today. Jaide agrees to follow up with our clinic in 2 weeks as directed to monitor her progress.  Diabetes risk counseling Ashwini was given extended (15 minutes) diabetes prevention counseling today. She is 53 y.o. female and has risk factors for diabetes including obesity and pre-diabetes. We discussed intensive lifestyle modifications today  with an emphasis on weight loss as well as increasing exercise and decreasing simple carbohydrates in her diet.  Vitamin D Deficiency Haile was informed that low vitamin D levels contributes to fatigue and are associated with obesity, breast, and colon cancer. Koa agrees to continue taking prescription Vit D @50 ,000 IU  every week and will follow up for routine testing of vitamin D, at least 2-3 times per year. She was informed of the risk of over-replacement of vitamin D and agrees to not increase her dose unless she discusses this with [place] first. We will check labs today. Rossi agrees to follow up with our clinic in 2 weeks.  Obesity Elgie is currently in the action stage of change. As such, her goal is to continue with weight loss efforts She has agreed to follow the Category 2 plan Naydelin has been instructed to work up to a goal of 150 minutes of combined cardio and strengthening exercise per week for weight loss and overall health benefits. We discussed the following Behavioral Modification Strategies today: increasing lean protein intake, increase H20 intake, and travel eating strategies    Letoya has agreed to follow up with our clinic in 2 weeks. She was informed of the importance of frequent follow up visits to maximize her success with intensive lifestyle modifications for her multiple health conditions.   OBESITY BEHAVIORAL INTERVENTION VISIT  Today's visit was # 15   Starting weight: 244 lbs Starting date: 03/05/18 Today's weight : 245 lbs  Today's date: 01/09/2019 Total lbs lost to date: 0    ASK: We discussed the diagnosis of obesity with [name] today and Yarielis agreed to give [place] permission to discuss obesity behavioral modification therapy today.  ASSESS: Amal has the diagnosis of obesity and her BMI today is 37.26 Toniya is in the action stage of change   ADVISE: Dimond was educated on the multiple health risks of obesity as well as the benefit of weight loss to improve her health. She was advised of the need for long term treatment and the importance of lifestyle modifications.  AGREE: Multiple dietary modification options and treatment options were discussed and  Emmett agreed to the above obesity treatment plan.  Wilhemena Durie, am acting as transcriptionist for  Abby Potash, PA-C I, Abby Potash, PA-C have reviewed above note and agree with its content

## 2019-01-17 ENCOUNTER — Encounter: Payer: Self-pay | Admitting: Family Medicine

## 2019-01-17 ENCOUNTER — Ambulatory Visit (INDEPENDENT_AMBULATORY_CARE_PROVIDER_SITE_OTHER): Payer: No Typology Code available for payment source | Admitting: Family Medicine

## 2019-01-17 VITALS — BP 142/78 | HR 67 | Temp 99.1°F | Resp 18 | Ht 68.0 in | Wt 252.2 lb

## 2019-01-17 DIAGNOSIS — R030 Elevated blood-pressure reading, without diagnosis of hypertension: Secondary | ICD-10-CM

## 2019-01-17 DIAGNOSIS — D3501 Benign neoplasm of right adrenal gland: Secondary | ICD-10-CM

## 2019-01-17 DIAGNOSIS — I729 Aneurysm of unspecified site: Secondary | ICD-10-CM

## 2019-01-17 DIAGNOSIS — I10 Essential (primary) hypertension: Secondary | ICD-10-CM

## 2019-01-17 DIAGNOSIS — E559 Vitamin D deficiency, unspecified: Secondary | ICD-10-CM

## 2019-01-17 DIAGNOSIS — I480 Paroxysmal atrial fibrillation: Secondary | ICD-10-CM | POA: Diagnosis not present

## 2019-01-17 DIAGNOSIS — E278 Other specified disorders of adrenal gland: Secondary | ICD-10-CM | POA: Diagnosis not present

## 2019-01-17 DIAGNOSIS — E785 Hyperlipidemia, unspecified: Secondary | ICD-10-CM

## 2019-01-17 DIAGNOSIS — G4733 Obstructive sleep apnea (adult) (pediatric): Secondary | ICD-10-CM

## 2019-01-17 DIAGNOSIS — E6609 Other obesity due to excess calories: Secondary | ICD-10-CM

## 2019-01-17 DIAGNOSIS — R739 Hyperglycemia, unspecified: Secondary | ICD-10-CM

## 2019-01-17 MED ORDER — SCOPOLAMINE 1 MG/3DAYS TD PT72: 1.0000 | MEDICATED_PATCH | TRANSDERMAL | 0 refills | Status: DC

## 2019-01-17 NOTE — Assessment & Plan Note (Signed)
LUQ aneurysm notedon previous CT scan will proceed with CT angiogram, asymptomatic

## 2019-01-17 NOTE — Patient Instructions (Signed)
Carbohydrate Counting for Diabetes Mellitus, Adult  Carbohydrate counting is a method of keeping track of how many carbohydrates you eat. Eating carbohydrates naturally increases the amount of sugar (glucose) in the blood. Counting how many carbohydrates you eat helps keep your blood glucose within normal limits, which helps you manage your diabetes (diabetes mellitus). It is important to know how many carbohydrates you can safely have in each meal. This is different for every person. A diet and nutrition specialist (registered dietitian) can help you make a meal plan and calculate how many carbohydrates you should have at each meal and snack. Carbohydrates are found in the following foods:  Grains, such as breads and cereals.  Dried beans and soy products.  Starchy vegetables, such as potatoes, peas, and corn.  Fruit and fruit juices.  Milk and yogurt.  Sweets and snack foods, such as cake, cookies, candy, chips, and soft drinks. How do I count carbohydrates? There are two ways to count carbohydrates in food. You can use either of the methods or a combination of both. Reading "Nutrition Facts" on packaged food The "Nutrition Facts" list is included on the labels of almost all packaged foods and beverages in the [place] It includes:  The serving size.  Information about nutrients in each serving, including the grams (g) of carbohydrate per serving. To use the "Nutrition Facts":  Decide how many servings you will have.  Multiply the number of servings by the number of carbohydrates per serving.  The resulting number is the total amount of carbohydrates that you will be having. Learning standard serving sizes of other foods When you eat carbohydrate foods that are not packaged or do not include "Nutrition Facts" on the label, you need to measure the servings in order to count the amount of carbohydrates:  Measure the foods that you will eat with a food scale or measuring cup, if needed.   Decide how many standard-size servings you will eat.  Multiply the number of servings by 15. Most carbohydrate-rich foods have about 15 g of carbohydrates per serving. ? For example, if you eat 8 oz (170 g) of strawberries, you will have eaten 2 servings and 30 g of carbohydrates (2 servings x 15 g = 30 g).  For foods that have more than one food mixed, such as soups and casseroles, you must count the carbohydrates in each food that is included. The following list contains standard serving sizes of common carbohydrate-rich foods. Each of these servings has about 15 g of carbohydrates:   hamburger bun or  English muffin.   oz (15 mL) syrup.   oz (14 g) jelly.  1 slice of bread.  1 six-inch tortilla.  3 oz (85 g) cooked rice or pasta.  4 oz (113 g) cooked dried beans.  4 oz (113 g) starchy vegetable, such as peas, corn, or potatoes.  4 oz (113 g) hot cereal.  4 oz (113 g) mashed potatoes or  of a large baked potato.  4 oz (113 g) canned or frozen fruit.  4 oz (120 mL) fruit juice.  4-6 crackers.  6 chicken nuggets.  6 oz (170 g) unsweetened dry cereal.  6 oz (170 g) plain fat-free yogurt or yogurt sweetened with artificial sweeteners.  8 oz (240 mL) milk.  8 oz (170 g) fresh fruit or one small piece of fruit.  24 oz (680 g) popped popcorn. Example of carbohydrate counting Sample meal  3 oz (85 g) chicken breast.  6 oz (170 g)   brown rice.  4 oz (113 g) corn.  8 oz (240 mL) milk.  8 oz (170 g) strawberries with sugar-free whipped topping. Carbohydrate calculation 1. Identify the foods that contain carbohydrates: ? Rice. ? Corn. ? Milk. ? Strawberries. 2. Calculate how many servings you have of each food: ? 2 servings rice. ? 1 serving corn. ? 1 serving milk. ? 1 serving strawberries. 3. Multiply each number of servings by 15 g: ? 2 servings rice x 15 g = 30 g. ? 1 serving corn x 15 g = 15 g. ? 1 serving milk x 15 g = 15 g. ? 1 serving  strawberries x 15 g = 15 g. 4. Add together all of the amounts to find the total grams of carbohydrates eaten: ? 30 g + 15 g + 15 g + 15 g = 75 g of carbohydrates total. Summary  Carbohydrate counting is a method of keeping track of how many carbohydrates you eat.  Eating carbohydrates naturally increases the amount of sugar (glucose) in the blood.  Counting how many carbohydrates you eat helps keep your blood glucose within normal limits, which helps you manage your diabetes.  A diet and nutrition specialist (registered dietitian) can help you make a meal plan and calculate how many carbohydrates you should have at each meal and snack. This information is not intended to replace advice given to you by your health care provider. Make sure you discuss any questions you have with your health care provider. Document Released: 11/06/2005 Document Revised: 05/16/2017 Document Reviewed: 04/19/2016 Elsevier Interactive Patient Education  2019 [employer]  

## 2019-01-17 NOTE — Assessment & Plan Note (Signed)
Using CPAP nightly 

## 2019-01-17 NOTE — Assessment & Plan Note (Signed)
Encouraged heart healthy diet, increase exercise, avoid trans fats, consider a krill oil cap daily 

## 2019-01-17 NOTE — Assessment & Plan Note (Deleted)
She has not been taking her CCB. She will monitor her BP and restart if need be. Encouraged heart healthy diet such as the DASH diet and exercise as tolerated.

## 2019-01-17 NOTE — Assessment & Plan Note (Signed)
Controlled on Tambicor

## 2019-01-17 NOTE — Progress Notes (Signed)
Subjective:    Patient ID: Misty Mahoney, female    DOB: 27-Feb-1966, 53 y.o.   MRN: [id number]  No chief complaint on file.   HPI Patient is in today for follow up. She feels well today. No recent febrile illness or hospitalizations. No polyuria or polydipsia. She has been working with healthy weight and wellness and her weight is frustrating her but she continues to try. Denies CP/palp/SOB/HA/congestion/fevers/GI or GU c/o. Taking meds as prescribed  Past Medical History:  Diagnosis Date  . Anemia    gestational  . Atrial fibrillation ([place])   . Chicken pox as a child  . Depression 06/15/2015  . DM, gestational, diet controlled 04/10/2014   insulin resistant- A1C5.7   . Fatigue   . GERD (gastroesophageal reflux disease)    undx'd GERD   . H/O gestational diabetes mellitus, not currently pregnant 04/10/2014  . Heart murmur   . History of chicken pox   . Hyperlipidemia 12/20/2015  . Hypertension   . Joint pain   . Obesity, unspecified 04/10/2014  . OSA (obstructive sleep apnea)   . Osteoarthritis   . Palpitations   . Prediabetes   . Preventative health care 12/26/2015  . Sleep apnea    wears cpap   . SVT (supraventricular tachycardia) ([place])    s/p RFCA 09/26/12  . Vitamin D deficiency 12/26/2015    Past Surgical History:  Procedure Laterality Date  . ADENOIDECTOMY    . CARDIAC ELECTROPHYSIOLOGY STUDY AND ABLATION  2013  . ENDOVENOUS ABLATION SAPHENOUS VEIN W/ LASER Bilateral    Thermal ablation  . KNEE ARTHROSCOPY WITH MENISCAL REPAIR Left 08/25/2016   Procedure: KNEE ARTHROSCOPY WITH partial medial meniscectomy, chondroplasty;  Surgeon: Melrose Nakayama, MD;  Location: [place];  Service: Orthopedics;  Laterality: Left;  KNEE ARTHROSCOPY WITH partial medial meniscectomy, chondroplasty  . PALATE / UVULA BIOPSY / EXCISION    . TONSILLECTOMY  53 yrs old  . V-TACH ABLATION N/A 09/26/2012   Procedure: V-TACH ABLATION;  Surgeon: Evans Lance, MD;   Location[facility] CATH LAB;  Service: Cardiovascular;  Laterality: N/A;  . WISDOM TOOTH EXTRACTION  53 yrs old    Family History  Problem Relation Age of Onset  . Hypertension Mother   . Dementia Mother   . Stroke Mother   . Dementia Father   . Hypertension Father   . Cataracts Father        bilateral  . Alzheimer's disease Father   . Colon polyps Father   . Cancer Sister 42       metastatic  . Hypertension Brother   . ADD / ADHD Daughter   . ADD / ADHD Son   . Heart attack Maternal Grandfather        pacemaker  . Cancer Maternal Grandfather        ?  Marland Kitchen Cancer Paternal Grandmother        colon  . Colon cancer Maternal Grandmother   . Esophageal cancer Neg Hx   . Rectal cancer Neg Hx   . Stomach cancer Neg Hx     Social History   Socioeconomic History  . Marital status: Significant Other    Spouse name: Misty Mahoney  . Number of children: 2  . Years of education: Not on file  . Highest education level: Not on file  Occupational History  . Occupation: Therapist, sports  Social Needs  . Financial resource strain: Not on file  . Food insecurity:    Worry:  Not on file    Inability: Not on file  . Transportation needs:    Medical: Not on file    Non-medical: Not on file  Tobacco Use  . Smoking status: Never Smoker  . Smokeless tobacco: Never Used  Substance and Sexual Activity  . Alcohol use: Yes    Comment: two or three times a year  . Drug use: No  . Sexual activity: Yes    Partners: Male    Comment: lives with kids, significant other and step daughter. minimizing dairy and gluten, exercise  Lifestyle  . Physical activity:    Days per week: Not on file    Minutes per session: Not on file  . Stress: Not on file  Relationships  . Social connections:    Talks on phone: Not on file    Gets together: Not on file    Attends religious service: Not on file    Active member of club or organization: Not on file    Attends meetings of clubs or organizations: Not on file     Relationship status: Not on file  . Intimate partner violence:    Fear of current or ex partner: Not on file    Emotionally abused: Not on file    Physically abused: Not on file    Forced sexual activity: Not on file  Other Topics Concern  . Not on file  Social History Narrative  . Not on file    Outpatient Medications Prior to Visit  Medication Sig Dispense Refill  . aspirin EC 81 MG tablet Take 1 tablet (81 mg total) by mouth daily. 30 tablet 0  . Cholecalciferol (VITAMIN D) 2000 units CAPS Take 1 capsule by mouth daily.    Marland Kitchen diltiazem (CARDIZEM CD) 180 MG 24 hr capsule Take 1 capsule (180 mg total) by mouth 2 (two) times daily. 180 capsule 3  . ergocalciferol (VITAMIN D2) 50000 units capsule Take 1 capsule (50,000 Units total) by mouth once a week. 12 capsule 1  . escitalopram (LEXAPRO) 10 MG tablet Take 1 tablet (10 mg total) by mouth daily. 90 tablet 1  . flecainide (TAMBOCOR) 150 MG tablet Take 2 tablets (300 mg total) by mouth as needed. 18 tablet 1  . irbesartan (AVAPRO) 300 MG tablet Take 1 tablet (300 mg total) by mouth daily. 90 tablet 1  . Magnesium 250 MG TABS Take 1 tablet by mouth daily.    . metFORMIN (GLUCOPHAGE) 500 MG tablet Take 1 tablet (500 mg total) by mouth 2 (two) times daily with a meal. 180 tablet 1  . Omega-3 Fatty Acids (FISH OIL PO) Take 1 capsule by mouth daily.    Marland Kitchen triamterene-hydrochlorothiazide (MAXZIDE-25) 37.5-25 MG tablet Take 1 tablet by mouth daily. 90 tablet 1   No facility-administered medications prior to visit.     Allergies  Allergen Reactions  . Sunflower Oil Swelling    Seeds     Review of Systems  Constitutional: Positive for malaise/fatigue. Negative for fever.  HENT: Negative for congestion.   Eyes: Negative for blurred vision.  Respiratory: Negative for shortness of breath.   Cardiovascular: Negative for chest pain, palpitations and leg swelling.  Gastrointestinal: Negative for abdominal pain, blood in stool and nausea.    Genitourinary: Negative for dysuria and frequency.  Musculoskeletal: Negative for falls.  Skin: Negative for rash.  Neurological: Negative for dizziness, loss of consciousness and headaches.  Endo/Heme/Allergies: Negative for environmental allergies.  Psychiatric/Behavioral: Negative for depression. The patient is not nervous/anxious.  Objective:    Physical Exam Vitals signs and nursing note reviewed.  Constitutional:      General: She is not in acute distress.    Appearance: She is well-developed.  HENT:     Head: Normocephalic and atraumatic.     Nose: Nose normal.  Eyes:     General:        Right eye: No discharge.        Left eye: No discharge.  Neck:     Musculoskeletal: Normal range of motion and neck supple.  Cardiovascular:     Rate and Rhythm: Normal rate and regular rhythm.     Heart sounds: No murmur.  Pulmonary:     Effort: Pulmonary effort is normal.     Breath sounds: Normal breath sounds.  Abdominal:     General: Bowel sounds are normal.     Palpations: Abdomen is soft.     Tenderness: There is no abdominal tenderness.  Skin:    General: Skin is warm and dry.  Neurological:     Mental Status: She is alert and oriented to person, place, and time.     BP (!) 142/78 (BP Location: Left Arm, Patient Position: Sitting, Cuff Size: Large)   Pulse 67   Temp 99.1 F (37.3 C) (Oral)   Resp 18   Ht 5\' 8"  (1.727 m)   Wt 252 lb 3.2 oz (114.4 kg)   SpO2 96%   BMI 38.35 kg/m  Wt Readings from Last 3 Encounters:  01/17/19 252 lb 3.2 oz (114.4 kg)  01/09/19 245 lb (111.1 kg)  12/23/18 241 lb (109.3 kg)     Lab Results  Component Value Date   WBC 8.4 03/05/2018   HGB 11.3 03/05/2018   HCT 34.2 03/05/2018   PLT 331 03/05/2018   GLUCOSE 83 01/09/2019   CHOL 177 08/22/2018   TRIG 109 08/22/2018   HDL 55 08/22/2018   LDLCALC 100 (H) 08/22/2018   ALT 19 01/09/2019   AST 19 01/09/2019   NA 143 01/09/2019   K 4.0 01/09/2019   CL 102 01/09/2019    CREATININE 0.65 01/09/2019   BUN 21 01/09/2019   CO2 22 01/09/2019   TSH 1.900 03/05/2018   HGBA1C 5.7 (H) 01/09/2019    Lab Results  Component Value Date   TSH 1.900 03/05/2018   Lab Results  Component Value Date   WBC 8.4 03/05/2018   HGB 11.3 03/05/2018   HCT 34.2 03/05/2018   MCV 89 03/05/2018   PLT 331 03/05/2018   Lab Results  Component Value Date   NA 143 01/09/2019   K 4.0 01/09/2019   CO2 22 01/09/2019   GLUCOSE 83 01/09/2019   BUN 21 01/09/2019   CREATININE 0.65 01/09/2019   BILITOT 0.3 01/09/2019   ALKPHOS 49 01/09/2019   AST 19 01/09/2019   ALT 19 01/09/2019   PROT 7.0 01/09/2019   ALBUMIN 4.1 01/09/2019   CALCIUM 9.4 01/09/2019   ANIONGAP 12 08/21/2016   GFR 81.02 02/05/2018   Lab Results  Component Value Date   CHOL 177 08/22/2018   Lab Results  Component Value Date   HDL 55 08/22/2018   Lab Results  Component Value Date   LDLCALC 100 (H) 08/22/2018   Lab Results  Component Value Date   TRIG 109 08/22/2018   Lab Results  Component Value Date   CHOLHDL 3.2 08/22/2018   Lab Results  Component Value Date   HGBA1C 5.7 (H) 01/09/2019  Assessment & Plan:   Problem List Items Addressed This Visit    Atrial fibrillation ([place])    Controlled on [place]      OSA (obstructive sleep apnea)    Using CPAP nightly      Hypertension    She has not been taking her CCB. She will monitor her BP and restart if need be. Encouraged heart healthy diet such as the DASH diet and exercise as tolerated.       Obesity    Encouraged DASH diet, decrease po intake and increase exercise as tolerated. Needs 7-8 hours of sleep nightly. Avoid trans fats, eat small, frequent meals every 4-5 hours with lean proteins, complex carbs and healthy fats. Minimize simple carbs      Hyperlipidemia    Encouraged heart healthy diet, increase exercise, avoid trans fats, consider a krill oil cap daily      Hyperglycemia    hgba1c acceptable, minimize simple  carbs. Increase exercise as tolerated.       Vitamin D deficiency    Supplement and monitor      Adrenal adenoma    Will repeat imaging to assess stability      Aneurysm ([place]) - Primary    LUQ aneurysm notedon previous CT scan will proceed with CT angiogram, asymptomatic      Relevant Orders   CT ANGIO ABDOMEN W &/OR WO CONTRAST   RESOLVED: Elevated BP without diagnosis of hypertension    Other Visit Diagnoses    Adrenal mass ([place])       Relevant Orders   CT ANGIO ABDOMEN W &/OR WO CONTRAST      I am having Tahani F. Chrissie Noa start on scopolamine. I am also having her maintain her Vitamin D, Omega-3 Fatty Acids (FISH OIL PO), diltiazem, flecainide, Magnesium, aspirin EC, irbesartan, triamterene-hydrochlorothiazide, escitalopram, metFORMIN, and ergocalciferol.  Meds ordered this encounter  Medications  . scopolamine (TRANSDERM-SCOP, 1.5 MG,) 1 MG/3DAYS    Sig: Place 1 patch (1.5 mg total) onto the skin every 3 (three) days.    Dispense:  3 patch    Refill:  0     Penni Homans, MD

## 2019-01-17 NOTE — Assessment & Plan Note (Signed)
hgba1c acceptable, minimize simple carbs. Increase exercise as tolerated.  

## 2019-01-17 NOTE — Assessment & Plan Note (Signed)
Will repeat imaging to assess stability

## 2019-01-17 NOTE — Assessment & Plan Note (Signed)
Supplement and monitor 

## 2019-01-17 NOTE — Assessment & Plan Note (Signed)
She has not been taking her CCB. She will monitor her BP and restart if need be. Encouraged heart healthy diet such as the DASH diet and exercise as tolerated.

## 2019-01-17 NOTE — Assessment & Plan Note (Signed)
Encouraged DASH diet, decrease po intake and increase exercise as tolerated. Needs 7-8 hours of sleep nightly. Avoid trans fats, eat small, frequent meals every 4-5 hours with lean proteins, complex carbs and healthy fats. Minimize simple carbs 

## 2019-01-27 ENCOUNTER — Ambulatory Visit (INDEPENDENT_AMBULATORY_CARE_PROVIDER_SITE_OTHER): Payer: No Typology Code available for payment source | Admitting: Physician Assistant

## 2019-01-27 ENCOUNTER — Encounter (INDEPENDENT_AMBULATORY_CARE_PROVIDER_SITE_OTHER): Payer: Self-pay | Admitting: Physician Assistant

## 2019-01-27 VITALS — BP 150/74 | HR 62 | Temp 97.8°F | Ht 68.0 in | Wt 245.0 lb

## 2019-01-27 DIAGNOSIS — E559 Vitamin D deficiency, unspecified: Secondary | ICD-10-CM | POA: Diagnosis not present

## 2019-01-27 DIAGNOSIS — R7303 Prediabetes: Secondary | ICD-10-CM | POA: Diagnosis not present

## 2019-01-27 DIAGNOSIS — Z9189 Other specified personal risk factors, not elsewhere classified: Secondary | ICD-10-CM

## 2019-01-27 DIAGNOSIS — Z6837 Body mass index (BMI) 37.0-37.9, adult: Secondary | ICD-10-CM

## 2019-01-27 MED ORDER — VITAMIN D (ERGOCALCIFEROL) 1.25 MG (50000 UNIT) PO CAPS: 50000.0000 [IU] | ORAL_CAPSULE | ORAL | 0 refills | Status: DC

## 2019-01-27 MED FILL — VIT D2 1.25 MG (50,000 UNIT: 1.25 MG | 28 days supply | Qty: 4 | Fill #0

## 2019-01-27 NOTE — Progress Notes (Signed)
Office: [phone]  /  Fax: [phone]   HPI:   Chief Complaint: OBESITY Misty Mahoney is here to discuss her progress with her obesity treatment plan. She is on the Category 2 plan and is following her eating plan approximately 75% of the time. She states she is walking 45 minutes 2 times per week and doing yoga 90 minutes 2 times per week. Misty Mahoney reports that she was at the beach this weekend and indulged in sweets and sugary drinks. She is ready to get back on track.  Her weight is 245 lb (111.1 kg) today and has not lost weight since her last visit. She has lost 0 lbs since starting treatment with [place].  Vitamin D deficiency Misty Mahoney has a diagnosis of Vitamin D deficiency. She is currently taking prescription Vit D and denies nausea, vomiting or muscle weakness.  At risk for osteopenia and osteoporosis Misty Mahoney is at higher risk of osteopenia and osteoporosis due to Vitamin D deficiency.   Pre-Diabetes Misty Mahoney has a diagnosis of prediabetes based on her elevated Hgb A1c and was informed this puts her at greater risk of developing diabetes. She is not taking metformin currently and continues to work on diet and exercise to decrease risk of diabetes. She denies nausea, vomiting, diarrhea, or polyphagia.  ASSESSMENT AND PLAN:  Vitamin D deficiency - Plan: Vitamin D, Ergocalciferol, (DRISDOL) 1.25 MG (50000 UT) CAPS capsule  Prediabetes  At risk for osteoporosis  Class 2 severe obesity with serious comorbidity and body mass index (BMI) of 37.0 to 37.9 in adult, unspecified obesity type ([place])  PLAN:  Vitamin D Deficiency Misty Mahoney was informed that low Vitamin D levels contributes to fatigue and are associated with obesity, breast, and colon cancer. She agrees to continue to take prescription Vit D @ 50,000 IU every week #4 with 0 refills and will follow-up for routine testing of Vitamin D, at least 2-3 times per year. She was informed of the risk of over-replacement of Vitamin D and agrees to not  increase her dose unless she discusses this with [place] first. Misty Mahoney agrees to follow-up with our clinic in 3 weeks.  At risk for osteopenia and osteoporosis Misty Mahoney was given extended  (15 minutes) osteoporosis prevention counseling today. Misty Mahoney is at risk for osteopenia and osteoporsis due to her Vitamin D deficiency. She was encouraged to take her Vitamin D and follow her higher calcium diet and increase strengthening exercise to help strengthen her bones and decrease her risk of osteopenia and osteoporosis.  Pre-Diabetes Misty Mahoney will continue to work on weight loss, exercise, and decreasing simple carbohydrates in her diet to help decrease the risk of diabetes. We dicussed metformin including benefits and risks. She was informed that eating too many simple carbohydrates or too many calories at one sitting increases the likelihood of GI side effects. Misty Mahoney is on metformin for now and a prescription was not written today. Misty Mahoney agreed to follow up with [place] as directed to monitor her progress.  Obesity Misty Mahoney is currently in the action stage of change. As such, her goal is to continue with weight loss efforts. She has agreed to follow the Category 2 plan. Misty Mahoney has been instructed to work up to a goal of 150 minutes of combined cardio and strengthening exercise per week for weight loss and overall health benefits. We discussed the following Behavioral Modification Strategies today: work on meal planning and easy cooking plans and better snacking choices.  Misty Mahoney has agreed to follow-up with our clinic in 3 weeks. She  was informed of the importance of frequent follow up visits to maximize her success with intensive lifestyle modifications for her multiple health conditions.  ALLERGIES: Allergies  Allergen Reactions   Sunflower Oil Swelling    Seeds     MEDICATIONS: Current Outpatient Medications on File Prior to Visit  Medication Sig Dispense Refill   aspirin EC 81 MG tablet Take 1 tablet (81 mg  total) by mouth daily. 30 tablet 0   Cholecalciferol (VITAMIN D) 2000 units CAPS Take 1 capsule by mouth daily.     diltiazem (CARDIZEM CD) 180 MG 24 hr capsule Take 1 capsule (180 mg total) by mouth 2 (two) times daily. 180 capsule 3   ergocalciferol (VITAMIN D2) 50000 units capsule Take 1 capsule (50,000 Units total) by mouth once a week. 12 capsule 1   escitalopram (LEXAPRO) 10 MG tablet Take 1 tablet (10 mg total) by mouth daily. 90 tablet 1   flecainide (TAMBOCOR) 150 MG tablet Take 2 tablets (300 mg total) by mouth as needed. 18 tablet 1   irbesartan (AVAPRO) 300 MG tablet Take 1 tablet (300 mg total) by mouth daily. 90 tablet 1   Magnesium 250 MG TABS Take 1 tablet by mouth daily.     metFORMIN (GLUCOPHAGE) 500 MG tablet Take 1 tablet (500 mg total) by mouth 2 (two) times daily with a meal. 180 tablet 1   Omega-3 Fatty Acids (FISH OIL PO) Take 1 capsule by mouth daily.     scopolamine (TRANSDERM-SCOP, 1.5 MG,) 1 MG/3DAYS Place 1 patch (1.5 mg total) onto the skin every 3 (three) days. 3 patch 0   triamterene-hydrochlorothiazide (MAXZIDE-25) 37.5-25 MG tablet Take 1 tablet by mouth daily. 90 tablet 1   No current facility-administered medications on file prior to visit.     PAST MEDICAL HISTORY: Past Medical History:  Diagnosis Date   Anemia    gestational   Atrial fibrillation ([place])    Chicken pox as a child   Depression 06/15/2015   DM, gestational, diet controlled 04/10/2014   insulin resistant- A1C5.7    Fatigue    GERD (gastroesophageal reflux disease)    undx'd GERD    H/O gestational diabetes mellitus, not currently pregnant 04/10/2014   Heart murmur    History of chicken pox    Hyperlipidemia 12/20/2015   Hypertension    Joint pain    Obesity, unspecified 04/10/2014   OSA (obstructive sleep apnea)    Osteoarthritis    Palpitations    Prediabetes    Preventative health care 12/26/2015   Sleep apnea    wears cpap    SVT (supraventricular  tachycardia) ([place])    s/p RFCA 09/26/12   Vitamin D deficiency 12/26/2015    PAST SURGICAL HISTORY: Past Surgical History:  Procedure Laterality Date   ADENOIDECTOMY     CARDIAC ELECTROPHYSIOLOGY STUDY AND ABLATION  2013   ENDOVENOUS ABLATION SAPHENOUS VEIN W/ LASER Bilateral    Thermal ablation   KNEE ARTHROSCOPY WITH MENISCAL REPAIR Left 08/25/2016   Procedure: KNEE ARTHROSCOPY WITH partial medial meniscectomy, chondroplasty;  Surgeon: Melrose Nakayama, MD;  Location: [place];  Service: Orthopedics;  Laterality: Left;  KNEE ARTHROSCOPY WITH partial medial meniscectomy, chondroplasty   PALATE / UVULA BIOPSY / EXCISION     TONSILLECTOMY  53 yrs old   V-TACH ABLATION N/A 09/26/2012   Procedure: V-TACH ABLATION;  Surgeon: Evans Lance, MD;  Location: [facility] CATH LAB;  Service: Cardiovascular;  Laterality: N/A;   WISDOM TOOTH EXTRACTION  53  yrs old    SOCIAL HISTORY: Social History   Tobacco Use   Smoking status: Never Smoker   Smokeless tobacco: Never Used  Substance Use Topics   Alcohol use: Yes    Comment: two or three times a year   Drug use: No    FAMILY HISTORY: Family History  Problem Relation Age of Onset   Hypertension Mother    Dementia Mother    Stroke Mother    Dementia Father    Hypertension Father    Cataracts Father        bilateral   Alzheimer's disease Father    Colon polyps Father    Cancer Sister 8       metastatic   Hypertension Brother    ADD / ADHD Daughter    ADD / ADHD Son    Heart attack Maternal Grandfather        pacemaker   Cancer Maternal Grandfather        ?   Cancer Paternal Grandmother        colon   Colon cancer Maternal Grandmother    Esophageal cancer Neg Hx    Rectal cancer Neg Hx    Stomach cancer Neg Hx    ROS: Review of Systems  Constitutional: Negative for weight loss.  Gastrointestinal: Negative for diarrhea, nausea and vomiting.  Musculoskeletal:       Negative for muscle  weakness.  Endo/Heme/Allergies:       Negative for polyphagia. Negative for hypoglycemia.   PHYSICAL EXAM: Blood pressure (!) 150/74, pulse 62, temperature 97.8 F (36.6 C), height 5\' 8"  (1.727 m), weight 245 lb (111.1 kg), SpO2 97 %. Body mass index is 37.25 kg/m. Physical Exam Vitals signs reviewed.  Constitutional:      Appearance: Normal appearance. She is obese.  Cardiovascular:     Rate and Rhythm: Normal rate.     Pulses: Normal pulses.  Pulmonary:     Effort: Pulmonary effort is normal.     Breath sounds: Normal breath sounds.  Musculoskeletal: Normal range of motion.  Skin:    General: Skin is warm and dry.  Neurological:     Mental Status: She is alert and oriented to person, place, and time.  Psychiatric:        Behavior: Behavior normal.   RECENT LABS AND TESTS: BMET    Component Value Date/Time   NA 143 01/09/2019 1326   K 4.0 01/09/2019 1326   CL 102 01/09/2019 1326   CO2 22 01/09/2019 1326   GLUCOSE 83 01/09/2019 1326   GLUCOSE 110 (H) 02/05/2018 0951   BUN 21 01/09/2019 1326   CREATININE 0.65 01/09/2019 1326   CREATININE 0.59 06/04/2014 1029   CALCIUM 9.4 01/09/2019 1326   GFRNONAA 102 01/09/2019 1326   GFRAA 117 01/09/2019 1326   Lab Results  Component Value Date   HGBA1C 5.7 (H) 01/09/2019   HGBA1C 5.9 (H) 08/22/2018   HGBA1C 5.9 (H) 03/05/2018   HGBA1C 6.2 12/13/2017   HGBA1C 5.6 11/07/2016   Lab Results  Component Value Date   INSULIN 12.5 01/09/2019   INSULIN 12.4 08/22/2018   INSULIN 20.3 03/05/2018   CBC    Component Value Date/Time   WBC 8.4 03/05/2018 0901   WBC 6.3 12/13/2017 1029   RBC 3.92 03/05/2018 0901   RBC 4.04 12/13/2017 1029   HGB 11.3 03/05/2018 0901   HCT 34.2 03/05/2018 0917   PLT 331 03/05/2018 0901   MCV 89 03/05/2018 0901   MCH 28.8  03/05/2018 0901   MCH 28.6 02/07/2016 1022   MCHC 32.3 03/05/2018 0901   MCHC 32.7 12/13/2017 1029   RDW 13.8 03/05/2018 0901   LYMPHSABS 1.9 03/05/2018 0901   MONOABS 0.3  02/07/2016 1022   EOSABS 0.2 03/05/2018 0901   BASOSABS 0.0 03/05/2018 0901   Iron/TIBC/Ferritin/ %Sat    Component Value Date/Time   IRON 50 03/05/2018 0917   TIBC 316 03/05/2018 0917   FERRITIN 47 03/05/2018 0917   IRONPCTSAT 16 03/05/2018 0917   Lipid Panel     Component Value Date/Time   CHOL 177 08/22/2018 1015   TRIG 109 08/22/2018 1015   HDL 55 08/22/2018 1015   CHOLHDL 3.2 08/22/2018 1015   CHOLHDL 3 12/13/2017 1029   VLDL 13.4 12/13/2017 1029   LDLCALC 100 (H) 08/22/2018 1015   Hepatic Function Panel     Component Value Date/Time   PROT 7.0 01/09/2019 1326   ALBUMIN 4.1 01/09/2019 1326   AST 19 01/09/2019 1326   ALT 19 01/09/2019 1326   ALKPHOS 49 01/09/2019 1326   BILITOT 0.3 01/09/2019 1326   BILIDIR 0.1 06/04/2014 1029   IBILI 0.3 06/04/2014 1029      Component Value Date/Time   TSH 1.900 03/05/2018 0901   TSH 1.19 12/13/2017 1029   TSH 1.00 11/07/2016 0929    Ref. Range 01/09/2019 13:26  Vitamin D, 25-Hydroxy Latest Ref Range: 30.0 - 100.0 ng/mL 37.0    OBESITY BEHAVIORAL INTERVENTION VISIT  Today's visit was #16  Starting weight: 244 lbs Starting date: 03/05/2018 Today's weight: 245 lbs  Today's date: 01/27/2019 Total lbs lost to date: 0    01/27/2019  Height 5\' 8"  (1.727 m)  Weight 245 lb (111.1 kg)  BMI (Calculated) 37.26  BLOOD PRESSURE - SYSTOLIC 330  BLOOD PRESSURE - DIASTOLIC 74   Body Fat % 07.6 %  Total Body Water (lbs) 92.2 lbs   ASK: We discussed the diagnosis of obesity with Misty Mahoney today and Misty Mahoney agreed to give [place] permission to discuss obesity behavioral modification therapy today.  ASSESS: Misty Mahoney has the diagnosis of obesity and her BMI today is 37.26. Misty Mahoney is in the action stage of change.   ADVISE: Misty Mahoney was educated on the multiple health risks of obesity as well as the benefit of weight loss to improve her health. She was advised of the need for long term treatment and the importance of lifestyle  modifications to improve her current health and to decrease her risk of future health problems.  AGREE: Multiple dietary modification options and treatment options were discussed and  Misty Mahoney agreed to follow the recommendations documented in the above note.  ARRANGE: Arantza was educated on the importance of frequent visits to treat obesity as outlined per CMS and USPSTF guidelines and agreed to schedule her next follow up appointment today.  Migdalia Dk, am acting as transcriptionist for Abby Potash, PA-C I, Abby Potash, PA-C have reviewed above note and agree with its content

## 2019-02-03 MED FILL — ESCITALOPRAM 10 MG TABLET: 10 | 90 days supply | Qty: 90 | Fill #0

## 2019-02-11 ENCOUNTER — Encounter (INDEPENDENT_AMBULATORY_CARE_PROVIDER_SITE_OTHER): Payer: Self-pay

## 2019-02-17 ENCOUNTER — Ambulatory Visit (INDEPENDENT_AMBULATORY_CARE_PROVIDER_SITE_OTHER): Payer: No Typology Code available for payment source | Admitting: Physician Assistant

## 2019-02-17 ENCOUNTER — Encounter (INDEPENDENT_AMBULATORY_CARE_PROVIDER_SITE_OTHER): Payer: Self-pay

## 2019-03-07 ENCOUNTER — Other Ambulatory Visit: Payer: Self-pay | Admitting: Internal Medicine

## 2019-03-07 MED ORDER — DILTIAZEM HCL ER COATED BEADS 180 MG PO CP24: 180.0000 mg | ORAL_CAPSULE | Freq: Two times a day (BID) | ORAL | 0 refills | Status: DC

## 2019-03-07 MED FILL — VIT D2 1.25 MG (50,000 UNIT: 1.25 MG | 84 days supply | Qty: 12 | Fill #0

## 2019-03-07 MED FILL — CARTIA XT 180 MG CAPSULE SA: 180 | 90 days supply | Qty: 180 | Fill #0

## 2019-03-07 MED FILL — IRBESARTAN 300 MG TABS: 300 | 90 days supply | Qty: 90 | Fill #1

## 2019-03-07 MED FILL — metFORMIN HCL 500 MG TABS: 500 | 90 days supply | Qty: 90 | Fill #0

## 2019-03-07 MED FILL — TRIAMTERENE/HCTZ 37.5/25 TB: 37.5-25 | 90 days supply | Qty: 90 | Fill #1

## 2019-04-24 MED FILL — ESCITALOPRAM 10 MG TABLET: 10 | 90 days supply | Qty: 90 | Fill #1

## 2019-05-21 ENCOUNTER — Other Ambulatory Visit: Payer: Self-pay | Admitting: Obstetrics and Gynecology

## 2019-05-21 DIAGNOSIS — Z1231 Encounter for screening mammogram for malignant neoplasm of breast: Secondary | ICD-10-CM

## 2019-06-13 ENCOUNTER — Ambulatory Visit
Admission: RE | Admit: 2019-06-13 | Discharge: 2019-06-13 | Disposition: A | Discharge status: Home / Self Care | Payer: No Typology Code available for payment source | Source: Ambulatory Visit

## 2019-06-13 ENCOUNTER — Other Ambulatory Visit: Payer: Self-pay

## 2019-06-13 DIAGNOSIS — Z1231 Encounter for screening mammogram for malignant neoplasm of breast: Secondary | ICD-10-CM

## 2019-06-16 ENCOUNTER — Other Ambulatory Visit (INDEPENDENT_AMBULATORY_CARE_PROVIDER_SITE_OTHER): Payer: Self-pay | Admitting: Family Medicine

## 2019-06-16 ENCOUNTER — Other Ambulatory Visit: Payer: Self-pay | Admitting: Family Medicine

## 2019-06-16 DIAGNOSIS — E559 Vitamin D deficiency, unspecified: Secondary | ICD-10-CM

## 2019-06-16 MED FILL — VIT D2 1.25 MG (50,000 UNIT: 1.25 MG | 84 days supply | Qty: 12 | Fill #0

## 2019-06-16 MED FILL — TRIAMTERENE/HCTZ 37.5/25 TB: 37.5-25 | 90 days supply | Qty: 90 | Fill #0

## 2019-06-16 MED FILL — IRBESARTAN 300 MG TABLET: 300 | 90 days supply | Qty: 90 | Fill #0

## 2019-07-17 ENCOUNTER — Encounter: Payer: Self-pay | Admitting: Family Medicine

## 2019-07-17 ENCOUNTER — Ambulatory Visit (INDEPENDENT_AMBULATORY_CARE_PROVIDER_SITE_OTHER): Payer: No Typology Code available for payment source | Admitting: Family Medicine

## 2019-07-17 ENCOUNTER — Other Ambulatory Visit: Payer: Self-pay

## 2019-07-17 VITALS — BP 122/70 | HR 66 | Temp 98.1°F | Resp 18 | Wt 247.0 lb

## 2019-07-17 DIAGNOSIS — Z23 Encounter for immunization: Secondary | ICD-10-CM | POA: Diagnosis not present

## 2019-07-17 DIAGNOSIS — E559 Vitamin D deficiency, unspecified: Secondary | ICD-10-CM

## 2019-07-17 DIAGNOSIS — D509 Iron deficiency anemia, unspecified: Secondary | ICD-10-CM

## 2019-07-17 DIAGNOSIS — E785 Hyperlipidemia, unspecified: Secondary | ICD-10-CM

## 2019-07-17 DIAGNOSIS — K625 Hemorrhage of anus and rectum: Secondary | ICD-10-CM

## 2019-07-17 DIAGNOSIS — I10 Essential (primary) hypertension: Secondary | ICD-10-CM | POA: Diagnosis not present

## 2019-07-17 DIAGNOSIS — R739 Hyperglycemia, unspecified: Secondary | ICD-10-CM

## 2019-07-17 DIAGNOSIS — Z0000 Encounter for general adult medical examination without abnormal findings: Secondary | ICD-10-CM

## 2019-07-17 DIAGNOSIS — Z6837 Body mass index (BMI) 37.0-37.9, adult: Secondary | ICD-10-CM

## 2019-07-17 LAB — CBC
HCT: 32.9 % — ABNORMAL LOW (ref 36.0–46.0)
Hemoglobin: 10.9 g/dL — ABNORMAL LOW (ref 12.0–15.0)
MCHC: 33.1 g/dL (ref 30.0–36.0)
MCV: 88.9 fl (ref 78.0–100.0)
Platelets: 265 10*3/uL (ref 150.0–400.0)
RBC: 3.7 Mil/uL — ABNORMAL LOW (ref 3.87–5.11)
RDW: 13.5 % (ref 11.5–15.5)
WBC: 6.5 10*3/uL (ref 4.0–10.5)

## 2019-07-17 LAB — COMPREHENSIVE METABOLIC PANEL
ALT: 15 U/L (ref 0–35)
AST: 16 U/L (ref 0–37)
Albumin: 4.2 g/dL (ref 3.5–5.2)
Alkaline Phosphatase: 44 U/L (ref 39–117)
BUN: 27 mg/dL — ABNORMAL HIGH (ref 6–23)
CO2: 32 mEq/L (ref 19–32)
Calcium: 9.5 mg/dL (ref 8.4–10.5)
Chloride: 103 mEq/L (ref 96–112)
Creatinine, Ser: 0.81 mg/dL (ref 0.40–1.20)
GFR: 73.66 mL/min (ref >60.00)
Glucose, Bld: 105 mg/dL — ABNORMAL HIGH (ref 70–99)
Potassium: 4.3 mEq/L (ref 3.5–5.1)
Sodium: 143 mEq/L (ref 135–145)
Total Bilirubin: 0.4 mg/dL (ref 0.2–1.2)
Total Protein: 7.1 g/dL (ref 6.0–8.3)

## 2019-07-17 LAB — LIPID PANEL
Cholesterol: 175 mg/dL (ref 0–200)
HDL: 57.9 mg/dL (ref >39.00)
LDL Cholesterol: 99 mg/dL (ref 0–99)
NonHDL: 117.55
Total CHOL/HDL Ratio: 3
Triglycerides: 93 mg/dL (ref 0.0–149.0)
VLDL: 18.6 mg/dL (ref 0.0–40.0)

## 2019-07-17 LAB — HEMOGLOBIN A1C: Hgb A1c MFr Bld: 6.2 % (ref 4.6–6.5)

## 2019-07-17 LAB — VITAMIN D 25 HYDROXY (VIT D DEFICIENCY, FRACTURES): VITD: 39.77 ng/mL (ref 30.00–100.00)

## 2019-07-17 LAB — TSH: TSH: 1.39 u[IU]/mL (ref 0.35–4.50)

## 2019-07-17 NOTE — Patient Instructions (Addendum)
Pulse oximeter Blood Pressure, Omron, upper arm  Check vitals weekly  Multivitamin with Selenium and zinc Vitamin 1000 mg daily  Shingrix is the new shingles shot.     Preventive Care 9-54 Years Old, Female Preventive care refers to visits with your health care provider and lifestyle choices that can promote health and wellness. This includes:  A yearly physical exam. This may also be called an annual well check.  Regular dental visits and eye exams.  Immunizations.  Screening for certain conditions.  Healthy lifestyle choices, such as eating a healthy diet, getting regular exercise, not using drugs or products that contain nicotine and tobacco, and limiting alcohol use. What can I expect for my preventive care visit? Physical exam Your health care provider will check your:  Height and weight. This may be used to calculate body mass index (BMI), which tells if you are at a healthy weight.  Heart rate and blood pressure.  Skin for abnormal spots. Counseling Your health care provider may ask you questions about your:  Alcohol, tobacco, and drug use.  Emotional well-being.  Home and relationship well-being.  Sexual activity.  Eating habits.  Work and work Statistician.  Method of birth control.  Menstrual cycle.  Pregnancy history. What immunizations do I need?  Influenza (flu) vaccine  This is recommended every year. Tetanus, diphtheria, and pertussis (Tdap) vaccine  You may need a Td booster every 10 years. Varicella (chickenpox) vaccine  You may need this if you have not been vaccinated. Zoster (shingles) vaccine  You may need this after age 26. Measles, mumps, and rubella (MMR) vaccine  You may need at least one dose of MMR if you were born in 1957 or later. You may also need a second dose. Pneumococcal conjugate (PCV13) vaccine  You may need this if you have certain conditions and were not previously vaccinated. Pneumococcal polysaccharide  (PPSV23) vaccine  You may need one or two doses if you smoke cigarettes or if you have certain conditions. Meningococcal conjugate (MenACWY) vaccine  You may need this if you have certain conditions. Hepatitis A vaccine  You may need this if you have certain conditions or if you travel or work in places where you may be exposed to hepatitis A. Hepatitis B vaccine  You may need this if you have certain conditions or if you travel or work in places where you may be exposed to hepatitis B. Haemophilus influenzae type b (Hib) vaccine  You may need this if you have certain conditions. Human papillomavirus (HPV) vaccine  If recommended by your health care provider, you may need three doses over 6 months. You may receive vaccines as individual doses or as more than one vaccine together in one shot (combination vaccines). Talk with your health care provider about the risks and benefits of combination vaccines. What tests do I need? Blood tests  Lipid and cholesterol levels. These may be checked every 5 years, or more frequently if you are over 61 years old.  Hepatitis C test.  Hepatitis B test. Screening  Lung cancer screening. You may have this screening every year starting at age 62 if you have a 30-pack-year history of smoking and currently smoke or have quit within the past 15 years.  Colorectal cancer screening. All adults should have this screening starting at age 62 and continuing until age 86. Your health care provider may recommend screening at age 65 if you are at increased risk. You will have tests every 1-10 years, depending on your  results and the type of screening test.  Diabetes screening. This is done by checking your blood sugar (glucose) after you have not eaten for a while (fasting). You may have this done every 1-3 years.  Mammogram. This may be done every 1-2 years. Talk with your health care provider about when you should start having regular mammograms. This may  depend on whether you have a family history of breast cancer.  BRCA-related cancer screening. This may be done if you have a family history of breast, ovarian, tubal, or peritoneal cancers.  Pelvic exam and Pap test. This may be done every 3 years starting at age 45. Starting at age 79, this may be done every 5 years if you have a Pap test in combination with an HPV test. Other tests  Sexually transmitted disease (STD) testing.  Bone density scan. This is done to screen for osteoporosis. You may have this scan if you are at high risk for osteoporosis. Follow these instructions at home: Eating and drinking  Eat a diet that includes fresh fruits and vegetables, whole grains, lean protein, and low-fat dairy.  Take vitamin and mineral supplements as recommended by your health care provider.  Do not drink alcohol if: ? Your health care provider tells you not to drink. ? You are pregnant, may be pregnant, or are planning to become pregnant.  If you drink alcohol: ? Limit how much you have to 0-1 drink a day. ? Be aware of how much alcohol is in your drink. In the [place] one drink equals one 12 oz bottle of beer (355 mL), one 5 oz glass of wine (148 mL), or one 1 oz glass of hard liquor (44 mL). Lifestyle  Take daily care of your teeth and gums.  Stay active. Exercise for at least 30 minutes on 5 or more days each week.  Do not use any products that contain nicotine or tobacco, such as cigarettes, e-cigarettes, and chewing tobacco. If you need help quitting, ask your health care provider.  If you are sexually active, practice safe sex. Use a condom or other form of birth control (contraception) in order to prevent pregnancy and STIs (sexually transmitted infections).  If told by your health care provider, take low-dose aspirin daily starting at age 58. What's next?  Visit your health care provider once a year for a well check visit.  Ask your health care provider how often you should  have your eyes and teeth checked.  Stay up to date on all vaccines. This information is not intended to replace advice given to you by your health care provider. Make sure you discuss any questions you have with your health care provider. Document Released: 12/03/2015 Document Revised: 07/18/2018 Document Reviewed: 07/18/2018 Elsevier Patient Education  2020 [employer].

## 2019-07-17 NOTE — Assessment & Plan Note (Signed)
hgba1c acceptable, minimize simple carbs. Increase exercise as tolerated.  

## 2019-07-17 NOTE — Assessment & Plan Note (Signed)
Encouraged DASH diet, decrease po intake and increase exercise as tolerated. Needs 7-8 hours of sleep nightly. Avoid trans fats, eat small, frequent meals every 4-5 hours with lean proteins, complex carbs and healthy fats. Minimize simple carbs, GMO foods. 

## 2019-07-17 NOTE — Assessment & Plan Note (Signed)
Supplement and monitor 

## 2019-07-17 NOTE — Assessment & Plan Note (Signed)
hgba1c acceptable, minimize simple carbs. Increase exercise as tolerated. Continue current meds 

## 2019-07-17 NOTE — Assessment & Plan Note (Signed)
Encouraged heart healthy diet, increase exercise, avoid trans fats 

## 2019-07-17 NOTE — Progress Notes (Signed)
Subjective:    Patient ID: Misty Mahoney, female    DOB: 08/03/1966, 53 y.o.   MRN: [id number]  No chief complaint on file.   HPI Patient is in today for annual preventative and follow up on chronic medical concerns including hypertension, anemia, hyperglycemia and more. No recent febrile illness or hospitalizations. She is trying to maintain a heart healthy diet and is staying active. No complaints of polyuria or polydipsia. Denies CP/palp/SOB/HA/congestion/fevers/GI or GU c/o. Taking meds as prescribed. Is tolerating Diltiazem and maxzide. Is doing well on Lexapro. Continues to work at the hospital.   Past Medical History:  Diagnosis Date  . Anemia    gestational  . Atrial fibrillation ([place])   . Chicken pox as a child  . Depression 06/15/2015  . DM, gestational, diet controlled 04/10/2014   insulin resistant- A1C5.7   . Fatigue   . GERD (gastroesophageal reflux disease)    undx'd GERD   . H/O gestational diabetes mellitus, not currently pregnant 04/10/2014  . Heart murmur   . History of chicken pox   . Hyperlipidemia 12/20/2015  . Hypertension   . Joint pain   . Obesity, unspecified 04/10/2014  . OSA (obstructive sleep apnea)   . Osteoarthritis   . Palpitations   . Prediabetes   . Preventative health care 12/26/2015  . Sleep apnea    wears cpap   . SVT (supraventricular tachycardia) ([place])    s/p RFCA 09/26/12  . Vitamin D deficiency 12/26/2015    Past Surgical History:  Procedure Laterality Date  . ADENOIDECTOMY    . CARDIAC ELECTROPHYSIOLOGY STUDY AND ABLATION  2013  . ENDOVENOUS ABLATION SAPHENOUS VEIN W/ LASER Bilateral    Thermal ablation  . KNEE ARTHROSCOPY WITH MENISCAL REPAIR Left 08/25/2016   Procedure: KNEE ARTHROSCOPY WITH partial medial meniscectomy, chondroplasty;  Surgeon: Melrose Nakayama, MD;  Location: [facility];  Service: Orthopedics;  Laterality: Left;  KNEE ARTHROSCOPY WITH partial medial meniscectomy, chondroplasty  . PALATE / UVULA  BIOPSY / EXCISION    . TONSILLECTOMY  53 yrs old  . V-TACH ABLATION N/A 09/26/2012   Procedure: V-TACH ABLATION;  Surgeon: Evans Lance, MD;  Location: [facility] CATH LAB;  Service: Cardiovascular;  Laterality: N/A;  . WISDOM TOOTH EXTRACTION  53 yrs old    Family History  Problem Relation Age of Onset  . Hypertension Mother   . Dementia Mother   . Stroke Mother   . Dementia Father   . Hypertension Father   . Cataracts Father        bilateral  . Alzheimer's disease Father   . Colon polyps Father   . Cancer Sister 58       metastatic  . Breast cancer Sister 24  . Hypertension Brother   . ADD / ADHD Daughter   . ADD / ADHD Son   . Heart attack Maternal Grandfather        pacemaker  . Cancer Maternal Grandfather        ?  Marland Kitchen Cancer Paternal Grandmother        colon  . Colon cancer Maternal Grandmother   . Esophageal cancer Neg Hx   . Rectal cancer Neg Hx   . Stomach cancer Neg Hx     Social History   Socioeconomic History  . Marital status: Significant Other    Spouse name: [name]  . Number of children: 2  . Years of education: Not on file  . Highest education level: Not on  file  Occupational History  . Occupation: Therapist, sports  Social Needs  . Financial resource strain: Not on file  . Food insecurity    Worry: Not on file    Inability: Not on file  . Transportation needs    Medical: Not on file    Non-medical: Not on file  Tobacco Use  . Smoking status: Never Smoker  . Smokeless tobacco: Never Used  Substance and Sexual Activity  . Alcohol use: Yes    Comment: two or three times a year  . Drug use: No  . Sexual activity: Yes    Partners: Male    Comment: lives with kids, significant other and step daughter. minimizing dairy and gluten, exercise  Lifestyle  . Physical activity    Days per week: Not on file    Minutes per session: Not on file  . Stress: Not on file  Relationships  . Social Herbalist on phone: Not on file    Gets together: Not on file     Attends religious service: Not on file    Active member of club or organization: Not on file    Attends meetings of clubs or organizations: Not on file    Relationship status: Not on file  . Intimate partner violence    Fear of current or ex partner: Not on file    Emotionally abused: Not on file    Physically abused: Not on file    Forced sexual activity: Not on file  Other Topics Concern  . Not on file  Social History Narrative  . Not on file    Outpatient Medications Prior to Visit  Medication Sig Dispense Refill  . aspirin EC 81 MG tablet Take 1 tablet (81 mg total) by mouth daily. 30 tablet 0  . Cholecalciferol (VITAMIN D) 2000 units CAPS Take 1 capsule by mouth daily.    Marland Kitchen diltiazem (CARDIZEM CD) 180 MG 24 hr capsule Take 1 capsule (180 mg total) by mouth 2 (two) times daily. Please make overdue appt with Dr. Caryl Comes before anymore refills. 1st attempt 180 capsule 0  . escitalopram (LEXAPRO) 10 MG tablet Take 1 tablet (10 mg total) by mouth daily. 90 tablet 1  . flecainide (TAMBOCOR) 150 MG tablet Take 2 tablets (300 mg total) by mouth as needed. 18 tablet 1  . irbesartan (AVAPRO) 300 MG tablet TAKE 1 TABLET BY MOUTH DAILY. 90 tablet 1  . Magnesium 250 MG TABS Take 1 tablet by mouth daily.    . metFORMIN (GLUCOPHAGE) 500 MG tablet Take 1 tablet (500 mg total) by mouth 2 (two) times daily with a meal. 180 tablet 1  . Omega-3 Fatty Acids (FISH OIL PO) Take 1 capsule by mouth daily.    Marland Kitchen scopolamine (TRANSDERM-SCOP, 1.5 MG,) 1 MG/3DAYS Place 1 patch (1.5 mg total) onto the skin every 3 (three) days. 3 patch 0  . triamterene-hydrochlorothiazide (MAXZIDE-25) 37.5-25 MG tablet TAKE 1 TABLET BY MOUTH DAILY. 90 tablet 1  . Vitamin D, Ergocalciferol, (DRISDOL) 1.25 MG (50000 UT) CAPS capsule TAKE 1 CAPSULE (50,000 UNITS TOTAL) BY MOUTH ONCE A WEEK. 12 capsule 1  . Vitamin D, Ergocalciferol, (DRISDOL) 1.25 MG (50000 UT) CAPS capsule Take 1 capsule (50,000 Units total) by mouth every 7  (seven) days. 4 capsule 0   No facility-administered medications prior to visit.     Allergies  Allergen Reactions  . [place] Oil Swelling    Seeds     Review of Systems  Constitutional:  Negative for chills, fever and malaise/fatigue.  HENT: Negative for congestion and hearing loss.   Eyes: Negative for pain and discharge.  Respiratory: Negative for cough, sputum production and shortness of breath.   Cardiovascular: Negative for chest pain, palpitations and leg swelling.  Gastrointestinal: Negative for abdominal pain, blood in stool, constipation, diarrhea, heartburn, nausea and vomiting.  Genitourinary: Negative for dysuria, frequency, hematuria and urgency.  Musculoskeletal: Negative for back pain, falls and myalgias.  Skin: Negative for rash.  Neurological: Negative for dizziness, sensory change, loss of consciousness, weakness and headaches.  Endo/Heme/Allergies: Negative for environmental allergies. Does not bruise/bleed easily.  Psychiatric/Behavioral: Negative for depression and suicidal ideas. The patient is not nervous/anxious and does not have insomnia.        Objective:    Physical Exam Constitutional:      General: She is not in acute distress.    Appearance: She is well-developed.  HENT:     Head: Normocephalic and atraumatic.  Eyes:     Conjunctiva/sclera: Conjunctivae normal.  Neck:     Musculoskeletal: Neck supple.     Thyroid: No thyromegaly.  Cardiovascular:     Rate and Rhythm: Normal rate and regular rhythm.     Heart sounds: Normal heart sounds. No murmur.  Pulmonary:     Effort: Pulmonary effort is normal. No respiratory distress.     Breath sounds: Normal breath sounds.  Abdominal:     General: Bowel sounds are normal. There is no distension.     Palpations: Abdomen is soft. There is no mass.     Tenderness: There is no abdominal tenderness.  Lymphadenopathy:     Cervical: No cervical adenopathy.  Skin:    General: Skin is warm and dry.   Neurological:     Mental Status: She is alert and oriented to person, place, and time.  Psychiatric:        Behavior: Behavior normal.     BP 122/70 (BP Location: Left Arm, Patient Position: Sitting, Cuff Size: Normal)   Pulse 66   Temp 98.1 F (36.7 C) (Oral)   Resp 18   Wt 247 lb (112 kg)   LMP 06/24/2018   SpO2 98%   BMI 37.56 kg/m  Wt Readings from Last 3 Encounters:  07/17/19 247 lb (112 kg)  01/27/19 245 lb (111.1 kg)  01/17/19 252 lb 3.2 oz (114.4 kg)    Diabetic Foot Exam - Simple   No data filed     Lab Results  Component Value Date   WBC 8.4 03/05/2018   HGB 11.3 03/05/2018   HCT 34.2 03/05/2018   PLT 331 03/05/2018   GLUCOSE 83 01/09/2019   CHOL 177 08/22/2018   TRIG 109 08/22/2018   HDL 55 08/22/2018   LDLCALC 100 (H) 08/22/2018   ALT 19 01/09/2019   AST 19 01/09/2019   NA 143 01/09/2019   K 4.0 01/09/2019   CL 102 01/09/2019   CREATININE 0.65 01/09/2019   BUN 21 01/09/2019   CO2 22 01/09/2019   TSH 1.900 03/05/2018   HGBA1C 5.7 (H) 01/09/2019    Lab Results  Component Value Date   TSH 1.900 03/05/2018   Lab Results  Component Value Date   WBC 8.4 03/05/2018   HGB 11.3 03/05/2018   HCT 34.2 03/05/2018   MCV 89 03/05/2018   PLT 331 03/05/2018   Lab Results  Component Value Date   NA 143 01/09/2019   K 4.0 01/09/2019   CO2 22 01/09/2019   GLUCOSE  83 01/09/2019   BUN 21 01/09/2019   CREATININE 0.65 01/09/2019   BILITOT 0.3 01/09/2019   ALKPHOS 49 01/09/2019   AST 19 01/09/2019   ALT 19 01/09/2019   PROT 7.0 01/09/2019   ALBUMIN 4.1 01/09/2019   CALCIUM 9.4 01/09/2019   ANIONGAP 12 08/21/2016   GFR 81.02 02/05/2018   Lab Results  Component Value Date   CHOL 177 08/22/2018   Lab Results  Component Value Date   HDL 55 08/22/2018   Lab Results  Component Value Date   LDLCALC 100 (H) 08/22/2018   Lab Results  Component Value Date   TRIG 109 08/22/2018   Lab Results  Component Value Date   CHOLHDL 3.2 08/22/2018    Lab Results  Component Value Date   HGBA1C 5.7 (H) 01/09/2019       Assessment & Plan:   Problem List Items Addressed This Visit    Hypertension    hgba1c acceptable, minimize simple carbs. Increase exercise as tolerated. Continue current meds      Relevant Orders   CBC   Comprehensive metabolic panel   TSH   Obesity    Encouraged DASH diet, decrease po intake and increase exercise as tolerated. Needs 7-8 hours of sleep nightly. Avoid trans fats, eat small, frequent meals every 4-5 hours with lean proteins, complex carbs and healthy fats. Minimize simple carbs, GMO foods.      Relevant Orders   Hemoglobin A1c   Hyperlipidemia    Encouraged heart healthy diet, increase exercise, avoid trans fats      Relevant Orders   Lipid panel   Hyperglycemia    hgba1c acceptable, minimize simple carbs. Increase exercise as tolerated.       Preventative health care    Patient encouraged to maintain heart healthy diet, regular exercise, adequate sleep. Consider daily probiotics. Take medications as prescribed      Vitamin D deficiency    Supplement and monitor      Relevant Orders   VITAMIN D 25 Hydroxy (Vit-D Deficiency, Fractures)    Other Visit Diagnoses    Needs flu shot    -  Primary   Relevant Orders   Flu Vaccine QUAD 6+ mos PF IM (Fluarix Quad PF) (Completed)      I am having Sanaz F. Chrissie Noa maintain her Vitamin D, Omega-3 Fatty Acids (FISH OIL PO), flecainide, Magnesium, aspirin EC, escitalopram, metFORMIN, scopolamine, diltiazem, Vitamin D (Ergocalciferol), triamterene-hydrochlorothiazide, and irbesartan.  No orders of the defined types were placed in this encounter.    Penni Homans, MD

## 2019-07-17 NOTE — Assessment & Plan Note (Signed)
Patient encouraged to maintain heart healthy diet, regular exercise, adequate sleep. Consider daily probiotics. Take medications as prescribed 

## 2019-07-18 LAB — INSULIN, RANDOM: Insulin: 10.4 u[IU]/mL

## 2019-07-20 NOTE — Assessment & Plan Note (Signed)
Increase leafy greens, consider increased lean red meat and using cast iron cookware. Continue to monitor, report any concerns. Has noted some occasional blood in BM. Will check stool tests and she will report worsening.

## 2019-07-21 ENCOUNTER — Encounter: Payer: Self-pay | Admitting: Family Medicine

## 2019-07-22 ENCOUNTER — Other Ambulatory Visit: Payer: Self-pay | Admitting: Family Medicine

## 2019-07-22 DIAGNOSIS — N289 Disorder of kidney and ureter, unspecified: Secondary | ICD-10-CM

## 2019-07-23 ENCOUNTER — Other Ambulatory Visit: Payer: Self-pay | Admitting: Family Medicine

## 2019-07-23 ENCOUNTER — Encounter: Payer: Self-pay | Admitting: Family Medicine

## 2019-07-23 MED ORDER — HYDROCHLOROTHIAZIDE 25 MG PO TABS: 25.0000 mg | ORAL_TABLET | Freq: Every day | ORAL | 1 refills | Status: DC

## 2019-07-23 MED FILL — HYDROCHLOROTHIAZIDE 25 MG T: 25 | 90 days supply | Qty: 90 | Fill #0

## 2019-07-25 ENCOUNTER — Other Ambulatory Visit: Payer: Self-pay | Admitting: Family Medicine

## 2019-07-25 MED FILL — ESCITALOPRAM 10 MG TABLET: 10 | 90 days supply | Qty: 90 | Fill #0

## 2019-08-04 NOTE — Telephone Encounter (Signed)
Pt is stating that she is having trouble in her my chart and wants Dr B nurse to give her a call back and specfic is concerned wanting to know about iron supplements

## 2019-08-05 ENCOUNTER — Other Ambulatory Visit: Payer: Self-pay

## 2019-08-05 ENCOUNTER — Ambulatory Visit (HOSPITAL_BASED_OUTPATIENT_CLINIC_OR_DEPARTMENT_OTHER)
Admission: RE | Admit: 2019-08-05 | Discharge: 2019-08-05 | Disposition: A | Discharge status: Home / Self Care | Payer: No Typology Code available for payment source | Source: Ambulatory Visit | Attending: Family Medicine | Admitting: Family Medicine

## 2019-08-05 DIAGNOSIS — N289 Disorder of kidney and ureter, unspecified: Secondary | ICD-10-CM | POA: Insufficient documentation

## 2019-08-12 ENCOUNTER — Other Ambulatory Visit (INDEPENDENT_AMBULATORY_CARE_PROVIDER_SITE_OTHER): Payer: No Typology Code available for payment source

## 2019-08-12 ENCOUNTER — Encounter: Payer: Self-pay | Admitting: Family Medicine

## 2019-08-12 DIAGNOSIS — K625 Hemorrhage of anus and rectum: Secondary | ICD-10-CM | POA: Diagnosis not present

## 2019-08-12 LAB — FECAL OCCULT BLOOD, IMMUNOCHEMICAL: Fecal Occult Bld: NEGATIVE

## 2019-08-13 ENCOUNTER — Other Ambulatory Visit: Payer: Self-pay | Admitting: Family Medicine

## 2019-08-13 MED ORDER — FERROUS FUMARATE 324 (106 FE) MG PO TABS: 1.0000 | ORAL_TABLET | Freq: Every day | ORAL | 3 refills | Status: DC

## 2019-08-14 MED FILL — FERROCITE TABLET: 324 | 100 days supply | Qty: 100 | Fill #0

## 2019-08-15 ENCOUNTER — Encounter: Payer: Self-pay | Admitting: Family Medicine

## 2019-08-15 DIAGNOSIS — D509 Iron deficiency anemia, unspecified: Secondary | ICD-10-CM

## 2019-09-13 ENCOUNTER — Other Ambulatory Visit: Payer: Self-pay | Admitting: Family Medicine

## 2019-09-13 DIAGNOSIS — R7303 Prediabetes: Secondary | ICD-10-CM

## 2019-09-13 MED FILL — VIT D2 1.25 MG (50,000 UNIT: 1.25 MG | 84 days supply | Qty: 12 | Fill #1

## 2019-09-13 MED FILL — IRBESARTAN 300 MG TABS: 300 | 90 days supply | Qty: 90 | Fill #1

## 2019-09-15 ENCOUNTER — Other Ambulatory Visit: Payer: No Typology Code available for payment source

## 2019-09-15 MED FILL — metFORMIN HCL 500 MG TABS: 500 | 90 days supply | Qty: 180 | Fill #0

## 2019-09-18 MED FILL — FERROCITE TABLET: 324 | 100 days supply | Qty: 100 | Fill #0

## 2019-09-25 ENCOUNTER — Other Ambulatory Visit: Payer: Self-pay

## 2019-09-25 DIAGNOSIS — R1310 Dysphagia, unspecified: Secondary | ICD-10-CM

## 2019-09-25 DIAGNOSIS — K219 Gastro-esophageal reflux disease without esophagitis: Secondary | ICD-10-CM

## 2019-09-26 ENCOUNTER — Other Ambulatory Visit: Payer: No Typology Code available for payment source

## 2019-10-07 ENCOUNTER — Other Ambulatory Visit: Payer: Self-pay | Admitting: Family Medicine

## 2019-10-07 ENCOUNTER — Encounter: Payer: Self-pay | Admitting: Family Medicine

## 2019-10-07 DIAGNOSIS — R1310 Dysphagia, unspecified: Secondary | ICD-10-CM

## 2019-10-07 NOTE — Progress Notes (Unsigned)
amb ref °

## 2019-10-09 ENCOUNTER — Other Ambulatory Visit: Payer: Self-pay

## 2019-10-09 ENCOUNTER — Ambulatory Visit (AMBULATORY_SURGERY_CENTER): Payer: No Typology Code available for payment source | Admitting: *Deleted

## 2019-10-09 VITALS — Ht 68.0 in | Wt 251.0 lb

## 2019-10-09 DIAGNOSIS — Z1159 Encounter for screening for other viral diseases: Secondary | ICD-10-CM

## 2019-10-09 DIAGNOSIS — K219 Gastro-esophageal reflux disease without esophagitis: Secondary | ICD-10-CM

## 2019-10-09 DIAGNOSIS — R1310 Dysphagia, unspecified: Secondary | ICD-10-CM

## 2019-10-09 NOTE — Progress Notes (Signed)
Patient is here in-person for PV. Patient denies any allergies to eggs or soy. Patient denies any problems with anesthesia/sedation. Patient denies any oxygen use at home. Patient denies taking any diet/weight loss medications or blood thinners. Patient is not being treated for MRSA or C-diff. EMMI education declined by the pt. COVID-19 screening test is on 10/21/2019, the pt is aware. Pt is aware that care partner will wait in the car during procedure; if they feel like they will be too hot or cold to wait in the car; they may wait in the 4 th floor lobby. Patient is aware to bring only one care partner. We want them to wear a mask (we do not have any that we can provide them), practice social distancing, and we will check their temperatures when they get here.  I did remind the patient that their care partner needs to stay in the parking lot the entire time and have a cell phone available, we will call them when the pt is ready for discharge. Patient will wear mask into building.

## 2019-10-10 ENCOUNTER — Encounter: Payer: Self-pay | Admitting: Internal Medicine

## 2019-10-21 ENCOUNTER — Other Ambulatory Visit: Payer: Self-pay | Admitting: Internal Medicine

## 2019-10-21 ENCOUNTER — Ambulatory Visit (INDEPENDENT_AMBULATORY_CARE_PROVIDER_SITE_OTHER): Payer: No Typology Code available for payment source

## 2019-10-21 DIAGNOSIS — Z1159 Encounter for screening for other viral diseases: Secondary | ICD-10-CM

## 2019-10-22 LAB — SARS CORONAVIRUS 2 (TAT 6-24 HRS): SARS Coronavirus 2: NEGATIVE

## 2019-10-23 ENCOUNTER — Other Ambulatory Visit: Payer: Self-pay

## 2019-10-23 ENCOUNTER — Encounter: Payer: Self-pay | Admitting: Internal Medicine

## 2019-10-23 ENCOUNTER — Ambulatory Visit (AMBULATORY_SURGERY_CENTER): Payer: No Typology Code available for payment source | Admitting: Internal Medicine

## 2019-10-23 ENCOUNTER — Other Ambulatory Visit: Payer: Self-pay | Admitting: Internal Medicine

## 2019-10-23 VITALS — BP 118/79 | HR 73 | Temp 98.5°F | Resp 23 | Ht 68.0 in | Wt 251.0 lb

## 2019-10-23 DIAGNOSIS — K219 Gastro-esophageal reflux disease without esophagitis: Secondary | ICD-10-CM

## 2019-10-23 DIAGNOSIS — K449 Diaphragmatic hernia without obstruction or gangrene: Secondary | ICD-10-CM | POA: Diagnosis not present

## 2019-10-23 DIAGNOSIS — R1310 Dysphagia, unspecified: Secondary | ICD-10-CM

## 2019-10-23 DIAGNOSIS — K222 Esophageal obstruction: Secondary | ICD-10-CM | POA: Diagnosis not present

## 2019-10-23 DIAGNOSIS — D500 Iron deficiency anemia secondary to blood loss (chronic): Secondary | ICD-10-CM

## 2019-10-23 MED ORDER — PANTOPRAZOLE SODIUM 40 MG PO TBEC: 40.0000 mg | DELAYED_RELEASE_TABLET | Freq: Every day | ORAL | 11 refills | Status: DC

## 2019-10-23 MED ORDER — SODIUM CHLORIDE 0.9 % IV SOLN: 500.0000 mL | Freq: Once | INTRAVENOUS | Status: DC

## 2019-10-23 MED ORDER — DILTIAZEM HCL ER COATED BEADS 180 MG PO CP24: 180.0000 mg | ORAL_CAPSULE | Freq: Two times a day (BID) | ORAL | 0 refills | Status: DC

## 2019-10-23 MED FILL — PANTOPRAZOLE SOD DR 40 MG T: 40 | 30 days supply | Qty: 30 | Fill #0

## 2019-10-23 MED FILL — HYDROCHLOROTHIAZIDE 25 MG T: 25 | 90 days supply | Qty: 90 | Fill #1

## 2019-10-23 MED FILL — DILTIAZEM HCL ER COATED BEA: 180 | 30 days supply | Qty: 30 | Fill #0

## 2019-10-23 MED FILL — ESCITALOPRAM 10 MG TABLET: 10 | 90 days supply | Qty: 90 | Fill #1

## 2019-10-23 NOTE — Progress Notes (Signed)
PT taken to PACU. Monitors in place. VSS. Report given to RN. 

## 2019-10-23 NOTE — Progress Notes (Signed)
Temp check by:JB Vital check by:CW  The medical and surgical history was reviewed and verified with the patient. 

## 2019-10-23 NOTE — Patient Instructions (Signed)
YOU HAD AN ENDOSCOPIC PROCEDURE TODAY AT THE [facility]:   Refer to the procedure report that was given to you for any specific questions about what was found during the examination.  If the procedure report does not answer your questions, please call your gastroenterologist to clarify.  If you requested that your care partner not be given the details of your procedure findings, then the procedure report has been included in a sealed envelope for you to review at your convenience later.  YOU SHOULD EXPECT: Some feelings of bloating in the abdomen. Passage of more gas than usual.  Walking can help get rid of the air that was put into your GI tract during the procedure and reduce the bloating. If you had a lower endoscopy (such as a colonoscopy or flexible sigmoidoscopy) you may notice spotting of blood in your stool or on the toilet paper. If you underwent a bowel prep for your procedure, you may not have a normal bowel movement for a few days.  Please Note:  You might notice some irritation and congestion in your nose or some drainage.  This is from the oxygen used during your procedure.  There is no need for concern and it should clear up in a day or so.  SYMPTOMS TO REPORT IMMEDIATELY:   Following upper endoscopy (EGD)  Vomiting of blood or coffee ground material  New chest pain or pain under the shoulder blades  Painful or persistently difficult swallowing  New shortness of breath  Fever of 100F or higher  Black, tarry-looking stools  For urgent or emergent issues, a gastroenterologist can be reached at any hour by calling [phone].   DIET:  We do recommend a small meal at first, but then you may proceed to your regular diet.  Drink plenty of fluids but you should avoid alcoholic beverages for 24 hours.  ACTIVITY:  You should plan to take it easy for the rest of today and you should NOT DRIVE or use heavy machinery until tomorrow (because of the sedation medicines used  during the test).    FOLLOW UP: Our staff will call the number listed on your records 48-72 hours following your procedure to check on you and address any questions or concerns that you may have regarding the information given to you following your procedure. If we do not reach you, we will leave a message.  We will attempt to reach you two times.  During this call, we will ask if you have developed any symptoms of COVID 19. If you develop any symptoms (ie: fever, flu-like symptoms, shortness of breath, cough etc.) before then, please call [phone].  If you test positive for Covid 19 in the 2 weeks post procedure, please call and report this information to us.    If any biopsies were taken you will be contacted by phone or by letter within the next 1-3 weeks.  Please call us at [phone] if you have not heard about the biopsies in 3 weeks.    SIGNATURES/CONFIDENTIALITY: You and/or your care partner have signed paperwork which will be entered into your electronic medical record.  These signatures attest to the fact that that the information above on your After Visit Summary has been reviewed and is understood.  Full responsibility of the confidentiality of this discharge information lies with you and/or your care-partner. 

## 2019-10-23 NOTE — Op Note (Signed)
[place] Patient Name: Misty Mahoney Procedure Date: 10/23/2019 2:41 PM MRN: [id number] Endoscopist: Docia Chuck. Henrene Pastor , MD Age: 53 Referring MD:  Date of Birth: 1966-06-13 Gender: Female Account #: [id number] Procedure:                Upper GI endoscopy with biopsy; with [facility]                            dilation of the esophagus 41F Indications:              Dysphagia, Iron deficiency anemia, Esophageal reflux Medicines:                Monitored Anesthesia Care Procedure:                Pre-Anesthesia Assessment:                           - Prior to the procedure, a History and Physical                            was performed, and patient medications and                            allergies were reviewed. The patient's tolerance of                            previous anesthesia was also reviewed. The risks                            and benefits of the procedure and the sedation                            options and risks were discussed with the patient.                            All questions were answered, and informed consent                            was obtained. Prior Anticoagulants: The patient has                            taken no previous anticoagulant or antiplatelet                            agents. ASA Grade Assessment: II - A patient with                            mild systemic disease. After reviewing the risks                            and benefits, the patient was deemed in                            satisfactory condition to undergo the procedure.  After obtaining informed consent, the endoscope was                            passed under direct vision. Throughout the                            procedure, the patient's blood pressure, pulse, and                            oxygen saturations were monitored continuously. The                            Endoscope was introduced through the mouth, and   advanced to the second part of duodenum. The upper                            GI endoscopy was accomplished without difficulty.                            The patient tolerated the procedure well. Scope In: Scope Out: Findings:                 One benign-appearing, intrinsic moderate stenosis                            was found 35 cm from the incisors. This stenosis                            measured 1.4 cm (inner diameter). The scope was                            withdrawn after completing the endoscopic survey.                            Dilation was performed with a Maloney dilator with                            no resistance at 28 Fr.                           The exam of the esophagus was also remarkable for                            moderate esophagitis with friability and edema.                           The stomach was normal except for a small sliding                            hiatal hernia. CLO biopsy was obtained.                           The examined duodenum was normal. Biopsies for  histology were taken with a cold forceps for                            evaluation of celiac disease.                           The cardia and gastric fundus were normal on                            retroflexion. Complications:            No immediate complications. Estimated Blood Loss:     Estimated blood loss: none. Impression:               1. GERD complicated by erosive esophagitis and                            peptic stricture                           2. Status post esophageal dilation. 30 French                            Maloney                           3. Iron deficiency anemia status post duodenal                            biopsies                           4. Status post CLO biopsy. Recommendation:           1. Post dilation diet                           2. Prescribe pantoprazole 40 mg DAILY; #30; 11                            refills                            3. Follow-up biopsies and CLO test                           4. Office follow-up with Dr. Henrene Pastor in 6 to 8 weeks. Docia Chuck. Henrene Pastor, MD 10/23/2019 3:07:05 PM This report has been signed electronically.

## 2019-10-23 NOTE — Progress Notes (Signed)
Called to room to assist during endoscopic procedure.  Patient ID and intended procedure confirmed with present staff. Received instructions for my participation in the procedure from the performing physician.  

## 2019-10-24 LAB — HELICOBACTER PYLORI SCREEN-BIOPSY: UREASE: NEGATIVE

## 2019-10-27 ENCOUNTER — Telehealth: Payer: Self-pay

## 2019-10-27 NOTE — Telephone Encounter (Signed)
  Follow up Call-  Call back number 10/23/2019 07/30/2018  Post procedure Call Back phone  # [phone] work number and at work.  Permission to leave phone message Yes No  Some recent data might be hidden     Patient questions:  Do you have a fever, pain , or abdominal swelling? No. Pain Score  0 *  Have you tolerated food without any problems? Yes.    Have you been able to return to your normal activities? Yes.    Do you have any questions about your discharge instructions: Diet   No. Medications  No. Follow up visit  No.  Do you have questions or concerns about your Care? No.  Actions: * If pain score is 4 or above: No action needed, pain <4.   1. Have you developed a fever since your procedure? no  2.   Have you had an respiratory symptoms (SOB or cough) since your procedure? no  3.   Have you tested positive for COVID 19 since your procedure no  4.   Have you had any family members/close contacts diagnosed with the COVID 19 since your procedure?  no   If yes to any of these questions please route to Joylene John, RN and Alphonsa Gin, Therapist, sports.

## 2019-10-28 ENCOUNTER — Encounter: Payer: Self-pay | Admitting: Internal Medicine

## 2019-11-23 ENCOUNTER — Encounter: Payer: Self-pay | Admitting: Family Medicine

## 2019-11-24 ENCOUNTER — Other Ambulatory Visit: Payer: Self-pay | Admitting: Internal Medicine

## 2019-11-24 MED ORDER — DILTIAZEM HCL ER COATED BEADS 180 MG PO CP24: 180.0000 mg | ORAL_CAPSULE | Freq: Two times a day (BID) | ORAL | 0 refills | Status: DC

## 2019-11-24 MED FILL — PANTOPRAZOLE SOD DR 40 MG T: 40 | 30 days supply | Qty: 30 | Fill #1

## 2019-11-24 MED FILL — DILTIAZEM HCL ER COATED BEA: 180 | 30 days supply | Qty: 60 | Fill #0

## 2019-11-24 NOTE — Telephone Encounter (Signed)
*  STAT* If patient is at the pharmacy, call can be transferred to refill team.   1. Which medications need to be refilled? (please list name of each medication and dose if known) diltiazem (CARDIZEM CD) 180 MG 24 hr capsule  2. Which pharmacy/location (including street and city if local pharmacy) is medication to be sent to?[place], [place]  3. Do they need a 30 day or 90 day supply? 90 day

## 2019-12-10 ENCOUNTER — Other Ambulatory Visit (INDEPENDENT_AMBULATORY_CARE_PROVIDER_SITE_OTHER): Payer: 59

## 2019-12-10 DIAGNOSIS — D509 Iron deficiency anemia, unspecified: Secondary | ICD-10-CM | POA: Diagnosis not present

## 2019-12-10 LAB — CBC
HCT: 33.6 % — ABNORMAL LOW (ref 36.0–46.0)
Hemoglobin: 11.3 g/dL — ABNORMAL LOW (ref 12.0–15.0)
MCHC: 33.5 g/dL (ref 30.0–36.0)
MCV: 86.9 fl (ref 78.0–100.0)
Platelets: 258 10*3/uL (ref 150.0–400.0)
RBC: 3.87 Mil/uL (ref 3.87–5.11)
RDW: 14 % (ref 11.5–15.5)
WBC: 8.7 10*3/uL (ref 4.0–10.5)

## 2019-12-10 LAB — RETICULOCYTES
ABS Retic: 48490 cells/uL (ref 20000–8000)
Retic Ct Pct: 1.3 %

## 2019-12-10 LAB — IRON: Iron: 54 ug/dL (ref 42–145)

## 2019-12-10 LAB — FERRITIN: Ferritin: 45.4 ng/mL (ref 10.0–291.0)

## 2019-12-11 NOTE — Progress Notes (Signed)
Electrophysiology Office Note Date: 12/12/2019  ID:  Misty Mahoney, Misty Mahoney 02/28/1966, MRN [id number]  PCP: Mosie Lukes, MD Electrophysiologist: Caryl Comes  CC: AF follow up  Misty Mahoney is a 54 y.o. female seen today for Dr Caryl Comes.  She presents today for routine electrophysiology followup.  Since last being seen in our clinic, the patient reports doing very well. She has had 2 episodes of AF that were both terminated with Flecainide. She denies chest pain, dyspnea, PND, orthopnea, nausea, vomiting, dizziness, syncope, edema, weight gain, or early satiety.  Past Medical History:  Diagnosis Date  . Anemia    gestational  . Chicken pox as a child  . Depression 06/15/2015  . DM, gestational, diet controlled 04/10/2014   insulin resistant- A1C5.7   . GERD (gastroesophageal reflux disease)    undx'd GERD   . Hyperlipidemia 12/20/2015  . Hypertension   . Obesity, unspecified 04/10/2014  . OSA (obstructive sleep apnea)   . Osteoarthritis   . Paroxysmal atrial fibrillation (HCC)   . Prediabetes   . SVT (supraventricular tachycardia) ([place])    s/p RFCA 09/26/12  . SVT (supraventricular tachycardia) (HCC)    a. s/p slow pathway modification 2013 Dr Lovena Le  . Vitamin D deficiency 12/26/2015   Past Surgical History:  Procedure Laterality Date  . ADENOIDECTOMY    . ENDOVENOUS ABLATION SAPHENOUS VEIN W/ LASER Bilateral    Thermal ablation  . KNEE ARTHROSCOPY WITH MENISCAL REPAIR Left 08/25/2016   Procedure: KNEE ARTHROSCOPY WITH partial medial meniscectomy, chondroplasty;  Surgeon: Melrose Nakayama, MD;  Location: [facility];  Service: Orthopedics;  Laterality: Left;  KNEE ARTHROSCOPY WITH partial medial meniscectomy, chondroplasty  . PALATE / UVULA BIOPSY / EXCISION    . TONSILLECTOMY  54 yrs old  . V-TACH ABLATION N/A 09/26/2012   Procedure: V-TACH ABLATION;  Surgeon: Evans Lance, MD;  Location: [facility] CATH LAB;  Service: Cardiovascular;  Laterality: N/A;  .  WISDOM TOOTH EXTRACTION  54 yrs old    Current Outpatient Medications  Medication Sig Dispense Refill  . Cholecalciferol (VITAMIN D) 2000 units CAPS Take 1 capsule by mouth daily.    Marland Kitchen diltiazem (CARDIZEM CD) 180 MG 24 hr capsule Take 1 capsule (180 mg total) by mouth 2 (two) times daily. Please make overdue appt with Dr. Caryl Comes before anymore refills. 2nd attempt 60 capsule 0  . escitalopram (LEXAPRO) 10 MG tablet TAKE 1 TABLET BY MOUTH DAILY. 90 tablet 1  . Ferrous Fumarate (HEMOCYTE) 324 (106 Fe) MG TABS tablet Take 1 tablet (106 mg of iron total) by mouth daily. 30 tablet 3  . flecainide (TAMBOCOR) 150 MG tablet Take 2 tablets (300 mg total) by mouth as needed. 90 tablet 3  . hydrochlorothiazide (HYDRODIURIL) 25 MG tablet Take 1 tablet (25 mg total) by mouth daily. 90 tablet 3  . irbesartan (AVAPRO) 150 MG tablet Take 1 tablet (150 mg total) by mouth daily. 90 tablet 3  . Magnesium 250 MG TABS Take 1 tablet by mouth daily.    . Omega-3 Fatty Acids (FISH OIL PO) Take 1 capsule by mouth daily.    . pantoprazole (PROTONIX) 40 MG tablet Take 1 tablet (40 mg total) by mouth daily. 30 tablet 11  . Vitamin D, Ergocalciferol, (DRISDOL) 1.25 MG (50000 UT) CAPS capsule TAKE 1 CAPSULE (50,000 UNITS TOTAL) BY MOUTH ONCE A WEEK. 12 capsule 1   No current facility-administered medications for this visit.    Allergies:   Sunflower oil  Social History: Social History   Socioeconomic History  . Marital status: Significant Other    Spouse name: [name]  . Number of children: 2  . Years of education: Not on file  . Highest education level: Not on file  Occupational History  . Occupation: Therapist, sports  Tobacco Use  . Smoking status: Never Smoker  . Smokeless tobacco: Never Used  Substance and Sexual Activity  . Alcohol use: Yes    Comment: two or three times a year  . Drug use: No  . Sexual activity: Yes    Partners: Male    Comment: lives with kids, significant other and step daughter. minimizing  dairy and gluten, exercise  Other Topics Concern  . Not on file  Social History Narrative  . Not on file   Social Determinants of Health   Financial Resource Strain:   . Difficulty of Paying Living Expenses: Not on file  Food Insecurity:   . Worried About Charity fundraiser in the Last Year: Not on file  . Ran Out of Food in the Last Year: Not on file  Transportation Needs:   . Lack of Transportation (Medical): Not on file  . Lack of Transportation (Non-Medical): Not on file  Physical Activity:   . Days of Exercise per Week: Not on file  . Minutes of Exercise per Session: Not on file  Stress:   . Feeling of Stress : Not on file  Social Connections:   . Frequency of Communication with Friends and Family: Not on file  . Frequency of Social Gatherings with Friends and Family: Not on file  . Attends Religious Services: Not on file  . Active Member of Clubs or Organizations: Not on file  . Attends Archivist Meetings: Not on file  . Marital Status: Not on file  Intimate Partner Violence:   . Fear of Current or Ex-Partner: Not on file  . Emotionally Abused: Not on file  . Physically Abused: Not on file  . Sexually Abused: Not on file    Family History: Family History  Problem Relation Age of Onset  . Hypertension Mother   . Dementia Mother   . Stroke Mother   . Dementia Father   . Hypertension Father   . Cataracts Father        bilateral  . Alzheimer's disease Father   . Colon polyps Father   . Cancer Sister 54       metastatic  . Breast cancer Sister 70  . Hypertension Brother   . ADD / ADHD Daughter   . ADD / ADHD Son   . Heart attack Maternal Grandfather        pacemaker  . Cancer Maternal Grandfather        ?  Marland Kitchen Cancer Paternal Grandmother        colon  . Colon cancer Maternal Grandmother   . Esophageal cancer Neg Hx   . Rectal cancer Neg Hx   . Stomach cancer Neg Hx     Review of Systems: All other systems reviewed and are otherwise negative  except as noted above.   Physical Exam: VS:  BP (!) 162/94   Pulse 65   Ht 5\' 8"  (1.727 m)   Wt 260 lb 12.8 oz (118.3 kg)   LMP 06/24/2018   SpO2 96%   BMI 39.65 kg/m  , BMI Body mass index is 39.65 kg/m. Wt Readings from Last 3 Encounters:  12/12/19 260 lb 12.8 oz (118.3 kg)  12/12/19 260 lb 6 oz (118.1 kg)  10/23/19 251 lb (113.9 kg)    GEN- The patient is well appearing, alert and oriented x 3 today.   HEENT: normocephalic, atraumatic; sclera clear, conjunctiva pink; hearing intact; oropharynx clear; neck supple  Lungs- Clear to ausculation bilaterally, normal work of breathing.  No wheezes, rales, rhonchi Heart- Regular rate and rhythm  GI- soft, non-tender, non-distended, bowel sounds present Extremities- no clubbing, cyanosis, or edema  MS- no significant deformity or atrophy Skin- warm and dry, no rash or lesion  Psych- euthymic mood, full affect Neuro- strength and sensation are intact   EKG:  EKG is ordered today. The ekg ordered today shows sinus rhythm, rate 65, normal intervals   Recent Labs: 07/17/2019: ALT 15; BUN 27; Creatinine, Ser 0.81; Potassium 4.3; Sodium 143; TSH 1.39 12/10/2019: Hemoglobin 11.3; Platelets 258.0    Other studies Reviewed: Additional studies/ records that were reviewed today include: Dr Olin Pia office notes   Assessment and Plan:  1.  Paroxysmal atrial fibrillation Burden very low since last office visit Continue Flecainide as needed CHADS2VASC is 2 (HTN, female), not currently on [place] per guidelines We discussed lifestyle modification today  2.  HTN Stable No change required today   Current medicines are reviewed at length with the patient today.   The patient does not have concerns regarding her medicines.  The following changes were made today:  none  Labs/ tests ordered today include: none Orders Placed This Encounter  Procedures  . EKG 12-Lead     Disposition:   Follow up with Dr Caryl Comes in 1 year      Signed, Chanetta Marshall, NP 12/12/2019 11:42 AM   [address] [place] [address] [phone] (office) [phone] (fax)

## 2019-12-12 ENCOUNTER — Encounter: Payer: Self-pay | Admitting: Nurse Practitioner

## 2019-12-12 ENCOUNTER — Ambulatory Visit (INDEPENDENT_AMBULATORY_CARE_PROVIDER_SITE_OTHER): Payer: 59 | Admitting: Internal Medicine

## 2019-12-12 ENCOUNTER — Encounter: Payer: Self-pay | Admitting: Internal Medicine

## 2019-12-12 ENCOUNTER — Ambulatory Visit: Payer: 59 | Admitting: Nurse Practitioner

## 2019-12-12 ENCOUNTER — Other Ambulatory Visit: Payer: Self-pay

## 2019-12-12 VITALS — BP 160/88 | HR 72 | Temp 98.2°F | Ht 68.0 in | Wt 260.4 lb

## 2019-12-12 VITALS — BP 162/94 | HR 65 | Ht 68.0 in | Wt 260.8 lb

## 2019-12-12 DIAGNOSIS — R1310 Dysphagia, unspecified: Secondary | ICD-10-CM

## 2019-12-12 DIAGNOSIS — I10 Essential (primary) hypertension: Secondary | ICD-10-CM | POA: Diagnosis not present

## 2019-12-12 DIAGNOSIS — I480 Paroxysmal atrial fibrillation: Secondary | ICD-10-CM | POA: Diagnosis not present

## 2019-12-12 DIAGNOSIS — K222 Esophageal obstruction: Secondary | ICD-10-CM | POA: Diagnosis not present

## 2019-12-12 DIAGNOSIS — K219 Gastro-esophageal reflux disease without esophagitis: Secondary | ICD-10-CM | POA: Diagnosis not present

## 2019-12-12 DIAGNOSIS — D500 Iron deficiency anemia secondary to blood loss (chronic): Secondary | ICD-10-CM

## 2019-12-12 MED ORDER — IRBESARTAN 150 MG PO TABS: 150.0000 mg | ORAL_TABLET | Freq: Every day | ORAL | 3 refills | Status: DC

## 2019-12-12 MED ORDER — DILTIAZEM HCL ER COATED BEADS 180 MG PO CP24: 180.0000 mg | ORAL_CAPSULE | Freq: Two times a day (BID) | ORAL | 0 refills | Status: DC

## 2019-12-12 MED ORDER — HYDROCHLOROTHIAZIDE 25 MG PO TABS: 25.0000 mg | ORAL_TABLET | Freq: Every day | ORAL | 3 refills | Status: DC

## 2019-12-12 MED ORDER — IRBESARTAN 300 MG PO TABS: 300.0000 mg | ORAL_TABLET | Freq: Every day | ORAL | 3 refills | Status: DC

## 2019-12-12 MED ORDER — FLECAINIDE ACETATE 150 MG PO TABS: 300.0000 mg | ORAL_TABLET | ORAL | 3 refills | Status: DC | PRN

## 2019-12-12 MED FILL — FLECAINIDE ACETATE 150 MG T: 150 | 45 days supply | Qty: 90 | Fill #0

## 2019-12-12 MED FILL — IRBESARTAN 300 MG TABS: 300 | 90 days supply | Qty: 90 | Fill #0

## 2019-12-12 NOTE — Patient Instructions (Addendum)
Continue PPI  Please follow up in one year.

## 2019-12-12 NOTE — Patient Instructions (Addendum)
Medication Instructions:  none *If you need a refill on your cardiac medications before your next appointment, please call your pharmacy*  Lab Work: none If you have labs (blood work) drawn today and your tests are completely normal, you will receive your results only by: Marland Kitchen MyChart Message (if you have MyChart) OR . A paper copy in the mail If you have any lab test that is abnormal or we need to change your treatment, we will call you to review the results.  Testing/Procedures: none  Follow-Up: At [facility], you and your health needs are our priority.  As part of our continuing mission to provide you with exceptional heart care, we have created designated Provider Care Teams.  These Care Teams include your primary Cardiologist (physician) and Advanced Practice Providers (APPs -  Physician Assistants and Nurse Practitioners) who all work together to provide you with the care you need, when you need it.  Your next appointment:   1 year(s)  The format for your next appointment:   In Person  Provider:   Dr Caryl Comes  Other Instructions

## 2019-12-12 NOTE — Progress Notes (Signed)
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  Patient also has a history of chronic normocytic anemia.  She underwent upper endoscopy October 23, 2019.  She was found to have erosive esophagitis and dense peptic stricture.  Esophagus was dilated with 54 French Maloney dilator.  Because of anemia, duodenal biopsies were obtained.  These returned normal.  No evidence of sprue.  Testing for Helicobacter pylori was also performed.  This returned negative.  She was placed on pantoprazole 40 mg daily and follows up at this time.  She is pleased to report that she has had no issues with heartburn or indigestion.  No recurrent dysphagia.  She is quite pleased.  She has appropriate questions regarding long-term medication use and her condition.  She also had questions regarding any possible need for B12 replacement.  Several values in 2019 were normal.  Most recent iron studies were also normal.  She does take an iron supplement.  Of note, the patient has also undergone previous screening colonoscopy September 2019.  At that time found to have both tubular adenoma and sessile serrated polyp without dysplasia.  Follow-up in 5 years recommended.  Hemoglobin obtained earlier this week was 11.3 (up from 10.9)  REVIEW OF SYSTEMS:  All non-GI ROS negative unless otherwise stated in the HPI except for fatigue  Past Medical History:  Diagnosis Date  . Anemia    gestational  . Atrial fibrillation ([place])   . Chicken pox as a child  . Depression 06/15/2015  . DM, gestational, diet controlled 04/10/2014   insulin resistant- A1C5.7   . Fatigue   . GERD (gastroesophageal reflux disease)    undx'd GERD   . H/O gestational diabetes mellitus, not currently pregnant 04/10/2014  . Heart murmur   . History  of chicken pox   . Hyperlipidemia 12/20/2015  . Hypertension   . Joint pain   . Obesity, unspecified 04/10/2014  . OSA (obstructive sleep apnea)   . Osteoarthritis   . Palpitations   . Prediabetes   . Preventative health care 12/26/2015  . Sleep apnea    wears cpap   . SVT (supraventricular tachycardia) ([place])    s/p RFCA 09/26/12  . Vitamin D deficiency 12/26/2015    Past Surgical History:  Procedure Laterality Date  . ADENOIDECTOMY    . CARDIAC ELECTROPHYSIOLOGY STUDY AND ABLATION  2013  . ENDOVENOUS ABLATION SAPHENOUS VEIN W/ LASER Bilateral    Thermal ablation  . KNEE ARTHROSCOPY WITH MENISCAL REPAIR Left 08/25/2016   Procedure: KNEE ARTHROSCOPY WITH partial medial meniscectomy, chondroplasty;  Surgeon: Melrose Nakayama, MD;  Location: [facility];  Service: Orthopedics;  Laterality: Left;  KNEE ARTHROSCOPY WITH partial medial meniscectomy, chondroplasty  . PALATE / UVULA BIOPSY / EXCISION    . TONSILLECTOMY  54 yrs old  . V-TACH ABLATION N/A 09/26/2012   Procedure: V-TACH ABLATION;  Surgeon: Evans Lance, MD;  [facility] C[facility] LAB;  Service: Cardiovascular;  Laterality: N/A;  . WISDOM TOOTH EXTRACTION  54 yrs old    Social History Canesha Hipps  reports that she has never smoked. She has never used smokeless tobacco. She reports current alcohol use. She reports that she does not use drugs.  family history includes ADD / ADHD in her daughter and son; Alzheimer's disease in her father; Breast cancer (age  of onset: 48) in her sister; Cancer in her maternal grandfather and paternal grandmother; Cancer (age of onset: 69) in her sister; Cataracts in her father; Colon cancer in her maternal grandmother; Colon polyps in her father; Dementia in her father and mother; Heart attack in her maternal grandfather; Hypertension in her brother, father, and mother; Stroke in her mother.  Allergies  Allergen Reactions  . Sunflower Oil Swelling    Seeds        PHYSICAL  EXAMINATION: Vital signs: BP (!) 160/88 (BP Location: Left Arm, Patient Position: Sitting, Cuff Size: Large)   Pulse 72   Temp 98.2 F (36.8 C)   Ht 5\' 8"  (1.727 m) Comment: height measured without shoes  Wt 260 lb 6 oz (118.1 kg)   LMP 06/24/2018   BMI 39.59 kg/m   Constitutional: Pleasant, generally well-appearing, no acute distress Psychiatric: alert and oriented x3, cooperative Eyes: Anicteric Mouth: Mask Abdomen: Not reexamined Neuro: No gross deficits  ASSESSMENT:  1.  GERD complicated by erosive esophagitis and peptic stricture.  Asymptomatic post dilation on PPI 2.  History of adenomatous and sessile serrated colon polyps September 2019 3.  Obesity 4.  Mild normocytic anemia.  B12 and iron levels normal.  Duodenal biopsies negative.  No evidence for H. pylori   PLAN:  1.  Reflux precautions 2.  Weight loss.  Patient is working toward this 3.  Continue pantoprazole 40 mg daily.  Prescription refilled for 1 year.  Medication risks reviewed. 4.  Continue iron supplement 5.  Surveillance colonoscopy around September 2024 6.  Routine GI office follow-up 1 year.  Contact the office in the interim for any questions or problems such as breakthrough reflux symptoms and/or recurrent dysphagia. 20 minutes total time spent preparing to see the patient, reviewing test, obtaining history, performing medically appropriate exam, educating regarding her conditions, ordering medication, and documenting clinical information in the health record.

## 2019-12-16 ENCOUNTER — Other Ambulatory Visit: Payer: Self-pay

## 2019-12-16 MED ORDER — DILTIAZEM HCL ER COATED BEADS 180 MG PO CP24: 180.0000 mg | ORAL_CAPSULE | Freq: Two times a day (BID) | ORAL | 0 refills | Status: DC

## 2019-12-18 MED FILL — DILTIAZEM HCL ER COATED BEA: 180 | 30 days supply | Qty: 60 | Fill #0

## 2019-12-22 ENCOUNTER — Encounter: Payer: Self-pay | Admitting: Family Medicine

## 2019-12-22 ENCOUNTER — Other Ambulatory Visit: Payer: Self-pay | Admitting: Family Medicine

## 2019-12-22 DIAGNOSIS — E119 Type 2 diabetes mellitus without complications: Secondary | ICD-10-CM | POA: Diagnosis not present

## 2019-12-22 DIAGNOSIS — E559 Vitamin D deficiency, unspecified: Secondary | ICD-10-CM

## 2019-12-22 MED FILL — VIT D2 1.25 MG (50,000 UNIT: 1.25 MG | 84 days supply | Qty: 12 | Fill #0

## 2019-12-22 MED FILL — PANTOPRAZOLE SOD DR 40 MG T: 40 | 30 days supply | Qty: 30 | Fill #2

## 2019-12-22 MED FILL — FERROCITE TABLET: 324 | 100 days supply | Qty: 100 | Fill #0

## 2020-01-20 ENCOUNTER — Ambulatory Visit: Payer: No Typology Code available for payment source | Admitting: Family Medicine

## 2020-01-22 ENCOUNTER — Other Ambulatory Visit: Payer: Self-pay | Admitting: Family Medicine

## 2020-01-22 ENCOUNTER — Other Ambulatory Visit: Payer: Self-pay | Admitting: Nurse Practitioner

## 2020-01-22 MED FILL — ESCITALOPRAM 10 MG TABLET: 10 | 90 days supply | Qty: 90 | Fill #0

## 2020-01-22 MED FILL — DILTIAZEM HCL ER COATED BEA: 180 | 90 days supply | Qty: 180 | Fill #1

## 2020-01-22 MED FILL — DILTIAZEM HCL ER COATED BEA: 180 | 90 days supply | Qty: 180 | Fill #0

## 2020-01-22 MED FILL — HYDROCHLOROTHIAZIDE 25 MG T: 25 | 90 days supply | Qty: 90 | Fill #0

## 2020-01-22 MED FILL — PANTOPRAZOLE SOD DR 40 MG T: 40 | 90 days supply | Qty: 90 | Fill #3

## 2020-01-30 ENCOUNTER — Other Ambulatory Visit: Payer: Self-pay | Admitting: General Surgery

## 2020-01-30 DIAGNOSIS — N6452 Nipple discharge: Secondary | ICD-10-CM

## 2020-02-14 DIAGNOSIS — Z20828 Contact with and (suspected) exposure to other viral communicable diseases: Secondary | ICD-10-CM | POA: Diagnosis not present

## 2020-02-16 ENCOUNTER — Ambulatory Visit: Payer: 59 | Admitting: Family Medicine

## 2020-02-19 ENCOUNTER — Ambulatory Visit
Admission: RE | Admit: 2020-02-19 | Discharge: 2020-02-19 | Disposition: A | Discharge status: Home / Self Care | Payer: 59 | Source: Ambulatory Visit | Attending: General Surgery | Admitting: General Surgery

## 2020-02-19 ENCOUNTER — Other Ambulatory Visit: Payer: Self-pay

## 2020-02-19 ENCOUNTER — Other Ambulatory Visit: Payer: Self-pay | Admitting: General Surgery

## 2020-02-19 DIAGNOSIS — R922 Inconclusive mammogram: Secondary | ICD-10-CM | POA: Diagnosis not present

## 2020-02-19 DIAGNOSIS — N6452 Nipple discharge: Secondary | ICD-10-CM

## 2020-02-20 ENCOUNTER — Ambulatory Visit
Admission: RE | Admit: 2020-02-20 | Discharge: 2020-02-20 | Disposition: A | Discharge status: Home / Self Care | Payer: 59 | Source: Ambulatory Visit | Attending: General Surgery | Admitting: General Surgery

## 2020-02-20 DIAGNOSIS — N6452 Nipple discharge: Secondary | ICD-10-CM

## 2020-02-20 DIAGNOSIS — N6315 Unspecified lump in the right breast, overlapping quadrants: Secondary | ICD-10-CM | POA: Diagnosis not present

## 2020-02-20 DIAGNOSIS — D241 Benign neoplasm of right breast: Secondary | ICD-10-CM | POA: Diagnosis not present

## 2020-02-24 DIAGNOSIS — I4891 Unspecified atrial fibrillation: Secondary | ICD-10-CM | POA: Diagnosis not present

## 2020-02-24 DIAGNOSIS — D649 Anemia, unspecified: Secondary | ICD-10-CM | POA: Diagnosis not present

## 2020-02-24 DIAGNOSIS — I10 Essential (primary) hypertension: Secondary | ICD-10-CM | POA: Diagnosis not present

## 2020-02-24 DIAGNOSIS — E559 Vitamin D deficiency, unspecified: Secondary | ICD-10-CM | POA: Diagnosis not present

## 2020-02-24 DIAGNOSIS — Z0000 Encounter for general adult medical examination without abnormal findings: Secondary | ICD-10-CM | POA: Diagnosis not present

## 2020-02-24 DIAGNOSIS — R7301 Impaired fasting glucose: Secondary | ICD-10-CM | POA: Diagnosis not present

## 2020-03-09 DIAGNOSIS — Z803 Family history of malignant neoplasm of breast: Secondary | ICD-10-CM | POA: Diagnosis not present

## 2020-03-09 DIAGNOSIS — N6452 Nipple discharge: Secondary | ICD-10-CM | POA: Diagnosis not present

## 2020-03-18 ENCOUNTER — Other Ambulatory Visit: Payer: Self-pay | Admitting: General Surgery

## 2020-03-18 DIAGNOSIS — D649 Anemia, unspecified: Secondary | ICD-10-CM | POA: Diagnosis not present

## 2020-03-18 DIAGNOSIS — E559 Vitamin D deficiency, unspecified: Secondary | ICD-10-CM | POA: Diagnosis not present

## 2020-03-18 DIAGNOSIS — R7301 Impaired fasting glucose: Secondary | ICD-10-CM | POA: Diagnosis not present

## 2020-03-18 DIAGNOSIS — N6452 Nipple discharge: Secondary | ICD-10-CM

## 2020-03-18 DIAGNOSIS — I4891 Unspecified atrial fibrillation: Secondary | ICD-10-CM | POA: Diagnosis not present

## 2020-03-18 DIAGNOSIS — I10 Essential (primary) hypertension: Secondary | ICD-10-CM | POA: Diagnosis not present

## 2020-03-19 MED FILL — DILT-XR 240 MG CAP SA: 240 | 90 days supply | Qty: 90 | Fill #0

## 2020-03-28 MED FILL — IRBESARTAN 300 MG TABS: 300 | 90 days supply | Qty: 90 | Fill #1

## 2020-04-05 MED FILL — VIT D2 1.25 MG (50,000 UNIT: 1.25 MG | 84 days supply | Qty: 12 | Fill #1

## 2020-04-08 MED FILL — IRBESARTAN 300 MG TABS: 300 | 90 days supply | Qty: 90 | Fill #1

## 2020-04-13 DIAGNOSIS — R7301 Impaired fasting glucose: Secondary | ICD-10-CM | POA: Diagnosis not present

## 2020-04-13 DIAGNOSIS — I10 Essential (primary) hypertension: Secondary | ICD-10-CM | POA: Diagnosis not present

## 2020-04-13 DIAGNOSIS — E559 Vitamin D deficiency, unspecified: Secondary | ICD-10-CM | POA: Diagnosis not present

## 2020-04-13 DIAGNOSIS — I4891 Unspecified atrial fibrillation: Secondary | ICD-10-CM | POA: Diagnosis not present

## 2020-04-13 DIAGNOSIS — D649 Anemia, unspecified: Secondary | ICD-10-CM | POA: Diagnosis not present

## 2020-04-13 MED FILL — DOXAZOSIN MESYLATE TAB 1 MG: 1 | 90 days supply | Qty: 90 | Fill #0

## 2020-04-23 MED FILL — HYDROCHLOROTHIAZIDE 25 MG T: 25 | 90 days supply | Qty: 90 | Fill #1

## 2020-04-23 MED FILL — PANTOPRAZOLE SOD DR 40 MG T: 40 | 90 days supply | Qty: 90 | Fill #4

## 2020-04-23 MED FILL — ESCITALOPRAM 10 MG TABLET: 10 | 90 days supply | Qty: 90 | Fill #1

## 2020-04-27 ENCOUNTER — Other Ambulatory Visit: Payer: Self-pay

## 2020-04-27 ENCOUNTER — Ambulatory Visit
Admission: RE | Admit: 2020-04-27 | Discharge: 2020-04-27 | Disposition: A | Discharge status: Home / Self Care | Payer: 59 | Source: Ambulatory Visit | Attending: General Surgery | Admitting: General Surgery

## 2020-04-27 DIAGNOSIS — N6452 Nipple discharge: Secondary | ICD-10-CM | POA: Diagnosis not present

## 2020-04-27 MED ORDER — GADOBUTROL 1 MMOL/ML IV SOLN
10.0000 mL | Freq: Once | INTRAVENOUS | Status: AC | PRN
  Administered 2020-04-27: 10 mL via INTRAVENOUS

## 2020-05-18 ENCOUNTER — Other Ambulatory Visit: Payer: Self-pay | Admitting: General Surgery

## 2020-05-18 DIAGNOSIS — N6452 Nipple discharge: Secondary | ICD-10-CM | POA: Diagnosis not present

## 2020-05-25 ENCOUNTER — Other Ambulatory Visit: Payer: Self-pay | Admitting: General Surgery

## 2020-05-25 DIAGNOSIS — N6452 Nipple discharge: Secondary | ICD-10-CM

## 2020-05-27 DIAGNOSIS — E559 Vitamin D deficiency, unspecified: Secondary | ICD-10-CM | POA: Diagnosis not present

## 2020-05-27 DIAGNOSIS — D649 Anemia, unspecified: Secondary | ICD-10-CM | POA: Diagnosis not present

## 2020-05-27 DIAGNOSIS — I4891 Unspecified atrial fibrillation: Secondary | ICD-10-CM | POA: Diagnosis not present

## 2020-05-27 DIAGNOSIS — R7301 Impaired fasting glucose: Secondary | ICD-10-CM | POA: Diagnosis not present

## 2020-05-27 DIAGNOSIS — I10 Essential (primary) hypertension: Secondary | ICD-10-CM | POA: Diagnosis not present

## 2020-05-27 MED FILL — OZEMPIC (1 MG/DOSE) 4 MG/3M: 4 | 28 days supply | Qty: 3 | Fill #0

## 2020-06-08 MED FILL — OZEMPIC (1 MG/DOSE) 4 MG/3M: 4 | 28 days supply | Qty: 3 | Fill #0

## 2020-07-01 MED FILL — DILT-XR 240 MG CAP SA: 240 | 90 days supply | Qty: 90 | Fill #0

## 2020-07-02 MED FILL — OZEMPIC (1 MG/DOSE) 4 MG/3M: 4 | 28 days supply | Qty: 3 | Fill #0

## 2020-07-05 DIAGNOSIS — I4891 Unspecified atrial fibrillation: Secondary | ICD-10-CM | POA: Diagnosis not present

## 2020-07-05 DIAGNOSIS — E559 Vitamin D deficiency, unspecified: Secondary | ICD-10-CM | POA: Diagnosis not present

## 2020-07-05 DIAGNOSIS — D649 Anemia, unspecified: Secondary | ICD-10-CM | POA: Diagnosis not present

## 2020-07-05 DIAGNOSIS — R7301 Impaired fasting glucose: Secondary | ICD-10-CM | POA: Diagnosis not present

## 2020-07-05 DIAGNOSIS — I10 Essential (primary) hypertension: Secondary | ICD-10-CM | POA: Diagnosis not present

## 2020-07-07 MED FILL — IRBESARTAN 300 MG TAB: 300 | 90 days supply | Qty: 90 | Fill #2

## 2020-07-07 MED FILL — ESCITALOPRAM 10 MG TABLET: 10 | 90 days supply | Qty: 90 | Fill #0

## 2020-07-14 MED FILL — HYDROCHLOROTHIAZIDE 25 MG T: 25 | 90 days supply | Qty: 90 | Fill #2

## 2020-07-22 MED FILL — metFORMIN HCL ER 500 MG TB2: 500 | 90 days supply | Qty: 90 | Fill #0

## 2020-07-27 ENCOUNTER — Encounter (HOSPITAL_BASED_OUTPATIENT_CLINIC_OR_DEPARTMENT_OTHER): Payer: Self-pay | Admitting: General Surgery

## 2020-07-27 ENCOUNTER — Other Ambulatory Visit: Payer: Self-pay

## 2020-07-27 MED FILL — PANTOPRAZOLE SOD DR 40 MG T: 40 | 90 days supply | Qty: 90 | Fill #5

## 2020-07-27 NOTE — Progress Notes (Signed)
Chart and most recent cardiology note reviewed with Dr. Ola Spurr, ok to proceed with surgery without further cardiac work up.

## 2020-07-30 ENCOUNTER — Other Ambulatory Visit (HOSPITAL_COMMUNITY)
Admission: RE | Admit: 2020-07-30 | Discharge: 2020-07-30 | Disposition: A | Discharge status: Home / Self Care | Payer: 59 | Source: Ambulatory Visit | Attending: General Surgery | Admitting: General Surgery

## 2020-07-30 ENCOUNTER — Other Ambulatory Visit: Payer: Self-pay

## 2020-07-30 ENCOUNTER — Encounter (HOSPITAL_BASED_OUTPATIENT_CLINIC_OR_DEPARTMENT_OTHER)
Admission: RE | Admit: 2020-07-30 | Discharge: 2020-07-30 | Disposition: A | Discharge status: Home / Self Care | Payer: 59 | Source: Ambulatory Visit | Attending: General Surgery | Admitting: General Surgery

## 2020-07-30 DIAGNOSIS — Z79899 Other long term (current) drug therapy: Secondary | ICD-10-CM | POA: Diagnosis not present

## 2020-07-30 DIAGNOSIS — Z01812 Encounter for preprocedural laboratory examination: Secondary | ICD-10-CM | POA: Insufficient documentation

## 2020-07-30 DIAGNOSIS — D241 Benign neoplasm of right breast: Secondary | ICD-10-CM | POA: Diagnosis not present

## 2020-07-30 DIAGNOSIS — N6341 Unspecified lump in right breast, subareolar: Secondary | ICD-10-CM | POA: Diagnosis not present

## 2020-07-30 DIAGNOSIS — G4730 Sleep apnea, unspecified: Secondary | ICD-10-CM | POA: Diagnosis not present

## 2020-07-30 DIAGNOSIS — R030 Elevated blood-pressure reading, without diagnosis of hypertension: Secondary | ICD-10-CM | POA: Diagnosis not present

## 2020-07-30 DIAGNOSIS — Z8249 Family history of ischemic heart disease and other diseases of the circulatory system: Secondary | ICD-10-CM | POA: Diagnosis not present

## 2020-07-30 DIAGNOSIS — Z20822 Contact with and (suspected) exposure to covid-19: Secondary | ICD-10-CM | POA: Insufficient documentation

## 2020-07-30 DIAGNOSIS — I4891 Unspecified atrial fibrillation: Secondary | ICD-10-CM | POA: Diagnosis not present

## 2020-07-30 DIAGNOSIS — K219 Gastro-esophageal reflux disease without esophagitis: Secondary | ICD-10-CM | POA: Diagnosis not present

## 2020-07-30 LAB — BASIC METABOLIC PANEL
Anion gap: 9 (ref 5–15)
BUN: 27 mg/dL — ABNORMAL HIGH (ref 6–20)
CO2: 28 mmol/L (ref 22–32)
Calcium: 9.2 mg/dL (ref 8.9–10.3)
Chloride: 103 mmol/L (ref 98–111)
Creatinine, Ser: 0.91 mg/dL (ref 0.44–1.00)
GFR calc Af Amer: >60 mL/min (ref >60)
GFR calc non Af Amer: >60 mL/min (ref >60)
Glucose, Bld: 163 mg/dL — ABNORMAL HIGH (ref 70–99)
Potassium: 3.4 mmol/L — ABNORMAL LOW (ref 3.5–5.1)
Sodium: 140 mmol/L (ref 135–145)

## 2020-07-30 LAB — SARS CORONAVIRUS 2 (TAT 6-24 HRS): SARS Coronavirus 2: NEGATIVE

## 2020-07-30 NOTE — Progress Notes (Signed)

## 2020-08-02 ENCOUNTER — Other Ambulatory Visit: Payer: Self-pay

## 2020-08-02 ENCOUNTER — Ambulatory Visit
Admission: RE | Admit: 2020-08-02 | Discharge: 2020-08-02 | Disposition: A | Discharge status: Home / Self Care | Payer: 59 | Source: Ambulatory Visit | Attending: General Surgery | Admitting: General Surgery

## 2020-08-02 DIAGNOSIS — N6452 Nipple discharge: Secondary | ICD-10-CM

## 2020-08-02 DIAGNOSIS — R928 Other abnormal and inconclusive findings on diagnostic imaging of breast: Secondary | ICD-10-CM | POA: Diagnosis not present

## 2020-08-03 ENCOUNTER — Encounter (HOSPITAL_BASED_OUTPATIENT_CLINIC_OR_DEPARTMENT_OTHER)
Admission: RE | Disposition: A | Discharge status: Home / Self Care | Payer: Self-pay | Source: Home / Self Care | Attending: General Surgery

## 2020-08-03 ENCOUNTER — Other Ambulatory Visit: Payer: Self-pay

## 2020-08-03 ENCOUNTER — Ambulatory Visit (HOSPITAL_BASED_OUTPATIENT_CLINIC_OR_DEPARTMENT_OTHER): Payer: 59 | Admitting: Certified Registered"

## 2020-08-03 ENCOUNTER — Ambulatory Visit
Admission: RE | Admit: 2020-08-03 | Discharge: 2020-08-03 | Disposition: A | Discharge status: Home / Self Care | Payer: 59 | Source: Ambulatory Visit | Attending: General Surgery | Admitting: General Surgery

## 2020-08-03 ENCOUNTER — Ambulatory Visit (HOSPITAL_BASED_OUTPATIENT_CLINIC_OR_DEPARTMENT_OTHER)
Admission: RE | Admit: 2020-08-03 | Discharge: 2020-08-03 | Disposition: A | Discharge status: Home / Self Care | Payer: 59 | Attending: General Surgery | Admitting: General Surgery

## 2020-08-03 ENCOUNTER — Encounter (HOSPITAL_BASED_OUTPATIENT_CLINIC_OR_DEPARTMENT_OTHER): Payer: Self-pay | Admitting: General Surgery

## 2020-08-03 DIAGNOSIS — Z8249 Family history of ischemic heart disease and other diseases of the circulatory system: Secondary | ICD-10-CM | POA: Diagnosis not present

## 2020-08-03 DIAGNOSIS — G4730 Sleep apnea, unspecified: Secondary | ICD-10-CM | POA: Diagnosis not present

## 2020-08-03 DIAGNOSIS — N6011 Diffuse cystic mastopathy of right breast: Secondary | ICD-10-CM | POA: Diagnosis not present

## 2020-08-03 DIAGNOSIS — N6310 Unspecified lump in the right breast, unspecified quadrant: Secondary | ICD-10-CM | POA: Diagnosis not present

## 2020-08-03 DIAGNOSIS — K219 Gastro-esophageal reflux disease without esophagitis: Secondary | ICD-10-CM | POA: Insufficient documentation

## 2020-08-03 DIAGNOSIS — I4891 Unspecified atrial fibrillation: Secondary | ICD-10-CM | POA: Diagnosis not present

## 2020-08-03 DIAGNOSIS — R922 Inconclusive mammogram: Secondary | ICD-10-CM | POA: Diagnosis not present

## 2020-08-03 DIAGNOSIS — N6341 Unspecified lump in right breast, subareolar: Secondary | ICD-10-CM | POA: Diagnosis not present

## 2020-08-03 DIAGNOSIS — E785 Hyperlipidemia, unspecified: Secondary | ICD-10-CM | POA: Diagnosis not present

## 2020-08-03 DIAGNOSIS — N6091 Unspecified benign mammary dysplasia of right breast: Secondary | ICD-10-CM | POA: Diagnosis not present

## 2020-08-03 DIAGNOSIS — N6452 Nipple discharge: Secondary | ICD-10-CM | POA: Diagnosis not present

## 2020-08-03 DIAGNOSIS — Z79899 Other long term (current) drug therapy: Secondary | ICD-10-CM | POA: Diagnosis not present

## 2020-08-03 DIAGNOSIS — R030 Elevated blood-pressure reading, without diagnosis of hypertension: Secondary | ICD-10-CM | POA: Insufficient documentation

## 2020-08-03 DIAGNOSIS — D241 Benign neoplasm of right breast: Secondary | ICD-10-CM | POA: Insufficient documentation

## 2020-08-03 DIAGNOSIS — R928 Other abnormal and inconclusive findings on diagnostic imaging of breast: Secondary | ICD-10-CM | POA: Diagnosis not present

## 2020-08-03 HISTORY — PX: BREAST LUMPECTOMY WITH RADIOACTIVE SEED LOCALIZATION: SHX6424

## 2020-08-03 LAB — GLUCOSE, CAPILLARY
Glucose-Capillary: 105 mg/dL — ABNORMAL HIGH (ref 70–99)
Glucose-Capillary: 114 mg/dL — ABNORMAL HIGH (ref 70–99)

## 2020-08-03 SURGERY — BREAST LUMPECTOMY WITH RADIOACTIVE SEED LOCALIZATION
Anesthesia: General | Site: Breast | Laterality: Right

## 2020-08-03 MED ORDER — ONDANSETRON HCL 4 MG/2ML IJ SOLN
INTRAMUSCULAR | Status: AC
  Filled 2020-08-03: qty 2

## 2020-08-03 MED ORDER — DEXAMETHASONE SODIUM PHOSPHATE 10 MG/ML IJ SOLN
INTRAMUSCULAR | Status: DC | PRN
  Administered 2020-08-03: 5 mg via INTRAVENOUS

## 2020-08-03 MED ORDER — GABAPENTIN 100 MG PO CAPS
100.0000 mg | ORAL_CAPSULE | ORAL | Status: AC
  Administered 2020-08-03: 100 mg via ORAL

## 2020-08-03 MED ORDER — LIDOCAINE 2% (20 MG/ML) 5 ML SYRINGE
INTRAMUSCULAR | Status: AC
  Filled 2020-08-03: qty 5

## 2020-08-03 MED ORDER — HYDROMORPHONE HCL 1 MG/ML IJ SOLN: 0.2500 mg | INTRAMUSCULAR | Status: DC | PRN

## 2020-08-03 MED ORDER — ARTIFICIAL TEARS OPHTHALMIC OINT
TOPICAL_OINTMENT | OPHTHALMIC | Status: DC | PRN
  Administered 2020-08-03: 1 via OPHTHALMIC

## 2020-08-03 MED ORDER — PROPOFOL 500 MG/50ML IV EMUL
INTRAVENOUS | Status: AC
  Filled 2020-08-03: qty 50

## 2020-08-03 MED ORDER — DEXMEDETOMIDINE (PRECEDEX) IN NS 20 MCG/5ML (4 MCG/ML) IV SYRINGE
PREFILLED_SYRINGE | INTRAVENOUS | Status: DC | PRN
  Administered 2020-08-03 (×5): 8 ug via INTRAVENOUS

## 2020-08-03 MED ORDER — LIDOCAINE HCL (CARDIAC) PF 100 MG/5ML IV SOSY
PREFILLED_SYRINGE | INTRAVENOUS | Status: DC | PRN
  Administered 2020-08-03: 50 mg via INTRATRACHEAL

## 2020-08-03 MED ORDER — OXYCODONE HCL 5 MG/5ML PO SOLN: 5.0000 mg | Freq: Once | ORAL | Status: DC | PRN

## 2020-08-03 MED ORDER — ACETAMINOPHEN 500 MG PO TABS
ORAL_TABLET | ORAL | Status: AC
  Filled 2020-08-03: qty 2

## 2020-08-03 MED ORDER — GLYCOPYRROLATE PF 0.2 MG/ML IJ SOSY
PREFILLED_SYRINGE | INTRAMUSCULAR | Status: AC
  Filled 2020-08-03: qty 1

## 2020-08-03 MED ORDER — DEXAMETHASONE SODIUM PHOSPHATE 10 MG/ML IJ SOLN
INTRAMUSCULAR | Status: AC
  Filled 2020-08-03: qty 1

## 2020-08-03 MED ORDER — PROPOFOL 10 MG/ML IV BOLUS
INTRAVENOUS | Status: AC
  Filled 2020-08-03: qty 20

## 2020-08-03 MED ORDER — ONDANSETRON HCL 4 MG/2ML IJ SOLN
INTRAMUSCULAR | Status: DC | PRN
  Administered 2020-08-03: 4 mg via INTRAVENOUS

## 2020-08-03 MED ORDER — FENTANYL CITRATE (PF) 100 MCG/2ML IJ SOLN
INTRAMUSCULAR | Status: AC
  Filled 2020-08-03: qty 2

## 2020-08-03 MED ORDER — KETOROLAC TROMETHAMINE 15 MG/ML IJ SOLN
15.0000 mg | INTRAMUSCULAR | Status: AC
  Administered 2020-08-03: 15 mg via INTRAVENOUS

## 2020-08-03 MED ORDER — OXYCODONE HCL 5 MG PO TABS: 5.0000 mg | ORAL_TABLET | Freq: Once | ORAL | Status: DC | PRN

## 2020-08-03 MED ORDER — FENTANYL CITRATE (PF) 100 MCG/2ML IJ SOLN
INTRAMUSCULAR | Status: DC | PRN
  Administered 2020-08-03: 25 ug via INTRAVENOUS
  Administered 2020-08-03: 50 ug via INTRAVENOUS
  Administered 2020-08-03: 25 ug via INTRAVENOUS

## 2020-08-03 MED ORDER — MEPERIDINE HCL 25 MG/ML IJ SOLN: 6.2500 mg | INTRAMUSCULAR | Status: DC | PRN

## 2020-08-03 MED ORDER — BUPIVACAINE HCL (PF) 0.25 % IJ SOLN
INTRAMUSCULAR | Status: AC
  Filled 2020-08-03: qty 30

## 2020-08-03 MED ORDER — ENSURE PRE-SURGERY PO LIQD: 296.0000 mL | Freq: Once | ORAL | Status: DC

## 2020-08-03 MED ORDER — CEFAZOLIN SODIUM-DEXTROSE 2-4 GM/100ML-% IV SOLN
2.0000 g | INTRAVENOUS | Status: AC
  Administered 2020-08-03: 2 g via INTRAVENOUS

## 2020-08-03 MED ORDER — CEFAZOLIN SODIUM-DEXTROSE 2-4 GM/100ML-% IV SOLN
INTRAVENOUS | Status: AC
  Filled 2020-08-03: qty 100

## 2020-08-03 MED ORDER — DEXMEDETOMIDINE (PRECEDEX) IN NS 20 MCG/5ML (4 MCG/ML) IV SYRINGE
PREFILLED_SYRINGE | INTRAVENOUS | Status: AC
  Filled 2020-08-03: qty 5

## 2020-08-03 MED ORDER — PROPOFOL 10 MG/ML IV BOLUS
INTRAVENOUS | Status: DC | PRN
  Administered 2020-08-03 (×2): 50 mg via INTRAVENOUS
  Administered 2020-08-03 (×2): 150 mg via INTRAVENOUS

## 2020-08-03 MED ORDER — PROPOFOL 500 MG/50ML IV EMUL
INTRAVENOUS | Status: DC | PRN
  Administered 2020-08-03: 125 ug/kg/min via INTRAVENOUS
  Administered 2020-08-03: 50 ug/kg/min via INTRAVENOUS

## 2020-08-03 MED ORDER — BUPIVACAINE HCL (PF) 0.25 % IJ SOLN
INTRAMUSCULAR | Status: DC | PRN
  Administered 2020-08-03: 10 mL

## 2020-08-03 MED ORDER — KETOROLAC TROMETHAMINE 15 MG/ML IJ SOLN
INTRAMUSCULAR | Status: AC
  Filled 2020-08-03: qty 1

## 2020-08-03 MED ORDER — LACTATED RINGERS IV SOLN: INTRAVENOUS | Status: DC

## 2020-08-03 MED ORDER — ACETAMINOPHEN 500 MG PO TABS
1000.0000 mg | ORAL_TABLET | ORAL | Status: AC
  Administered 2020-08-03: 1000 mg via ORAL

## 2020-08-03 MED ORDER — GABAPENTIN 100 MG PO CAPS
ORAL_CAPSULE | ORAL | Status: AC
  Filled 2020-08-03: qty 1

## 2020-08-03 MED ORDER — PROMETHAZINE HCL 25 MG/ML IJ SOLN: 6.2500 mg | INTRAMUSCULAR | Status: DC | PRN

## 2020-08-03 MED ORDER — MIDAZOLAM HCL 2 MG/2ML IJ SOLN
INTRAMUSCULAR | Status: AC
  Filled 2020-08-03: qty 2

## 2020-08-03 MED ORDER — AMISULPRIDE (ANTIEMETIC) 5 MG/2ML IV SOLN: 10.0000 mg | Freq: Once | INTRAVENOUS | Status: DC | PRN

## 2020-08-03 SURGICAL SUPPLY — 66 items
ADH SKN CLS APL DERMABOND .7 (GAUZE/BANDAGES/DRESSINGS) ×1
APL PRP STRL LF DISP 70% ISPRP (MISCELLANEOUS) ×1
APPLIER CLIP 9.375 MED OPEN (MISCELLANEOUS)
APR CLP MED 9.3 20 MLT OPN (MISCELLANEOUS)
BINDER BREAST LRG (GAUZE/BANDAGES/DRESSINGS) IMPLANT
BINDER BREAST MEDIUM (GAUZE/BANDAGES/DRESSINGS) IMPLANT
BINDER BREAST XLRG (GAUZE/BANDAGES/DRESSINGS) IMPLANT
BINDER BREAST XXLRG (GAUZE/BANDAGES/DRESSINGS) ×2 IMPLANT
BLADE SURG 15 STRL LF DISP TIS (BLADE) ×1 IMPLANT
BLADE SURG 15 STRL SS (BLADE) ×3
CANISTER SUC SOCK COL 7IN (MISCELLANEOUS) IMPLANT
CANISTER SUCT 1200ML W/VALVE (MISCELLANEOUS) ×2 IMPLANT
CHLORAPREP W/TINT 26 (MISCELLANEOUS) ×3 IMPLANT
CLIP APPLIE 9.375 MED OPEN (MISCELLANEOUS) IMPLANT
CLIP VESOCCLUDE SM WIDE 6/CT (CLIP) IMPLANT
CLOSURE WOUND 1/2 X4 (GAUZE/BANDAGES/DRESSINGS) ×1
COVER BACK TABLE 60X90IN (DRAPES) ×3 IMPLANT
COVER MAYO STAND STRL (DRAPES) ×3 IMPLANT
COVER PROBE W GEL 5X96 (DRAPES) ×3 IMPLANT
COVER WAND RF STERILE (DRAPES) IMPLANT
DECANTER SPIKE VIAL GLASS SM (MISCELLANEOUS) IMPLANT
DERMABOND ADVANCED (GAUZE/BANDAGES/DRESSINGS) ×2
DERMABOND ADVANCED .7 DNX12 (GAUZE/BANDAGES/DRESSINGS) ×1 IMPLANT
DRAPE LAPAROSCOPIC ABDOMINAL (DRAPES) ×3 IMPLANT
DRAPE UTILITY XL STRL (DRAPES) ×3 IMPLANT
DRSG TEGADERM 4X4.75 (GAUZE/BANDAGES/DRESSINGS) IMPLANT
ELECT COATED BLADE 2.86 ST (ELECTRODE) ×3 IMPLANT
ELECT REM PT RETURN 9FT ADLT (ELECTROSURGICAL) ×3
ELECTRODE REM PT RTRN 9FT ADLT (ELECTROSURGICAL) ×1 IMPLANT
GAUZE SPONGE 4X4 12PLY STRL LF (GAUZE/BANDAGES/DRESSINGS) IMPLANT
GLOVE BIO SURGEON STRL SZ7 (GLOVE) ×6 IMPLANT
GLOVE BIO SURGEON STRL SZ7.5 (GLOVE) ×2 IMPLANT
GLOVE BIOGEL PI IND STRL 7.0 (GLOVE) IMPLANT
GLOVE BIOGEL PI IND STRL 7.5 (GLOVE) ×1 IMPLANT
GLOVE BIOGEL PI IND STRL 8 (GLOVE) IMPLANT
GLOVE BIOGEL PI INDICATOR 7.0 (GLOVE) ×2
GLOVE BIOGEL PI INDICATOR 7.5 (GLOVE) ×2
GLOVE BIOGEL PI INDICATOR 8 (GLOVE) ×2
GLOVE ECLIPSE 6.5 STRL STRAW (GLOVE) ×2 IMPLANT
GOWN STRL REUS W/ TWL LRG LVL3 (GOWN DISPOSABLE) ×2 IMPLANT
GOWN STRL REUS W/TWL LRG LVL3 (GOWN DISPOSABLE) ×6
HEMOSTAT ARISTA ABSORB 3G PWDR (HEMOSTASIS) IMPLANT
KIT MARKER MARGIN INK (KITS) ×3 IMPLANT
NDL HYPO 25X1 1.5 SAFETY (NEEDLE) ×1 IMPLANT
NEEDLE HYPO 25X1 1.5 SAFETY (NEEDLE) ×3 IMPLANT
NS IRRIG 1000ML POUR BTL (IV SOLUTION) IMPLANT
PACK BASIN DAY SURGERY FS (CUSTOM PROCEDURE TRAY) ×3 IMPLANT
PENCIL SMOKE EVACUATOR (MISCELLANEOUS) ×3 IMPLANT
RETRACTOR ONETRAX LX 90X20 (MISCELLANEOUS) IMPLANT
SLEEVE SCD COMPRESS KNEE MED (MISCELLANEOUS) ×3 IMPLANT
SPONGE LAP 4X18 RFD (DISPOSABLE) ×3 IMPLANT
STRIP CLOSURE SKIN 1/2X4 (GAUZE/BANDAGES/DRESSINGS) ×2 IMPLANT
SUT MNCRL AB 4-0 PS2 18 (SUTURE) ×3 IMPLANT
SUT MON AB 5-0 PS2 18 (SUTURE) IMPLANT
SUT SILK 2 0 SH (SUTURE) IMPLANT
SUT VIC AB 2-0 SH 27 (SUTURE) ×3
SUT VIC AB 2-0 SH 27XBRD (SUTURE) ×1 IMPLANT
SUT VIC AB 3-0 SH 27 (SUTURE) ×3
SUT VIC AB 3-0 SH 27X BRD (SUTURE) ×1 IMPLANT
SUT VIC AB 5-0 PS2 18 (SUTURE) ×2 IMPLANT
SYR CONTROL 10ML LL (SYRINGE) ×3 IMPLANT
TOWEL GREEN STERILE FF (TOWEL DISPOSABLE) ×3 IMPLANT
TRAY FAXITRON CT DISP (TRAY / TRAY PROCEDURE) ×3 IMPLANT
TUBE CONNECTING 20'X1/4 (TUBING) ×1
TUBE CONNECTING 20X1/4 (TUBING) ×1 IMPLANT
YANKAUER SUCT BULB TIP NO VENT (SUCTIONS) ×2 IMPLANT

## 2020-08-03 NOTE — Transfer of Care (Signed)
Immediate Anesthesia Transfer of Care Note  Patient: Misty Mahoney  Procedure(s) Performed: RIGHT BREAST EXCISIONAL BIOPSY WITH RADIOACTIVE SEED LOCALIZATION (Right Breast)  Patient Location: PACU  Anesthesia Type:General  Level of Consciousness: awake, alert  and patient cooperative  Airway & Oxygen Therapy: Patient Spontanous Breathing and Patient connected to face mask oxygen  Post-op Assessment: Report given to RN, Post -op Vital signs reviewed and stable, Patient moving all extremities X 4 and Patient able to stick tongue midline  Post vital signs: Reviewed and stable  Last Vitals:  Vitals Value Taken Time  BP 113/58 08/03/20 0818  Temp    Pulse 65 08/03/20 0822  Resp 21 08/03/20 0822  SpO2 99 % 08/03/20 0822  Vitals shown include unvalidated device data.  Last Pain:  Vitals:   08/03/20 0656  TempSrc: Oral  PainSc: 0-No pain         Complications: No complications documented.

## 2020-08-03 NOTE — Interval H&P Note (Signed)
History and Physical Interval Note:  08/03/2020 7:14 AM  Misty Mahoney  has presented today for surgery, with the diagnosis of RIGHT NIPPLE DISCHARGE AND MASS.  The various methods of treatment have been discussed with the patient and family. After consideration of risks, benefits and other options for treatment, the patient has consented to  Procedure(s): RIGHT BREAST LUMPECTOMY WITH RADIOACTIVE SEED LOCALIZATION (Right) as a surgical intervention.  The patient's history has been reviewed, patient examined, no change in status, stable for surgery.  I have reviewed the patient's chart and labs.  Questions were answered to the patient's satisfaction.     Rolm Bookbinder

## 2020-08-03 NOTE — Anesthesia Preprocedure Evaluation (Signed)
Anesthesia Evaluation  Patient identified by MRN, date of birth, ID band Patient awake    Reviewed: Allergy & Precautions, NPO status , Patient's Chart, lab work & pertinent test results  Airway Mallampati: II  TM Distance: <3 FB Neck ROM: Full    Dental no notable dental hx.    Pulmonary sleep apnea ,    Pulmonary exam normal breath sounds clear to auscultation       Cardiovascular hypertension, Pt. on medications Normal cardiovascular exam+ dysrhythmias Atrial Fibrillation  Rhythm:Regular Rate:Normal     Neuro/Psych negative neurological ROS  negative psych ROS   GI/Hepatic Neg liver ROS, GERD  ,  Endo/Other  diabetes, Type 2  Renal/GU negative Renal ROS  negative genitourinary   Musculoskeletal  (+) Arthritis , Osteoarthritis,    Abdominal (+) + obese,   Peds negative pediatric ROS (+)  Hematology negative hematology ROS (+)   Anesthesia Other Findings   Reproductive/Obstetrics negative OB ROS                             Anesthesia Physical  Anesthesia Plan  ASA: III  Anesthesia Plan: General   Post-op Pain Management:    Induction: Intravenous  PONV Risk Score and Plan: 3 and Ondansetron, Dexamethasone, Midazolam and Treatment may vary due to age or medical condition  Airway Management Planned: LMA  Additional Equipment:   Intra-op Plan:   Post-operative Plan: Extubation in OR  Informed Consent: I have reviewed the patients History and Physical, chart, labs and discussed the procedure including the risks, benefits and alternatives for the proposed anesthesia with the patient or authorized representative who has indicated his/her understanding and acceptance.     Dental advisory given  Plan Discussed with: CRNA and Surgeon  Anesthesia Plan Comments:         Anesthesia Quick Evaluation

## 2020-08-03 NOTE — Anesthesia Postprocedure Evaluation (Signed)
Anesthesia Post Note  Patient: Keysi Oelkers  Procedure(s) Performed: RIGHT BREAST EXCISIONAL BIOPSY WITH RADIOACTIVE SEED LOCALIZATION (Right Breast)     Patient location during evaluation: PACU Anesthesia Type: General Level of consciousness: awake and alert Pain management: pain level controlled Vital Signs Assessment: post-procedure vital signs reviewed and stable Respiratory status: spontaneous breathing, nonlabored ventilation and respiratory function stable Cardiovascular status: blood pressure returned to baseline and stable Postop Assessment: no apparent nausea or vomiting Anesthetic complications: no   No complications documented.  Last Vitals:  Vitals:   08/03/20 0845 08/03/20 0900  BP:  133/63  Pulse: 63 64  Resp: 14 18  Temp:  36.5 C  SpO2: 98% 97%    Last Pain:  Vitals:   08/03/20 0900  TempSrc:   PainSc: 0-No pain                 Lynda Rainwater

## 2020-08-03 NOTE — Op Note (Signed)
Preoperative diagnosis: right nipple dc, right retroareolar mass Postoperative diagnosis: saa Procedure: right seed guided excisional biopsy and central duct excision Surgeon: Dr Serita Grammes Anesthesia: general  EBL: minimal Complications none Drains none Specimens 1. Right breast tissue containing seed and clip marked with paint 2. Right breast central duct excision marked with paint Sponge and needle count correct dispo recovery stable  56 yof I saw in past for episode bloody discharge but wasn't having anything when I saw her.She does have a sister who passed from inflammatory cancer she states in 2017.she has noted more recently right sided clear spontaneous intermittent nipple dc. she was then reevaluated by mm and [place]. mm shows some dilated ducts and her US shows a 6 mm retroareolar mass present. nodes are normal. she underwent a core biopsy that shows an intraductal papilloma. she continues to have some dc. she is concerned also about her breast cancer risk in general as well. I sent her for an MRI that showed no abnormal nodes, no suspicious masses and biopsy clip artifact at site of biposy proven papilloma. she stil has some discharge.   Procedure: After informed consent was obtained patient was taken to the OR.  She had a seed placed prior to beginning.  She was given antibiotics and SCDs were in place.  She was placed under anesthesia and was prepped and draped in standard sterile surgical fashion.  A timeout was performed.  I located the seed. I then infiltrated marcaine around the nac and made a periareolar incision to hide the scar later.  I then removed the seed and the surrounding tissue. This was confirmed by mammography that I removed seed and clip.  I marked that with paint. I then removed the central ducts in region of discharge as well as there was another nodule there. This was marked with paint.  I obtained hemostasis. I then closed with 2-0 vicryl, 3-0 vicryl and  5-0 monocryl.  Glue and steristrips applied

## 2020-08-03 NOTE — Discharge Instructions (Signed)
No Ibuprofen until 1:20 today if needed.   [place] Office Phone Number [phone]  BREAST BIOPSY/ PARTIAL MASTECTOMY: POST OP INSTRUCTIONS Take 400 mg of ibuprofen every 8 hours or 650 mg tylenol every 6 hours for next 72 hours then as needed. Use ice several times daily also. Always review your discharge instruction sheet given to you by the facility where your surgery was performed.  IF YOU HAVE DISABILITY OR FAMILY LEAVE FORMS, YOU MUST BRING THEM TO THE OFFICE FOR PROCESSING.  DO NOT GIVE THEM TO YOUR DOCTOR.  1. A prescription for pain medication may be given to you upon discharge.  Take your pain medication as prescribed, if needed.  If narcotic pain medicine is not needed, then you may take acetaminophen (Tylenol), naprosyn (Alleve) or ibuprofen (Advil) as needed. 2. Take your usually prescribed medications unless otherwise directed 3. If you need a refill on your pain medication, please contact your pharmacy.  They will contact our office to request authorization.  Prescriptions will not be filled after 5pm or on week-ends. 4. You should eat very light the first 24 hours after surgery, such as soup, crackers, pudding, etc.  Resume your normal diet the day after surgery. 5. Most patients will experience some swelling and bruising in the breast.  Ice packs and a good support bra will help.  Wear the breast binder provided or a sports bra for 72 hours day and night.  After that wear a sports bra during the day until you return to the office. Swelling and bruising can take several days to resolve.  6. It is common to experience some constipation if taking pain medication after surgery.  Increasing fluid intake and taking a stool softener will usually help or prevent this problem from occurring.  A mild laxative (Milk of Magnesia or Miralax) should be taken according to package directions if there are no bowel movements after 48 hours. 7. Unless discharge instructions  indicate otherwise, you may remove your bandages 48 hours after surgery and you may shower at that time.  You may have steri-strips (small skin tapes) in place directly over the incision.  These strips should be left on the skin for 7-10 days and will come off on their own.  If your surgeon used skin glue on the incision, you may shower in 24 hours.  The glue will flake off over the next 2-3 weeks.  Any sutures or staples will be removed at the office during your follow-up visit. 8. ACTIVITIES:  You may resume regular daily activities (gradually increasing) beginning the next day.  Wearing a good support bra or sports bra minimizes pain and swelling.  You may have sexual intercourse when it is comfortable. a. You may drive when you no longer are taking prescription pain medication, you can comfortably wear a seatbelt, and you can safely maneuver your car and apply brakes. b. RETURN TO WORK:  ______________________________________________________________________________________ 9. You should see your doctor in the office for a follow-up appointment approximately two weeks after your surgery.  Your doctor's nurse will typically make your follow-up appointment when she calls you with your pathology report.  Expect your pathology report 3-4 business days after your surgery.  You may call to check if you do not hear from [place] after three days. 10. OTHER INSTRUCTIONS: _______________________________________________________________________________________________ _____________________________________________________________________________________________________________________________________ _____________________________________________________________________________________________________________________________________ _____________________________________________________________________________________________________________________________________  WHEN TO CALL DR WAKEFIELD: 1. Fever over 101.0 2. Nausea  and/or vomiting. 3. Extreme swelling or bruising. 4. Continued bleeding from incision. 5. Increased pain,  redness, or drainage from the incision.  The clinic staff is available to answer your questions during regular business hours.  Please don't hesitate to call and ask to speak to one of the nurses for clinical concerns.  If you have a medical emergency, go to the nearest emergency room or call 911.  A surgeon from [facility] is always on call at the hospital.  For further questions, please visit [email or web address]   Post Anesthesia Home Care Instructions  Activity: Get plenty of rest for the remainder of the day. A responsible individual must stay with you for 24 hours following the procedure.  For the next 24 hours, DO NOT: -Drive a car -Paediatric nurse -Drink alcoholic beverages -Take any medication unless instructed by your physician -Make any legal decisions or sign important papers.  Meals: Start with liquid foods such as gelatin or soup. Progress to regular foods as tolerated. Avoid greasy, spicy, heavy foods. If nausea and/or vomiting occur, drink only clear liquids until the nausea and/or vomiting subsides. Call your physician if vomiting continues.  Special Instructions/Symptoms: Your throat may feel dry or sore from the anesthesia or the breathing tube placed in your throat during surgery. If this causes discomfort, gargle with warm salt water. The discomfort should disappear within 24 hours.  If you had a scopolamine patch placed behind your ear for the management of post- operative nausea and/or vomiting:  1. The medication in the patch is effective for 72 hours, after which it should be removed.  Wrap patch in a tissue and discard in the trash. Wash hands thoroughly with soap and water. 2. You may remove the patch earlier than 72 hours if you experience unpleasant side effects which may include dry mouth, dizziness or visual disturbances. 3.  Avoid touching the patch. Wash your hands with soap and water after contact with the patch.

## 2020-08-03 NOTE — Anesthesia Procedure Notes (Signed)
Procedure Name: LMA Insertion Date/Time: 08/03/2020 7:34 AM Performed by: Collier Bullock, CRNA Pre-anesthesia Checklist: Patient identified, Emergency Drugs available, Suction available and Patient being monitored Patient Re-evaluated:Patient Re-evaluated prior to induction Oxygen Delivery Method: Circle system utilized Preoxygenation: Pre-oxygenation with 100% oxygen Induction Type: IV induction Ventilation: Mask ventilation without difficulty LMA: LMA inserted LMA Size: 4.0 Number of attempts: 1 Placement Confirmation: positive ETCO2 and breath sounds checked- equal and bilateral Tube secured with: Tape Dental Injury: Teeth and Oropharynx as per pre-operative assessment

## 2020-08-03 NOTE — H&P (Signed)
54 yof I saw in past for episode bloody discharge but wasn't having anything when I saw her. We elected to do follow up. she has done fine since then. she does have a sister who passed from inflammatory cancer she states in 2017. she also has a paternal GGF with breast cancer. she has been doing regular mm. she has noted more recently right sided clear spontaneous intermittent nipple dc. she was then reevaluated by mm and [place]. mm shows some dilated ducts and her US shows a 6 mm retroareolar mass present. nodes are normal. she underwent a core biopsy that shows an intraductal papilloma. she continues to have some dc. she is concerned also about her breast cancer risk in general as well. I sent her for an MRI that showed no abnormal nodes, no suspicious masses and biopsy clip artifact at site of biposy proven papilloma. she stil has some discharge.    Past Surgical History [facility] Teressa Senter, [place]; 05/18/2020 9:24 AM) Anal Fissure Repair  Breast Biopsy  Right. Colon Polyp Removal - Colonoscopy  Colon Polyp Removal - Open  Knee Surgery  Left.  Diagnostic Studies History (Chanel Teressa Senter, [place]; 05/18/2020 9:24 AM) Colonoscopy  1-5 years ago never Mammogram  within last year 1-3 years ago Pap Smear  1-5 years ago  Allergies (Chanel Teressa Senter, CMA; 05/18/2020 9:25 AM) No Known Drug Allergies  [05/25/2015]: Allergies Reconciled   Medication History (Chanel Teressa Senter, CMA; 05/18/2020 9:25 AM) Irbesartan (300MG  Tablet, Oral) Active. Vitamin D (Ergocalciferol) (1.25 MG(50000 UT) Capsule, Oral) Active. Pantoprazole Sodium (40MG  Tablet DR, Oral) Active. Escitalopram Oxalate (10MG  Tablet, Oral) Active. Tylenol Extra Strength (500MG  Tablet, Oral) Active. Cartia XT (180MG  Capsule ER 24HR, Oral) Active. Vitamin D (1000UNIT Capsule, Oral) Active. hydroCHLOROthiazide (25MG  Tablet, Oral) Active. Medications Reconciled  Social History Antonietta Jewel, CMA; 05/18/2020 9:24 AM) Alcohol use   Occasional alcohol use. Caffeine use  Coffee, Tea. No drug use  Tobacco use  Former smoker, Never smoker.  Family History Antonietta Jewel, [place]; 05/18/2020 9:24 AM) Alcohol Abuse  Brother. Arthritis  Father, Mother. Bleeding disorder  Sister. Cerebrovascular Accident  Mother. Colon Polyps  Father. Hypertension  Brother, Father, Mother.  Pregnancy / Birth History Antonietta Jewel, [place]; 05/18/2020 9:24 AM) Age at menarche  54 years. Age of menopause  69-55 Gravida  2 Length (months) of breastfeeding  3-6 Maternal age  34-30 Para  2  Other Problems (Chanel Teressa Senter, [place]; 05/18/2020 9:24 AM) Atrial Fibrillation  Gastroesophageal Reflux Disease  High blood pressure  Sleep Apnea    Review of Systems (Chanel Nolan CMA; 05/18/2020 9:24 AM) General Not Present- Appetite Loss, Chills, Fatigue, Fever, Night Sweats, Weight Gain and Weight Loss. HEENT Not Present- Earache, Hearing Loss, Hoarseness, Nose Bleed, Oral Ulcers, Ringing in the Ears, Seasonal Allergies, Sinus Pain, Sore Throat, Visual Disturbances, Wears glasses/contact lenses and Yellow Eyes. Cardiovascular Present- Palpitations. Not Present- Chest Pain, Difficulty Breathing Lying Down, Leg Cramps, Rapid Heart Rate, Shortness of Breath and Swelling of Extremities. Gastrointestinal Not Present- Abdominal Pain, Bloating, Bloody Stool, Change in Bowel Habits, Chronic diarrhea, Constipation, Difficulty Swallowing, Excessive gas, Gets full quickly at meals, Hemorrhoids, Indigestion, Nausea, Rectal Pain and Vomiting. Female Genitourinary Present- Urgency. Not Present- Frequency, Nocturia, Painful Urination and Pelvic Pain. Musculoskeletal Not Present- Back Pain, Joint Pain, Joint Stiffness, Muscle Pain, Muscle Weakness and Swelling of Extremities. Neurological Not Present- Decreased Memory, Fainting, Headaches, Numbness, Seizures, Tingling, Tremor, Trouble walking and Weakness. Psychiatric Not Present- Anxiety, Bipolar, Change in  Sleep Pattern, Depression, Fearful and Frequent crying. Endocrine Not  Present- Cold Intolerance, Excessive Hunger, Hair Changes, Heat Intolerance, Hot flashes and New Diabetes. Hematology Not Present- Blood Thinners, Easy Bruising, Excessive bleeding, Gland problems, HIV and Persistent Infections.  Vitals (Chanel Nolan CMA; 05/18/2020 9:25 AM) 05/18/2020 9:25 AM Weight: 256.5 lb Height: 68in Body Surface Area: 2.27 m Body Mass Index: 39 kg/m  Temp.: 95.63F  Pulse: 93 (Regular)  BP: 132/80(Sitting, Left Arm, Standard)   Physical Exam Rolm Bookbinder MD; 05/18/2020 10:59 AM)  General Mental Status-Alert. Orientation-Oriented X3.  Breast Nipples-No Discharge. Breast Lump-No Palpable Breast Mass.  Lymphatic Head & Neck  General Head & Neck Lymphatics: Bilateral - Description - Normal. Axillary  General Axillary Region: Bilateral - Description - Normal. Note: no Harrison adenopathy    Assessment & Plan Rolm Bookbinder MD; 05/18/2020 11:03 AM) NIPPLE DISCHARGE (N64.52) Story: right breast seed guided excisional biopsy I discussed that with continued discharge and this papilloma I think reasonable to excise this area at her risk level. It might change risk level and our conversation although I think this is likely benign. discussed surgery, risks and recovery. she would like to do later this summer or fall and i think that is reasonable

## 2020-08-05 LAB — SURGICAL PATHOLOGY

## 2020-08-09 ENCOUNTER — Encounter (HOSPITAL_BASED_OUTPATIENT_CLINIC_OR_DEPARTMENT_OTHER): Payer: Self-pay | Admitting: General Surgery

## 2020-08-23 DIAGNOSIS — Z803 Family history of malignant neoplasm of breast: Secondary | ICD-10-CM | POA: Diagnosis not present

## 2020-08-27 DIAGNOSIS — Z23 Encounter for immunization: Secondary | ICD-10-CM | POA: Diagnosis not present

## 2020-09-15 MED FILL — METFORMIN HCL 1000 MG TABS: 1000 | 90 days supply | Qty: 90 | Fill #0

## 2020-09-23 ENCOUNTER — Other Ambulatory Visit (HOSPITAL_COMMUNITY): Payer: Self-pay | Admitting: Family Medicine

## 2020-09-24 MED FILL — metFORMIN HCL ER 500 MG TB2: 500 | 90 days supply | Qty: 360 | Fill #0

## 2020-09-27 ENCOUNTER — Other Ambulatory Visit (HOSPITAL_COMMUNITY): Payer: Self-pay | Admitting: Family Medicine

## 2020-09-27 DIAGNOSIS — R7301 Impaired fasting glucose: Secondary | ICD-10-CM | POA: Diagnosis not present

## 2020-09-27 DIAGNOSIS — E559 Vitamin D deficiency, unspecified: Secondary | ICD-10-CM | POA: Diagnosis not present

## 2020-09-27 DIAGNOSIS — I4891 Unspecified atrial fibrillation: Secondary | ICD-10-CM | POA: Diagnosis not present

## 2020-09-27 DIAGNOSIS — E119 Type 2 diabetes mellitus without complications: Secondary | ICD-10-CM | POA: Diagnosis not present

## 2020-09-27 DIAGNOSIS — I10 Essential (primary) hypertension: Secondary | ICD-10-CM | POA: Diagnosis not present

## 2020-09-27 MED FILL — KETOCONAZOLE 2 % CREA: 2 | 20 days supply | Qty: 30 | Fill #0

## 2020-09-27 MED FILL — NALTREXONE 50 MG TABLET: 50 | 30 days supply | Qty: 30 | Fill #0

## 2020-09-27 MED FILL — PANTOPRAZOLE SOD DR 20 MG T: 20 | 90 days supply | Qty: 97 | Fill #0

## 2020-09-27 MED FILL — IRBESARTAN 300 MG TABS: 300 | 90 days supply | Qty: 90 | Fill #0

## 2020-09-27 MED FILL — ESCITALOPRAM 10 MG TABLET: 10 | 90 days supply | Qty: 90 | Fill #0

## 2020-09-27 MED FILL — DOXAZOSIN MESYLATE 2 MG TAB: 2 | 90 days supply | Qty: 90 | Fill #0

## 2020-09-27 MED FILL — DILT-XR 240 MG CAP SA: 240 | 90 days supply | Qty: 90 | Fill #0

## 2020-10-03 MED FILL — HYDROCHLOROTHIAZIDE 25 MG T: 25 | 90 days supply | Qty: 90 | Fill #3

## 2020-10-05 ENCOUNTER — Other Ambulatory Visit: Payer: Self-pay | Admitting: General Surgery

## 2020-10-05 DIAGNOSIS — Z9189 Other specified personal risk factors, not elsewhere classified: Secondary | ICD-10-CM

## 2020-10-08 ENCOUNTER — Other Ambulatory Visit: Payer: Self-pay | Admitting: General Surgery

## 2020-10-08 DIAGNOSIS — Z1231 Encounter for screening mammogram for malignant neoplasm of breast: Secondary | ICD-10-CM

## 2020-11-29 ENCOUNTER — Ambulatory Visit
Admission: RE | Admit: 2020-11-29 | Discharge: 2020-11-29 | Disposition: A | Discharge status: Home / Self Care | Payer: 59 | Source: Ambulatory Visit | Attending: General Surgery | Admitting: General Surgery

## 2020-11-29 DIAGNOSIS — Z1231 Encounter for screening mammogram for malignant neoplasm of breast: Secondary | ICD-10-CM

## 2020-11-29 DIAGNOSIS — Z01419 Encounter for gynecological examination (general) (routine) without abnormal findings: Secondary | ICD-10-CM | POA: Diagnosis not present

## 2020-11-29 DIAGNOSIS — N950 Postmenopausal bleeding: Secondary | ICD-10-CM | POA: Diagnosis not present

## 2020-11-29 DIAGNOSIS — Z124 Encounter for screening for malignant neoplasm of cervix: Secondary | ICD-10-CM | POA: Diagnosis not present

## 2020-11-30 ENCOUNTER — Other Ambulatory Visit: Payer: Self-pay | Admitting: General Surgery

## 2020-11-30 DIAGNOSIS — R928 Other abnormal and inconclusive findings on diagnostic imaging of breast: Secondary | ICD-10-CM

## 2020-12-01 ENCOUNTER — Other Ambulatory Visit: Payer: Self-pay | Admitting: Obstetrics and Gynecology

## 2020-12-01 DIAGNOSIS — N950 Postmenopausal bleeding: Secondary | ICD-10-CM

## 2020-12-13 ENCOUNTER — Ambulatory Visit: Payer: 59

## 2020-12-13 ENCOUNTER — Ambulatory Visit
Admission: RE | Admit: 2020-12-13 | Discharge: 2020-12-13 | Disposition: A | Discharge status: Home / Self Care | Payer: 59 | Source: Ambulatory Visit | Attending: General Surgery | Admitting: General Surgery

## 2020-12-13 ENCOUNTER — Other Ambulatory Visit: Payer: Self-pay

## 2020-12-13 DIAGNOSIS — R928 Other abnormal and inconclusive findings on diagnostic imaging of breast: Secondary | ICD-10-CM

## 2020-12-27 ENCOUNTER — Other Ambulatory Visit (HOSPITAL_COMMUNITY): Payer: Self-pay | Admitting: Family Medicine

## 2020-12-27 ENCOUNTER — Other Ambulatory Visit: Payer: 59

## 2020-12-27 DIAGNOSIS — E559 Vitamin D deficiency, unspecified: Secondary | ICD-10-CM | POA: Diagnosis not present

## 2020-12-27 DIAGNOSIS — I4891 Unspecified atrial fibrillation: Secondary | ICD-10-CM | POA: Diagnosis not present

## 2020-12-27 DIAGNOSIS — R7301 Impaired fasting glucose: Secondary | ICD-10-CM | POA: Diagnosis not present

## 2020-12-27 DIAGNOSIS — I10 Essential (primary) hypertension: Secondary | ICD-10-CM | POA: Diagnosis not present

## 2020-12-27 MED FILL — DOXAZOSIN MESYLATE 2 MG TAB: 2 | 90 days supply | Qty: 180 | Fill #0

## 2020-12-27 MED FILL — VALSARTAN 320 MG TABLET: 320 | 90 days supply | Qty: 90 | Fill #0

## 2020-12-30 ENCOUNTER — Ambulatory Visit
Admission: RE | Admit: 2020-12-30 | Discharge: 2020-12-30 | Disposition: A | Discharge status: Home / Self Care | Payer: 59 | Source: Ambulatory Visit | Attending: Obstetrics and Gynecology | Admitting: Obstetrics and Gynecology

## 2020-12-30 DIAGNOSIS — M25551 Pain in right hip: Secondary | ICD-10-CM | POA: Diagnosis not present

## 2020-12-30 DIAGNOSIS — N950 Postmenopausal bleeding: Secondary | ICD-10-CM

## 2020-12-30 DIAGNOSIS — N8500 Endometrial hyperplasia, unspecified: Secondary | ICD-10-CM | POA: Diagnosis not present

## 2020-12-31 MED FILL — ESCITALOPRAM 10 MG TABLET: 10 | 90 days supply | Qty: 90 | Fill #1

## 2021-01-03 DIAGNOSIS — M25551 Pain in right hip: Secondary | ICD-10-CM | POA: Diagnosis not present

## 2021-01-10 ENCOUNTER — Ambulatory Visit: Payer: 59 | Admitting: Internal Medicine

## 2021-01-10 DIAGNOSIS — N840 Polyp of corpus uteri: Secondary | ICD-10-CM | POA: Diagnosis not present

## 2021-01-10 DIAGNOSIS — N950 Postmenopausal bleeding: Secondary | ICD-10-CM | POA: Diagnosis not present

## 2021-01-10 DIAGNOSIS — M25551 Pain in right hip: Secondary | ICD-10-CM | POA: Diagnosis not present

## 2021-01-17 ENCOUNTER — Other Ambulatory Visit (HOSPITAL_COMMUNITY): Payer: Self-pay | Admitting: Family Medicine

## 2021-01-17 MED FILL — HYDROCHLOROTHIAZIDE 25 MG T: 25 | 90 days supply | Qty: 90 | Fill #0

## 2021-01-18 DIAGNOSIS — M25551 Pain in right hip: Secondary | ICD-10-CM | POA: Diagnosis not present

## 2021-01-20 ENCOUNTER — Other Ambulatory Visit (HOSPITAL_COMMUNITY): Payer: Self-pay | Admitting: Family Medicine

## 2021-01-20 MED FILL — DILT-XR 240 MG CAP SA: 240 | 90 days supply | Qty: 90 | Fill #0

## 2021-01-27 DIAGNOSIS — M25551 Pain in right hip: Secondary | ICD-10-CM | POA: Diagnosis not present

## 2021-01-31 DIAGNOSIS — M1712 Unilateral primary osteoarthritis, left knee: Secondary | ICD-10-CM | POA: Diagnosis not present

## 2021-01-31 DIAGNOSIS — M25562 Pain in left knee: Secondary | ICD-10-CM | POA: Diagnosis not present

## 2021-01-31 DIAGNOSIS — M25551 Pain in right hip: Secondary | ICD-10-CM | POA: Diagnosis not present

## 2021-02-09 DIAGNOSIS — M25551 Pain in right hip: Secondary | ICD-10-CM | POA: Diagnosis not present

## 2021-02-15 DIAGNOSIS — Z1152 Encounter for screening for COVID-19: Secondary | ICD-10-CM | POA: Diagnosis not present

## 2021-02-15 DIAGNOSIS — E119 Type 2 diabetes mellitus without complications: Secondary | ICD-10-CM | POA: Diagnosis not present

## 2021-02-15 DIAGNOSIS — I10 Essential (primary) hypertension: Secondary | ICD-10-CM | POA: Diagnosis not present

## 2021-02-15 DIAGNOSIS — K219 Gastro-esophageal reflux disease without esophagitis: Secondary | ICD-10-CM | POA: Diagnosis not present

## 2021-02-15 DIAGNOSIS — U071 COVID-19: Secondary | ICD-10-CM | POA: Diagnosis not present

## 2021-02-16 ENCOUNTER — Other Ambulatory Visit (HOSPITAL_COMMUNITY): Payer: Self-pay | Admitting: Family Medicine

## 2021-02-16 DIAGNOSIS — U071 COVID-19: Secondary | ICD-10-CM | POA: Diagnosis not present

## 2021-02-16 DIAGNOSIS — E119 Type 2 diabetes mellitus without complications: Secondary | ICD-10-CM | POA: Diagnosis not present

## 2021-02-16 DIAGNOSIS — J1282 Pneumonia due to coronavirus disease 2019: Secondary | ICD-10-CM | POA: Diagnosis not present

## 2021-02-16 DIAGNOSIS — I4891 Unspecified atrial fibrillation: Secondary | ICD-10-CM | POA: Diagnosis not present

## 2021-02-16 MED FILL — CARVEDILOL 3.125 MG TABLET: 3.125 | 90 days supply | Qty: 180 | Fill #0

## 2021-02-16 MED FILL — ALBUTEROL SULFATE HFA 108 (: 108 (90 BAS | 30 days supply | Qty: 18 | Fill #0

## 2021-02-16 MED FILL — predniSONE 10 MG TABS: 10 | 10 days supply | Qty: 10 | Fill #0

## 2021-02-16 MED FILL — AZITHROMYCIN 250 MG TABLET: 250 | 5 days supply | Qty: 6 | Fill #0

## 2021-02-28 ENCOUNTER — Other Ambulatory Visit: Payer: Self-pay

## 2021-02-28 ENCOUNTER — Ambulatory Visit: Payer: 59 | Admitting: Internal Medicine

## 2021-02-28 ENCOUNTER — Encounter: Payer: Self-pay | Admitting: Internal Medicine

## 2021-02-28 VITALS — BP 126/80 | HR 61 | Ht 68.0 in | Wt 261.0 lb

## 2021-02-28 DIAGNOSIS — I480 Paroxysmal atrial fibrillation: Secondary | ICD-10-CM

## 2021-02-28 DIAGNOSIS — M25551 Pain in right hip: Secondary | ICD-10-CM | POA: Diagnosis not present

## 2021-02-28 NOTE — Patient Instructions (Signed)
Medication Instructions: . Your physician recommends that you continue on your current medications as directed. Please refer to the Current Medication list given to you today.  *If you need a refill on your cardiac medications before your next appointment, please call your pharmacy*   Lab Work: None ordered.  If you have labs (blood work) drawn today and your tests are completely normal, you will receive your results only by: Marland Kitchen MyChart Message (if you have MyChart) OR . A paper copy in the mail If you have any lab test that is abnormal or we need to change your treatment, we will call you to review the results.   Testing/Procedures: None ordered.    Follow-Up: At [facility], you and your health needs are our priority.  As part of our continuing mission to provide you with exceptional heart care, we have created designated Provider Care Teams.  These Care Teams include your primary Cardiologist (physician) and Advanced Practice Providers (APPs -  Physician Assistants and Nurse Practitioners) who all work together to provide you with the care you need, when you need it.  We recommend signing up for the patient portal called "MyChart".  Sign up information is provided on this After Visit Summary.  MyChart is used to connect with patients for Virtual Visits (Telemedicine).  Patients are able to view lab/test results, encounter notes, upcoming appointments, etc.  Non-urgent messages can be sent to your provider as well.   To learn more about what you can do with MyChart, go to [email or web address].    Your next appointment:   12 month(s)  The format for your next appointment:   In Person  Provider:   You will see one of the following Advanced Practice Providers on your designated Care Team:    Chanetta Marshall, NP  Tommye Standard, PA-C  Legrand Como "[place]" [place], [place]

## 2021-02-28 NOTE — Progress Notes (Signed)
Patient Care Team: [name], MD as PCP - General (Family Medicine) Deboraha Sprang, MD as Consulting Physician (Cardiology) Thurman Coyer, DO as Consulting Physician (Sports Medicine) Erroll Luna, MD as Consulting Physician (General Surgery) Linus Mako, MD as Consulting Physician (Vascular Surgery) Allyn Kenner, DO as Consulting Physician (Obstetrics and Gynecology) [place], [name], [place] as Consulting Physician (Optometry)   HPI  Misty Mahoney is a 55 y.o. female seen in follow-up for paroxysmal atrial fibrillation identified a couple of years ago. This emerged following catheter ablation of AVNodal  [place]  CHADS-VASc score was 1 with a CHADS-VASc score of 2.  At our last visit she declined anticoagulation.  Has had difficult to manage blood pressure.  Most recently abetted with the addition of carvedilol.     Dyspnea at the top of stairs.  No bendopnea.  No edema.  Enjoying the less stressful new job  She has treated sleep apnea. DATE TEST    8/18 Echo    EF 55-60 %  LAD 36 mL/m2  8/1/8 CTC   Ca score 0        Thromboembolic risk factors , HTN-1, Gender-1) for a CHADSVASc Score of 2 hemoglobin A1c 5.72/20  Results Reviewed   Past Medical History:  Diagnosis Date  . Anemia    gestational  . Chicken pox as a child  . Depression 06/15/2015  . DM, gestational, diet controlled 04/10/2014   insulin resistant- A1C5.7   . GERD (gastroesophageal reflux disease)    undx'd GERD   . Hyperlipidemia 12/20/2015  . Hypertension   . Obesity, unspecified 04/10/2014  . OSA (obstructive sleep apnea)    uses cpap  . Osteoarthritis   . Paroxysmal atrial fibrillation (HCC)   . Prediabetes   . SVT (supraventricular tachycardia) ([place])    s/p RFCA 09/26/12  . SVT (supraventricular tachycardia) (HCC)    a. s/p slow pathway modification 2013 Dr Lovena Le  . Vitamin D deficiency 12/26/2015    Past Surgical History:  Procedure Laterality Date  .  ADENOIDECTOMY    . BREAST LUMPECTOMY WITH RADIOACTIVE SEED LOCALIZATION Right 08/03/2020   Procedure: RIGHT BREAST EXCISIONAL BIOPSY WITH RADIOACTIVE SEED LOCALIZATION;  Surgeon: Rolm Bookbinder, MD;  Location: [place];  Service: General;  Laterality: Right;  . ENDOVENOUS ABLATION SAPHENOUS VEIN W/ LASER Bilateral    Thermal ablation  . KNEE ARTHROSCOPY WITH MENISCAL REPAIR Left 08/25/2016   Procedure: KNEE ARTHROSCOPY WITH partial medial meniscectomy, chondroplasty;  Surgeon: [place] Nakayama, MD;  Location: [place];  Service: Orthopedics;  Laterality: Left;  KNEE ARTHROSCOPY WITH partial medial meniscectomy, chondroplasty  . PALATE / UVULA BIOPSY / EXCISION    . TONSILLECTOMY  55 yrs old  . V-TACH ABLATION N/A 09/26/2012   Procedure: V-TACH ABLATION;  Surgeon: Evans Lance, MD;  Location[facility];  Service: Cardiovascular;  Laterality: N/A;  . WISDOM TOOTH EXTRACTION  55 yrs old    Current Outpatient Medications  Medication Sig Dispense Refill  . albuterol (VENTOLIN HFA) 108 (90 Base) MCG/ACT inhaler INHALE 1 PUFF EVERY 4 HOURS AS NEEDED 18 g 0  . carvedilol (COREG) 3.125 MG tablet TAKE 1 TABLET BY MOUTH 2 TIMES DAILY WITH FOOD 180 tablet 0  . Cholecalciferol (VITAMIN D) 2000 units CAPS Take 1 capsule by mouth daily.    Marland Kitchen diltiazem (DILACOR XR) 240 MG 24 hr capsule TAKE 1 CAPSULE BY MOUTH ONCE DAILY 90 capsule 0  . doxazosin (CARDURA) 2 MG  tablet TAKE 2 TABLETS BY MOUTH DAILY 180 tablet 0  . escitalopram (LEXAPRO) 10 MG tablet TAKE 1 TABLET BY MOUTH ONCE DAILY 90 tablet 1  . FERROCITE 324 MG TABS tablet TAKE 1 TABLET BY MOUTH ONCE A DAY 100 tablet 3  . flecainide (TAMBOCOR) 150 MG tablet Take 2 tablets (300 mg total) by mouth as needed. 90 tablet 3  . hydrochlorothiazide (HYDRODIURIL) 25 MG tablet Take 1 tablet (25 mg total) by mouth daily. 90 tablet 3  . irbesartan (AVAPRO) 300 MG tablet Take 1 tablet (300 mg total) by mouth daily. 90 tablet 3  .  ketoconazole (NIZORAL) 2 % cream APPLY TO THE AFFECTED AREA(S) TWICE DAILY AS NEEDED 30 g 0  . Magnesium 250 MG TABS Take 1 tablet by mouth daily.    . metFORMIN (GLUCOPHAGE-XR) 500 MG 24 hr tablet TAKE 2 TABLETS BY MOUTH TWO TIMES DAILY (Patient taking differently: Take 100 mg by mouth at bedtime.) 120 tablet 2  . valsartan (DIOVAN) 320 MG tablet TAKE 1 TABLET BY MOUTH DAILY 90 tablet 0  . Vitamin D, Ergocalciferol, (DRISDOL) 1.25 MG (50000 UNIT) CAPS capsule TAKE 1 CAPSULE BY MOUTH ONCE A WEEK 12 capsule 1   No current facility-administered medications for this visit.    Allergies  Allergen Reactions  . Epinephrine     Hx of a fib  . Sunflower Oil Swelling    Seeds       Review of Systems negative except from HPI and PMH  Physical Exam BP 126/80 (BP Location: Left Arm, Patient Position: Sitting, Cuff Size: Normal)   Pulse 61   Ht 5\' 8"  (1.727 m)   Wt 261 lb (118.4 kg)   LMP 06/24/2018   SpO2 96%   BMI 39.68 kg/m  Well developed and nourished in no acute distress HENT normal Neck supple with JVP-8-10 cm Clear Regular rate and rhythm, no murmurs or gallops Abd-soft   No Clubbing cyanosis trace edema Skin-warm and dry A & Oriented  Grossly normal sensory and motor function  ECG sinus at 61 Interval 16/10/42    Assess ment and  Plan Atrial fibrillation-paroxysmal  Hypertension  Fatigue  Dyspnea on exertion  Blood pressure is much better controlled.  This with the addition of carvedilol.  I wonder whether there is an opportunity to back off on some of the other medications and have asked her to review this with Dr. Darron Doom as well as also with her to consider referral to the blood pressure clinic with Dr. Oval Linsey  Dyspnea on exertion in the context of normal LV function and volume overload would suggest HFpEF.  I have asked her to discuss with Dr. Darron Doom also whether the Metformin could be changed to an SGLT2 Will have encouraged increase sodium, decrease fluid  and efforts at weight loss  No interval atrial fibrillation, will continue as needed flecainide    .

## 2021-03-04 DIAGNOSIS — M25551 Pain in right hip: Secondary | ICD-10-CM | POA: Diagnosis not present

## 2021-03-08 DIAGNOSIS — M25551 Pain in right hip: Secondary | ICD-10-CM | POA: Diagnosis not present

## 2021-03-15 DIAGNOSIS — M25551 Pain in right hip: Secondary | ICD-10-CM | POA: Diagnosis not present

## 2021-03-16 ENCOUNTER — Other Ambulatory Visit (HOSPITAL_COMMUNITY): Payer: Self-pay

## 2021-03-16 MED ORDER — METFORMIN HCL ER 500 MG PO TB24
1000.0000 mg | ORAL_TABLET | Freq: Two times a day (BID) | ORAL | 2 refills | Status: DC
Start: 2021-03-16 — End: 2021-07-19
  Filled 2021-03-16: qty 360, 90d supply, fill #0

## 2021-03-16 MED ORDER — ESCITALOPRAM OXALATE 10 MG PO TABS
10.0000 mg | ORAL_TABLET | Freq: Every day | ORAL | 1 refills | Status: DC
Start: 2021-03-16 — End: 2021-09-06
  Filled 2021-03-16: qty 90, 90d supply, fill #0
  Filled 2021-06-17: qty 90, 90d supply, fill #1

## 2021-03-16 MED ORDER — DILTIAZEM HCL ER 240 MG PO CP24
240.0000 mg | ORAL_CAPSULE | Freq: Two times a day (BID) | ORAL | 0 refills | Status: DC
Start: 2021-03-16 — End: 2021-04-29
  Filled 2021-03-16: qty 6, 3d supply, fill #0
  Filled 2021-03-17: qty 84, 42d supply, fill #0

## 2021-03-17 ENCOUNTER — Other Ambulatory Visit (HOSPITAL_COMMUNITY): Payer: Self-pay

## 2021-03-18 ENCOUNTER — Other Ambulatory Visit (HOSPITAL_COMMUNITY): Payer: Self-pay

## 2021-03-21 DIAGNOSIS — M1712 Unilateral primary osteoarthritis, left knee: Secondary | ICD-10-CM | POA: Diagnosis not present

## 2021-03-21 DIAGNOSIS — M25551 Pain in right hip: Secondary | ICD-10-CM | POA: Diagnosis not present

## 2021-03-23 ENCOUNTER — Other Ambulatory Visit (HOSPITAL_COMMUNITY): Payer: Self-pay

## 2021-03-24 ENCOUNTER — Other Ambulatory Visit (HOSPITAL_COMMUNITY): Payer: Self-pay

## 2021-03-30 ENCOUNTER — Other Ambulatory Visit (HOSPITAL_COMMUNITY): Payer: Self-pay

## 2021-03-30 MED ORDER — HYDROCHLOROTHIAZIDE 25 MG PO TABS
ORAL_TABLET | ORAL | 0 refills | Status: DC
Start: 2021-03-30 — End: 2021-06-17
  Filled 2021-03-30: qty 90, 90d supply, fill #0

## 2021-03-30 MED ORDER — VALSARTAN 320 MG PO TABS
ORAL_TABLET | ORAL | 0 refills | Status: DC
Start: 2021-03-30 — End: 2021-06-22
  Filled 2021-03-30: qty 90, 90d supply, fill #0

## 2021-03-31 ENCOUNTER — Other Ambulatory Visit (HOSPITAL_COMMUNITY): Payer: Self-pay

## 2021-03-31 DIAGNOSIS — M25551 Pain in right hip: Secondary | ICD-10-CM | POA: Diagnosis not present

## 2021-04-01 ENCOUNTER — Other Ambulatory Visit (HOSPITAL_COMMUNITY): Payer: Self-pay

## 2021-04-04 DIAGNOSIS — I4891 Unspecified atrial fibrillation: Secondary | ICD-10-CM | POA: Diagnosis not present

## 2021-04-04 DIAGNOSIS — I10 Essential (primary) hypertension: Secondary | ICD-10-CM | POA: Diagnosis not present

## 2021-04-04 DIAGNOSIS — E539 Vitamin B deficiency, unspecified: Secondary | ICD-10-CM | POA: Diagnosis not present

## 2021-04-04 DIAGNOSIS — E559 Vitamin D deficiency, unspecified: Secondary | ICD-10-CM | POA: Diagnosis not present

## 2021-04-04 DIAGNOSIS — D501 Sideropenic dysphagia: Secondary | ICD-10-CM | POA: Diagnosis not present

## 2021-04-04 DIAGNOSIS — Z0001 Encounter for general adult medical examination with abnormal findings: Secondary | ICD-10-CM | POA: Diagnosis not present

## 2021-04-08 ENCOUNTER — Other Ambulatory Visit: Payer: Self-pay | Admitting: General Surgery

## 2021-04-08 ENCOUNTER — Other Ambulatory Visit (HOSPITAL_BASED_OUTPATIENT_CLINIC_OR_DEPARTMENT_OTHER): Payer: Self-pay

## 2021-04-08 DIAGNOSIS — Z9189 Other specified personal risk factors, not elsewhere classified: Secondary | ICD-10-CM

## 2021-04-12 ENCOUNTER — Ambulatory Visit: Payer: 59 | Admitting: Family Medicine

## 2021-04-13 ENCOUNTER — Ambulatory Visit: Payer: 59 | Admitting: Family Medicine

## 2021-04-13 ENCOUNTER — Other Ambulatory Visit: Payer: Self-pay

## 2021-04-13 ENCOUNTER — Encounter: Payer: Self-pay | Admitting: Family Medicine

## 2021-04-13 VITALS — BP 120/70 | HR 61 | Ht 68.0 in | Wt 259.0 lb

## 2021-04-13 DIAGNOSIS — M25551 Pain in right hip: Secondary | ICD-10-CM

## 2021-04-13 NOTE — Patient Instructions (Signed)
Thank you for coming in today.  Continue the home exercises as much as you can.   Next step is MRI.  Let me know and I can order it.   [facility] is fast at getting MRI.   Let me know.   I love the way you think about this. You did it right order.

## 2021-04-13 NOTE — Progress Notes (Signed)
   Rito Ehrlich, am serving as a Education administrator for Dr. Lynne Leader.  Subjective:    CC: Right hip/glute pain  HPI: Pt is a 55 y/o female c/o R hip/glute pain started in November . Pt was previously seen by Dr. Tamala Julian on 12/05/18 and was fitted for orthotics. Today, pt locates her pain to low right buttock pain. Been doing PT since February.   Radiates: no LE Numbness/tingling: no LE Weakness: adduction is weak on right side  Aggravates: going up hills or stairs or getting out of car Treatments tried: PT, stretches, ibuprofen if needed   Dx imaging: 11/01/16 T-spine & L-spine XR  09/08/10 Sacrum/coccyx XR  Pertinent review of Systems: No fevers or chills  Relevant historical information: Hypertension   Objective:    Vitals:   04/13/21 1410  BP: 120/70  Pulse: 61  SpO2: 98%   General: Well Developed, well nourished, and in no acute distress.   MSK: Right hip normal-appearing normal motion.  Tender palpation greater trochanter.  Hip abduction strength diminished 4/5.  External rotation strength is intact. Pain reproduced with resisted hip abduction. Normal gait. Leg lengths equal.  Lab and Radiology Results Patient had an x-ray right hip at [place] reportedly normal.   Impression and Recommendations:    Assessment and Plan: 55 y.o. female with right lateral hip pain due to hip abductor tendinopathy and trochanteric bursitis.  Patient is already had physical therapy and done reasonably well.  She still has some pain and Dr. Arneta Cliche.  Discussed options.  Next step for this could either be continued dedicated home exercise program, injection, or MRI.  After discussion of options patient elects for a real good trial of home exercise program to improve her hip abduction strength.  If this should fail I would recommend next step to be MRI followed potentially by injection.  She will let me know.  Recheck as needed.   Discussed warning signs or symptoms. Please  see discharge instructions. Patient expresses understanding.   The above documentation has been reviewed and is accurate and complete Lynne Leader, M.D.

## 2021-04-27 ENCOUNTER — Telehealth: Payer: Self-pay | Admitting: *Deleted

## 2021-04-27 NOTE — Telephone Encounter (Signed)
I called and s/w Amy Ferdinand Lango with [facility] in regards to clearance form faxed to our office. I stated to Amy, I just wanted to clarify if needing cardiac clearance as well as records to be sent to them for clearance. Amy, clarified yes, need cardiac clearance as well. Per Amy procedure is set for 04/29/21 though she will need to know by 12 noon tomorrow 6/9 the latest if pt has been cleared or if need to post pone surgery until July. I assured Amy that we will do everything we can to take care of the pt in a timely manner. I will have HIM Dept fax over requested notes.      [facility] Pre-operative Risk Assessment    Patient Name: Misty Mahoney  DOB: 02-Sep-1966  MRN: [id number]   HEARTCARE STAFF: - Please ensure there is not already an duplicate clearance open for this procedure. - Under Visit Info/Reason for Call, type in Other and utilize the format Clearance MM/DD/YY or Clearance TBD. Do not use dashes or single digits. - If request is for dental extraction, please clarify the # of teeth to be extracted. - If the patient is currently at the dentist's office, call Pre-Op APP to address. If the patient is not currently in the dentist office, please route to the Pre-Op pool  Request for surgical clearance:  1. What type of surgery is being performed? D&C HYSTEROSCOPY   2. When is this surgery scheduled? 04/29/21   3. What type of clearance is required (medical clearance vs. Pharmacy clearance to hold med vs. Both)? MEDICAL  4. Are there any medications that need to be held prior to surgery and how long? NONE LISTED    5. Practice name and name of physician performing surgery? [facility]; DR. Allyn Kenner, D.O.   6. What is the office phone number? [phone]   7.   What is the office fax number? [phone]  8.   Anesthesia type (None, local, MAC, general) ? CHOICE   Julaine Hua 04/27/2021, 3:47 PM   _________________________________________________________________   (provider comments below)

## 2021-04-28 NOTE — Telephone Encounter (Signed)
Contacted as part of the preoperative cardiac protocol.  She continues to do well and has no cardiac complaints at this time.

## 2021-04-28 NOTE — Telephone Encounter (Signed)
   Primary Cardiologist: Virl Axe, MD  Chart reviewed as part of pre-operative protocol coverage. Given past medical history and time since last visit, based on ACC/AHA guidelines, Misty Mahoney would be at acceptable risk for the planned procedure without further cardiovascular testing.   Patient was advised that if she develops new symptoms prior to surgery to contact our office to arrange a follow-up appointment.  She verbalized understanding.  I will route this recommendation to the requesting party via Epic fax function and remove from pre-op pool.  Please call with questions.  [name] Niels Cranshaw NP-C    04/28/2021, 8:12 AM [address] [address] Office [phone] Fax [phone]

## 2021-04-28 NOTE — Telephone Encounter (Signed)
Requesting office has requested the original form to be sent to them as well with clearance box checked off yes or no pt has been cleared. I have retrieved the original form from our HIM Dept as it was brought to them yesterday so that they could fax over the requesting records. I did check the box yes for clearance. HIM Dept will get the form scanned into the pt's chart shortly. Pre op provider has been made aware. I will fax these notes to requesting office.

## 2021-04-28 NOTE — Telephone Encounter (Signed)
    Misty Mahoney with [facility] calling, she they did receive clearance for pt but the check box for pt is being clearance is not check, she said if we can resend clearance and have the box check that pt is cleared by [place] and signed by a provider

## 2021-04-28 NOTE — Telephone Encounter (Signed)
Patient was returning call 

## 2021-04-29 ENCOUNTER — Other Ambulatory Visit (HOSPITAL_COMMUNITY): Payer: Self-pay

## 2021-04-29 DIAGNOSIS — N950 Postmenopausal bleeding: Secondary | ICD-10-CM | POA: Diagnosis not present

## 2021-04-29 DIAGNOSIS — N8500 Endometrial hyperplasia, unspecified: Secondary | ICD-10-CM | POA: Diagnosis not present

## 2021-04-29 DIAGNOSIS — D251 Intramural leiomyoma of uterus: Secondary | ICD-10-CM | POA: Diagnosis not present

## 2021-04-29 DIAGNOSIS — N840 Polyp of corpus uteri: Secondary | ICD-10-CM | POA: Diagnosis not present

## 2021-04-29 MED ORDER — DILTIAZEM HCL ER 240 MG PO CP24
ORAL_CAPSULE | ORAL | 1 refills | Status: DC
Start: 2021-04-29 — End: 2021-09-06
  Filled 2021-04-29: qty 90, 90d supply, fill #0
  Filled 2021-07-11: qty 90, 90d supply, fill #1

## 2021-04-29 MED ORDER — DILTIAZEM HCL ER 240 MG PO CP24
240.0000 mg | ORAL_CAPSULE | Freq: Two times a day (BID) | ORAL | 0 refills | Status: DC
Start: 2021-04-29 — End: 2021-07-19
  Filled 2021-04-29: qty 90, 45d supply, fill #0

## 2021-05-04 ENCOUNTER — Other Ambulatory Visit: Payer: Self-pay

## 2021-05-04 ENCOUNTER — Ambulatory Visit
Admission: RE | Admit: 2021-05-04 | Discharge: 2021-05-04 | Disposition: A | Discharge status: Home / Self Care | Payer: 59 | Source: Ambulatory Visit | Attending: General Surgery | Admitting: General Surgery

## 2021-05-04 DIAGNOSIS — N6489 Other specified disorders of breast: Secondary | ICD-10-CM | POA: Diagnosis not present

## 2021-05-04 DIAGNOSIS — Z9189 Other specified personal risk factors, not elsewhere classified: Secondary | ICD-10-CM

## 2021-05-04 MED ORDER — GADOBUTROL 1 MMOL/ML IV SOLN
10.0000 mL | Freq: Once | INTRAVENOUS | Status: AC | PRN
  Administered 2021-05-04: 14:00:00 10 mL via INTRAVENOUS

## 2021-05-09 ENCOUNTER — Encounter: Payer: Self-pay | Admitting: Dermatology

## 2021-05-09 ENCOUNTER — Other Ambulatory Visit: Payer: Self-pay

## 2021-05-09 ENCOUNTER — Ambulatory Visit: Payer: 59 | Admitting: Dermatology

## 2021-05-09 DIAGNOSIS — D1801 Hemangioma of skin and subcutaneous tissue: Secondary | ICD-10-CM | POA: Diagnosis not present

## 2021-05-09 DIAGNOSIS — Z1283 Encounter for screening for malignant neoplasm of skin: Secondary | ICD-10-CM

## 2021-05-09 DIAGNOSIS — Z87898 Personal history of other specified conditions: Secondary | ICD-10-CM

## 2021-05-09 DIAGNOSIS — Z86018 Personal history of other benign neoplasm: Secondary | ICD-10-CM | POA: Diagnosis not present

## 2021-05-09 DIAGNOSIS — D239 Other benign neoplasm of skin, unspecified: Secondary | ICD-10-CM

## 2021-05-09 DIAGNOSIS — D2372 Other benign neoplasm of skin of left lower limb, including hip: Secondary | ICD-10-CM | POA: Diagnosis not present

## 2021-05-09 DIAGNOSIS — L821 Other seborrheic keratosis: Secondary | ICD-10-CM | POA: Diagnosis not present

## 2021-05-14 ENCOUNTER — Encounter: Payer: Self-pay | Admitting: Dermatology

## 2021-05-14 NOTE — Progress Notes (Signed)
   Follow-Up Visit   Subjective  Misty Mahoney is a 55 y.o. female who presents for the following: Annual Exam (Left post upper arm, and scalp crusty lesion. Patient has h/o of moderate atypia ).  Annual skin examination, new spot left upper arm Location:  Duration:  Quality:  Associated Signs/Symptoms: Modifying Factors:  Severity:  Timing: Context:   Objective  Well appearing patient in no apparent distress; mood and affect are within normal limits. Pubic Suprapubic -moderate Right abdomen - moderate  No sign repigmentation  Left Lower Leg - Anterior Left shin: 5 mm firm dermal papule.  Noninflamed.  Historically stable.  Left Upper Arm - Posterior Left post upper arm: 5 mm brown flattopped textured papule  Left Abdomen (side) - Upper Multiple 66mm red smooth dermal papules  Torso - Posterior (Back) General skin examination: no atypical pigment lesions, no recurrent atypical moles.    A full examination was performed including scalp, head, eyes, ears, nose, lips, neck, chest, axillae, abdomen, back, buttocks, bilateral upper extremities, bilateral lower extremities, hands, feet, fingers, toes, fingernails, and toenails. All findings within normal limits unless otherwise noted below.  Areas beneath undergarments not fully examined.   Assessment & Plan    History of atypical nevus Pubic  Annual skin examination.  Patient encouraged to self examine twice annually.  Continued ultraviolet protection.  Dermatofibroma Left Lower Leg - Anterior  Leave if stable.  Seborrheic keratosis Left Upper Arm - Posterior  Leave if stable  Cherry angioma Left Abdomen (side) - Upper  No intervention necessary  Encounter for screening for malignant neoplasm of skin Torso - Posterior (Back)  Annual skin examination      I, Lavonna Monarch, MD, have reviewed all documentation for this visit.  The documentation on 05/14/21 for the exam, diagnosis, procedures,  and orders are all accurate and complete.

## 2021-06-07 ENCOUNTER — Other Ambulatory Visit (HOSPITAL_COMMUNITY): Payer: Self-pay

## 2021-06-07 MED ORDER — CARVEDILOL 3.125 MG PO TABS
ORAL_TABLET | ORAL | 0 refills | Status: DC
Start: 2021-06-07 — End: 2021-09-06
  Filled 2021-06-07: qty 180, 90d supply, fill #0

## 2021-06-14 ENCOUNTER — Other Ambulatory Visit (HOSPITAL_COMMUNITY): Payer: Self-pay

## 2021-06-17 ENCOUNTER — Other Ambulatory Visit (HOSPITAL_COMMUNITY): Payer: Self-pay

## 2021-06-17 MED ORDER — HYDROCHLOROTHIAZIDE 25 MG PO TABS
ORAL_TABLET | ORAL | 0 refills | Status: DC
Start: 2021-06-17 — End: 2021-08-18
  Filled 2021-06-17 – 2021-06-21 (×2): qty 90, 90d supply, fill #0

## 2021-06-18 ENCOUNTER — Other Ambulatory Visit (HOSPITAL_COMMUNITY): Payer: Self-pay

## 2021-06-20 ENCOUNTER — Other Ambulatory Visit (HOSPITAL_COMMUNITY): Payer: Self-pay

## 2021-06-21 ENCOUNTER — Other Ambulatory Visit (HOSPITAL_COMMUNITY): Payer: Self-pay

## 2021-06-22 ENCOUNTER — Other Ambulatory Visit (HOSPITAL_COMMUNITY): Payer: Self-pay

## 2021-06-22 MED ORDER — VALSARTAN 320 MG PO TABS
ORAL_TABLET | ORAL | 1 refills | Status: DC
  Filled 2021-06-22: qty 90, 90d supply, fill #0
  Filled 2021-10-04: qty 90, 90d supply, fill #1

## 2021-06-22 MED FILL — Doxazosin Mesylate Tab 2 MG: ORAL | 15 days supply | Qty: 30 | Fill #0 | Status: AC

## 2021-06-23 ENCOUNTER — Other Ambulatory Visit (HOSPITAL_COMMUNITY): Payer: Self-pay

## 2021-06-24 ENCOUNTER — Other Ambulatory Visit (HOSPITAL_COMMUNITY): Payer: Self-pay

## 2021-06-24 MED ORDER — DOXAZOSIN MESYLATE 2 MG PO TABS
ORAL_TABLET | ORAL | 0 refills | Status: DC
Start: 2021-06-24 — End: 2021-07-19
  Filled 2021-06-24 – 2021-07-14 (×2): qty 180, 90d supply, fill #0

## 2021-06-27 ENCOUNTER — Other Ambulatory Visit (HOSPITAL_COMMUNITY): Payer: Self-pay

## 2021-06-29 ENCOUNTER — Other Ambulatory Visit (HOSPITAL_COMMUNITY): Payer: Self-pay

## 2021-07-04 ENCOUNTER — Other Ambulatory Visit (HOSPITAL_COMMUNITY): Payer: Self-pay

## 2021-07-05 ENCOUNTER — Other Ambulatory Visit (HOSPITAL_COMMUNITY): Payer: Self-pay

## 2021-07-11 ENCOUNTER — Other Ambulatory Visit (HOSPITAL_COMMUNITY): Payer: Self-pay

## 2021-07-12 ENCOUNTER — Other Ambulatory Visit (HOSPITAL_COMMUNITY): Payer: Self-pay

## 2021-07-13 ENCOUNTER — Other Ambulatory Visit (HOSPITAL_COMMUNITY): Payer: Self-pay

## 2021-07-14 ENCOUNTER — Other Ambulatory Visit (HOSPITAL_COMMUNITY): Payer: Self-pay

## 2021-07-18 ENCOUNTER — Other Ambulatory Visit (HOSPITAL_COMMUNITY): Payer: Self-pay

## 2021-07-19 ENCOUNTER — Other Ambulatory Visit: Payer: Self-pay

## 2021-07-19 ENCOUNTER — Encounter: Payer: Self-pay | Admitting: *Deleted

## 2021-07-19 ENCOUNTER — Encounter (HOSPITAL_BASED_OUTPATIENT_CLINIC_OR_DEPARTMENT_OTHER): Payer: Self-pay | Admitting: Cardiovascular Disease

## 2021-07-19 ENCOUNTER — Ambulatory Visit (HOSPITAL_BASED_OUTPATIENT_CLINIC_OR_DEPARTMENT_OTHER): Payer: 59 | Admitting: Cardiovascular Disease

## 2021-07-19 ENCOUNTER — Other Ambulatory Visit (HOSPITAL_COMMUNITY): Payer: Self-pay

## 2021-07-19 DIAGNOSIS — E6609 Other obesity due to excess calories: Secondary | ICD-10-CM | POA: Diagnosis not present

## 2021-07-19 DIAGNOSIS — G4733 Obstructive sleep apnea (adult) (pediatric): Secondary | ICD-10-CM

## 2021-07-19 DIAGNOSIS — I471 Supraventricular tachycardia, unspecified: Secondary | ICD-10-CM

## 2021-07-19 DIAGNOSIS — I10 Essential (primary) hypertension: Secondary | ICD-10-CM | POA: Diagnosis not present

## 2021-07-19 DIAGNOSIS — D3501 Benign neoplasm of right adrenal gland: Secondary | ICD-10-CM | POA: Diagnosis not present

## 2021-07-19 DIAGNOSIS — R7303 Prediabetes: Secondary | ICD-10-CM | POA: Diagnosis not present

## 2021-07-19 DIAGNOSIS — Z006 Encounter for examination for normal comparison and control in clinical research program: Secondary | ICD-10-CM

## 2021-07-19 MED ORDER — SPIRONOLACTONE 25 MG PO TABS
25.0000 mg | ORAL_TABLET | Freq: Every day | ORAL | 3 refills | Status: DC
  Filled 2021-07-19 – 2021-08-04 (×2): qty 90, 90d supply, fill #0

## 2021-07-19 NOTE — Assessment & Plan Note (Signed)
She plans to enroll in the PREP program to the [facility].

## 2021-07-19 NOTE — Patient Instructions (Signed)
GO FOR THE FIRST SET OF LABS (RENIN/ALDOSTERONE LEVELS) HOLD YOUR VALSARTAN FOR 2 DAYS PRIOR  YOU WILL INCREASE YOUR DOXAZOSIN TO TWICE A DAY WHILE HOLDING VALSARTAN   ONCE YOU HAVE THE FIRST SET OF LABS DONE STOP THE DOXAZOSIN AND START SPIRONOLACTONE 25 MG DAILY  AFTER YOU HAVE BEEN ON THE SPIRONOLACTONE FOR 1 WEEK RETURN FOR THE SECOND SET (BMET) OF LABS    Testing/Procedures: Your physician has requested that you have a renal artery duplex. During this test, an ultrasound is used to evaluate blood flow to the kidneys. Allow one hour for this exam. Do not eat after midnight the day before and avoid carbonated beverages. Take your medications as you usually do.   Follow-Up: 08/19/2021 3:00 PM WITH PHARM D AT [place] will receive a phone call from the PREP exercise and nutrition program to schedule an initial assessment.  Special Instructions:   MONITOR YOUR BLOOD PRESSURE TWICE A DAY WITH MACHINE YOU HAVE BEEN GIVEN. LOG IN THE BOOK PROVIDED. BRING THE BOOK AND YOUR BLOOD PRESSURE MACHINE TO YOUR FOLLOW UP IN 1 MONTH   DASH Eating Plan DASH stands for "Dietary Approaches to Stop Hypertension." The DASH eating plan is a healthy eating plan that has been shown to reduce high blood pressure (hypertension). It may also reduce your risk for type 2 diabetes, heart disease, and stroke. The DASH eating plan may also help with weight loss. What are tips for following this plan?  General guidelines Avoid eating more than 2,300 mg (milligrams) of salt (sodium) a day. If you have hypertension, you may need to reduce your sodium intake to 1,500 mg a day. Limit alcohol intake to no more than 1 drink a day for nonpregnant women and 2 drinks a day for men. One drink equals 12 oz of beer, 5 oz of wine, or 1 oz of hard liquor. Work with your health care provider to maintain a healthy body weight or to lose weight. Ask what an ideal weight is for you. Get at least 30 minutes of exercise  that causes your heart to beat faster (aerobic exercise) most days of the week. Activities may include walking, swimming, or biking. Work with your health care provider or diet and nutrition specialist (dietitian) to adjust your eating plan to your individual calorie needs. Reading food labels  Check food labels for the amount of sodium per serving. Choose foods with less than 5 percent of the Daily Value of sodium. Generally, foods with less than 300 mg of sodium per serving fit into this eating plan. To find whole grains, look for the word "whole" as the first word in the ingredient list. Shopping Buy products labeled as "low-sodium" or "no salt added." Buy fresh foods. Avoid canned foods and premade or frozen meals. Cooking Avoid adding salt when cooking. Use salt-free seasonings or herbs instead of table salt or sea salt. Check with your health care provider or pharmacist before using salt substitutes. Do not fry foods. Cook foods using healthy methods such as baking, boiling, grilling, and broiling instead. Cook with heart-healthy oils, such as olive, canola, soybean, or sunflower oil. Meal planning Eat a balanced diet that includes: 5 or more servings of fruits and vegetables each day. At each meal, try to fill half of your plate with fruits and vegetables. Up to 6-8 servings of whole grains each day. Less than 6 oz of lean meat, poultry, or fish each day. A 3-oz serving of meat is  about the same size as a deck of cards. One egg equals 1 oz. 2 servings of low-fat dairy each day. A serving of nuts, seeds, or beans 5 times each week. Heart-healthy fats. Healthy fats called Omega-3 fatty acids are found in foods such as flaxseeds and coldwater fish, like sardines, salmon, and mackerel. Limit how much you eat of the following: Canned or prepackaged foods. Food that is high in trans fat, such as fried foods. Food that is high in saturated fat, such as fatty meat. Sweets, desserts, sugary  drinks, and other foods with added sugar. Full-fat dairy products. Do not salt foods before eating. Try to eat at least 2 vegetarian meals each week. Eat more home-cooked food and less restaurant, buffet, and fast food. When eating at a restaurant, ask that your food be prepared with less salt or no salt, if possible. What foods are recommended? The items listed may not be a complete list. Talk with your dietitian about what dietary choices are best for you. Grains Whole-grain or whole-wheat bread. Whole-grain or whole-wheat pasta. Brown rice. [place]row. Bulgur. Whole-grain and low-sodium cereals. Pita bread. Low-fat, low-sodium crackers. Whole-wheat flour tortillas. Vegetables Fresh or frozen vegetables (raw, steamed, roasted, or grilled). Low-sodium or reduced-sodium tomato and vegetable juice. Low-sodium or reduced-sodium tomato sauce and tomato paste. Low-sodium or reduced-sodium canned vegetables. Fruits All fresh, dried, or frozen fruit. Canned fruit in natural juice (without added sugar). Meat and other protein foods Skinless chicken or [place]. Ground chicken or [place]. Pork with fat trimmed off. Fish and seafood. Egg whites. Dried beans, peas, or lentils. Unsalted nuts, nut butters, and seeds. Unsalted canned beans. Lean cuts of beef with fat trimmed off. Low-sodium, lean deli meat. Dairy Low-fat (1%) or fat-free (skim) milk. Fat-free, low-fat, or reduced-fat cheeses. Nonfat, low-sodium ricotta or cottage cheese. Low-fat or nonfat yogurt. Low-fat, low-sodium cheese. Fats and oils Soft margarine without trans fats. Vegetable oil. Low-fat, reduced-fat, or light mayonnaise and salad dressings (reduced-sodium). Canola, safflower, olive, soybean, and sunflower oils. Avocado. Seasoning and other foods Herbs. Spices. Seasoning mixes without salt. Unsalted popcorn and pretzels. Fat-free sweets. What foods are not recommended? The items listed may not be a complete list. Talk with your  dietitian about what dietary choices are best for you. Grains Baked goods made with fat, such as croissants, muffins, or some breads. Dry pasta or rice meal packs. Vegetables Creamed or fried vegetables. Vegetables in a cheese sauce. Regular canned vegetables (not low-sodium or reduced-sodium). Regular canned tomato sauce and paste (not low-sodium or reduced-sodium). Regular tomato and vegetable juice (not low-sodium or reduced-sodium). Angie Fava. Olives. Fruits Canned fruit in a light or heavy syrup. Fried fruit. Fruit in cream or butter sauce. Meat and other protein foods Fatty cuts of meat. Ribs. Fried meat. Berniece Salines. Sausage. Bologna and other processed lunch meats. Salami. Fatback. Hotdogs. Bratwurst. Salted nuts and seeds. Canned beans with added salt. Canned or smoked fish. Whole eggs or egg yolks. Chicken or [place] with skin. Dairy Whole or 2% milk, cream, and half-and-half. Whole or full-fat cream cheese. Whole-fat or sweetened yogurt. Full-fat cheese. Nondairy creamers. Whipped toppings. Processed cheese and cheese spreads. Fats and oils Butter. Stick margarine. Lard. Shortening. Ghee. Bacon fat. Tropical oils, such as coconut, palm kernel, or palm oil. Seasoning and other foods Salted popcorn and pretzels. Onion salt, garlic salt, seasoned salt, table salt, and sea salt. Worcestershire sauce. Tartar sauce. Barbecue sauce. Teriyaki sauce. Soy sauce, including reduced-sodium. Steak sauce. Canned and packaged gravies. Fish sauce. Oyster sauce.  Cocktail sauce. Horseradish that you find on the shelf. Ketchup. Mustard. Meat flavorings and tenderizers. Bouillon cubes. Hot sauce and Tabasco sauce. Premade or packaged marinades. Premade or packaged taco seasonings. Relishes. Regular salad dressings. Where to find more information: National Heart, Lung, and [place]: https://wilson-eaton.com/ American Heart Association: www.heart.org Summary The DASH eating plan is a healthy eating plan that has been  shown to reduce high blood pressure (hypertension). It may also reduce your risk for type 2 diabetes, heart disease, and stroke. With the DASH eating plan, you should limit salt (sodium) intake to 2,300 mg a day. If you have hypertension, you may need to reduce your sodium intake to 1,500 mg a day. When on the DASH eating plan, aim to eat more fresh fruits and vegetables, whole grains, lean proteins, low-fat dairy, and heart-healthy fats. Work with your health care provider or diet and nutrition specialist (dietitian) to adjust your eating plan to your individual calorie needs. This information is not intended to replace advice given to you by your health care provider. Make sure you discuss any questions you have with your health care provider. Document Released: 10/26/2011 Document Revised: 10/19/2017 Document Reviewed: 10/30/2016 Elsevier Patient Education  2020 [employer].

## 2021-07-19 NOTE — Assessment & Plan Note (Signed)
She previously had a work-up that was negative for cortisol and metanephrines/catecholamines.  She notes that she did not hold her ARB prior to checking renin and aldosterone.  We will do that now.

## 2021-07-19 NOTE — Assessment & Plan Note (Signed)
Continue CPAP.  She uses it regularly. 

## 2021-07-19 NOTE — Assessment & Plan Note (Signed)
Currently on metformin.  Working on diet and exercise as well as weight loss as above.  We will consider adding an SGLT2 inhibitor.

## 2021-07-19 NOTE — Assessment & Plan Note (Signed)
Resistant hypertension given that she is on >3 meds and BP is not controlled.  She has a known adrenal adenoma.  Testing was negative for pheochromocytoma and hyperalodsteronism.  However, she was on an ARB and it was not held.  We will have her hold valsartan for two days and repeat renin and aldosterone levels.  While she is holding the losartan she will increase doxazosin to 4 mg twice a day.  After she gets her labs drawn she will stop the doxazosin and add spironolactone 25 mg daily.  She will need a basic metabolic panel 1 week later.  We will get renal artery Dopplers.  She previously just had a renal ultrasound.  She is interested in enrolling in the PR EP program through the [facility].  She also wants to participate in our remote patient monitoring study.  She consents to monitoring in the vivify program.

## 2021-07-19 NOTE — Progress Notes (Signed)
Advanced Hypertension Clinic Initial Assessment:    Date:  07/19/2021   ID:  Misty Mahoney, Alferd Apa 03-03-1966, MRN [id number]  PCP:  [name], MD  Cardiologist:  Virl Axe, MD  Nephrologist:  Referring MD: [name], MD   CC: Hypertension  History of Present Illness:    Misty Mahoney is a 55 y.o. female with a hx of anemia, depression, diabetes, GERD, hyperlipidemia, hypertension, obesity, OSA on CPAP, paroxysmal atrial fibrillation, and SVT s/p RCFA, here to establish care in the Advanced Hypertension Clinic. In the past her blood pressure has been difficult to control. Most recently carvedilol was added and her blood pressure has been stable. She had an Echo in 2018 that revealed LVEF 60-65% with grade 2 diastolic dysfunction.  Today, she is feeling good overall. Right after the delivery of her now 92 year old son, her blood pressure spiked. Since then, it was always elevated. She has been on hypertension medication for 15+ years. Her blood pressure does not seem to able to lower below [id number] systolic. At home her blood pressure may be as high as the 160s. She confirms a heart rate in the 60s is baseline for her. While caring for her parents and working full-time, she had episodes of atrial fibrillation. Now, she takes flecainide as needed, which will resolve her Afib within an hour. Typically she exercises two days a week, treading in the pool or walking around the neighborhood for 30 minutes. Generally she feels well while exercising. Climbing one flight of stairs is fine, but during the second flight of stairs she begins to feel short-winded and have pain/burning sensations in her lower extremities. She recognizes that she is deconditioned. 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 Lately her LE edema seems to be improving, but she reports having periods of worsening edema that usually dissipates by the next day. For her diet, she mostly  cooks at home and does not freely add salt. However, she notes that her diet probably includes too much salt. Alcohol consumption is rare, usually 1-2 drinks once a month. Current supplements include Magnesium, Vitamin D, and a probiotic. For pain management, she prefers Ibuprofen twice a week which resolves her hip pain. Sometimes she uses tylenol 1000 mg but this is not nearly as effective. At night she regularly uses her CPAP. Her labs six months ago showed GFR  92, Creatine  0.73, Potassium  4.1, TSH  1.1, A1C  6, Cholesterol  189, and LDL  109. Last year she began Ozempic and lost 10 pounds, but this was discontinued after she developed abdominal pain. Of note, she is considered high risk for breast cancer, and it has been recommended that she start tamoxifen. She denies any chest pain, lightheadedness, headaches, syncope, orthopnea, or PND.   Previous antihypertensives: Lisinopril - Severe Cough Irbesartan - Ineffective   Past Medical History:  Diagnosis Date   Anemia    gestational   Atypical mole 07/24/2012   right abdomen moderate   Atypical mole 07/10/2018   right supra pubic moderate   Chicken pox as a child   Depression 06/15/2015   DM, gestational, diet controlled 04/10/2014   insulin resistant- A1C5.7    GERD (gastroesophageal reflux disease)    undx'd GERD    Hyperlipidemia 12/20/2015   Hypertension    Obesity, unspecified 04/10/2014   OSA (obstructive sleep apnea)    uses cpap   Osteoarthritis    Paroxysmal atrial fibrillation ([place])  Prediabetes    SVT (supraventricular tachycardia) ([place])    s/p RFCA 09/26/12   SVT (supraventricular tachycardia) ([place])    a. s/p slow pathway modification 2013 Dr Lovena Le   Vitamin D deficiency 12/26/2015    Past Surgical History:  Procedure Laterality Date   ADENOIDECTOMY     BREAST LUMPECTOMY WITH RADIOACTIVE SEED LOCALIZATION Right 08/03/2020   Procedure: RIGHT BREAST EXCISIONAL BIOPSY WITH RADIOACTIVE SEED LOCALIZATION;   Surgeon: Rolm Bookbinder, MD;  Location: [facility];  Service: General;  Laterality: Right;   ENDOVENOUS ABLATION SAPHENOUS VEIN W/ LASER Bilateral    Thermal ablation   KNEE ARTHROSCOPY WITH MENISCAL REPAIR Left 08/25/2016   Procedure: KNEE ARTHROSCOPY WITH partial medial meniscectomy, chondroplasty;  Surgeon: Melrose Nakayama, MD;  Location: [facility];  Service: Orthopedics;  Laterality: Left;  KNEE ARTHROSCOPY WITH partial medial meniscectomy, chondroplasty   PALATE / UVULA BIOPSY / EXCISION     TONSILLECTOMY  55 yrs old   V-TACH ABLATION N/A 09/26/2012   Procedure: V-TACH ABLATION;  Surgeon: Evans Lance, MD;  Location: [facility] CATH LAB;  Service: Cardiovascular;  Laterality: N/A;   WISDOM TOOTH EXTRACTION  55 yrs old    Current Medications: Current Meds  Medication Sig   carvedilol (COREG) 3.125 MG tablet Take 1 tablet (3.125 mg) by mouth 2 times per day with food   Cholecalciferol (VITAMIN D) 2000 units CAPS Take 1 capsule by mouth daily.   diltiazem (DILACOR XR) 240 MG 24 hr capsule Take 1 capsule (240 mg) by mouth daily   doxazosin (CARDURA) 2 MG tablet TAKE 2 TABLETS BY MOUTH DAILY   escitalopram (LEXAPRO) 10 MG tablet Take 1 tablet (10 mg total) by mouth daily.   flecainide (TAMBOCOR) 150 MG tablet Take 2 tablets (300 mg total) by mouth as needed.   hydrochlorothiazide (HYDRODIURIL) 25 MG tablet Take 1 tablet (25 mg) by mouth once daily   Magnesium 250 MG TABS Take 1 tablet by mouth daily.   metFORMIN (GLUCOPHAGE-XR) 500 MG 24 hr tablet Take 1,000 mg by mouth at bedtime.   valsartan (DIOVAN) 320 MG tablet Take 1 tablet by mouth daily     Allergies:   Epinephrine, Lisinopril, and Sunflower oil   Social History   Socioeconomic History   Marital status: Significant Other    Spouse name: [name]   Number of children: 2   Years of education: Not on file   Highest education level: Not on file  Occupational History   Occupation: RN  Tobacco Use    Smoking status: Never   Smokeless tobacco: Never  Vaping Use   Vaping Use: Never used  Substance and Sexual Activity   Alcohol use: Yes    Comment: two or three times a year   Drug use: No   Sexual activity: Yes    Partners: Male    Comment: lives with kids, significant other and step daughter. minimizing dairy and gluten, exercise  Other Topics Concern   Not on file  Social History Narrative   Not on file   Social Determinants of Health   Financial Resource Strain: Low Risk    Difficulty of Paying Living Expenses: Not hard at all  Food Insecurity: No Food Insecurity   Worried About Charity fundraiser in the Last Year: Never true   [place] in the Last Year: Never true  Transportation Needs: No Transportation Needs   Lack of Transportation (Medical): No   Lack of Transportation (Non-Medical): No  Physical Activity: Insufficiently Active   Days of Exercise per Week: 2 days   Minutes of Exercise per Session: 30 min  Stress: No Stress Concern Present   Feeling of Stress : Not at all  Social Connections: Not on file     Family History: The patient's family history includes ADD / ADHD in her daughter and son; Alzheimer's disease in her father; Atrial fibrillation in her father and mother; Breast cancer (age of onset: 46) in her sister; Cancer in her maternal grandfather and paternal grandmother; Cancer (age of onset: 58) in her sister; Cataracts in her father; Colon cancer in her maternal grandmother; Colon polyps in her father; Dementia in her father and mother; Heart attack in her maternal grandfather; Hypertension in her brother, father, and mother; Stroke in her mother. There is no history of Esophageal cancer, Rectal cancer, or Stomach cancer.  ROS:   Please see the history of present illness.    (+) Exertional Shortness of breath (+) Exertional Bilateral LE pain/burning sensations (+) Bilateral LE edema (+) Hip pain All other systems reviewed and are  negative.  EKGs/Labs/Other Studies Reviewed:    TTE 07/10/2017: - Left ventricle: The cavity size was normal. Wall thickness was    normal. Systolic function was normal. The estimated ejection    fraction was in the range of 60% to 65%. Wall motion was normal;    there were no regional wall motion abnormalities. Features are    consistent with a pseudonormal left ventricular filling pattern,    with concomitant abnormal relaxation and increased filling    pressure (grade 2 diastolic dysfunction).  -------------------------------------------------------------------  Labs, prior tests, procedures, and surgery:  Transthoracic echocardiography (08/23/2015).     EF was 65% and PA  pressure was 35 (systolic).    EKG:   07/19/2021: Sinus rhythm. Rate 67 bpm. LVH  Recent Labs: 07/30/2020: BUN 27; Creatinine, Ser 0.91; Potassium 3.4; Sodium 140   Recent Lipid Panel    Component Value Date/Time   CHOL 175 07/17/2019 0900   CHOL 177 08/22/2018 1015   TRIG 93.0 07/17/2019 0900   HDL 57.90 07/17/2019 0900   HDL 55 08/22/2018 1015   CHOLHDL 3 07/17/2019 0900   VLDL 18.6 07/17/2019 0900   LDLCALC 99 07/17/2019 0900   LDLCALC 100 (H) 08/22/2018 1015   03/2021: Total cholesterol 189, triglycerides 86, HDL 63, LDL 109 Creatinine 0.7, BUN 20, potassium 4.1 TSH 1.1 Hemoglobin A1c 6.0%.  Physical Exam:   VS:  BP 138/78 (BP Location: Left Arm, Patient Position: Sitting)   Pulse 67   Ht 5' 8.5" (1.74 m)   Wt 255 lb 12.8 oz (116 kg)   LMP 06/24/2018   BMI 38.33 kg/m  , BMI Body mass index is 38.33 kg/m. GENERAL:  Well appearing HEENT: Pupils equal round and reactive, fundi not visualized, oral mucosa unremarkable NECK:  No jugular venous distention, waveform within normal limits, carotid upstroke brisk and symmetric, no bruit LUNGS:  Clear to auscultation bilaterally HEART:  RRR.  PMI not displaced or sustained,S1 and S2 within normal limits, no S3, no S4, no clicks, no rubs, no  murmurs ABD:  Flat, positive bowel sounds normal in frequency in pitch, no bruits, no rebound, no guarding, no midline pulsatile mass, no hepatomegaly, no splenomegaly EXT:  2 plus pulses throughout, no edema, no cyanosis no clubbing SKIN:  No rashes no nodules NEURO:  Cranial nerves II through XII grossly intact, motor grossly intact throughout PSYCH:  Cognitively intact, oriented to person  place and time   ASSESSMENT/PLAN:    Hypertension Resistant hypertension given that she is on >3 meds and BP is not controlled.  She has a known adrenal adenoma.  Testing was negative for pheochromocytoma and hyperalodsteronism.  However, she was on an ARB and it was not held.  We will have her hold valsartan for two days and repeat renin and aldosterone levels.  While she is holding the losartan she will increase doxazosin to 4 mg twice a day.  After she gets her labs drawn she will stop the doxazosin and add spironolactone 25 mg daily.  She will need a basic metabolic panel 1 week later.  We will get renal artery Dopplers.  She previously just had a renal ultrasound.  She is interested in enrolling in the PR EP program through the [facility].  She also wants to participate in our remote patient monitoring study.  She consents to monitoring in the vivify program.  OSA (obstructive sleep apnea) Continue CPAP.  She uses it regularly.  SVT (supraventricular tachycardia) Well-controlled on diltiazem and carvedilol.  Adrenal adenoma She previously had a work-up that was negative for cortisol and metanephrines/catecholamines.  She notes that she did not hold her ARB prior to checking renin and aldosterone.  We will do that now.  Obesity She plans to enroll in the PREP program to the [facility].   Screening for Secondary Hypertension:  Causes 07/19/2021  Drugs/Herbals Screened     - Comments NSAIDS twice per week  Renovascular HTN Screened     - Comments Check renal artery Dopplers  Sleep Apnea Screened     -  Comments on CPAP  Thyroid Disease Screened     - Comments repeat TSH  Hyperaldosteronism Screened     - Comments check renal artery Dopplers.  Pheochromocytoma Screened     - Comments Negative in 2019.  No symtpoms.  Cushing's Syndrome Screened     - Comments cortisol normal    Relevant Labs/Studies: Basic Labs Latest Ref Rng & Units 07/30/2020 07/17/2019 01/09/2019  Sodium 135 - 145 mmol/L 140 143 143  Potassium 3.5 - 5.1 mmol/L 3.4(L) 4.3 4.0  Creatinine 0.44 - 1.00 mg/dL 0.91 0.81 0.65    Thyroid  Latest Ref Rng & Units 07/17/2019 03/05/2018  TSH 0.35 - 4.50 uIU/mL 1.39 1.900         Cortisol Latest Ref Rng & Units 12/13/2017  Cortisol  ug/dL 4.2          Disposition:    FU with PharmD in 1 month.  FU with [place] C. Oval Linsey, MD, [facility] in 4 months.   Medication Adjustments/Labs and Tests Ordered: Current medicines are reviewed at length with the patient today.  Concerns regarding medicines are outlined above.  No orders of the defined types were placed in this encounter.  No orders of the defined types were placed in this encounter.  I,Mathew Stumpf,acting as a Education administrator for Skeet Latch, MD.,have documented all relevant documentation on the behalf of Skeet Latch, MD,as directed by  Skeet Latch, MD while in the presence of Skeet Latch, MD.  I, [place] Oval Linsey, MD have reviewed all documentation for this visit.  The documentation of the exam, diagnosis, procedures, and orders on 07/19/2021 are all accurate and complete.  Time spent: 45 minutes-Greater than 50% of this time was spent in counseling, explanation of diagnosis, planning of further management, and coordination of care.    Signed, Skeet Latch, MD  07/19/2021 9:48 AM    [facility]

## 2021-07-19 NOTE — Assessment & Plan Note (Signed)
Well-controlled on diltiazem and carvedilol.

## 2021-07-19 NOTE — Research (Signed)
Misty Mahoney met criteria for our Virtual care and social determinant interventions for the management of hypertension study. The study was discussed with her including risk/benefits. She was given time to read the consent and ask questions. The consent was signed and she was given a copy. No study related procedures were performed prior to signing the consent. She was randomized to Group 1

## 2021-07-22 ENCOUNTER — Telehealth: Payer: Self-pay

## 2021-07-22 NOTE — Telephone Encounter (Signed)
Called to discuss PREP program referral, left voicemail  

## 2021-07-26 ENCOUNTER — Telehealth: Payer: Self-pay

## 2021-07-26 NOTE — Telephone Encounter (Signed)
Received return call from [place] re: [place] program; she wants to participate, works days so most likely will need [place] evening class; will update [facility] and keep her on call back list as she also wants to know [place] scheduled classes for October; I will contact her when the schedule is set.

## 2021-07-28 ENCOUNTER — Other Ambulatory Visit (HOSPITAL_COMMUNITY): Payer: Self-pay

## 2021-08-01 ENCOUNTER — Other Ambulatory Visit (HOSPITAL_COMMUNITY): Payer: Self-pay

## 2021-08-01 MED ORDER — DOXAZOSIN MESYLATE 2 MG PO TABS
ORAL_TABLET | ORAL | 0 refills | Status: DC
  Filled 2021-08-01: qty 180, 90d supply, fill #0

## 2021-08-04 ENCOUNTER — Other Ambulatory Visit (HOSPITAL_COMMUNITY): Payer: Self-pay

## 2021-08-05 ENCOUNTER — Ambulatory Visit (INDEPENDENT_AMBULATORY_CARE_PROVIDER_SITE_OTHER): Payer: 59

## 2021-08-05 ENCOUNTER — Other Ambulatory Visit: Payer: Self-pay

## 2021-08-05 ENCOUNTER — Encounter (HOSPITAL_BASED_OUTPATIENT_CLINIC_OR_DEPARTMENT_OTHER): Payer: Self-pay

## 2021-08-05 DIAGNOSIS — I10 Essential (primary) hypertension: Secondary | ICD-10-CM

## 2021-08-08 NOTE — Telephone Encounter (Signed)
Dr Oval Linsey spoke with patient regarding medication

## 2021-08-10 ENCOUNTER — Telehealth: Payer: Self-pay | Admitting: Cardiovascular Disease

## 2021-08-10 ENCOUNTER — Other Ambulatory Visit: Payer: Self-pay | Admitting: *Deleted

## 2021-08-10 NOTE — Telephone Encounter (Signed)
Referral has been done per Alvina Filbert LPN and staff message sent to [facility]  Pt aware./cy

## 2021-08-10 NOTE — Telephone Encounter (Signed)
Pt states she was seen by Dr. Oval Linsey on 07/19/21 and  was advised to have a renal artery duplex and she have not received a call to schedule. Pt also states that she would rather Dr. Oval Linsey refer her to Dr. Gwenlyn Found because that's her husband's cardiologist. Please advise pt further [phone]

## 2021-08-12 DIAGNOSIS — I10 Essential (primary) hypertension: Secondary | ICD-10-CM | POA: Diagnosis not present

## 2021-08-13 LAB — BASIC METABOLIC PANEL
BUN/Creatinine Ratio: 30 — ABNORMAL HIGH (ref 9–23)
BUN: 29 mg/dL — ABNORMAL HIGH (ref 6–24)
CO2: 23 mmol/L (ref 20–29)
Calcium: 9.3 mg/dL (ref 8.7–10.2)
Chloride: 102 mmol/L (ref 96–106)
Creatinine, Ser: 0.97 mg/dL (ref 0.57–1.00)
Glucose: 88 mg/dL (ref 65–99)
Potassium: 4.1 mmol/L (ref 3.5–5.2)
Sodium: 143 mmol/L (ref 134–144)
eGFR: 69 mL/min/{1.73_m2} (ref >59)

## 2021-08-17 LAB — ALDOSTERONE + RENIN ACTIVITY W/ RATIO
ALDOS/RENIN RATIO: 14.4 (ref 0.0–30.0)
ALDOSTERONE: 4.5 ng/dL (ref 0.0–30.0)
Renin: 0.312 ng/mL/hr (ref 0.167–5.380)

## 2021-08-18 ENCOUNTER — Ambulatory Visit: Payer: 59 | Admitting: Pharmacist Clinician (PhC)/ Clinical Pharmacy Specialist

## 2021-08-18 ENCOUNTER — Encounter: Payer: Self-pay | Admitting: Pharmacist Clinician (PhC)/ Clinical Pharmacy Specialist

## 2021-08-18 ENCOUNTER — Other Ambulatory Visit: Payer: Self-pay

## 2021-08-18 ENCOUNTER — Other Ambulatory Visit (HOSPITAL_COMMUNITY): Payer: Self-pay

## 2021-08-18 VITALS — BP 128/68 | HR 61 | Resp 15 | Ht 68.5 in | Wt 251.8 lb

## 2021-08-18 DIAGNOSIS — I10 Essential (primary) hypertension: Secondary | ICD-10-CM | POA: Diagnosis not present

## 2021-08-18 MED ORDER — CHLORTHALIDONE 25 MG PO TABS
25.0000 mg | ORAL_TABLET | Freq: Every day | ORAL | 3 refills | Status: DC
  Filled 2021-08-18: qty 30, 30d supply, fill #0
  Filled 2021-09-14: qty 30, 30d supply, fill #1
  Filled 2021-10-09: qty 30, 30d supply, fill #2
  Filled 2021-11-12: qty 30, 30d supply, fill #3

## 2021-08-18 NOTE — Patient Instructions (Signed)
Return for a a follow up appointment October 25 at 3 pm  (see Dr. Gwenlyn Found at 2:15 pm same day)  Go to the lab in 2 weeks to check kidney function (week of October 10)  Take your BP meds as follows:  Stop hydrochlorothiazide  Start chlorthalidone 25 mg once daily  Continue with all other medications    Bring all of your meds, your BP cuff and your record of home blood pressures to your next appointment.  Exercise as you're able, try to walk approximately 30 minutes per day.  Keep salt intake to a minimum, especially watch canned and prepared boxed foods.  Eat more fresh fruits and vegetables and fewer canned items.  Avoid eating in fast food restaurants.    HOW TO TAKE YOUR BLOOD PRESSURE: Rest 5 minutes before taking your blood pressure.  Don't smoke or drink caffeinated beverages for at least 30 minutes before. Take your blood pressure before (not after) you eat. Sit comfortably with your back supported and both feet on the floor (don't cross your legs). Elevate your arm to heart level on a table or a desk. Use the proper sized cuff. It should fit smoothly and snugly around your bare upper arm. There should be enough room to slip a fingertip under the cuff. The bottom edge of the cuff should be 1 inch above the crease of the elbow. Ideally, take 3 measurements at one sitting and record the average.

## 2021-08-18 NOTE — Assessment & Plan Note (Signed)
Patient with essential hypertension as well as right renal artery stenosis.  Follow up scheduled with Dr. Gwenlyn Found for RAS.  Although reading in office is good, home readings show significantly higher.  Will have her switch out hydrochlorothiazide for chlorthalidone 25 mg once daily.  She will need to repeat metabolic panel in 2 weeks.  She was advised to stay well hydrated for the next few weeks, as she is concerned that her renal function is worsening, not sure if due to dehydration at last draw.  I will see her again in one month for follow up.

## 2021-08-18 NOTE — Progress Notes (Signed)
08/18/2021 Misty Mahoney 1966/05/08 [id number]   HPI:  Misty Mahoney is a 55 y.o. female patient of Dr Oval Linsey, with a PMH below who presents today for hypertension clinic evaluation.  She was seen last month and noted she had had hypertension for 20+ years, since the birth of her son, but has only been on medications to treat it for the past 15 years.  Before seeing Dr. Oval Linsey she was started on carvedilol, which seems to have helped, although she notes home readings still 562-563'S systolic.    Today she returns for follow up.  She notes feeling tired "all the time" and not sure if due to medications, health in general or deconditioning.  She has scheduled herself with a new MD who will work more on root causes of her health issues rather than just treating symptoms.  Recent renal artery dopplers did show her right renal artery to be > 60% blocked, and she will be following up with Dr. Gwenlyn Found next month.     Past Medical History: AF Uses prn fleccainide, CHADS2-VASc score 2 (HTN, female), declined anticoagulation at this time  DM2 Last noted 2020 at 6.2 on metformin  CHF Grade 2 diastolic dysfunction, EF 93-73%.    OSA On CPAP     Blood Pressure Goal:  130/80  Current Medications: spironolactone 25 mg qd, hctz 25 mg qd, carvedilol 3.125 bid, valsartan 320 mg qd,  diltiazem 240 mg qd  Family Hx: both parents with hypertension, mother with stroke at 79, died 2 months later form pneumonia; father with alzehimers'; brother older with hypertension better controlled, sister died breast cancer, 2 children w/o hypertension  Social Hx: no, rare alcohol, gave up caffeine 2 months ago - still rare cup on the weekends  Diet: home cooked, rare salt with cooking; nothing deep fried; all vegetables  Exercise: 2 days per week go to pool for laps/tread, walks 1 mile  Home BP readings: WA cuff, did not bring with her today for callibration  14 am readings average 144/69 (HR  63)  range 137-155/54/72  11 pm readings average 145/71 (HR 70)  range 136-156/60-77  Intolerances: lisinopirl - cough  Labs: 08/12/21:  Na 143, K 4.1, Glu 88, BUN 29, SCr 0.97 GFR 69   Wt Readings from Last 3 Encounters:  08/18/21 251 lb 12.8 oz (114.2 kg)  07/19/21 255 lb 12.8 oz (116 kg)  04/13/21 259 lb (117.5 kg)   BP Readings from Last 3 Encounters:  08/18/21 128/68  07/19/21 138/78  04/13/21 120/70   Pulse Readings from Last 3 Encounters:  08/18/21 61  07/19/21 67  04/13/21 61    Current Outpatient Medications  Medication Sig Dispense Refill   carvedilol (COREG) 3.125 MG tablet Take 1 tablet (3.125 mg) by mouth 2 times per day with food 180 tablet 0   chlorthalidone (HYGROTON) 25 MG tablet Take 1 tablet (25 mg total) by mouth daily. 30 tablet 3   Cholecalciferol (VITAMIN D) 2000 units CAPS Take 1 capsule by mouth daily.     diltiazem (DILACOR XR) 240 MG 24 hr capsule Take 1 capsule (240 mg) by mouth daily 90 capsule 1   escitalopram (LEXAPRO) 10 MG tablet Take 1 tablet (10 mg total) by mouth daily. 90 tablet 1   flecainide (TAMBOCOR) 150 MG tablet Take 2 tablets (300 mg total) by mouth as needed. 90 tablet 3   Magnesium 250 MG TABS Take 1 tablet by mouth daily.     metFORMIN (  GLUCOPHAGE-XR) 500 MG 24 hr tablet Take 1,000 mg by mouth at bedtime.     spironolactone (ALDACTONE) 25 MG tablet Take 1 tablet (25 mg total) by mouth daily. 90 tablet 3   valsartan (DIOVAN) 320 MG tablet Take 1 tablet by mouth daily 90 tablet 1   No current facility-administered medications for this visit.    Allergies  Allergen Reactions   Epinephrine     Hx of a fib   Lisinopril Cough   Sunflower Oil Swelling    Seeds     Past Medical History:  Diagnosis Date   Anemia    gestational   Atypical mole 07/24/2012   right abdomen moderate   Atypical mole 07/10/2018   right supra pubic moderate   Chicken pox as a child   Depression 06/15/2015   DM, gestational, diet controlled  04/10/2014   insulin resistant- A1C5.7    GERD (gastroesophageal reflux disease)    undx'd GERD    Hyperlipidemia 12/20/2015   Hypertension    Obesity, unspecified 04/10/2014   OSA (obstructive sleep apnea)    uses cpap   Osteoarthritis    Paroxysmal atrial fibrillation ([place])    Prediabetes    SVT (supraventricular tachycardia) ([place])    s/p RFCA 09/26/12   SVT (supraventricular tachycardia) ([place])    a. s/p slow pathway modification 2013 Dr Lovena Le   Vitamin D deficiency 12/26/2015   Screening for Secondary Hypertension:   Causes 07/19/2021 08/18/2021  Drugs/Herbals Screened -     - Comments NSAIDS twice per week -  Renovascular HTN Screened Screened     - Comments Check renal artery Dopplers right RAS, has appt with Dr. Gwenlyn Found in October  Sleep Apnea Screened -     - Comments on CPAP -  Thyroid Disease Screened -     - Comments repeat TSH -  Hyperaldosteronism Screened Screened     - Comments check renal artery Dopplers. WNL  Pheochromocytoma Screened -     - Comments Negative in 2019.  No symtpoms. -  Cushing's Syndrome Screened -     - Comments cortisol normal -    Relevant Labs/Studies: Basic Labs Latest Ref Rng & Units 08/12/2021 07/30/2020 07/17/2019  Sodium 134 - 144 mmol/L 143 140 143  Potassium 3.5 - 5.2 mmol/L 4.1 3.4(L) 4.3  Creatinine 0.57 - 1.00 mg/dL 0.97 0.91 0.81    Thyroid  Latest Ref Rng & Units 07/17/2019 03/05/2018  TSH 0.35 - 4.50 uIU/mL 1.39 1.900    Renin/Aldosterone  Latest Ref Rng & Units 08/05/2021 02/14/2018  Aldosterone 0.0 - 30.0 ng/dL 4.5 6  Renin 0.167 - 5.380 ng/mL/hr 0.312 -  Aldos/Renin Ratio 0.0 - 30.0 14.4 -       Cortisol Latest Ref Rng & Units 12/13/2017  Cortisol  ug/dL 4.2    Renovascular  08/05/2021  Renal Artery [place] Completed Yes     Blood pressure 128/68, pulse 61, resp. rate 15, height 5' 8.5" (1.74 m), weight 251 lb 12.8 oz (114.2 kg), last menstrual period 06/24/2018, SpO2 98 %.  Hypertension Patient with essential hypertension  as well as right renal artery stenosis.  Follow up scheduled with Dr. Gwenlyn Found for RAS.  Although reading in office is good, home readings show significantly higher.  Will have her switch out hydrochlorothiazide for chlorthalidone 25 mg once daily.  She will need to repeat metabolic panel in 2 weeks.  She was advised to stay well hydrated for the next few weeks, as she is concerned that her  renal function is worsening, not sure if due to dehydration at last draw.  I will see her again in one month for follow up.     Tommy Medal PharmD CPP [facility] [address] [place], [place] [address] [phone]

## 2021-08-19 ENCOUNTER — Other Ambulatory Visit (HOSPITAL_COMMUNITY): Payer: Self-pay

## 2021-08-19 ENCOUNTER — Ambulatory Visit: Payer: 59

## 2021-08-23 ENCOUNTER — Telehealth: Payer: Self-pay

## 2021-08-23 NOTE — Telephone Encounter (Signed)
Received return call, currently she is only able to attend PREP classes in the evening. Will refer to Pam, RN [employer] to contact her for next evening classes at the [facility].

## 2021-08-23 NOTE — Telephone Encounter (Signed)
Left voicemail, calling to discuss PREP October schedule for next classes beginning at [facility]

## 2021-09-01 ENCOUNTER — Other Ambulatory Visit: Payer: Self-pay | Admitting: Family Medicine

## 2021-09-01 DIAGNOSIS — M25559 Pain in unspecified hip: Secondary | ICD-10-CM

## 2021-09-06 ENCOUNTER — Other Ambulatory Visit (HOSPITAL_COMMUNITY): Payer: Self-pay

## 2021-09-06 MED ORDER — CARVEDILOL 3.125 MG PO TABS
ORAL_TABLET | ORAL | 0 refills | Status: DC
  Filled 2021-09-06: qty 180, 90d supply, fill #0

## 2021-09-06 MED ORDER — DILTIAZEM HCL ER 240 MG PO CP24
ORAL_CAPSULE | ORAL | 20 refills | Status: DC
  Filled 2021-09-06: qty 90, fill #0
  Filled 2022-01-05: qty 90, 90d supply, fill #0

## 2021-09-06 MED ORDER — ESCITALOPRAM OXALATE 10 MG PO TABS
10.0000 mg | ORAL_TABLET | Freq: Every day | ORAL | 0 refills | Status: DC
  Filled 2021-09-06: qty 90, 90d supply, fill #0

## 2021-09-08 ENCOUNTER — Other Ambulatory Visit (HOSPITAL_COMMUNITY): Payer: Self-pay

## 2021-09-08 MED ORDER — SILVER SULFADIAZINE 1 % EX CREA
TOPICAL_CREAM | CUTANEOUS | 0 refills | Status: DC
  Filled 2021-09-08: qty 50, 15d supply, fill #0

## 2021-09-13 ENCOUNTER — Encounter: Payer: Self-pay | Admitting: Cardiovascular Disease

## 2021-09-13 ENCOUNTER — Ambulatory Visit (INDEPENDENT_AMBULATORY_CARE_PROVIDER_SITE_OTHER): Payer: 59 | Admitting: Pharmacist Clinician (PhC)/ Clinical Pharmacy Specialist

## 2021-09-13 ENCOUNTER — Other Ambulatory Visit: Payer: Self-pay

## 2021-09-13 ENCOUNTER — Ambulatory Visit: Payer: 59 | Admitting: Cardiovascular Disease

## 2021-09-13 VITALS — BP 114/74 | HR 61 | Ht 68.5 in | Wt 251.0 lb

## 2021-09-13 DIAGNOSIS — I10 Essential (primary) hypertension: Secondary | ICD-10-CM

## 2021-09-13 DIAGNOSIS — E6609 Other obesity due to excess calories: Secondary | ICD-10-CM | POA: Diagnosis not present

## 2021-09-13 DIAGNOSIS — R0600 Dyspnea, unspecified: Secondary | ICD-10-CM | POA: Diagnosis not present

## 2021-09-13 NOTE — Assessment & Plan Note (Signed)
For evaluation of potential renal vascular hypertension.  She apparently became hypertensive after the birth of her 56 year old child.  She is on multiple antihypertensive medications.  She did have renal Doppler studies performed 08/05/2021 which suggested FMD of the right renal artery.  She is seeing a functional medicine doctor as well as a weight loss physician, hopefully with dietary and lifestyle modification, weight loss she may be able to come off of some of her blood pressure medications which may also be contributing to her fatigue.  I am going to get a abdominal CTA to further evaluate her renal arteries.

## 2021-09-13 NOTE — Progress Notes (Signed)
09/13/2021 Misty Mahoney   May 07, 1966  [id number]  Primary Physician Hilts, Legrand Como, MD Primary Cardiologist: Lorretta Harp MD Garret Reddish, [place], [place]  HPI:  Misty Mahoney is a 55 y.o. moderately overweight single Caucasian female who has been living with her partner for 10 years.  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  She was referred by Dr. Oval Linsey, her cardiologist, for resistant hypertension.  She does have a history of treated hypertension hyperlipidemia.  There is no family history of heart disease.  She is never had a heart attack or stroke.  She does complain of some dyspnea on exertion.  She has obstructive sleep apnea on CPAP.  She had renal Doppler studies performed 08/05/2021 that suggested the possibility of right renal artery fibromuscular dysplasia.  She has had hyper Aldo ruled out as well.   Current Meds  Medication Sig   carvedilol (COREG) 3.125 MG tablet Take 1 tablet by mouth with food 2 times per day   chlorthalidone (HYGROTON) 25 MG tablet Take 1 tablet (25 mg total) by mouth daily.   Cholecalciferol (VITAMIN D) 2000 units CAPS Take 1 capsule by mouth daily.   diltiazem (DILT-XR) 240 MG 24 hr capsule Take 1 capsule by mouth daily   escitalopram (LEXAPRO) 10 MG tablet Take 1 tablet by mouth daily.   Magnesium 250 MG TABS Take 1 tablet by mouth daily.   metFORMIN (GLUCOPHAGE-XR) 500 MG 24 hr tablet Take 1,000 mg by mouth at bedtime.   silver sulfADIAZINE (SILVADENE) 1 % cream Apply once to twice daily to wound until healed   spironolactone (ALDACTONE) 25 MG tablet Take 1 tablet (25 mg total) by mouth daily.   valsartan (DIOVAN) 320 MG tablet Take 1 tablet by mouth daily     Allergies  Allergen Reactions   Epinephrine     Hx of a fib   Lisinopril Cough   Sunflower Oil Swelling    Seeds     Social History   Socioeconomic History   Marital status: Significant Other    Spouse name:  [name]   Number of children: 2   Years of education: Not on file   Highest education level: Not on file  Occupational History   Occupation: RN  Tobacco Use   Smoking status: Never   Smokeless tobacco: Never  Vaping Use   Vaping Use: Never used  Substance and Sexual Activity   Alcohol use: Yes    Comment: two or three times a year   Drug use: No   Sexual activity: Yes    Partners: Male    Comment: lives with kids, significant other and step daughter. minimizing dairy and gluten, exercise  Other Topics Concern   Not on file  Social History Narrative   Not on file   Social Determinants of Health   Financial Resource Strain: Low Risk    Difficulty of Paying Living Expenses: Not hard at all  Food Insecurity: No Food Insecurity   Worried About Charity fundraiser in the Last Year: Never true   [place] in the Last Year: Never true  Transportation Needs: No Transportation Needs   Lack of Transportation (Medical): No   Lack of Transportation (Non-Medical): No  Physical Activity: Insufficiently Active   Days of Exercise per Week: 2 days   Minutes of Exercise per Session: 30 min  Stress: No Stress Concern Present  Feeling of Stress : Not at all  Social Connections: Not on file  Intimate Partner Violence: Not on file     Review of Systems: General: negative for chills, fever, night sweats or weight changes.  Cardiovascular: negative for chest pain, dyspnea on exertion, edema, orthopnea, palpitations, paroxysmal nocturnal dyspnea or shortness of breath Dermatological: negative for rash Respiratory: negative for cough or wheezing Urologic: negative for hematuria Abdominal: negative for nausea, vomiting, diarrhea, bright red blood per rectum, melena, or hematemesis Neurologic: negative for visual changes, syncope, or dizziness All other systems reviewed and are otherwise negative except as noted above.    Blood pressure 114/74, pulse 61, height 5' 8.5" (1.74 m),  weight 251 lb (113.9 kg), last menstrual period 06/24/2018.  General appearance: alert and no distress Neck: no adenopathy, no carotid bruit, no JVD, supple, symmetrical, trachea midline, and thyroid not enlarged, symmetric, no tenderness/mass/nodules Lungs: clear to auscultation bilaterally Heart: regular rate and rhythm, S1, S2 normal, no murmur, click, rub or gallop Extremities: extremities normal, atraumatic, no cyanosis or edema Pulses: 2+ and symmetric Skin: Skin color, texture, turgor normal. No rashes or lesions Neurologic: Grossly normal  EKG sinus rhythm at 61 with evidence of LVH by voltage.  I personally reviewed this EKG.  ASSESSMENT AND PLAN:   Hypertension For evaluation of potential renal vascular hypertension.  She apparently became hypertensive after the birth of her 29 year old child.  She is on multiple antihypertensive medications.  She did have renal Doppler studies performed 08/05/2021 which suggested FMD of the right renal artery.  She is seeing a functional medicine doctor as well as a weight loss physician, hopefully with dietary and lifestyle modification, weight loss she may be able to come off of some of her blood pressure medications which may also be contributing to her fatigue.  I am going to get a abdominal CTA to further evaluate her renal arteries.     Lorretta Harp MD FACP,FACC,FAHA, [facility] 09/13/2021 3:28 PM

## 2021-09-13 NOTE — Patient Instructions (Signed)
Medication Instructions:  Your physician recommends that you continue on your current medications as directed. Please refer to the Current Medication list given to you today.  *If you need a refill on your cardiac medications before your next appointment, please call your pharmacy*   Testing/Procedures: Your physician has requested that you have an echocardiogram. Echocardiography is a painless test that uses sound waves to create images of your heart. It provides your doctor with information about the size and shape of your heart and how well your heart's chambers and valves are working. This procedure takes approximately one hour. There are no restrictions for this procedure. This procedure is done at [address].   Non-Cardiac CT Angiography (CTA), is a special type of CT scan that uses a computer to produce multi-dimensional views of major blood vessels throughout the body. In CT angiography, a contrast material is injected through an IV to help visualize the blood vessels.    Follow-Up: At [facility], you and your health needs are our priority.  As part of our continuing mission to provide you with exceptional heart care, we have created designated Provider Care Teams.  These Care Teams include your primary Cardiologist (physician) and Advanced Practice Providers (APPs -  Physician Assistants and Nurse Practitioners) who all work together to provide you with the care you need, when you need it.  We recommend signing up for the patient portal called "MyChart".  Sign up information is provided on this After Visit Summary.  MyChart is used to connect with patients for Virtual Visits (Telemedicine).  Patients are able to view lab/test results, encounter notes, upcoming appointments, etc.  Non-urgent messages can be sent to your provider as well.   To learn more about what you can do with MyChart, go to [email or web address].    Your next appointment:   3 month(s)  The format for  your next appointment:   In Person  Provider:   Quay Burow, MD

## 2021-09-13 NOTE — Patient Instructions (Signed)
   Check your blood pressure at home daily and keep record of the readings.  Take your BP meds as follows:  Continue with your current medications.  Bring all of your meds, your BP cuff and your record of home blood pressures to your next appointment.  Exercise as you're able, try to walk approximately 30 minutes per day.  Keep salt intake to a minimum, especially watch canned and prepared boxed foods.  Eat more fresh fruits and vegetables and fewer canned items.  Avoid eating in fast food restaurants.    HOW TO TAKE YOUR BLOOD PRESSURE: Rest 5 minutes before taking your blood pressure.  Don't smoke or drink caffeinated beverages for at least 30 minutes before. Take your blood pressure before (not after) you eat. Sit comfortably with your back supported and both feet on the floor (don't cross your legs). Elevate your arm to heart level on a table or a desk. Use the proper sized cuff. It should fit smoothly and snugly around your bare upper arm. There should be enough room to slip a fingertip under the cuff. The bottom edge of the cuff should be 1 inch above the crease of the elbow. Ideally, take 3 measurements at one sitting and record the average.   

## 2021-09-13 NOTE — Progress Notes (Signed)
10/20/2021 [name] 10/30/1966 [id number]   HPI:  Misty Mahoney is a 55 y.o. female patient of Dr Oval Linsey, with a PMH below who presents today for hypertension clinic evaluation.  She was seen last month and noted she had had hypertension for 20+ years, since the birth of her son, but has only been on medications to treat it for the past 15 years.  Before seeing Dr. Oval Linsey she was started on carvedilol, which seems to have helped, although she notes home readings still 409-811'B systolic.  At her last visit she noted feeling "tired all the time" and wasn't sure if it was due to medications, health issues or general deconditioning.  She scheduled an appointment with a functional MD to work on her overall health.    Today she returns for follow up.  She has seen her new MD and started a diet that is free of sugars, gluten, dairy and caffeine.  After 6 weeks she will slowly re-introduce these.  She saw Dr. Gwenlyn Found today because of concerns for RAS.  He is ordering a CT to confirm this before moving forward with any procedures.     Past Medical History: AF Uses prn fleccainide, CHADS2-VASc score 2 (HTN, female), declined anticoagulation at this time  DM2 Last noted 2020 at 6.2 on metformin  CHF Grade 2 diastolic dysfunction, EF 14-78%.    OSA On CPAP     Blood Pressure Goal:  130/80  Current Medications: spironolactone 25 mg qd, chlorthalidone 25 mg qd, carvedilol 3.125 bid, valsartan 320 mg qd,  diltiazem 240 mg qd  Family Hx: both parents with hypertension, mother with stroke at 43, died 2 months later form pneumonia; father with alzehimers'; brother older with hypertension better controlled, sister died breast cancer, 2 children w/o hypertension  Social Hx: no, rare alcohol, gave up caffeine 2 months ago - still rare cup on the weekends  Diet: home cooked, rare salt with cooking; nothing deep fried; all vegetables; now sugar, gluten, dairy and caffeine free.     Exercise: 2 days per week go to pool for laps/tread, walks 1 mile  Home BP readings: WA cuff, did not bring with her today for calibration  7 day average 127/64  Last month average 144/69 in AM, 145/71 in PM  Intolerances: lisinopirl - cough  Labs: 08/12/21:  Na 143, K 4.1, Glu 88, BUN 29, SCr 0.97 GFR 69   Wt Readings from Last 3 Encounters:  10/18/21 252 lb 12.8 oz (114.7 kg)  09/13/21 251 lb (113.9 kg)  09/13/21 251 lb (113.9 kg)   BP Readings from Last 3 Encounters:  10/18/21 128/82  09/13/21 113/74  09/13/21 114/74   Pulse Readings from Last 3 Encounters:  10/18/21 64  09/13/21 61  09/13/21 61    Current Outpatient Medications  Medication Sig Dispense Refill   carvedilol (COREG) 3.125 MG tablet Take 1 tablet by mouth with food 2 times per day 180 tablet 0   chlorthalidone (HYGROTON) 25 MG tablet Take 1 tablet (25 mg total) by mouth daily. 30 tablet 3   Cholecalciferol (VITAMIN D) 2000 units CAPS Take 1 capsule by mouth daily.     diltiazem (DILT-XR) 240 MG 24 hr capsule Take 1 capsule by mouth daily 90 capsule 20   escitalopram (LEXAPRO) 10 MG tablet Take 1 tablet by mouth daily. 90 tablet 0   flecainide (TAMBOCOR) 150 MG tablet Take 2 tablets (300 mg total) by mouth as needed. 90 tablet 3  Magnesium 250 MG TABS Take 1 tablet by mouth daily.     metFORMIN (GLUCOPHAGE-XR) 500 MG 24 hr tablet Take 1,000 mg by mouth at bedtime.     silver sulfADIAZINE (SILVADENE) 1 % cream Apply once to twice daily to wound until healed 50 g 0   spironolactone (ALDACTONE) 25 MG tablet Take 1 tablet (25 mg total) by mouth daily. 90 tablet 3   valsartan (DIOVAN) 320 MG tablet Take 1 tablet by mouth daily 90 tablet 1   No current facility-administered medications for this visit.    Allergies  Allergen Reactions   Epinephrine     Hx of a fib   Lisinopril Cough   Sunflower Oil Swelling    Seeds     Past Medical History:  Diagnosis Date   Anemia    gestational   Atypical mole  07/24/2012   right abdomen moderate   Atypical mole 07/10/2018   right supra pubic moderate   Chicken pox as a child   Depression 06/15/2015   DM, gestational, diet controlled 04/10/2014   insulin resistant- A1C5.7    GERD (gastroesophageal reflux disease)    undx'd GERD    Hyperlipidemia 12/20/2015   Hypertension    Obesity, unspecified 04/10/2014   OSA (obstructive sleep apnea)    uses cpap   Osteoarthritis    Paroxysmal atrial fibrillation ([place])    Prediabetes    SVT (supraventricular tachycardia) ([place])    s/p RFCA 09/26/12   SVT (supraventricular tachycardia) ([place])    a. s/p slow pathway modification 2013 Dr Lovena Le   Vitamin D deficiency 12/26/2015   Screening for Secondary Hypertension:   Causes 07/19/2021 08/18/2021  Drugs/Herbals Screened -     - Comments NSAIDS twice per week -  Renovascular HTN Screened Screened     - Comments Check renal artery Dopplers right RAS, has appt with Dr. Gwenlyn Found in October  Sleep Apnea Screened -     - Comments on CPAP -  Thyroid Disease Screened -     - Comments repeat TSH -  Hyperaldosteronism Screened Screened     - Comments check renal artery Dopplers. WNL  Pheochromocytoma Screened -     - Comments Negative in 2019.  No symtpoms. -  Cushing's Syndrome Screened -     - Comments cortisol normal -    Relevant Labs/Studies: Basic Labs Latest Ref Rng & Units 08/12/2021 07/30/2020 07/17/2019  Sodium 134 - 144 mmol/L 143 140 143  Potassium 3.5 - 5.2 mmol/L 4.1 3.4(L) 4.3  Creatinine 0.57 - 1.00 mg/dL 0.97 0.91 0.81    Thyroid  Latest Ref Rng & Units 07/17/2019 03/05/2018  TSH 0.35 - 4.50 uIU/mL 1.39 1.900    Renin/Aldosterone  Latest Ref Rng & Units 08/05/2021 02/14/2018  Aldosterone 0.0 - 30.0 ng/dL 4.5 6  Renin 0.167 - 5.380 ng/mL/hr 0.312 -  Aldos/Renin Ratio 0.0 - 30.0 14.4 -       Cortisol Latest Ref Rng & Units 12/13/2017  Cortisol  ug/dL 4.2    Renovascular  08/05/2021  Renal Artery [place] Completed Yes     Blood pressure 113/74,  pulse 61, resp. rate 15, height 5' 8.5" (1.74 m), weight 251 lb (113.9 kg), last menstrual period 06/24/2018, SpO2 99 %.  Hypertension Patient with essential hypertension, now appearing to be at goal on combination of 5 medications.  No changes to medications at this time.  She will continue to monitor and we will follow up in another month.    Tommy Medal  PharmD CPP [facility] [address] [place], [place] [address] [phone]

## 2021-09-15 ENCOUNTER — Other Ambulatory Visit (HOSPITAL_COMMUNITY): Payer: Self-pay

## 2021-09-16 DIAGNOSIS — Z23 Encounter for immunization: Secondary | ICD-10-CM | POA: Diagnosis not present

## 2021-09-19 DIAGNOSIS — Z1239 Encounter for other screening for malignant neoplasm of breast: Secondary | ICD-10-CM | POA: Diagnosis not present

## 2021-09-19 DIAGNOSIS — Z803 Family history of malignant neoplasm of breast: Secondary | ICD-10-CM | POA: Diagnosis not present

## 2021-10-04 ENCOUNTER — Other Ambulatory Visit (HOSPITAL_COMMUNITY): Payer: Self-pay

## 2021-10-07 ENCOUNTER — Telehealth: Payer: Self-pay

## 2021-10-07 NOTE — Telephone Encounter (Signed)
LVMT to pt who returned call Interested in starting next evening PREP class starting 11/01/21 615p-730p at [facility] Initial assessment scheduled for 11/29 at 530pm

## 2021-10-09 ENCOUNTER — Encounter: Payer: Self-pay | Admitting: Cardiovascular Disease

## 2021-10-09 ENCOUNTER — Ambulatory Visit
Admission: RE | Admit: 2021-10-09 | Discharge: 2021-10-09 | Disposition: A | Discharge status: Home / Self Care | Payer: 59 | Source: Ambulatory Visit | Attending: Family Medicine | Admitting: Family Medicine

## 2021-10-09 ENCOUNTER — Other Ambulatory Visit: Payer: Self-pay

## 2021-10-09 DIAGNOSIS — M25551 Pain in right hip: Secondary | ICD-10-CM | POA: Diagnosis not present

## 2021-10-09 DIAGNOSIS — M25559 Pain in unspecified hip: Secondary | ICD-10-CM

## 2021-10-09 DIAGNOSIS — R0600 Dyspnea, unspecified: Secondary | ICD-10-CM

## 2021-10-10 ENCOUNTER — Other Ambulatory Visit (HOSPITAL_COMMUNITY): Payer: Self-pay

## 2021-10-12 ENCOUNTER — Inpatient Hospital Stay: Admission: RE | Admit: 2021-10-12 | Payer: 59 | Source: Ambulatory Visit

## 2021-10-18 NOTE — Telephone Encounter (Signed)
Spoke to patient . Reschedule appointment to Dec 27,22023. Confirmed with CV[facility].

## 2021-10-19 ENCOUNTER — Ambulatory Visit: Payer: 59

## 2021-10-19 NOTE — Progress Notes (Signed)
[facility] PREP Evaluation  Patient Details  Name: Misty Mahoney MRN: [id number] Date of Birth: 01-19-1966 Age: 55 y.o. PCP: Eunice Blase, MD  Vitals:   10/18/21 1730  BP: 128/82  Pulse: 64  SpO2: 98%  Weight: 252 lb 12.8 oz (114.7 kg)     [facility] Eval - 10/19/21 1100       [facility] "PREP" Location   [facility] "PREP" Location [name]      Referral    Referring Provider Oval Linsey    Reason for referral Inactivity;Hypertension    Program Start Date 11/01/21   T/TH 615p-730p x 12 wks     Measurement   Waist Circumference 47 inches    Hip Circumference 52 inches    Body fat 47 percent      Information for Trainer   Goals CV improvement, Weight loss 10 lbs, goal 170, get more active    Current Exercise walking off and on 3 x a week goal    Orthopedic Concerns left knee arthritis    Pertinent Medical History HTN, ? renal stenosis, A1C 6, OSA on cpap    Medications that affect exercise Medication causing dizziness/drowsiness;Beta blocker      Timed Up and Go (TUGS)   Timed Up and Go Low risk <9 seconds      Mobility and Daily Activities   I find it easy to walk up or down two or more flights of stairs. 1    I have no trouble taking out the trash. 4    I do housework such as vacuuming and dusting on my own without difficulty. 4    I can easily lift a gallon of milk (8lbs). 4    I can easily walk a mile. 4    I have no trouble reaching into high cupboards or reaching down to pick up something from the floor. 3    I do not have trouble doing out-door work such as Armed forces logistics/support/administrative officer, raking leaves, or gardening. 3      Mobility and Daily Activities   I feel younger than my age. 1    I feel independent. 4    I feel energetic. 1    I live an active life.  2    I feel strong. 1    I feel healthy. 2    I feel active as other people my age. 1      How fit and strong are you.   Fit and Strong Total Score 35            Past Medical History:  Diagnosis Date   Anemia     gestational   Atypical mole 07/24/2012   right abdomen moderate   Atypical mole 07/10/2018   right supra pubic moderate   Chicken pox as a child   Depression 06/15/2015   DM, gestational, diet controlled 04/10/2014   insulin resistant- A1C5.7    GERD (gastroesophageal reflux disease)    undx'd GERD    Hyperlipidemia 12/20/2015   Hypertension    Obesity, unspecified 04/10/2014   OSA (obstructive sleep apnea)    uses cpap   Osteoarthritis    Paroxysmal atrial fibrillation ([facility])    Prediabetes    SVT (supraventricular tachycardia) ([place])    s/p RFCA 09/26/12   SVT (supraventricular tachycardia) ([place])    a. s/p slow pathway modification 2013 Dr Lovena Le   Vitamin D deficiency 12/26/2015   Past Surgical History:  Procedure Laterality Date   ADENOIDECTOMY  BREAST LUMPECTOMY WITH RADIOACTIVE SEED LOCALIZATION Right 08/03/2020   Procedure: RIGHT BREAST EXCISIONAL BIOPSY WITH RADIOACTIVE SEED LOCALIZATION;  Surgeon: Rolm Bookbinder, MD;  Location: [place];  Service: General;  Laterality: Right;   ENDOVENOUS ABLATION SAPHENOUS VEIN W/ LASER Bilateral    Thermal ablation   KNEE ARTHROSCOPY WITH MENISCAL REPAIR Left 08/25/2016   Procedure: KNEE ARTHROSCOPY WITH partial medial meniscectomy, chondroplasty;  Surgeon: Melrose Nakayama, MD;  Location: [place];  Service: Orthopedics;  Laterality: Left;  KNEE ARTHROSCOPY WITH partial medial meniscectomy, chondroplasty   PALATE / UVULA BIOPSY / EXCISION     TONSILLECTOMY  55 yrs old   V-TACH ABLATION N/A 09/26/2012   Procedure: V-TACH ABLATION;  Surgeon: Evans Lance, MD;  Location: [facility] CATH [facility];  Service: Cardiovascular;  Laterality: N/A;   WISDOM TOOTH EXTRACTION  55 yrs old   Social History   Tobacco Use  Smoking Status Never  Smokeless Tobacco Never   Intake done on 10/18/21 [name] 10/19/2021, 12:02 PM

## 2021-10-20 ENCOUNTER — Encounter: Payer: Self-pay | Admitting: Pharmacist Clinician (PhC)/ Clinical Pharmacy Specialist

## 2021-10-20 NOTE — Assessment & Plan Note (Signed)
Patient with essential hypertension, now appearing to be at goal on combination of 5 medications.  No changes to medications at this time.  She will continue to monitor and we will follow up in another month.

## 2021-10-24 ENCOUNTER — Ambulatory Visit (HOSPITAL_COMMUNITY): Payer: 59 | Attending: Cardiovascular Disease

## 2021-10-24 ENCOUNTER — Other Ambulatory Visit: Payer: Self-pay

## 2021-10-24 DIAGNOSIS — I10 Essential (primary) hypertension: Secondary | ICD-10-CM | POA: Insufficient documentation

## 2021-10-24 DIAGNOSIS — R0600 Dyspnea, unspecified: Secondary | ICD-10-CM | POA: Insufficient documentation

## 2021-10-24 LAB — ECHOCARDIOGRAM COMPLETE
Area-P 1/2: 2.87 cm2
S' Lateral: 3.6 cm

## 2021-10-24 MED ORDER — PERFLUTREN LIPID MICROSPHERE
1.0000 mL | INTRAVENOUS | Status: AC | PRN
  Administered 2021-10-24: 2 mL via INTRAVENOUS

## 2021-11-03 ENCOUNTER — Other Ambulatory Visit: Payer: Self-pay

## 2021-11-03 ENCOUNTER — Ambulatory Visit
Admission: RE | Admit: 2021-11-03 | Discharge: 2021-11-03 | Disposition: A | Discharge status: Home / Self Care | Payer: 59 | Source: Ambulatory Visit | Attending: Cardiovascular Disease | Admitting: Cardiovascular Disease

## 2021-11-03 ENCOUNTER — Encounter (HOSPITAL_BASED_OUTPATIENT_CLINIC_OR_DEPARTMENT_OTHER): Payer: Self-pay | Admitting: Cardiovascular Disease

## 2021-11-03 DIAGNOSIS — K449 Diaphragmatic hernia without obstruction or gangrene: Secondary | ICD-10-CM | POA: Diagnosis not present

## 2021-11-03 DIAGNOSIS — Q614 Renal dysplasia: Secondary | ICD-10-CM | POA: Diagnosis not present

## 2021-11-03 DIAGNOSIS — I10 Essential (primary) hypertension: Secondary | ICD-10-CM

## 2021-11-03 DIAGNOSIS — K429 Umbilical hernia without obstruction or gangrene: Secondary | ICD-10-CM | POA: Diagnosis not present

## 2021-11-03 DIAGNOSIS — I773 Arterial fibromuscular dysplasia: Secondary | ICD-10-CM | POA: Diagnosis not present

## 2021-11-03 MED ORDER — IOPAMIDOL (ISOVUE-370) INJECTION 76%
75.0000 mL | Freq: Once | INTRAVENOUS | Status: AC | PRN
  Administered 2021-11-03: 75 mL via INTRAVENOUS

## 2021-11-03 NOTE — Telephone Encounter (Signed)
Please advise 

## 2021-11-09 ENCOUNTER — Encounter (HOSPITAL_BASED_OUTPATIENT_CLINIC_OR_DEPARTMENT_OTHER): Payer: Self-pay

## 2021-11-09 NOTE — Progress Notes (Signed)
[facility] PREP Weekly Session  Patient Details  Name: Misty Mahoney MRN: [id number] Date of Birth: Oct 22, 1966 Age: 55 y.o. PCP: Eunice Blase, MD  Vitals:   11/08/21 1830  Weight: 256 lb 6.4 oz (116.3 kg)     [facility] Weekly seesion - 11/09/21 1000       [facility] "PREP" Location   [facility] "PREP" Location [name]      Weekly Session   Topic Discussed Healthy eating tips    Minutes exercised this week 50 minutes    Classes attended to date [place] 11/09/2021, 10:52 AM

## 2021-11-13 ENCOUNTER — Other Ambulatory Visit (HOSPITAL_COMMUNITY): Payer: Self-pay

## 2021-11-15 ENCOUNTER — Other Ambulatory Visit (HOSPITAL_COMMUNITY): Payer: Self-pay

## 2021-11-15 ENCOUNTER — Ambulatory Visit: Payer: 59

## 2021-11-23 ENCOUNTER — Other Ambulatory Visit: Payer: Self-pay | Admitting: General Surgery

## 2021-11-23 DIAGNOSIS — Z1231 Encounter for screening mammogram for malignant neoplasm of breast: Secondary | ICD-10-CM

## 2021-11-23 NOTE — Progress Notes (Signed)
[facility] PREP Weekly Session  Patient Details  Name: Misty Mahoney MRN: [id number] Date of Birth: [birth year] Age: 56 y.o. PCP: Eunice Blase, MD (Inactive)  There were no vitals filed for this visit.   [facility] Weekly seesion - 11/23/21 1000       [facility] "PREP" Location   [facility] "PREP" Location [name]      Weekly Session   Topic Discussed Healthy eating tips   part 2, sugar demo   Minutes exercised this week 100 minutes    Classes attended to date 5           Class on 11/22/21  [name] 11/23/2021, 10:40 AM

## 2021-11-28 NOTE — Progress Notes (Signed)
Advanced Hypertension Clinic:    Date:  11/29/2021   ID:  Misty Mahoney, DOB 06/25/1966, MRN [id number]  PCP:  Eunice Blase, MD  Cardiologist:  Virl Axe, MD  Nephrologist:  Referring MD: Eunice Blase, MD   CC: Hypertension  History of Present Illness:    Misty Mahoney is a 56 y.o. female with a hx of anemia, depression, diabetes, GERD, hyperlipidemia, hypertension, obesity, OSA on CPAP, paroxysmal atrial fibrillation, and SVT s/p RCFA, here for follow-up in the [facility]. In the past her blood pressure has been difficult to control. Most recently carvedilol was added and her blood pressure has been stable. She had an Echo in 2018 that revealed LVEF 60-65% with grade 2 diastolic dysfunction. She reports her blood pressure was elevated after the birth of her son over 20 years ago. She has been taking hypertension medications for +15 years but stays elevated [id number] and above systolic.    At her last visit, she was doing well and was taking flecainide as needed for her Afib episodes. Her labs 12/2020 showed GFR  92, Creatine  0.73, Potassium  4.1, TSH 1.1, A1C 6, Cholesterol 189, and LDL 109. Last year she began Ozempic and lost 10 pounds, but this was discontinued after she developed abdominal pain. She has an adrenal adenoma but testing was negative for hyperaldosteronism and pheochromocytoma. Spironolactone was added and doxazosin was discontinued. She was referred to the PREP exercise program. She had renal artery dopplers that showed >60% stenosis on the right. She had an abdominal CT 10/2021 that demonstrated fibromuscular dysplasia in the R renal artery and the L distal renal artery. Follow-up with Dr. Gwenlyn Found is pending. She saw our pharmacist 08/2021 and her blood pressure was better controlled.   Today, she is doing well and her blood pressure is better controlled. She underwent an MRI and had labs done. She was concerned about her low GFR and  dysplasia found on CT. She started hearing her heart beat in her L ear which concerned her after noting her dysplasia. She started the PREP program with her husband and is able to tolerate the exercise. She is trying to go more plant-based. Of note, she was infected wit COVID since her last visit. She denies any palpitations, chest pain, or shortness of breath, lightheadedness, headaches, syncope, orthopnea, PND, lower extremity edema or exertional symptoms.  Previous antihypertensives: Lisinopril - Severe Cough Irbesartan - Ineffective  Past Medical History:  Diagnosis Date   Anemia    gestational   Arterial fibromuscular dysplasia ([place]) 11/29/2021   Atypical mole 07/24/2012   right abdomen moderate   Atypical mole 07/10/2018   right supra pubic moderate   Chicken pox as a child   Depression 06/15/2015   DM, gestational, diet controlled 04/10/2014   insulin resistant- A1C5.7    GERD (gastroesophageal reflux disease)    undx'd GERD    Hyperlipidemia 12/20/2015   Hypertension    Obesity, unspecified 04/10/2014   OSA (obstructive sleep apnea)    uses cpap   Osteoarthritis    Paroxysmal atrial fibrillation ([place])    Prediabetes    SVT (supraventricular tachycardia) ([place])    s/p RFCA 09/26/12   SVT (supraventricular tachycardia) ([place])    a. s/p slow pathway modification 2013 Dr Lovena Le   Vitamin D deficiency 12/26/2015    Past Surgical History:  Procedure Laterality Date   ADENOIDECTOMY     BREAST LUMPECTOMY WITH RADIOACTIVE SEED LOCALIZATION Right 08/03/2020   Procedure: RIGHT BREAST EXCISIONAL  BIOPSY WITH RADIOACTIVE SEED LOCALIZATION;  Surgeon: Rolm Bookbinder, MD;  Location: [place];  Service: General;  Laterality: Right;   ENDOVENOUS ABLATION SAPHENOUS VEIN W/ LASER Bilateral    Thermal ablation   KNEE ARTHROSCOPY WITH MENISCAL REPAIR Left 08/25/2016   Procedure: KNEE ARTHROSCOPY WITH partial medial meniscectomy, chondroplasty;  Surgeon: Melrose Nakayama, MD;   Location: [facility];  Service: Orthopedics;  Laterality: Left;  KNEE ARTHROSCOPY WITH partial medial meniscectomy, chondroplasty   PALATE / UVULA BIOPSY / EXCISION     TONSILLECTOMY  56 yrs old   V-TACH ABLATION N/A 09/26/2012   Procedure: V-TACH ABLATION;  Surgeon: Evans Lance, MD;  Location[facility] CATH LAB;  Service: Cardiovascular;  Laterality: N/A;   WISDOM TOOTH EXTRACTION  56 yrs old    Current Medications: Current Meds  Medication Sig   carvedilol (COREG) 3.125 MG tablet Take 1 tablet by mouth with food 2 times per day   chlorthalidone (HYGROTON) 25 MG tablet Take 1 tablet (25 mg total) by mouth daily.   Cholecalciferol (VITAMIN D) 2000 units CAPS Take 1 capsule by mouth daily.   cyanocobalamin 1000 MCG tablet Take 1,000 mcg by mouth daily.   diltiazem (DILT-XR) 240 MG 24 hr capsule Take 1 capsule by mouth daily   escitalopram (LEXAPRO) 10 MG tablet Take 1 tablet by mouth daily.   flecainide (TAMBOCOR) 150 MG tablet Take 2 tablets (300 mg total) by mouth as needed.   Magnesium 250 MG TABS Take 1 tablet by mouth daily.   pyridoxine (B-6) 100 MG tablet Take 100 mg by mouth daily.   valsartan (DIOVAN) 320 MG tablet Take 1 tablet by mouth daily     Allergies:   Epinephrine, Lisinopril, and Sunflower oil   Social History   Socioeconomic History   Marital status: Significant Other    Spouse name: [name]   Number of children: 2   Years of education: Not on file   Highest education level: Not on file  Occupational History   Occupation: RN  Tobacco Use   Smoking status: Never   Smokeless tobacco: Never  Vaping Use   Vaping Use: Never used  Substance and Sexual Activity   Alcohol use: Yes    Comment: two or three times a year   Drug use: No   Sexual activity: Yes    Partners: Male    Comment: lives with kids, significant other and step daughter. minimizing dairy and gluten, exercise  Other Topics Concern   Not on file  Social History Narrative   Not  on file   Social Determinants of Health   Financial Resource Strain: Low Risk    Difficulty of Paying Living Expenses: Not hard at all  Food Insecurity: No Food Insecurity   Worried About Charity fundraiser in the Last Year: Never true   [place] in the Last Year: Never true  Transportation Needs: No Transportation Needs   Lack of Transportation (Medical): No   Lack of Transportation (Non-Medical): No  Physical Activity: Insufficiently Active   Days of Exercise per Week: 2 days   Minutes of Exercise per Session: 30 min  Stress: No Stress Concern Present   Feeling of Stress : Not at all  Social Connections: Not on file     Family History: The patient's family history includes ADD / ADHD in her daughter and son; Alzheimer's disease in her father; Atrial fibrillation in her father and mother; Breast cancer (age of onset:  63) in her sister; Cancer in her maternal grandfather and paternal grandmother; Cancer (age of onset: 77) in her sister; Cataracts in her father; Colon cancer in her maternal grandmother; Colon polyps in her father; Dementia in her father and mother; Heart attack in her maternal grandfather; Hypertension in her brother, father, and mother; Stroke in her mother. There is no history of Esophageal cancer, Rectal cancer, or Stomach cancer.  ROS:   Please see the history of present illness.    (+) L Ear ringing (+) Stress All other systems reviewed and are negative.  EKGs/Labs/Other Studies Reviewed:    CTA Abdomen 21-Nov-2021 Fibromuscular dysplasia of the bilateral renal arteries.   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 Maximum dimension on the coronal images estimated 15 mm. This appears relatively unchanged dating to the chest CT of 12/10/2017, with near circumferential mural calcification.   Very minimal aortic atherosclerosis.  Echo 10/24/21  1. Left ventricular ejection fraction, by estimation, is 60 to  65%. The  left ventricle has normal function. The left ventricle has no regional  wall motion abnormalities. Left ventricular diastolic parameters were  normal.   2. Right ventricular systolic function is normal. The right ventricular  size is normal.   3. Left atrial size was severely dilated.   4. The mitral valve is abnormal. Trivial mitral valve regurgitation. No  evidence of mitral stenosis.   5. The aortic valve is tricuspid. Aortic valve regurgitation is not  visualized. No aortic stenosis is present.   6. The inferior vena cava is normal in size with greater than 50%  respiratory variability, suggesting right atrial pressure of 3 mmHg.  Bilateral Renal Artery 08/05/21: Renal:     Right: Normal size right kidney. Abnormal right Resistive Index.         Normal cortical thickness of right kidney. Evidence of a >         60% stenosis of the right renal artery. RRV flow present.  Left:  Normal size of left kidney. Abnormal left Resisitve Index.         Normal cortical thickness of the left kidney. No evidence of         left renal artery stenosis. LRV flow present.  Mesenteric:  Normal Celiac artery and Superior Mesenteric artery findings.   TTE 07/10/2017: - Left ventricle: The cavity size was normal. Wall thickness was    normal. Systolic function was normal. The estimated ejection    fraction was in the range of 60% to 65%. Wall motion was normal;    there were no regional wall motion abnormalities. Features are    consistent with a pseudonormal left ventricular filling pattern,    with concomitant abnormal relaxation and increased filling    pressure (grade 2 diastolic dysfunction).  -------------------------------------------------------------------  Labs, prior tests, procedures, and surgery:  Transthoracic echocardiography (08/23/2015).     EF was 65% and PA  pressure was 35 (systolic).    EKG:  EKG was not ordered today 07/19/2021: Sinus rhythm. Rate 67 bpm.  LVH  Recent Labs: 08/12/2021: BUN 29; Creatinine, Ser 0.97; Potassium 4.1; Sodium 143   Recent Lipid Panel    Component Value Date/Time   CHOL 175 07/17/2019 0900   CHOL 177 08/22/2018 1015   TRIG 93.0 07/17/2019 0900   HDL 57.90 07/17/2019 0900   HDL 55 08/22/2018 1015   CHOLHDL 3 07/17/2019 0900   VLDL 18.6 07/17/2019 0900   LDLCALC 99 07/17/2019 0900  LDLCALC 100 (H) 08/22/2018 1015   03/2021: Total cholesterol 189, triglycerides 86, HDL 63, LDL 109 Creatinine 0.7, BUN 20, potassium 4.1 TSH 1.1 Hemoglobin A1c 6.0%.  Physical Exam:   VS:  BP 120/73 (BP Location: Left Arm, Patient Position: Sitting, Cuff Size: Large)    Pulse 60    Ht 5' 8.5" (1.74 m)    Wt 252 lb 6.4 oz (114.5 kg)    LMP 06/24/2018    SpO2 97%    BMI 37.82 kg/m  , BMI Body mass index is 37.82 kg/m. GENERAL:  Well appearing HEENT: Pupils equal round and reactive, fundi not visualized, oral mucosa unremarkable NECK:  No jugular venous distention, waveform within normal limits, carotid upstroke brisk and symmetric, no bruit LUNGS:  Clear to auscultation bilaterally HEART:  RRR.  PMI not displaced or sustained,S1 and S2 within normal limits, no S3, no S4, no clicks, no rubs, no murmurs ABD:  Flat, positive bowel sounds normal in frequency in pitch, no bruits, no rebound, no guarding, no midline pulsatile mass, no hepatomegaly, no splenomegaly EXT:  2 plus pulses throughout, no edema, no cyanosis no clubbing SKIN:  No rashes no nodules NEURO:  Cranial nerves II through XII grossly intact, motor grossly intact throughout PSYCH:  Cognitively intact, oriented to person place and time   ASSESSMENT/PLAN:    Atrial fibrillation Well-controlled.  Continue carvedilol, diltiazem, flecainide.  She follows with Dr. Caryl Comes.  OSA (obstructive sleep apnea) Continue CPAP.  She is working on weight loss.  Arterial fibromuscular dysplasia (HCC) She has bilateral renal artery fibromuscular dysplasia.  It is worse on the  right.  She has follow-up with Dr. Gwenlyn Found later this month.  She is also noticed a swishing sound in her left ear.  This can be seen in other areas as well.  We will get a CTA of the head and the neck to evaluate for other vascular territories.  We discussed the fact that her blood pressure is now better controlled, though it is taking 5 medications to do this.  She is interested in trying to be on fewer medicines if possible and will discuss with Dr. Gwenlyn Found whether or not she is a good candidate for stenting.  Hypertension Blood pressure is now better controlled on carvedilol, chlorthalidone, diltiazem, spironolactone, and valsartan.  She will keep working with PREP program at the [facility] and has follow-up with Dr. Gwenlyn Found for FMD of the renal arteries later this month.  Her adrenal adenoma is inert.    Screening for Secondary Hypertension:  Causes 07/19/2021 08/18/2021  Drugs/Herbals Screened -     - Comments NSAIDS twice per week -  Renovascular HTN Screened Screened     - Comments Check renal artery Dopplers right RAS, has appt with Dr. Gwenlyn Found in October  Sleep Apnea Screened -     - Comments on CPAP -  Thyroid Disease Screened -     - Comments repeat TSH -  Hyperaldosteronism Screened Screened     - Comments check renal artery Dopplers. WNL  Pheochromocytoma Screened -     - Comments Negative in 2019.  No symtpoms. -  Cushing's Syndrome Screened -     - Comments cortisol normal -    Relevant Labs/Studies: Basic Labs Latest Ref Rng & Units 08/12/2021 07/30/2020 07/17/2019  Sodium 134 - 144 mmol/L 143 140 143  Potassium 3.5 - 5.2 mmol/L 4.1 3.4(L) 4.3  Creatinine 0.57 - 1.00 mg/dL 0.97 0.91 0.81    Thyroid  Latest Ref Rng &  Units 07/17/2019 03/05/2018  TSH 0.35 - 4.50 uIU/mL 1.39 1.900         Cortisol Latest Ref Rng & Units 12/13/2017  Cortisol  ug/dL 4.2    Renovascular  08/05/2021  Renal Artery [place] Completed Yes       Disposition:    FU with Kayleb Warshaw C. Oval Linsey, MD, [facility] in 3  months.   Medication Adjustments/Labs and Tests Ordered: Current medicines are reviewed at length with the patient today.  Concerns regarding medicines are outlined above.  No orders of the defined types were placed in this encounter.   No orders of the defined types were placed in this encounter.   I,Mykaella Javier,acting as a scribe for Skeet Latch, MD.,have documented all relevant documentation on the behalf of Skeet Latch, MD,as directed by  Skeet Latch, MD while in the presence of Skeet Latch, MD.  I, Bee Oval Linsey, MD have reviewed all documentation for this visit.  The documentation of the exam, diagnosis, procedures, and orders on 11/29/2021 are all accurate and complete.    Signed, Skeet Latch, MD  11/29/2021 10:06 AM    [place]

## 2021-11-29 ENCOUNTER — Ambulatory Visit (INDEPENDENT_AMBULATORY_CARE_PROVIDER_SITE_OTHER): Payer: 59 | Admitting: Cardiovascular Disease

## 2021-11-29 ENCOUNTER — Encounter (HOSPITAL_BASED_OUTPATIENT_CLINIC_OR_DEPARTMENT_OTHER): Payer: Self-pay | Admitting: Cardiovascular Disease

## 2021-11-29 ENCOUNTER — Other Ambulatory Visit: Payer: Self-pay

## 2021-11-29 VITALS — BP 120/73 | HR 60 | Ht 68.5 in | Wt 252.4 lb

## 2021-11-29 DIAGNOSIS — I773 Arterial fibromuscular dysplasia: Secondary | ICD-10-CM | POA: Insufficient documentation

## 2021-11-29 DIAGNOSIS — G4733 Obstructive sleep apnea (adult) (pediatric): Secondary | ICD-10-CM | POA: Diagnosis not present

## 2021-11-29 DIAGNOSIS — H9313 Tinnitus, bilateral: Secondary | ICD-10-CM

## 2021-11-29 DIAGNOSIS — I480 Paroxysmal atrial fibrillation: Secondary | ICD-10-CM

## 2021-11-29 DIAGNOSIS — Z01812 Encounter for preprocedural laboratory examination: Secondary | ICD-10-CM

## 2021-11-29 DIAGNOSIS — I10 Essential (primary) hypertension: Secondary | ICD-10-CM

## 2021-11-29 HISTORY — DX: Arterial fibromuscular dysplasia: I77.3

## 2021-11-29 NOTE — Assessment & Plan Note (Addendum)
Continue CPAP.  She is working on weight loss.

## 2021-11-29 NOTE — Patient Instructions (Signed)
Medication Instructions:  Your physician recommends that you continue on your current medications as directed. Please refer to the Current Medication list given to you today.  *If you need a refill on your cardiac medications before your next appointment, please call your pharmacy*  Lab Work: BMET 1 [facility]/CMET IN 3 MONTHS 1 WEEK PRIOR TO CTA   If you have labs (blood work) drawn today and your tests are completely normal, you will receive your results only by: [place] (if you have MyChart) OR A paper copy in the mail If you have any lab test that is abnormal or we need to change your treatment, we will call you to review the results.  Testing/Procedures: Non-Cardiac CT Angiography (CTA), is a special type of CT scan that uses a computer to produce multi-dimensional views of major blood vessels throughout the body. In CT angiography, a contrast material is injected through an IV to help visualize the blood vessels OF NECK AND HEAD   Follow-Up: At [facility], you and your health needs are our priority.  As part of our continuing mission to provide you with exceptional heart care, we have created designated Provider Care Teams.  These Care Teams include your primary Cardiologist (physician) and Advanced Practice Providers (APPs -  Physician Assistants and Nurse Practitioners) who all work together to provide you with the care you need, when you need it.  We recommend signing up for the patient portal called "MyChart".  Sign up information is provided on this After Visit Summary.  MyChart is used to connect with patients for Virtual Visits (Telemedicine).  Patients are able to view lab/test results, encounter notes, upcoming appointments, etc.  Non-urgent messages can be sent to your provider as well.   To learn more about what you can do with MyChart, go to [email or web address].    Your next appointment:   3 month(s)  The format for your next  appointment:   In Person  Provider:   Skeet Latch, MD

## 2021-11-29 NOTE — Assessment & Plan Note (Signed)
Blood pressure is now better controlled on carvedilol, chlorthalidone, diltiazem, spironolactone, and valsartan.  She will keep working with [facility]P [facility] at the [facility] and has follow-up with Dr. Gwenlyn Found for FMD of the renal arteries later this month.  Her adrenal adenoma is inert.

## 2021-11-29 NOTE — Assessment & Plan Note (Addendum)
Well-controlled.  Continue carvedilol, diltiazem, flecainide.  She follows with Dr. Caryl Comes.

## 2021-11-29 NOTE — Assessment & Plan Note (Signed)
She has bilateral renal artery fibromuscular dysplasia.  It is worse on the right.  She has follow-up with Dr. Gwenlyn Found later this month.  She is also noticed a swishing sound in her left ear.  This can be seen in other areas as well.  We will get a CTA of the head and the neck to evaluate for other vascular territories.  We discussed the fact that her blood pressure is now better controlled, though it is taking 5 medications to do this.  She is interested in trying to be on fewer medicines if possible and will discuss with Dr. Gwenlyn Found whether or not she is a good candidate for stenting.

## 2021-11-30 NOTE — Progress Notes (Signed)
[facility] PREP Weekly Session  Patient Details  Name: Misty Mahoney MRN: [id number] Date of Birth: Feb 18, 1966 Age: 56 y.o. PCP: Eunice Blase, MD  Vitals:   11/29/21 1830  Weight: 253 lb (114.8 kg)     [facility] Weekly seesion - 11/30/21 1000       [facility] "PREP" Location   [facility]P" [facility]      Weekly Session   Topic Discussed Health habits    Minutes exercised this week 60 minutes   likely under reported time spent on strength training   Classes attended to date 7           Class held on 11/29/21   [name] 11/30/2021, 10:18 AM

## 2021-12-06 ENCOUNTER — Encounter (HOSPITAL_BASED_OUTPATIENT_CLINIC_OR_DEPARTMENT_OTHER): Payer: Self-pay

## 2021-12-06 ENCOUNTER — Ambulatory Visit (HOSPITAL_BASED_OUTPATIENT_CLINIC_OR_DEPARTMENT_OTHER)
Admission: RE | Admit: 2021-12-06 | Discharge: 2021-12-06 | Disposition: A | Discharge status: Home / Self Care | Payer: 59 | Source: Ambulatory Visit | Attending: Cardiovascular Disease | Admitting: Cardiovascular Disease

## 2021-12-06 ENCOUNTER — Other Ambulatory Visit: Payer: Self-pay

## 2021-12-06 DIAGNOSIS — I6523 Occlusion and stenosis of bilateral carotid arteries: Secondary | ICD-10-CM | POA: Diagnosis not present

## 2021-12-06 DIAGNOSIS — H9313 Tinnitus, bilateral: Secondary | ICD-10-CM | POA: Diagnosis not present

## 2021-12-06 DIAGNOSIS — I773 Arterial fibromuscular dysplasia: Secondary | ICD-10-CM | POA: Diagnosis not present

## 2021-12-06 LAB — POCT I-STAT CREATININE: Creatinine, Ser: 1.3 mg/dL — ABNORMAL HIGH (ref 0.44–1.00)

## 2021-12-06 MED ORDER — IOHEXOL 350 MG/ML SOLN
75.0000 mL | Freq: Once | INTRAVENOUS | Status: AC | PRN
  Administered 2021-12-06: 75 mL via INTRAVENOUS

## 2021-12-07 NOTE — Progress Notes (Signed)
[facility] PREP Weekly Session  Patient Details  Name: Denisha Hoel MRN: [id number] Date of Birth: 24-Sep-1966 Age: 56 y.o. PCP: Eunice Blase, MD  Vitals:   12/06/21 1830  Weight: 254 lb (115.2 kg)     [facility] Weekly seesion - 12/07/21 1000       [facility] "PREP" Location   [facility]      Weekly Session   Topic Discussed Restaurant Eating   Salt demo   Minutes exercised this week 100 minutes   2 strength days, stretching after workouts   Classes attended to date [place] 12/07/2021, 10:25 AM

## 2021-12-14 NOTE — Progress Notes (Signed)
[facility] PREP Weekly Session  Patient Details  Name: Misty Mahoney MRN: [id number] Date of Birth: 1966-06-24 Age: 56 y.o. PCP: Eunice Blase, MD  Vitals:   12/13/21 1830  Weight: 258 lb (117 kg)     [facility] Weekly seesion - 12/14/21 1200       [facility] "PREP" Location   [facility]      Weekly Session   Topic Discussed Stress management and problem solving    Minutes exercised this week 100 minutes   plus 2 sessions of strength training   Classes attended to date 10            Late entry: class held on 12/13/21 [name] 12/14/2021, 12:43 PM

## 2021-12-15 ENCOUNTER — Other Ambulatory Visit: Payer: Self-pay

## 2021-12-15 ENCOUNTER — Other Ambulatory Visit (HOSPITAL_COMMUNITY): Payer: Self-pay

## 2021-12-15 ENCOUNTER — Ambulatory Visit
Admission: RE | Admit: 2021-12-15 | Discharge: 2021-12-15 | Disposition: A | Discharge status: Home / Self Care | Payer: 59 | Source: Ambulatory Visit

## 2021-12-15 DIAGNOSIS — Z1231 Encounter for screening mammogram for malignant neoplasm of breast: Secondary | ICD-10-CM

## 2021-12-15 MED ORDER — CHLORTHALIDONE 25 MG PO TABS
ORAL_TABLET | Freq: Every day | ORAL | 3 refills | Status: DC
  Filled 2021-12-15: qty 90, 90d supply, fill #0

## 2021-12-15 MED ORDER — VALSARTAN 320 MG PO TABS
ORAL_TABLET | Freq: Every day | ORAL | 3 refills | Status: DC
  Filled 2021-12-15 – 2022-10-02 (×2): qty 90, 90d supply, fill #0

## 2021-12-15 MED ORDER — SPIRONOLACTONE 25 MG PO TABS
ORAL_TABLET | Freq: Every day | ORAL | 3 refills | Status: DC
  Filled 2021-12-15: qty 90, 90d supply, fill #0

## 2021-12-15 MED ORDER — ESCITALOPRAM OXALATE 10 MG PO TABS
ORAL_TABLET | Freq: Every day | ORAL | 3 refills | Status: DC
  Filled 2021-12-15: qty 90, 90d supply, fill #0

## 2021-12-15 MED ORDER — CARVEDILOL 3.125 MG PO TABS
ORAL_TABLET | Freq: Two times a day (BID) | ORAL | 3 refills | Status: DC
  Filled 2021-12-15: qty 180, 90d supply, fill #0

## 2021-12-22 ENCOUNTER — Other Ambulatory Visit (HOSPITAL_COMMUNITY): Payer: Self-pay

## 2021-12-22 MED ORDER — ESCITALOPRAM OXALATE 10 MG PO TABS
ORAL_TABLET | Freq: Every day | ORAL | 3 refills | Status: DC
  Filled 2021-12-22 – 2021-12-27 (×2): qty 90, 90d supply, fill #0
  Filled 2022-03-31: qty 90, 90d supply, fill #1

## 2021-12-22 NOTE — Progress Notes (Signed)
[facility] PREP Weekly Session  Patient Details  Name: Misty Mahoney MRN: [id number] Date of Birth: 1966-02-22 Age: 56 y.o. PCP: Eunice Blase, MD  Vitals:   12/20/21 1830  Weight: 256 lb (116.1 kg)     [facility] Weekly seesion - 12/22/21 1700       [facility] "PREP" Location   [facility]      Weekly Session   Topic Discussed Expectations and non-scale victories    Minutes exercised this week 160 minutes    Classes attended to date 12            Class held on 12/20/21 late entry [name] 12/22/2021, 5:08 PM

## 2021-12-24 ENCOUNTER — Other Ambulatory Visit (HOSPITAL_COMMUNITY): Payer: Self-pay

## 2021-12-27 ENCOUNTER — Other Ambulatory Visit (HOSPITAL_COMMUNITY): Payer: Self-pay

## 2021-12-28 ENCOUNTER — Encounter (HOSPITAL_COMMUNITY): Payer: Self-pay

## 2021-12-28 ENCOUNTER — Ambulatory Visit (INDEPENDENT_AMBULATORY_CARE_PROVIDER_SITE_OTHER): Payer: 59 | Admitting: Cardiovascular Disease

## 2021-12-28 ENCOUNTER — Other Ambulatory Visit: Payer: Self-pay

## 2021-12-28 ENCOUNTER — Encounter: Payer: Self-pay | Admitting: Cardiovascular Disease

## 2021-12-28 VITALS — BP 144/76 | HR 66 | Ht 68.5 in | Wt 255.0 lb

## 2021-12-28 DIAGNOSIS — I773 Arterial fibromuscular dysplasia: Secondary | ICD-10-CM

## 2021-12-28 DIAGNOSIS — I10 Essential (primary) hypertension: Secondary | ICD-10-CM | POA: Diagnosis not present

## 2021-12-28 DIAGNOSIS — E782 Mixed hyperlipidemia: Secondary | ICD-10-CM | POA: Diagnosis not present

## 2021-12-28 DIAGNOSIS — I480 Paroxysmal atrial fibrillation: Secondary | ICD-10-CM | POA: Diagnosis not present

## 2021-12-28 NOTE — Assessment & Plan Note (Signed)
Her most recent LDL is 120.  She is not on a statin drug.  She apparently did have a coronary calcium score performed 07/10/2017 by Dr. Caryl Comes which was 0.  It may be beneficial to repeat a coronary calcium score to suggest whether or not a statin drug would be beneficial.

## 2021-12-28 NOTE — Progress Notes (Signed)
12/28/2021 Misty Mahoney   1966-01-06  [id number]  Primary Physician Hilts, Legrand Como, MD Primary Cardiologist: Lorretta Harp MD FACP, [place], [place], [place]  HPI:  Misty Mahoney is a 55 y.o.  moderately overweight single Caucasian female who has been living with her partner for 10 years.  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  She was referred by Dr. Oval Linsey, her cardiologist, for resistant hypertension.  She does have a history of treated hypertension hyperlipidemia.  There is no family history of heart disease.  She is never had a heart attack or stroke.  She does complain of some dyspnea on exertion.  She has obstructive sleep apnea on CPAP.  She had renal Doppler studies performed 08/05/2021 that suggested the possibility of right renal artery fibromuscular dysplasia.  She has had hyper Aldo ruled out as well.   Since I saw her 3 months ago I did get a abdominal CTA on 11/03/2021 that confirmed bilateral renal FMD.  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  She is involved in aggressive lifestyle modification.  Blood pressures have been under much better control.  We talked about renal intervention versus continued medical therapy and lifestyle modification which she currently prefers to do.  Current Meds  Medication Sig   carvedilol (COREG) 3.125 MG tablet Take 1 tablet by mouth twice daily   chlorthalidone (HYGROTON) 25 MG tablet Take 1 tablet by mouth once daily   Cholecalciferol (VITAMIN D) 2000 units CAPS Take 1 capsule by mouth daily.   cyanocobalamin 1000 MCG tablet Take 1,000 mcg by mouth daily.   diltiazem (DILT-XR) 240 MG 24 hr capsule Take 1 capsule by mouth daily   escitalopram (LEXAPRO) 10 MG tablet Take 1 tablet by mouth daily.   flecainide (TAMBOCOR) 150 MG tablet Take 2 tablets (300 mg total) by mouth as needed.    Magnesium 250 MG TABS Take 1 tablet by mouth daily.   pyridoxine (B-6) 100 MG tablet Take 100 mg by mouth daily.   spironolactone (ALDACTONE) 25 MG tablet Take 1 tablet by mouth once daily   valsartan (DIOVAN) 320 MG tablet Take 1 tablet by mouth once daily   [DISCONTINUED] carvedilol (COREG) 3.125 MG tablet Take 1 tablet by mouth with food 2 times per day   [DISCONTINUED] escitalopram (LEXAPRO) 10 MG tablet Take 1 tablet by mouth daily.   [DISCONTINUED] escitalopram (LEXAPRO) 10 MG tablet Take 1 tablet by mouth once daily     Allergies  Allergen Reactions   Epinephrine     Hx of a fib   Lisinopril Cough   Sunflower Oil Swelling    Seeds     Social History   Socioeconomic History   Marital status: Significant Other    Spouse name: [name]   Number of children: 2   Years of education: Not on file   Highest education level: Not on file  Occupational History   Occupation: RN  Tobacco Use   Smoking status: Never   Smokeless tobacco: Never  Vaping Use   Vaping Use: Never used  Substance and Sexual Activity   Alcohol use: Yes    Comment: two or three times a year   Drug use: No   Sexual activity: Yes    Partners: Male    Comment: lives with kids, significant other  and step daughter. minimizing dairy and gluten, exercise  Other Topics Concern   Not on file  Social History Narrative   Not on file   Social Determinants of Health   Financial Resource Strain: Low Risk    Difficulty of Paying Living Expenses: Not hard at all  Food Insecurity: No Food Insecurity   Worried About Charity fundraiser in the Last Year: Never true   Pleasants in the Last Year: Never true  Transportation Needs: No Transportation Needs   Lack of Transportation (Medical): No   Lack of Transportation (Non-Medical): No  Physical Activity: Insufficiently Active   Days of Exercise per Week: 2 days   Minutes of Exercise per Session: 30 min  Stress: No Stress Concern Present   Feeling of  Stress : Not at all  Social Connections: Not on file  Intimate Partner Violence: Not on file     Review of Systems: General: negative for chills, fever, night sweats or weight changes.  Cardiovascular: negative for chest pain, dyspnea on exertion, edema, orthopnea, palpitations, paroxysmal nocturnal dyspnea or shortness of breath Dermatological: negative for rash Respiratory: negative for cough or wheezing Urologic: negative for hematuria Abdominal: negative for nausea, vomiting, diarrhea, bright red blood per rectum, melena, or hematemesis Neurologic: negative for visual changes, syncope, or dizziness All other systems reviewed and are otherwise negative except as noted above.    Blood pressure (!) 144/76, pulse 66, height 5' 8.5" (1.74 m), weight 255 lb (115.7 kg), last menstrual period 06/24/2018, SpO2 98 %.  General appearance: alert and no distress Neck: no adenopathy, no carotid bruit, no JVD, supple, symmetrical, trachea midline, and thyroid not enlarged, symmetric, no tenderness/mass/nodules Lungs: clear to auscultation bilaterally Heart: regular rate and rhythm, S1, S2 normal, no murmur, click, rub or gallop Extremities: extremities normal, atraumatic, no cyanosis or edema Pulses: 2+ and symmetric Skin: Skin color, texture, turgor normal. No rashes or lesions Neurologic: Grossly normal  EKG sinus rhythm at 66 without ST or T wave changes.  Personally reviewed this EKG.  ASSESSMENT AND PLAN:   Arterial fibromuscular dysplasia [facility]) [name] was initially referred to me for resistant hypertension and renal Doppler suggested at FMD.  She had a CTA of her abdomen performed 11/03/2021 which confirmed bilateral renal FMD.  There was a 15 mm left renal artery aneurysm that was unchanged over the last 3 years and had circumferential calcification.  She is on multiple medications although her blood pressure is controlled and she is currently in the "prep program is interested in  dietary modification and weight loss.  We talked about intervention versus continued medical therapy with lifestyle modification and she prefers the latter approach which I cannot disagree with.  Her renal function is otherwise relatively normal.  Should her blood pressure become more difficult to control in the future and/or she wishes to potentially be on fewer medications I am more than happy to pursue an invasive approach with renal artery PTA.  Hyperlipidemia Her most recent LDL is 120.  She is not on a statin drug.  She apparently did have a coronary calcium score performed 07/10/2017 by Dr. Caryl Comes which was 0.  It may be beneficial to repeat a coronary calcium score to suggest whether or not a statin drug would be beneficial.     Lorretta Harp MD [facility], [facility] 12/28/2021 9:52 AM

## 2021-12-28 NOTE — Assessment & Plan Note (Signed)
Mr. Vohra was initially referred to me for resistant hypertension and renal Doppler suggested at FMD.  She had a CTA of her abdomen performed 11/03/2021 which confirmed bilateral renal FMD.  There was a 15 mm left renal artery aneurysm that was unchanged over the last 3 years and had circumferential calcification.  She is on multiple medications although her blood pressure is controlled and she is currently in the "prep program is interested in dietary modification and weight loss.  We talked about intervention versus continued medical therapy with lifestyle modification and she prefers the latter approach which I cannot disagree with.  Her renal function is otherwise relatively normal.  Should her blood pressure become more difficult to control in the future and/or she wishes to potentially be on fewer medications I am more than happy to pursue an invasive approach with renal artery PTA.

## 2021-12-28 NOTE — Patient Instructions (Signed)

## 2021-12-29 NOTE — Progress Notes (Signed)
[facility] PREP Weekly Session  Patient Details  Name: NASHEA CHUMNEY MRN: [id number] Date of Birth: 19-Jan-1966 Age: 56 y.o. PCP: Eunice Blase, MD  Vitals:   12/27/21 1830  Weight: 255 lb (115.7 kg)     [facility] Weekly seesion - 12/29/21 1700       [facility] "PREP" Location   [facility]PREP" [facility]      Weekly Session   Topic Discussed Other   Portion size, reading labels   Minutes exercised this week 90 minutes   2 sessions of strength training   Classes attended to date 13           Class held on 12/27/21  [name] 12/29/2021, 5:20 PM

## 2022-01-04 NOTE — Telephone Encounter (Signed)
Patient was seen 11/2021 by Dr Oval Linsey

## 2022-01-04 NOTE — Progress Notes (Signed)
[facility] PREP Weekly Session  Patient Details  Name: Misty Mahoney MRN: [id number] Date of Birth: 1965/11/22 Age: 56 y.o. PCP: Eunice Blase, MD  Vitals:   01/03/22 1830  Weight: 255 lb 12.8 oz (116 kg)     [facility] Weekly seesion - 01/04/22 0900       [facility] "PREP" Location   [facility] "PREP" Location [name]      Weekly Session   Topic Discussed Finding support    Minutes exercised this week 120 minutes   3 weight training sessions   Classes attended to date 16           Class held on 01/03/22  [name] 01/04/2022, 9:12 AM

## 2022-01-05 ENCOUNTER — Other Ambulatory Visit (HOSPITAL_COMMUNITY): Payer: Self-pay

## 2022-01-06 ENCOUNTER — Other Ambulatory Visit (HOSPITAL_COMMUNITY): Payer: Self-pay

## 2022-01-12 NOTE — Progress Notes (Signed)
[facility] PREP Weekly Session  Patient Details  Name: Misty Mahoney MRN: [id number] Date of Birth: 1966/01/16 Age: 56 y.o. PCP: Eunice Blase, MD  Vitals:   01/10/22 1820  Weight: 254 lb (115.2 kg)     [facility] Weekly seesion - 01/12/22 1600       [facility] "PREP" Location   [facility]      Weekly Session   Topic Discussed Calorie breakdown    Minutes exercised this week 150 minutes   2 strength training sessions   Classes attended to date 48            Late entry [name] 01/12/2022, 4:58 PM

## 2022-01-20 NOTE — Progress Notes (Signed)
[facility] PREP Evaluation ? ?Patient Details  ?Name: Misty Mahoney ?MRN: [id number] ?Date of Birth: 16-Feb-1966 ?Age: 56 y.o. ?PCP: Eunice Blase, MD ? ?Vitals:  ? 01/19/22 1830  ?BP: 128/76  ?Pulse: 73  ?SpO2: 98%  ?Weight: 246 lb (111.6 kg)  ? ? ? [facility] Eval - 01/20/22 1300   ? ?  ? [facility]PREP[facility] Location  ? [facility] [place]   ?  ? Referral   ? Referring Provider Oval Linsey   ? Program Start Date 01/19/22   Final PREP date  ?  ? Measurement  ? Waist Circumference 44.5 inches   ? Hip Circumference 50 inches   ? Body fat 46 percent   46.33 per [employer] 3D  ?  ? Information for Trainer  ? Goals Plans to continue get 150 min of cardio and 2-4x strength per week   ?  ? Mobility and Daily Activities  ? I find it easy to walk up or down two or more flights of stairs. 2   ? I have no trouble taking out the trash. 4   ? I do housework such as vacuuming and dusting on my own without difficulty. 4   ? I can easily lift a gallon of milk (8lbs). 4   ? I can easily walk a mile. 4   ? I have no trouble reaching into high cupboards or reaching down to pick up something from the floor. 4   ? I do not have trouble doing out-door work such as Armed forces logistics/support/administrative officer, raking leaves, or gardening. 4   ?  ? Mobility and Daily Activities  ? I feel younger than my age. 2   ? I feel independent. 4   ? I feel energetic. 2   ? I live an active life.  3   ? I feel strong. 3   ? I feel healthy. 3   ? I feel active as other people my age. 3   ?  ? How fit and strong are you.  ? Fit and Strong Total Score 46   ? ?  ?  ? ?  ? ?Past Medical History:  ?Diagnosis Date  ? Anemia   ? gestational  ? Arterial fibromuscular dysplasia ([facility]) 11/29/2021  ? Atypical mole 07/24/2012  ? right abdomen moderate  ? Atypical mole 07/10/2018  ? right supra pubic moderate  ? Chicken pox as a child  ? Depression 06/15/2015  ? DM, gestational, diet controlled 04/10/2014  ? insulin resistant- A1C5.7   ? GERD (gastroesophageal reflux disease)   ? undx'd GERD   ?  Hyperlipidemia 12/20/2015  ? Hypertension   ? Obesity, unspecified 04/10/2014  ? OSA (obstructive sleep apnea)   ? uses cpap  ? Osteoarthritis   ? Paroxysmal atrial fibrillation (HCC)   ? Prediabetes   ? SVT (supraventricular tachycardia) ([place])   ? s/p RFCA 09/26/12  ? SVT (supraventricular tachycardia) ([place])   ? a. s/p slow pathway modification 2013 Dr Lovena Le  ? Vitamin D deficiency 12/26/2015  ? ?Past Surgical History:  ?Procedure Laterality Date  ? ADENOIDECTOMY    ? BREAST LUMPECTOMY WITH RADIOACTIVE SEED LOCALIZATION Right 08/03/2020  ? Procedure: RIGHT BREAST EXCISIONAL BIOPSY WITH RADIOACTIVE SEED LOCALIZATION;  Surgeon: Rolm Bookbinder, MD;  Location: [facility];  Service: General;  Laterality: Right;  ? ENDOVENOUS ABLATION SAPHENOUS VEIN W/ LASER Bilateral   ? Thermal ablation  ? KNEE ARTHROSCOPY WITH MENISCAL REPAIR Left 08/25/2016  ? Procedure: KNEE  ARTHROSCOPY WITH partial medial meniscectomy, chondroplasty;  Surgeon: Melrose Nakayama, MD;  Location: [facility];  Service: Orthopedics;  Laterality: Left;  KNEE ARTHROSCOPY WITH partial medial meniscectomy, chondroplasty  ? PALATE / UVULA BIOPSY / EXCISION    ? TONSILLECTOMY  56 yrs old  ? V-TACH ABLATION N/A 09/26/2012  ? Procedure: V-TACH ABLATION;  Surgeon: Evans Lance, MD;  [facility] C[facility] LAB;  Service: Cardiovascular;  Laterality: N/A;  ? WISDOM TOOTH EXTRACTION  56 yrs old  ? ?Social History  ? ?Tobacco Use  ?Smoking Status Never  ?Smokeless Tobacco Never  ? ?Cardio march test: 270 to 370 ?Sit to stand: 10 to 13 ?Bicep curl: 16 to 26 ? ?Encouraged to work on single leg balance  ? ? ?[name] ?01/20/2022, 1:43 PM ? ? ?

## 2022-03-07 ENCOUNTER — Ambulatory Visit (HOSPITAL_BASED_OUTPATIENT_CLINIC_OR_DEPARTMENT_OTHER): Payer: 59 | Admitting: Cardiovascular Disease

## 2022-03-07 NOTE — Progress Notes (Incomplete)
? ?Advanced Hypertension Clinic Follow-up:   ? ?Date:  03/07/2022  ? ?ID:  Misty Mahoney, DOB 18-Oct-1966, MRN [id number] ? ?PCP:  Eunice Blase, MD  ?Cardiologist:  Virl Axe, MD  ?Nephrologist: ? ?Referring MD: Eunice Blase, MD  ? ?CC: Hypertension ? ?History of Present Illness:   ? ?Misty Mahoney is a 56 y.o. female with a hx of anemia, depression, diabetes, GERD, hyperlipidemia, hypertension, obesity, OSA on CPAP, paroxysmal atrial fibrillation, and SVT s/p RCFA, here for follow-up in the Advanced Hypertension Clinic. In the past her blood pressure has been difficult to control. Most recently carvedilol was added and her blood pressure has been stable. She had an Echo in 2018 that revealed LVEF 60-65% with grade 2 diastolic dysfunction. She reports her blood pressure was elevated after the birth of her son over 20 years ago. She has been taking hypertension medications for +15 years but stays elevated 751Z and above systolic.   ? ?She was taking flecainide as needed for her Afib episodes. Her labs 12/2020 showed GFR  92, Creatine  0.73, Potassium  4.1, TSH 1.1, A1C 6, Cholesterol 189, and LDL 109. In 2021 she began Ozempic and lost 10 pounds, but this was discontinued after she developed abdominal pain. She has an adrenal adenoma but testing was negative for hyperaldosteronism and pheochromocytoma. Spironolactone was added and doxazosin was discontinued. She was referred to the PREP exercise program. She had renal artery dopplers that showed >60% stenosis on the right. She had an abdominal CT 10/2021 that demonstrated fibromuscular dysplasia in the R renal artery and the L distal renal artery. Follow-up with Dr. Gwenlyn Found is pending. She saw our pharmacist 08/2021 and her blood pressure was better controlled.  ? ?At her last appointment she was doing well with better controlled blood pressure and good exercise tolerance with PREP. She was working on moving towards a plant-based diet. ?Today, she is doing well  and her blood pressure is better controlled. She underwent an MRI and had labs done. She was concerned about her low GFR and dysplasia found on CT. She started hearing her heart beat in her L ear which concerned her after noting her dysplasia. She started the PREP program with her husband and is able to tolerate the exercise. She is trying to go more plant-based. Of note, she was infected wit COVID since her last visit. She denies any palpitations, chest pain, or shortness of breath, lightheadedness, headaches, syncope, orthopnea, PND, lower extremity edema or exertional symptoms. ? ?Today, ? ?She denies any palpitations, chest pain, shortness of breath, or peripheral edema. No lightheadedness, headaches, syncope, orthopnea, or PND. ? ?(+) ? ?Previous antihypertensives: ?Lisinopril - Severe Cough ?Irbesartan - Ineffective ? ?Past Medical History:  ?Diagnosis Date  ? Anemia   ? gestational  ? Arterial fibromuscular dysplasia ([place]) 11/29/2021  ? Atypical mole 07/24/2012  ? right abdomen moderate  ? Atypical mole 07/10/2018  ? right supra pubic moderate  ? Chicken pox as a child  ? Depression 06/15/2015  ? DM, gestational, diet controlled 04/10/2014  ? insulin resistant- A1C5.7   ? GERD (gastroesophageal reflux disease)   ? undx'd GERD   ? Hyperlipidemia 12/20/2015  ? Hypertension   ? Obesity, unspecified 04/10/2014  ? OSA (obstructive sleep apnea)   ? uses cpap  ? Osteoarthritis   ? Paroxysmal atrial fibrillation ([facility])   ? Prediabetes   ? SVT (supraventricular tachycardia) ([place])   ? s/p RFCA 09/26/12  ? SVT (supraventricular tachycardia) ([place])   ?  a. s/p slow pathway modification 2013 Dr Lovena Le  ? Vitamin D deficiency 12/26/2015  ? ? ?Past Surgical History:  ?Procedure Laterality Date  ? ADENOIDECTOMY    ? BREAST LUMPECTOMY WITH RADIOACTIVE SEED LOCALIZATION Right 08/03/2020  ? Procedure: RIGHT BREAST EXCISIONAL BIOPSY WITH RADIOACTIVE SEED LOCALIZATION;  Surgeon: Rolm Bookbinder, MD;  Location: [facility];  Service: General;  Laterality: Right;  ? ENDOVENOUS ABLATION SAPHENOUS VEIN W/ LASER Bilateral   ? Thermal ablation  ? KNEE ARTHROSCOPY WITH MENISCAL REPAIR Left 08/25/2016  ? Procedure: KNEE ARTHROSCOPY WITH partial medial meniscectomy, chondroplasty;  Surgeon: Melrose Nakayama, MD;  Location: [facility];  Service: Orthopedics;  Laterality: Left;  KNEE ARTHROSCOPY WITH partial medial meniscectomy, chondroplasty  ? PALATE / UVULA BIOPSY / EXCISION    ? TONSILLECTOMY  56 yrs old  ? V-TACH ABLATION N/A 09/26/2012  ? Procedure: V-TACH ABLATION;  Surgeon: Evans Lance, MD;  Location: [facility] CATH LAB;  Service: Cardiovascular;  Laterality: N/A;  ? WISDOM TOOTH EXTRACTION  56 yrs old  ? ? ?Current Medications: ?No outpatient medications have been marked as taking for the 03/07/22 encounter (Appointment) with Skeet Latch, MD.  ?  ? ?Allergies:   Epinephrine, Lisinopril, and Sunflower oil  ? ?Social History  ? ?Socioeconomic History  ? Marital status: Significant Other  ?  Spouse name: [name]  ? Number of children: 2  ? Years of education: Not on file  ? Highest education level: Not on file  ?Occupational History  ? Occupation: Therapist, sports  ?Tobacco Use  ? Smoking status: Never  ? Smokeless tobacco: Never  ?Vaping Use  ? Vaping Use: Never used  ?Substance and Sexual Activity  ? Alcohol use: Yes  ?  Comment: two or three times a year  ? Drug use: No  ? Sexual activity: Yes  ?  Partners: Male  ?  Comment: lives with kids, significant other and step daughter. minimizing dairy and gluten, exercise  ?Other Topics Concern  ? Not on file  ?Social History Narrative  ? Not on file  ? ?Social Determinants of Health  ? ?Financial Resource Strain: Low Risk   ? Difficulty of Paying Living Expenses: Not hard at all  ?Food Insecurity: No Food Insecurity  ? Worried About Charity fundraiser in the Last Year: Never true  ? Ran Out of Food in the Last Year: Never true  ?Transportation Needs: No Transportation Needs  ? Lack  of Transportation (Medical): No  ? Lack of Transportation (Non-Medical): No  ?Physical Activity: Insufficiently Active  ? Days of Exercise per Week: 2 days  ? Minutes of Exercise per Session: 30 min  ?Stress: No Stress Concern Present  ? Feeling of Stress : Not at all  ?Social Connections: Not on file  ?  ? ?Family History: ?The patient's family history includes ADD / ADHD in her daughter and son; Alzheimer's disease in her father; Atrial fibrillation in her father and mother; Breast cancer (age of onset: 44) in her sister; Cancer in her maternal grandfather and paternal grandmother; Cancer (age of onset: 14) in her sister; Cataracts in her father; Colon cancer in her maternal grandmother; Colon polyps in her father; Dementia in her father and mother; Heart attack in her maternal grandfather; Hypertension in her brother, father, and mother; Stroke in her mother. There is no history of Esophageal cancer, Rectal cancer, or Stomach cancer. ? ?ROS:   ?Please see the history of present illness.    ? ?All  other systems reviewed and are negative. ? ?EKGs/Labs/Other Studies Reviewed:   ? ?CTA Abdomen 11/03/21 ?Fibromuscular dysplasia of the bilateral renal arteries. ?  ?Left renal artery aneurysm at the division point involving left ?ventral branch ramus. Maximum dimension on the coronal images ?estimated 15 mm. This appears relatively unchanged dating to the ?chest CT of 12/10/2017, with near circumferential mural ?calcification. ?  ?Very minimal aortic atherosclerosis. ? ?Echo 10/24/21 ? 1. Left ventricular ejection fraction, by estimation, is 60 to 65%. The  ?left ventricle has normal function. The left ventricle has no regional  ?wall motion abnormalities. Left ventricular diastolic parameters were  ?normal.  ? 2. Right ventricular systolic function is normal. The right ventricular  ?size is normal.  ? 3. Left atrial size was severely dilated.  ? 4. The mitral valve is abnormal. Trivial mitral valve regurgitation. No   ?evidence of mitral stenosis.  ? 5. The aortic valve is tricuspid. Aortic valve regurgitation is not  ?visualized. No aortic stenosis is present.  ? 6. The inferior vena cava is normal in size with greater

## 2022-03-09 ENCOUNTER — Other Ambulatory Visit (HOSPITAL_BASED_OUTPATIENT_CLINIC_OR_DEPARTMENT_OTHER): Payer: Self-pay | Admitting: *Deleted

## 2022-03-09 DIAGNOSIS — I10 Essential (primary) hypertension: Secondary | ICD-10-CM

## 2022-03-09 DIAGNOSIS — E782 Mixed hyperlipidemia: Secondary | ICD-10-CM

## 2022-03-10 ENCOUNTER — Encounter (HOSPITAL_BASED_OUTPATIENT_CLINIC_OR_DEPARTMENT_OTHER): Payer: Self-pay

## 2022-03-10 ENCOUNTER — Telehealth: Payer: Self-pay | Admitting: Cardiovascular Disease

## 2022-03-10 NOTE — Telephone Encounter (Signed)
Responded via MCM

## 2022-03-10 NOTE — Telephone Encounter (Signed)
Patient states she is returning a call received yesterday around 5:00 PM, she assumes it may have been regarding her appointment with Dr. Oval Linsey, but I do not see anything documented. Is anyone able to advise on this? ?

## 2022-04-01 ENCOUNTER — Other Ambulatory Visit (HOSPITAL_COMMUNITY): Payer: Self-pay

## 2022-04-12 ENCOUNTER — Other Ambulatory Visit (HOSPITAL_COMMUNITY): Payer: Self-pay

## 2022-05-21 IMAGING — CT CT CTA ABD/PEL W/CM AND/OR W/O CM
2 of 4 series · 11 of 32 positions shown, 16 images · IV contrast (APPLIED)
Comparison: Chest CT 12/10/2017, 07/10/2017

CLINICAL DATA: 55-year-old female with a history of renal artery
stenosis

EXAM:
CTA ABDOMEN AND PELVIS WITHOUT AND WITH CONTRAST
TECHNIQUE: Multidetector CT imaging of the abdomen and pelvis was performed
using the standard protocol during bolus administration of
intravenous contrast. Multiplanar reconstructed images and MIPs were
obtained and reviewed to evaluate the vascular anatomy.
CONTRAST:  75mL KGTHRK-J4I IOPAMIDOL (KGTHRK-J4I) INJECTION 76%

[Series 4: renal angio · axial · 0.83mm/px · z∈[+78,+381]mm · 6 of 169 slices shown]
[im 17/169  soft-tissue]
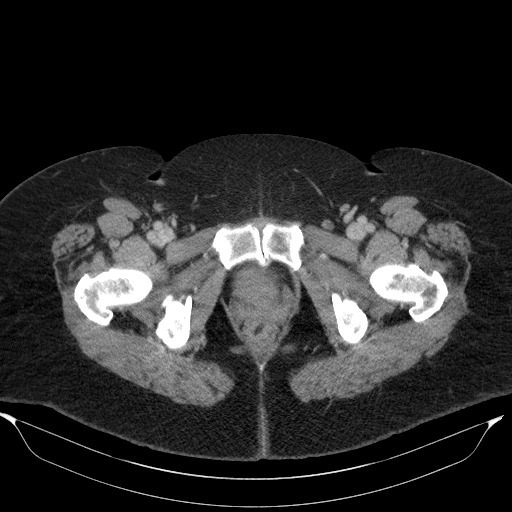
[im 34/169  soft-tissue]
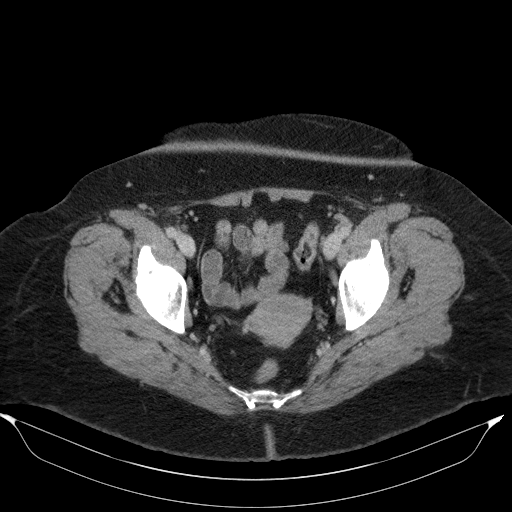
[im 51/169  soft-tissue]
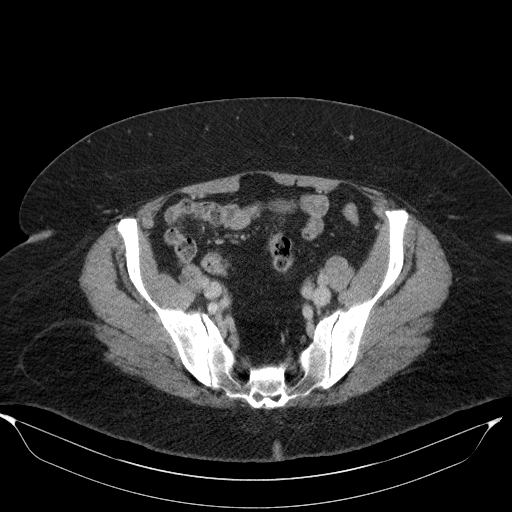
[im 68/169  soft-tissue]
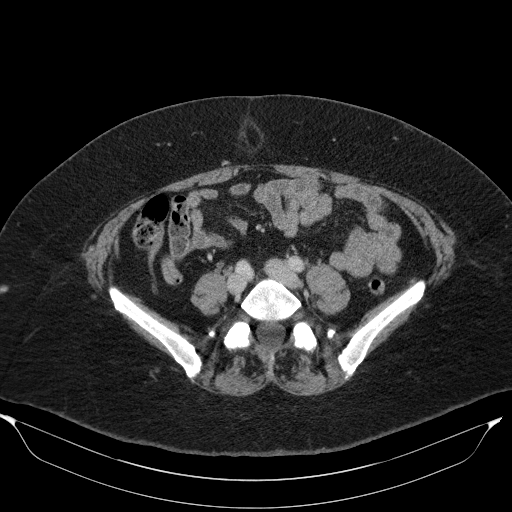
[im 101/169  soft-tissue]
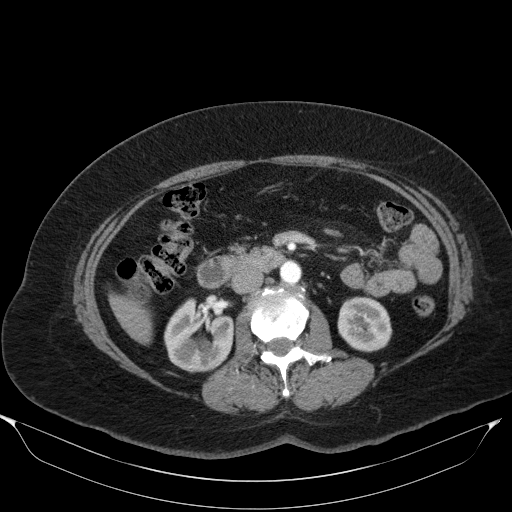
[im 118/169  soft-tissue]
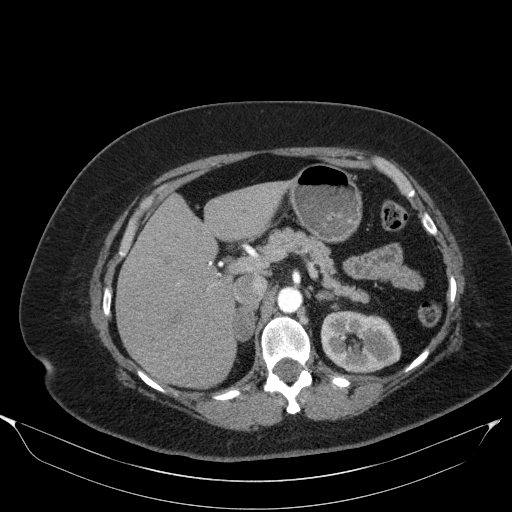

[Series 5: venous · axial · portal-venous · 0.83mm/px · z∈[+109,+449]mm · 5 of 102 slices shown, 10 images]
[im 17/102  soft-tissue]
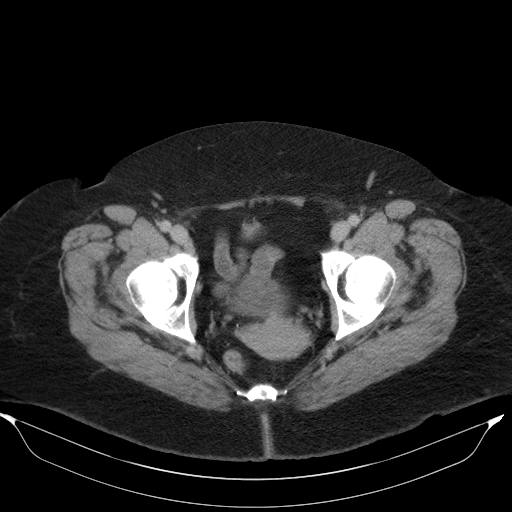
[im 17/102  bone]
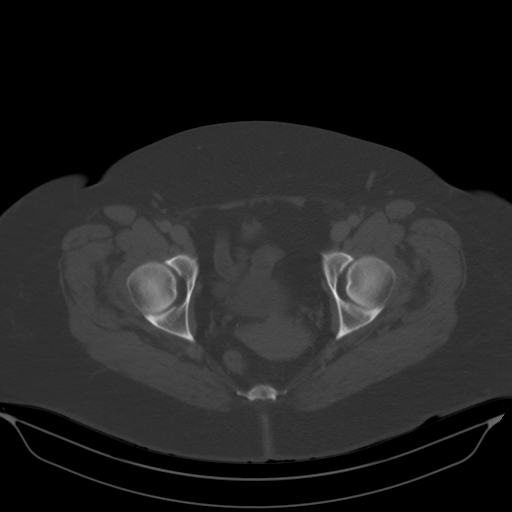
[im 34/102  soft-tissue]
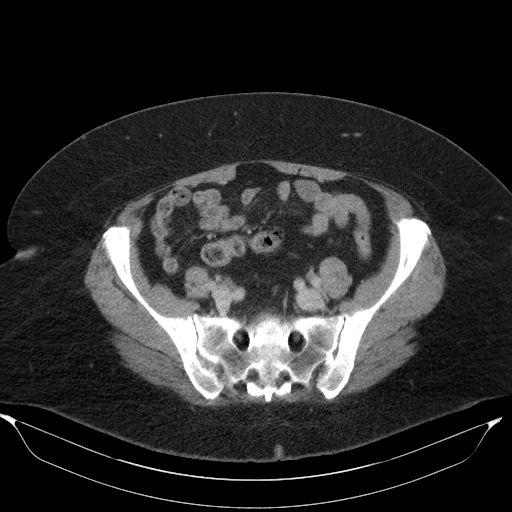
[im 34/102  lung]
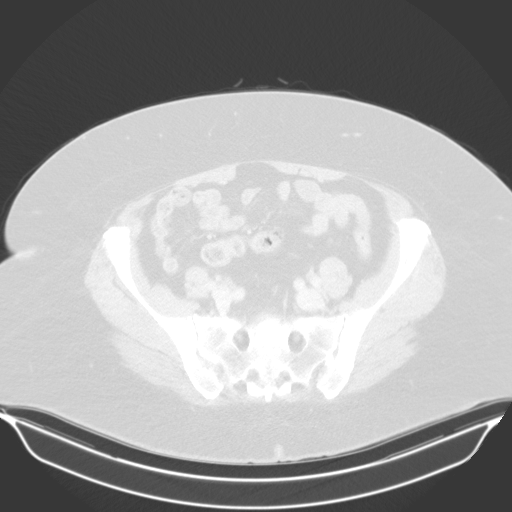
[im 51/102  soft-tissue]
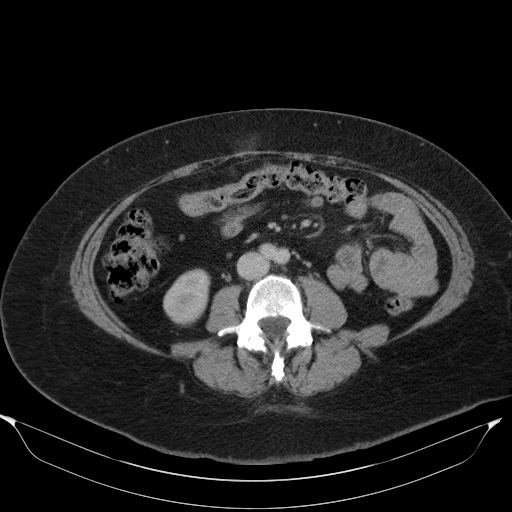
[im 51/102  lung]
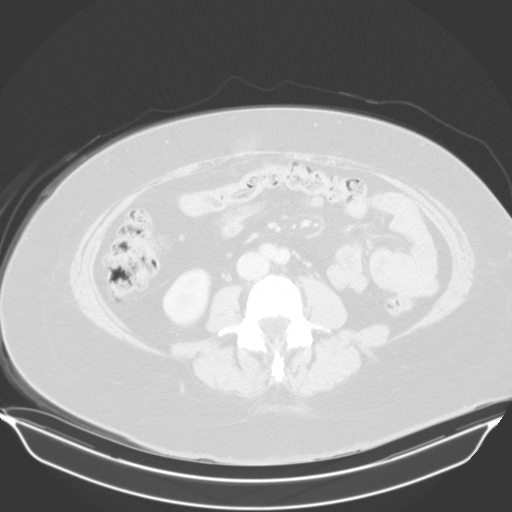
[im 68/102  soft-tissue]
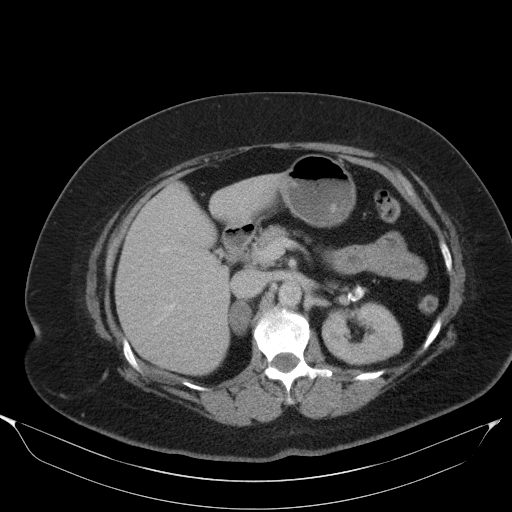
[im 68/102  lung]
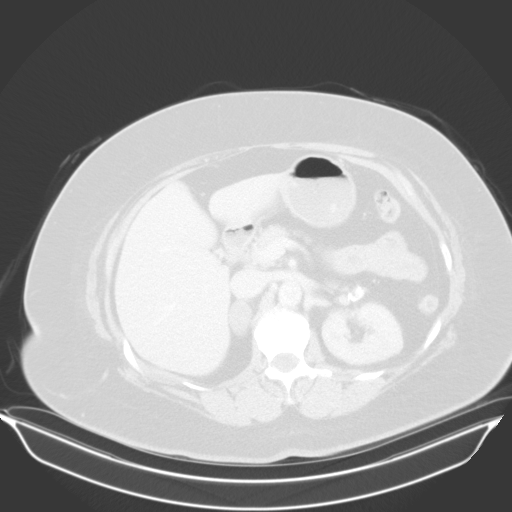
[im 85/102  soft-tissue]
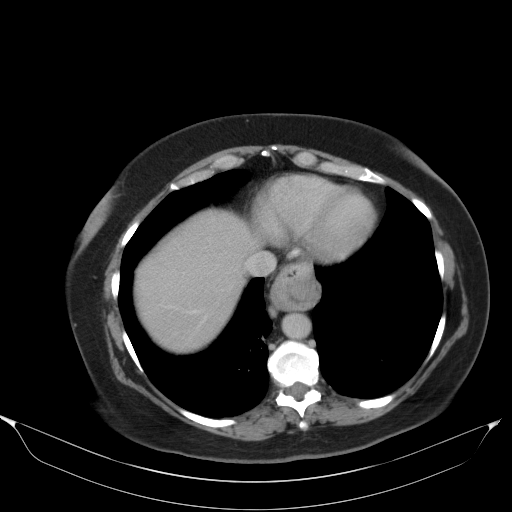
[im 85/102  lung]
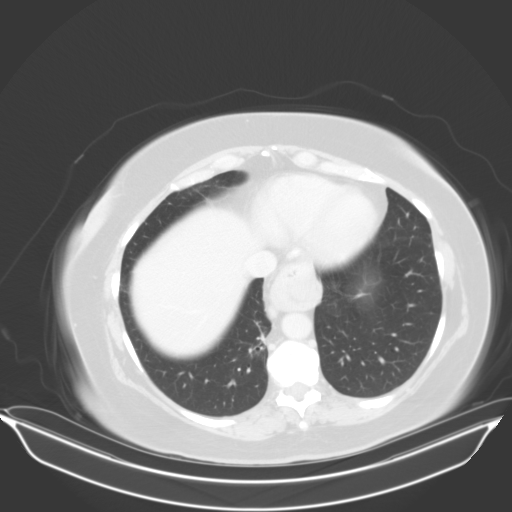

[11 of 32 positions shown; findings below may reference images not displayed]

FINDINGS: VASCULAR

Aorta: Unremarkable course, caliber, contour of the abdominal aorta.
No dissection, aneurysm, or periaortic fluid. Minimal calcified
atherosclerotic changes of the infrarenal abdominal aorta.

Celiac: Patent, with no significant atherosclerotic changes.

SMA: Patent, with no significant atherosclerotic changes.

Renals:

- Right: Single right renal artery. There is a typical fibromuscular
dysplasia beading/accordion configuration along the length of the
main right renal artery from beyond the origin extending to the
division of the ventral/dorsal rami in the hilum of the right
kidney. No pre hilar branches.

- Left: Single left renal artery. The proximal left renal artery is
unremarkable course caliber and configuration, with the distal left
renal artery demonstrating a typical fibromuscular dysplasia
accordion/beading configuration proximal to the ventral/dorsal rami
at the hilum. There is an aneurysm measuring approximately 15 mm in
greatest diameter on the coronal images arising from the ventral
ramus division point. Near circumferential calcifications, with some
laminar calcifications/mural thrombus. No inflammatory changes.
There appears to be unchanged diameter comparing to the CT chest of
12/10/2017.

IMA: Inferior mesenteric artery is patent.

Right lower extremity:

Unremarkable course, caliber, and contour of the right iliac system.
No aneurysm, dissection, or occlusion. Hypogastric artery is patent.
Common femoral artery patent. Proximal SFA and profunda femoris
patent.

Left lower extremity:

Unremarkable course, caliber, and contour of the left iliac system.
No aneurysm, dissection, or occlusion. Hypogastric artery is patent.
Common femoral artery patent. Proximal SFA and profunda femoris
patent.

Veins: Unremarkable appearance of the venous system.

Review of the MIP images confirms the above findings.

NON-VASCULAR

Lower chest: No acute finding of the lower chest. Similar
atelectasis/scarring with associated bronchiectasis of the medial
right lower lobe, unchanged from the comparison. Small nodules
measuring 2 mm-3 mm of the right lower lobe are unchanged dating to
the CT of 12/10/2017, 07/10/2017 and most likely
benign/inflammatory. Hiatal hernia.

Hepatobiliary: Unremarkable appearance of the liver. Unremarkable
gall bladder.

Pancreas: Unremarkable.

Spleen: Unremarkable.

Adrenals/Urinary Tract:

- Right adrenal gland: Nodule of the right adrenal gland measures 28
mm, previously 25 mm on chest CT, with prior Hounsfield units
compatible with adenoma.

- Left adrenal gland: Unremarkable.

- Right kidney: No hydronephrosis, nephrolithiasis, inflammation, or
ureteral dilation. No focal lesion.

- Left Kidney: No hydronephrosis, nephrolithiasis, inflammation, or
ureteral dilation. No focal lesion.

- Urinary Bladder: Urinary bladder relatively decompressed

Stomach/Bowel:

- Stomach: Hiatal hernia with otherwise unremarkable stomach

- Small bowel: Small bowel decompressed with no abnormal distension.
No focal wall thickening. There is partially fecalized small bowel
within the right lower quadrant distally, potentially representing
slow transit of material. No evidence of obstruction.

- Appendix: Normal.

- Colon: Moderate stool burden.  No inflammatory changes.

Lymphatic: No retroperitoneal adenopathy. No inguinal adenopathy.
Small lymph nodes within the mesentery, nonspecific and not enlarged
most likely reactive.

Mesenteric: No free fluid or air. No mesenteric adenopathy.

Reproductive: Unremarkable appearance of the uterus/adnexa

Other: Fat containing umbilical hernia.

Musculoskeletal: Degenerative changes of the visualized spine. No
bony canal narrowing. No acute displaced fracture. No significant
degenerative changes of the hips.
IMPRESSION: Fibromuscular dysplasia of the bilateral renal arteries.

Left renal artery aneurysm at the division point involving left
ventral branch ramus. Maximum dimension on the coronal images
estimated 15 mm. This appears relatively unchanged dating to the
chest CT of 12/10/2017, with near circumferential mural
calcification.

Very minimal aortic atherosclerosis.

Additional ancillary findings as above.

## 2022-05-29 ENCOUNTER — Other Ambulatory Visit (HOSPITAL_COMMUNITY): Payer: Self-pay

## 2022-05-29 MED ORDER — TRIAMCINOLONE ACETONIDE 0.1 % EX CREA
TOPICAL_CREAM | CUTANEOUS | 1 refills | Status: DC
  Filled 2022-05-29: qty 45, 14d supply, fill #0

## 2022-05-30 ENCOUNTER — Other Ambulatory Visit (HOSPITAL_COMMUNITY): Payer: Self-pay

## 2022-06-09 ENCOUNTER — Other Ambulatory Visit (HOSPITAL_COMMUNITY): Payer: Self-pay

## 2022-06-09 MED ORDER — ESCITALOPRAM OXALATE 10 MG PO TABS
10.0000 mg | ORAL_TABLET | Freq: Every day | ORAL | 3 refills | Status: AC
  Filled 2022-06-09: qty 90, 90d supply, fill #0
  Filled 2022-09-14: qty 90, 90d supply, fill #1
  Filled 2022-12-21: qty 90, 90d supply, fill #2
  Filled 2023-03-15: qty 90, 90d supply, fill #3

## 2022-06-09 MED ORDER — CARVEDILOL 3.125 MG PO TABS
3.1250 mg | ORAL_TABLET | Freq: Two times a day (BID) | ORAL | 3 refills | Status: DC
  Filled 2022-06-09: qty 180, 90d supply, fill #0
  Filled 2022-11-02: qty 180, 90d supply, fill #1
  Filled 2023-03-15: qty 180, 90d supply, fill #2

## 2022-06-15 ENCOUNTER — Other Ambulatory Visit: Payer: Self-pay | Admitting: General Surgery

## 2022-06-15 DIAGNOSIS — Z803 Family history of malignant neoplasm of breast: Secondary | ICD-10-CM

## 2022-06-21 ENCOUNTER — Other Ambulatory Visit (HOSPITAL_COMMUNITY): Payer: Self-pay

## 2022-06-21 MED ORDER — PREDNISONE 10 MG PO TABS
ORAL_TABLET | ORAL | 0 refills | Status: DC
  Filled 2022-06-21: qty 42, 12d supply, fill #0

## 2022-06-26 ENCOUNTER — Encounter (HOSPITAL_BASED_OUTPATIENT_CLINIC_OR_DEPARTMENT_OTHER): Payer: Self-pay | Admitting: Cardiovascular Disease

## 2022-06-26 ENCOUNTER — Encounter (HOSPITAL_BASED_OUTPATIENT_CLINIC_OR_DEPARTMENT_OTHER): Payer: Self-pay

## 2022-06-26 ENCOUNTER — Ambulatory Visit (INDEPENDENT_AMBULATORY_CARE_PROVIDER_SITE_OTHER): Payer: 59 | Admitting: Cardiovascular Disease

## 2022-06-26 ENCOUNTER — Other Ambulatory Visit (HOSPITAL_COMMUNITY): Payer: Self-pay

## 2022-06-26 VITALS — BP 116/70 | HR 58 | Ht 68.5 in | Wt 256.6 lb

## 2022-06-26 DIAGNOSIS — E782 Mixed hyperlipidemia: Secondary | ICD-10-CM | POA: Diagnosis not present

## 2022-06-26 DIAGNOSIS — I7090 Unspecified atherosclerosis: Secondary | ICD-10-CM

## 2022-06-26 DIAGNOSIS — I10 Essential (primary) hypertension: Secondary | ICD-10-CM | POA: Diagnosis not present

## 2022-06-26 DIAGNOSIS — I480 Paroxysmal atrial fibrillation: Secondary | ICD-10-CM | POA: Diagnosis not present

## 2022-06-26 DIAGNOSIS — I471 Supraventricular tachycardia: Secondary | ICD-10-CM

## 2022-06-26 DIAGNOSIS — I773 Arterial fibromuscular dysplasia: Secondary | ICD-10-CM

## 2022-06-26 HISTORY — DX: Unspecified atherosclerosis: I70.90

## 2022-06-26 NOTE — Patient Instructions (Signed)
Medication Instructions:  Your physician recommends that you continue on your current medications as directed. Please refer to the Current Medication list given to you today.   *If you need a refill on your cardiac medications before your next appointment, please call your pharmacy*  Lab Work: FASTING LP/CMET IN 4 MONTHS ABOUT 1 WEEK PRIOR TO FOLLOW UP   If you have labs (blood work) drawn today and your tests are completely normal, you will receive your results only by: [place] (if you have MyChart) OR A paper copy in the mail If you have any lab test that is abnormal or we need to change your treatment, we will call you to review the results.  Testing/Procedures: NONE  Follow-Up: At [facility], you and your health needs are our priority.  As part of our continuing mission to provide you with exceptional heart care, we have created designated Provider Care Teams.  These Care Teams include your primary Cardiologist (physician) and Advanced Practice Providers (APPs -  Physician Assistants and Nurse Practitioners) who all work together to provide you with the care you need, when you need it.  We recommend signing up for the patient portal called "MyChart".  Sign up information is provided on this After Visit Summary.  MyChart is used to connect with patients for Virtual Visits (Telemedicine).  Patients are able to view lab/test results, encounter notes, upcoming appointments, etc.  Non-urgent messages can be sent to your provider as well.   To learn more about what you can do with MyChart, go to [email or web address].    Your next appointment:   4 month(s)  The format for your next appointment:   In Person  Provider:   Skeet Latch, MD or Laurann Montana, NP{  WORK HARDER ON DIET AND EXERCISE

## 2022-06-26 NOTE — Assessment & Plan Note (Signed)
She had mild atherosclerosis of her cerebral vasculature.  Lipids are improving with diet and exercise.  Ideally her LDL should be less than 70.  We did discuss statins but she would like to keep working on diet and exercise first.  Repeat lipids and a CMP in 4 months.

## 2022-06-26 NOTE — Assessment & Plan Note (Signed)
Paroxysmal atrial fibrillation.  No recent episodes.  She has flecainide to take as needed.  She is not on chronic anticoagulation.

## 2022-06-26 NOTE — Assessment & Plan Note (Signed)
Renal artery fibromuscular dysplasia.  Blood pressure has been controlled, though it has required multiple agents.  For now continue to monitor and no need for angioplasty at this time.  Continue carvedilol, chlorthalidone, diltiazem, and valsartan.  She is no longer taking spironolactone.  She follows with Dr. Gwenlyn Found in 1 year.

## 2022-06-26 NOTE — Progress Notes (Signed)
Advanced Hypertension Clinic:    Date:  06/26/2022   ID:  Misty Mahoney, DOB July 16, 1966, MRN [id number]  PCP:  Eunice Blase, MD  Cardiologist:  Virl Axe, MD   Referring MD: Eunice Blase, MD   CC: Hypertension  History of Present Illness:    Misty Mahoney is a 56 y.o. female with a hx of anemia, depression, diabetes, GERD, hyperlipidemia, hypertension, obesity, OSA on CPAP, paroxysmal atrial fibrillation, and SVT s/p RCFA, here for follow-up in the Advanced Hypertension Clinic. In the past her blood pressure has been difficult to control. Most recently carvedilol was added and her blood pressure has been stable. She had an Echo in 2018 that revealed LVEF 60-65% with grade 2 diastolic dysfunction. She reports her blood pressure was elevated after the birth of her son over 20 years ago. She has been taking hypertension medications for +15 years but stays elevated 761P and above systolic.    [name] takes flecainide as needed for her Afib episodes. Her labs 12/2020 showed GFR  92, Creatine  0.73, Potassium  4.1, TSH 1.1, A1C 6, Cholesterol 189, and LDL 109. Last year she began Ozempic and lost 10 pounds, but this was discontinued after she developed abdominal pain. She has an adrenal adenoma but testing was negative for hyperaldosteronism and pheochromocytoma. Spironolactone was added and doxazosin was discontinued. She was referred to the PREP exercise program. She had renal artery dopplers that showed >60% stenosis on the right. She had an abdominal CT 10/2021 that demonstrated fibromuscular dysplasia in the R renal artery and the L distal renal artery. She saw Dr. Gwenlyn Found 12/2021 and elected to continue with medical management.  She saw our pharmacist 08/2021 and her blood pressure was better controlled.   She completed PREP and did well.  She initially continued her exercise but took the month of July off because she was at [employer].  She has no exertional chest pain or shortness of  breath.  At home her blood pressure has been in 130s over 70s.  She stopped spironolactone due to leg cramps.  It helped her leg cramps.  She sees a functional medicine specialist who ran multiple cholesterol labs and CRP.  Her CRP has been consistently elevated around 11.  She denies any rashes other than poison ivy and no significant joint disease.  She notes that she had CRP tested in the past and it was elevated even when she did not have her rash.  Uric acid was mildly elevated at 7.3.  Her cholesterol levels have trended in the right direction with diet and exercise.  She has not had any episodes of atrial fibrillation.  She occasionally has some palpitations were after eating but otherwise has been well.   Previous antihypertensives: Lisinopril - Severe Cough Irbesartan - Ineffective  Past Medical History:  Diagnosis Date   Anemia    gestational   Arterial fibromuscular dysplasia ([place]) 11/29/2021   Atherosclerosis 06/26/2022   Atypical mole 07/24/2012   right abdomen moderate   Atypical mole 07/10/2018   right supra pubic moderate   Chicken pox as a child   Depression 06/15/2015   DM, gestational, diet controlled 04/10/2014   insulin resistant- A1C5.7    GERD (gastroesophageal reflux disease)    undx'd GERD    Hyperlipidemia 12/20/2015   Hypertension    Obesity, unspecified 04/10/2014   OSA (obstructive sleep apnea)    uses cpap   Osteoarthritis    Paroxysmal atrial fibrillation ([place])  Prediabetes    SVT (supraventricular tachycardia) ([place])    s/p RFCA 09/26/12   SVT (supraventricular tachycardia) ([place])    a. s/p slow pathway modification 2013 Dr Lovena Le   Vitamin D deficiency 12/26/2015    Past Surgical History:  Procedure Laterality Date   ADENOIDECTOMY     BREAST LUMPECTOMY WITH RADIOACTIVE SEED LOCALIZATION Right 08/03/2020   Procedure: RIGHT BREAST EXCISIONAL BIOPSY WITH RADIOACTIVE SEED LOCALIZATION;  Surgeon: Rolm Bookbinder, MD;  Location: [place];  Service: General;  Laterality: Right;   ENDOVENOUS ABLATION SAPHENOUS VEIN W/ LASER Bilateral    Thermal ablation   KNEE ARTHROSCOPY WITH MENISCAL REPAIR Left 08/25/2016   Procedure: KNEE ARTHROSCOPY WITH partial medial meniscectomy, chondroplasty;  Surgeon: Melrose Nakayama, MD;  Location: [place];  Service: Orthopedics;  Laterality: Left;  KNEE ARTHROSCOPY WITH partial medial meniscectomy, chondroplasty   PALATE / UVULA BIOPSY / EXCISION     TONSILLECTOMY  56 yrs old   V-TACH ABLATION N/A 09/26/2012   Procedure: V-TACH ABLATION;  Surgeon: Evans Lance, MD;  Location: [facility] C[facility];  Service: Cardiovascular;  Laterality: N/A;   WISDOM TOOTH EXTRACTION  56 yrs old    Current Medications: Current Meds  Medication Sig   carvedilol (COREG) 3.125 MG tablet Take 1 tablet by mouth 2 times daily.   chlorthalidone (HYGROTON) 25 MG tablet Take 1 tablet by mouth once daily   Cholecalciferol (VITAMIN D) 2000 units CAPS Take 1 capsule by mouth daily.   cyanocobalamin 1000 MCG tablet Take 1,000 mcg by mouth daily.   diltiazem (DILT-XR) 240 MG 24 hr capsule Take 1 capsule by mouth daily   escitalopram (LEXAPRO) 10 MG tablet Take 1 tablet by mouth daily.   flecainide (TAMBOCOR) 150 MG tablet Take 2 tablets (300 mg total) by mouth as needed.   Magnesium 250 MG TABS Take 1 tablet by mouth daily.   pyridoxine (B-6) 100 MG tablet Take 100 mg by mouth daily.   triamcinolone cream (KENALOG) 0.1 % Apply to affected areas twice daily as needed   valsartan (DIOVAN) 320 MG tablet Take 1 tablet by mouth once daily   [DISCONTINUED] spironolactone (ALDACTONE) 25 MG tablet Take 1 tablet by mouth once daily     Allergies:   Epinephrine, Lisinopril, and Sunflower oil   Social History   Socioeconomic History   Marital status: Significant Other    Spouse name: [name]   Number of children: 2   Years of education: Not on file   Highest education level: Not on file  Occupational History    Occupation: RN  Tobacco Use   Smoking status: Never   Smokeless tobacco: Never  Vaping Use   Vaping Use: Never used  Substance and Sexual Activity   Alcohol use: Yes    Comment: two or three times a year   Drug use: No   Sexual activity: Yes    Partners: Male    Comment: lives with kids, significant other and step daughter. minimizing dairy and gluten, exercise  Other Topics Concern   Not on file  Social History Narrative   Not on file   Social Determinants of Health   Financial Resource Strain: Low Risk  (07/19/2021)   Overall Financial Resource Strain (CARDIA)    Difficulty of Paying Living Expenses: Not hard at all  Food Insecurity: No Food Insecurity (07/19/2021)   Hunger Vital Sign    Worried About Running Out of Food in the Last Year: Never true  Ran Out of Food in the Last Year: Never true  Transportation Needs: No Transportation Needs (07/19/2021)   PRAPARE - Hydrologist (Medical): No    Lack of Transportation (Non-Medical): No  Physical Activity: Insufficiently Active (07/19/2021)   Exercise Vital Sign    Days of Exercise per Week: 2 days    Minutes of Exercise per Session: 30 min  Stress: No Stress Concern Present (07/19/2021)   [place]    Feeling of Stress : Not at all  Social Connections: Not on file     Family History: The patient's family history includes ADD / ADHD in her daughter and son; Alzheimer's disease in her father; Atrial fibrillation in her father and mother; Breast cancer (age of onset: 70) in her sister; Cancer in her maternal grandfather and paternal grandmother; Cancer (age of onset: 80) in her sister; Cataracts in her father; Colon cancer in her maternal grandmother; Colon polyps in her father; Dementia in her father and mother; Heart attack in her maternal grandfather; Hypertension in her brother, father, and mother; Stroke in her mother. There is  no history of Esophageal cancer, Rectal cancer, or Stomach cancer.  ROS:   Please see the history of present illness.    (+) L Ear ringing (+) Stress All other systems reviewed and are negative.  EKGs/Labs/Other Studies Reviewed:    CTA Abdomen 12-Nov-2021 Fibromuscular dysplasia of the bilateral renal arteries.   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 Maximum dimension on the coronal images estimated 15 mm. This appears relatively unchanged dating to the chest CT of 12/10/2017, with near circumferential mural calcification.   Very minimal aortic atherosclerosis.  Echo 10/24/21  1. Left ventricular ejection fraction, by estimation, is 60 to 65%. The  left ventricle has normal function. The left ventricle has no regional  wall motion abnormalities. Left ventricular diastolic parameters were  normal.   2. Right ventricular systolic function is normal. The right ventricular  size is normal.   3. Left atrial size was severely dilated.   4. The mitral valve is abnormal. Trivial mitral valve regurgitation. No  evidence of mitral stenosis.   5. The aortic valve is tricuspid. Aortic valve regurgitation is not  visualized. No aortic stenosis is present.   6. The inferior vena cava is normal in size with greater than 50%  respiratory variability, suggesting right atrial pressure of 3 mmHg.  Bilateral Renal Artery 08/05/21: Renal:     Right: Normal size right kidney. Abnormal right Resistive Index.         Normal cortical thickness of right kidney. Evidence of a >         60% stenosis of the right renal artery. RRV flow present.  Left:  Normal size of left kidney. Abnormal left Resisitve Index.         Normal cortical thickness of the left kidney. No evidence of         left renal artery stenosis. LRV flow present.  Mesenteric:  Normal Celiac artery and Superior Mesenteric artery findings.   TTE 07/10/2017: - Left ventricle: The cavity  size was normal. Wall thickness was    normal. Systolic function was normal. The estimated ejection    fraction was in the range of 60% to 65%. Wall motion was normal;    there were no regional wall motion abnormalities. Features are    consistent with a pseudonormal  left ventricular filling pattern,    with concomitant abnormal relaxation and increased filling    pressure (grade 2 diastolic dysfunction).  -------------------------------------------------------------------  Labs, prior tests, procedures, and surgery:  Transthoracic echocardiography (08/23/2015).     EF was 65% and PA  pressure was 35 (systolic).    EKG:  EKG was not ordered today 07/19/2021: Sinus rhythm. Rate 67 bpm. LVH  Recent Labs: 08/12/2021: BUN 29; Potassium 4.1; Sodium 143 12/06/2021: Creatinine, Ser 1.30   Recent Lipid Panel    Component Value Date/Time   CHOL 175 07/17/2019 0900   CHOL 177 08/22/2018 1015   TRIG 93.0 07/17/2019 0900   HDL 57.90 07/17/2019 0900   HDL 55 08/22/2018 1015   CHOLHDL 3 07/17/2019 0900   VLDL 18.6 07/17/2019 0900   LDLCALC 99 07/17/2019 0900   LDLCALC 100 (H) 08/22/2018 1015   03/2021: Total cholesterol 189, triglycerides 86, HDL 63, LDL 109 Creatinine 0.7, BUN 20, potassium 4.1 TSH 1.1 Hemoglobin A1c 6.0%.  Physical Exam:   VS:  BP 116/70 (BP Location: Right Arm, Patient Position: Sitting, Cuff Size: Large)   Pulse (!) 58   Ht 5' 8.5" (1.74 m)   Wt 256 lb 9.6 oz (116.4 kg)   LMP 06/24/2018   SpO2 94%   BMI 38.45 kg/m  , BMI Body mass index is 38.45 kg/m. GENERAL:  Well appearing HEENT: Pupils equal round and reactive, fundi not visualized, oral mucosa unremarkable NECK:  No jugular venous distention, waveform within normal limits, carotid upstroke brisk and symmetric, no bruits, no thyromegaly LUNGS:  Clear to auscultation bilaterally HEART:  RRR.  PMI not displaced or sustained,S1 and S2 within normal limits, no S3, no S4, no clicks, no rubs, no murmurs ABD:   Flat, positive bowel sounds normal in frequency in pitch, no bruits, no rebound, no guarding, no midline pulsatile mass, no hepatomegaly, no splenomegaly EXT:  2 plus pulses throughout, no edema, no cyanosis no clubbing SKIN:  No rashes no nodules NEURO:  Cranial nerves II through XII grossly intact, motor grossly intact throughout PSYCH:  Cognitively intact, oriented to person place and time    ASSESSMENT/PLAN:    Arterial fibromuscular dysplasia (HCC) Renal artery fibromuscular dysplasia.  Blood pressure has been controlled, though it has required multiple agents.  For now continue to monitor and no need for angioplasty at this time.  Continue carvedilol, chlorthalidone, diltiazem, and valsartan.  She is no longer taking spironolactone.  She follows with Dr. Gwenlyn Found in 1 year.  Atrial fibrillation Paroxysmal atrial fibrillation.  No recent episodes.  She has flecainide to take as needed.  She is not on chronic anticoagulation.  Atherosclerosis She had mild atherosclerosis of her cerebral vasculature.  Lipids are improving with diet and exercise.  Ideally her LDL should be less than 70.  We did discuss statins but she would like to keep working on diet and exercise first.  Repeat lipids and a CMP in 4 months.  SVT (supraventricular tachycardia) Stable on diltiazem and carvedilol.     Screening for Secondary Hypertension:     07/19/2021    9:12 AM 08/18/2021    4:24 PM  Causes  Drugs/Herbals Screened      - Comments NSAIDS twice per week   Renovascular HTN Screened Screened     - Comments Check renal artery Dopplers right RAS, has appt with Dr. Gwenlyn Found in October  Sleep Apnea Screened      - Comments on CPAP   Thyroid Disease Screened      -  Comments repeat TSH   Hyperaldosteronism Screened Screened     - Comments check renal artery Dopplers. WNL  Pheochromocytoma Screened      - Comments Negative in 2019.  No symtpoms.   Cushing's Syndrome Screened      - Comments cortisol  normal     Relevant Labs/Studies:    Latest Ref Rng & Units 12/06/2021   11:13 AM 08/12/2021    4:23 PM 07/30/2020    2:55 PM  Basic Labs  Sodium 134 - 144 mmol/L  143  140   Potassium 3.5 - 5.2 mmol/L  4.1  3.4   Creatinine 0.44 - 1.00 mg/dL 1.30  0.97  0.91        Latest Ref Rng & Units 07/17/2019    9:00 AM 03/05/2018    9:01 AM  Thyroid   TSH 0.35 - 4.50 uIU/mL 1.39  1.900             Latest Ref Rng & Units 12/13/2017   10:29 AM  Cortisol  Cortisol  ug/dL 4.2        08/05/2021   10:13 AM  Renovascular   Renal Artery [place] Completed Yes       Disposition:    FU with Psalm Arman C. Oval Linsey, MD, [facility] in 4 months.   Medication Adjustments/Labs and Tests Ordered: Current medicines are reviewed at length with the patient today.  Concerns regarding medicines are outlined above.  Orders Placed This Encounter  Procedures   Lipid panel   Comprehensive metabolic panel    No orders of the defined types were placed in this encounter.     Signed, Skeet Latch, MD  06/26/2022 9:30 AM    [place]

## 2022-06-26 NOTE — Assessment & Plan Note (Signed)
Stable on diltiazem and carvedilol.

## 2022-06-30 ENCOUNTER — Ambulatory Visit
Admission: RE | Admit: 2022-06-30 | Discharge: 2022-06-30 | Disposition: A | Discharge status: Home / Self Care | Payer: 59 | Source: Ambulatory Visit | Attending: General Surgery | Admitting: General Surgery

## 2022-06-30 DIAGNOSIS — Z124 Encounter for screening for malignant neoplasm of cervix: Secondary | ICD-10-CM | POA: Diagnosis not present

## 2022-06-30 DIAGNOSIS — Z1239 Encounter for other screening for malignant neoplasm of breast: Secondary | ICD-10-CM | POA: Diagnosis not present

## 2022-06-30 DIAGNOSIS — Z01419 Encounter for gynecological examination (general) (routine) without abnormal findings: Secondary | ICD-10-CM | POA: Diagnosis not present

## 2022-06-30 DIAGNOSIS — Z803 Family history of malignant neoplasm of breast: Secondary | ICD-10-CM

## 2022-06-30 MED ORDER — GADOBUTROL 1 MMOL/ML IV SOLN
10.0000 mL | Freq: Once | INTRAVENOUS | Status: AC | PRN
  Administered 2022-06-30: 10 mL via INTRAVENOUS

## 2022-07-10 DIAGNOSIS — Z1239 Encounter for other screening for malignant neoplasm of breast: Secondary | ICD-10-CM | POA: Diagnosis not present

## 2022-07-10 DIAGNOSIS — Z803 Family history of malignant neoplasm of breast: Secondary | ICD-10-CM | POA: Diagnosis not present

## 2022-07-27 ENCOUNTER — Encounter (HOSPITAL_BASED_OUTPATIENT_CLINIC_OR_DEPARTMENT_OTHER): Payer: Self-pay

## 2022-08-02 ENCOUNTER — Encounter: Payer: Self-pay | Admitting: Primary Care

## 2022-08-02 ENCOUNTER — Ambulatory Visit (INDEPENDENT_AMBULATORY_CARE_PROVIDER_SITE_OTHER): Payer: 59 | Admitting: Primary Care

## 2022-08-02 VITALS — BP 138/76 | HR 56 | Temp 97.9°F | Ht 68.5 in | Wt 260.4 lb

## 2022-08-02 DIAGNOSIS — G4733 Obstructive sleep apnea (adult) (pediatric): Secondary | ICD-10-CM

## 2022-08-02 NOTE — Progress Notes (Signed)
$'@Patient'm$  ID: Misty Mahoney, female    DOB: 04-14-1966, 56 y.o.   MRN: [id number]  Chief Complaint  Patient presents with   Consult    Referring provider: Eunice Blase, MD  HPI: 56 year old female, never smoked.  Past medical history significant for A-fib, hypertension, arthrosclerosis, OSA.  Patient of Dr. Halford Chessman, last seen in 2019.  08/02/2022 Patient presents today for overdue follow-up for OSA.  She continues to consistently wear her CPAP every night. She is due for new CPAP machine. She uses a full face mask, however, has been experiencing irritation to bride of her nose. She thinks she may be tightening her mask too tight. She is interested in trying a memory foam full face mask if insurance will cover. Otherwise she will look into getting liners for her CPAP mask. She changes her mask every 2-3 months. No significant daytime sleepiness. She will get droawsy at times reading or watching TV. No problem at work or driving.   Airview download 05/04/22- 08/01/22 Usage 90/90 days Average usage 7 hour 23mns  Leaks 95th - 10.6L/min  AHI 2.75    Allergies  Allergen Reactions   Epinephrine     Hx of a fib   Lisinopril Cough   Sunflower Oil Swelling    Seeds     Immunization History  Administered Date(s) Administered   Influenza,inj,Quad PF,6+ Mos 08/20/2015, 09/09/2018, 07/17/2019   Influenza-Unspecified 08/20/2013, 07/31/2016   Tdap 11/20/2005, 07/31/2016    Past Medical History:  Diagnosis Date   Anemia    gestational   Arterial fibromuscular dysplasia ([place] 11/29/2021   Atherosclerosis 06/26/2022   Atypical mole 07/24/2012   right abdomen moderate   Atypical mole 07/10/2018   right supra pubic moderate   Chicken pox as a child   Depression 06/15/2015   DM, gestational, diet controlled 04/10/2014   insulin resistant- A1C5.7    GERD (gastroesophageal reflux disease)    undx'd GERD    Hyperlipidemia 12/20/2015   Hypertension    Obesity, unspecified 04/10/2014    OSA (obstructive sleep apnea)    uses cpap   Osteoarthritis    Paroxysmal atrial fibrillation (HCC)    Prediabetes    SVT (supraventricular tachycardia) ([facility]    s/p RFCA 09/26/12   SVT (supraventricular tachycardia) ([facility]    a. s/p slow pathway modification 2013 Dr TLovena Le  Vitamin D deficiency 12/26/2015    Tobacco History: Social History   Tobacco Use  Smoking Status Never   Passive exposure: Never  Smokeless Tobacco Never   Counseling given: Not Answered   Outpatient Medications Prior to Visit  Medication Sig Dispense Refill   carvedilol (COREG) 3.125 MG tablet Take 1 tablet by mouth 2 times daily. 180 tablet 3   chlorthalidone (HYGROTON) 25 MG tablet Take 1 tablet by mouth once daily 90 tablet 3   Cholecalciferol (VITAMIN D) 2000 units CAPS Take 1 capsule by mouth daily.     cyanocobalamin 1000 MCG tablet Take 1,000 mcg by mouth daily.     diltiazem (DILT-XR) 240 MG 24 hr capsule Take 1 capsule by mouth daily 90 capsule 20   escitalopram (LEXAPRO) 10 MG tablet Take 1 tablet by mouth daily. 90 tablet 3   flecainide (TAMBOCOR) 150 MG tablet Take 2 tablets (300 mg total) by mouth as needed. 90 tablet 3   Magnesium 250 MG TABS Take 1 tablet by mouth daily.     pyridoxine (B-6) 100 MG tablet Take 100 mg by mouth daily.     valsartan (  DIOVAN) 320 MG tablet Take 1 tablet by mouth once daily 90 tablet 3   triamcinolone cream (KENALOG) 0.1 % Apply to affected areas twice daily as needed (Patient not taking: Reported on 08/02/2022) 45 g 1   No facility-administered medications prior to visit.   Review of Systems  Review of Systems  Constitutional: Negative.  Negative for fatigue.  HENT: Negative.    Respiratory: Negative.    Psychiatric/Behavioral:  Negative for sleep disturbance.    Physical Exam  BP 138/76 (BP Location: Left Arm, Patient Position: Sitting, Cuff Size: Normal)   Pulse (!) 56   Temp 97.9 F (36.6 C) (Oral)   Ht 5' 8.5" (1.74 m)   Wt 260 lb 6.4 oz (118.1  kg)   LMP 06/24/2018   SpO2 95%   BMI 39.02 kg/m  Physical Exam Constitutional:      Appearance: Normal appearance. She is not ill-appearing.  HENT:     Head: Normocephalic and atraumatic.  Cardiovascular:     Rate and Rhythm: Normal rate and regular rhythm.  Pulmonary:     Effort: Pulmonary effort is normal.     Breath sounds: Normal breath sounds.  Musculoskeletal:        General: Normal range of motion.  Skin:    General: Skin is warm and dry.  Neurological:     General: No focal deficit present.     Mental Status: She is alert and oriented to person, place, and time. Mental status is at baseline.  Psychiatric:        Mood and Affect: Mood normal.        Behavior: Behavior normal.        Thought Content: Thought content normal.        Judgment: Judgment normal.      Lab Results:  CBC    Component Value Date/Time   WBC 8.7 12/10/2019 1548   RBC 3.87 12/10/2019 1548   HGB 11.3 (L) 12/10/2019 1548   HGB 11.3 03/05/2018 0901   HCT 33.6 (L) 12/10/2019 1548   HCT 34.2 03/05/2018 0917   PLT 258.0 12/10/2019 1548   PLT 331 03/05/2018 0901   MCV 86.9 12/10/2019 1548   MCV 89 03/05/2018 0901   MCH 28.8 03/05/2018 0901   MCH 28.6 02/07/2016 1022   MCHC 33.5 12/10/2019 1548   RDW 14.0 12/10/2019 1548   RDW 13.8 03/05/2018 0901   LYMPHSABS 1.9 03/05/2018 0901   MONOABS 0.3 02/07/2016 1022   EOSABS 0.2 03/05/2018 0901   BASOSABS 0.0 03/05/2018 0901    BMET    Component Value Date/Time   NA 143 08/12/2021 1623   K 4.1 08/12/2021 1623   CL 102 08/12/2021 1623   CO2 23 08/12/2021 1623   GLUCOSE 88 08/12/2021 1623   GLUCOSE 163 (H) 07/30/2020 1455   BUN 29 (H) 08/12/2021 1623   CREATININE 1.30 (H) 12/06/2021 1113   CREATININE 0.59 06/04/2014 1029   CALCIUM 9.3 08/12/2021 1623   GFRNONAA >60 07/30/2020 1455   GFRAA >60 07/30/2020 1455    BNP No results found for: "BNP"  ProBNP No results found for: "PROBNP"  Imaging: No results found.   Assessment &  Plan:   OSA (obstructive sleep apnea) - Severe OSA; HST 07/2012 >> AHI 43/hour. Patient is 100% compliant with CPAP use last 90 days. She reports benefit from use. No issues with pressure setting. She is experiencing some irritation to the bridge of her nose from her CPAP mask. We will place an order  for her to be provided new CPAP machine auto setting 5-15cm h20 and memory foam full face mask, if this is not effective she can try CPAP liners to help with skin irritation. Advised she continue to wear CPAP every night for min 4-6 hours or longer. FU 31-90 days after getting new CPAP machine for compliance check-can be virtual.      Martyn Ehrich, NP 08/03/2022

## 2022-08-02 NOTE — Patient Instructions (Signed)
Compliance is 100%. Continue to wear CPAP every night for 4-6 hours or longer. We will place an order for you to get new CPAP machine and see if they can provide you with memory foam full face mask. If unable to get this type of mask with your insurance you can look at purchasing out of pocket on [email or web address] or try mask liners   Orders: New CPAP machine- auto pressure setting 5-15cm h20 Memory foam full face mask size medium  Follow-up: Please call to schedule compliance check 31-90 days after getting new CPAP (can be virtual visit)

## 2022-08-03 NOTE — Assessment & Plan Note (Addendum)
-   Severe OSA; HST 07/2012 >> AHI 43/hour. Patient is 100% compliant with CPAP use last 90 days. She reports benefit from use. No issues with pressure setting. She is experiencing some irritation to the bridge of her nose from her CPAP mask. We will place an order for her to be provided new CPAP machine auto setting 5-15cm h20 and memory foam full face mask, if this is not effective she can try CPAP liners to help with skin irritation. Advised she continue to wear CPAP every night for min 4-6 hours or longer. FU 31-90 days after getting new CPAP machine for compliance check-can be virtual.

## 2022-08-04 NOTE — Progress Notes (Signed)
Reviewed and agree with assessment/plan.   Chesley Mires, MD [facility] Pulmonary/Critical Care 08/04/2022, 8:53 AM Pager:  [phone]

## 2022-09-07 DIAGNOSIS — G4733 Obstructive sleep apnea (adult) (pediatric): Secondary | ICD-10-CM | POA: Diagnosis not present

## 2022-09-15 ENCOUNTER — Other Ambulatory Visit (HOSPITAL_COMMUNITY): Payer: Self-pay

## 2022-09-26 DIAGNOSIS — E119 Type 2 diabetes mellitus without complications: Secondary | ICD-10-CM | POA: Diagnosis not present

## 2022-09-27 ENCOUNTER — Other Ambulatory Visit (HOSPITAL_BASED_OUTPATIENT_CLINIC_OR_DEPARTMENT_OTHER): Payer: Self-pay | Admitting: Family Medicine

## 2022-09-27 DIAGNOSIS — E785 Hyperlipidemia, unspecified: Secondary | ICD-10-CM

## 2022-10-02 ENCOUNTER — Other Ambulatory Visit (HOSPITAL_COMMUNITY): Payer: Self-pay

## 2022-10-02 MED ORDER — BENZONATATE 100 MG PO CAPS
ORAL_CAPSULE | ORAL | 3 refills | Status: DC
  Filled 2022-10-02: qty 60, 10d supply, fill #0
  Filled 2022-10-15: qty 60, 10d supply, fill #1

## 2022-10-02 MED ORDER — ALBUTEROL SULFATE HFA 108 (90 BASE) MCG/ACT IN AERS
INHALATION_SPRAY | RESPIRATORY_TRACT | 6 refills | Status: DC
  Filled 2022-10-02: qty 6.7, 17d supply, fill #0

## 2022-10-04 ENCOUNTER — Other Ambulatory Visit (HOSPITAL_COMMUNITY): Payer: Self-pay

## 2022-10-04 MED ORDER — AZITHROMYCIN 250 MG PO TABS
ORAL_TABLET | ORAL | 0 refills | Status: AC
  Filled 2022-10-04: qty 6, 5d supply, fill #0

## 2022-10-08 DIAGNOSIS — G4733 Obstructive sleep apnea (adult) (pediatric): Secondary | ICD-10-CM | POA: Diagnosis not present

## 2022-10-09 ENCOUNTER — Other Ambulatory Visit (HOSPITAL_COMMUNITY): Payer: Self-pay

## 2022-10-09 MED ORDER — DILTIAZEM HCL ER COATED BEADS 240 MG PO CP24
240.0000 mg | ORAL_CAPSULE | Freq: Every day | ORAL | 3 refills | Status: AC
  Filled 2022-10-09: qty 90, 90d supply, fill #0
  Filled 2023-01-08 – 2023-01-09 (×2): qty 90, 90d supply, fill #1
  Filled 2023-04-12: qty 90, 90d supply, fill #2

## 2022-10-11 ENCOUNTER — Other Ambulatory Visit (HOSPITAL_COMMUNITY): Payer: Self-pay

## 2022-10-16 ENCOUNTER — Other Ambulatory Visit (HOSPITAL_COMMUNITY): Payer: Self-pay

## 2022-10-23 ENCOUNTER — Other Ambulatory Visit: Payer: Self-pay | Admitting: Family Medicine

## 2022-10-23 DIAGNOSIS — Z1231 Encounter for screening mammogram for malignant neoplasm of breast: Secondary | ICD-10-CM

## 2022-10-26 ENCOUNTER — Ambulatory Visit (HOSPITAL_BASED_OUTPATIENT_CLINIC_OR_DEPARTMENT_OTHER): Payer: 59 | Admitting: Cardiovascular Disease

## 2022-10-27 ENCOUNTER — Other Ambulatory Visit (HOSPITAL_COMMUNITY): Payer: Self-pay

## 2022-11-06 ENCOUNTER — Ambulatory Visit (HOSPITAL_COMMUNITY)
Admission: RE | Admit: 2022-11-06 | Discharge: 2022-11-06 | Disposition: A | Discharge status: Home / Self Care | Payer: 59 | Source: Ambulatory Visit | Attending: Family Medicine | Admitting: Family Medicine

## 2022-11-06 DIAGNOSIS — E785 Hyperlipidemia, unspecified: Secondary | ICD-10-CM

## 2022-11-07 DIAGNOSIS — G4733 Obstructive sleep apnea (adult) (pediatric): Secondary | ICD-10-CM | POA: Diagnosis not present

## 2022-11-08 ENCOUNTER — Telehealth: Payer: Self-pay

## 2022-11-08 NOTE — Telephone Encounter (Signed)
Thanks for the update! Will let you know if f/u with Dr. Gwenlyn Found needed.   Loel Dubonnet, NP

## 2022-11-08 NOTE — Telephone Encounter (Addendum)
Per Dr. Junius Roads (message sent to Dr. Gwenlyn Found): Hi Misty Mahoney, [name]'s BP has been running very high despite being on 3 meds. 200/82 today in office.    Spoke with pt regarding message from Dr. Junius Roads reporting pt's blood pressure has been running higher recently. Based on pt's medical history Dr. Gwenlyn Found would like pt to be seen back in the HTN clinic and go from there. Appointment with Laurann Montana, NP made with pt. Explained to pt that if after this appointment she needs to come back to see Dr. Gwenlyn Found that we are happy to get her in. Pt verbalizes understanding.

## 2022-11-10 ENCOUNTER — Other Ambulatory Visit (HOSPITAL_COMMUNITY): Payer: Self-pay

## 2022-11-10 ENCOUNTER — Ambulatory Visit (INDEPENDENT_AMBULATORY_CARE_PROVIDER_SITE_OTHER): Payer: 59 | Admitting: Family

## 2022-11-10 ENCOUNTER — Encounter (HOSPITAL_BASED_OUTPATIENT_CLINIC_OR_DEPARTMENT_OTHER): Payer: Self-pay | Admitting: Family

## 2022-11-10 VITALS — BP 146/90 | HR 57 | Ht 68.5 in | Wt 254.0 lb

## 2022-11-10 DIAGNOSIS — I10 Essential (primary) hypertension: Secondary | ICD-10-CM

## 2022-11-10 DIAGNOSIS — I471 Supraventricular tachycardia, unspecified: Secondary | ICD-10-CM

## 2022-11-10 DIAGNOSIS — I773 Arterial fibromuscular dysplasia: Secondary | ICD-10-CM

## 2022-11-10 DIAGNOSIS — I480 Paroxysmal atrial fibrillation: Secondary | ICD-10-CM

## 2022-11-10 MED ORDER — HYDRALAZINE HCL 10 MG PO TABS
10.0000 mg | ORAL_TABLET | ORAL | 1 refills | Status: AC | PRN
  Filled 2022-11-10: qty 30, 30d supply, fill #0

## 2022-11-10 NOTE — Progress Notes (Signed)
Advanced Hypertension Clinic Assessment:    Date:  11/10/2022   ID:  Misty Mahoney, DOB 1966/09/28, MRN [id number]  PCP:  Eunice Blase, MD  Cardiologist:  Virl Axe, MD  Nephrologist:  Referring MD: Eunice Blase, MD   CC: Hypertension  History of Present Illness:    Misty Mahoney is a 56 y.o. female with a hx of anemia, hypertension, GERD, diabetes, HLD, obesity, SVT s/p RCFA here to follow up in the Advanced Hypertension Clinic.   Prior echo 2018 LVEF 60-65%, gr2DD. Hypertension dates back to birth of her son >20 years ago. She was referred to Advanced Hypertension Clinic after her BP remained difficult to control despite multiple agents. She has known adrenal adenoma but testing negative for hyperaldosterosim and pheochromocytoma. Spironolactone added and Doxazosin discontinued. Renal doppler >60% stenosis ont he right. Abdominal CT 10/2021 demonstrated fibromuscular dysplasia in right renal artery and left distal renal artery. Follows with Dr. Gwenlyn Found. Previously completed PREP exercise program at the [facility].  She was last seen 06/26/22 by Dr. Oval Linsey with BP well controlled on Carvedilol, Chlorthalidone, Diltiazem, Valsartan.   Presents today for follow up after seeing PCP with SBP 200. Shares with me that she took one of her husband's Metoprolol at home and SBP reduced to 170s. Notes with recent travel she has not been as active. She stopped taking her Chlorthalidone a few weeks ago as she was concerned regarding her GFR but has since resumed. Overall her average home BP is 140s/80s with heart rate in 50s. Notes she does sometimes miss her evening dose of Carvedilol. No chest pain, dyspnea, edema, orthopnea, PND.   Previous antihypertensives: Lisinopril - Severe Cough Irbesartan - Ineffective Spironolactone - cramps  Past Medical History:  Diagnosis Date   Anemia    gestational   Arterial fibromuscular dysplasia ([place]) 11/29/2021   Atherosclerosis 06/26/2022   Atypical  mole 07/24/2012   right abdomen moderate   Atypical mole 07/10/2018   right supra pubic moderate   Chicken pox as a child   Depression 06/15/2015   DM, gestational, diet controlled 04/10/2014   insulin resistant- A1C5.7    GERD (gastroesophageal reflux disease)    undx'd GERD    Hyperlipidemia 12/20/2015   Hypertension    Obesity, unspecified 04/10/2014   OSA (obstructive sleep apnea)    uses cpap   Osteoarthritis    Paroxysmal atrial fibrillation ([place])    Prediabetes    SVT (supraventricular tachycardia)    s/p RFCA 09/26/12   SVT (supraventricular tachycardia)    a. s/p slow pathway modification 2013 Dr Lovena Le   Vitamin D deficiency 12/26/2015    Past Surgical History:  Procedure Laterality Date   ADENOIDECTOMY     BREAST LUMPECTOMY WITH RADIOACTIVE SEED LOCALIZATION Right 08/03/2020   Procedure: RIGHT BREAST EXCISIONAL BIOPSY WITH RADIOACTIVE SEED LOCALIZATION;  Surgeon: Rolm Bookbinder, MD;  Location: [facility];  Service: General;  Laterality: Right;   ENDOVENOUS ABLATION SAPHENOUS VEIN W/ LASER Bilateral    Thermal ablation   KNEE ARTHROSCOPY WITH MENISCAL REPAIR Left 08/25/2016   Procedure: KNEE ARTHROSCOPY WITH partial medial meniscectomy, chondroplasty;  Surgeon: Melrose Nakayama, MD;  Location: [place];  Service: Orthopedics;  Laterality: Left;  KNEE ARTHROSCOPY WITH partial medial meniscectomy, chondroplasty   PALATE / UVULA BIOPSY / EXCISION     TONSILLECTOMY  56 yrs old   V-TACH ABLATION N/A 09/26/2012   Procedure: V-TACH ABLATION;  Surgeon: Evans Lance, MD;  Location[facility] C[facility] LAB;  Service:  Cardiovascular;  Laterality: N/A;   WISDOM TOOTH EXTRACTION  56 yrs old    Current Medications: Current Meds  Medication Sig   carvedilol (COREG) 3.125 MG tablet Take 1 tablet by mouth 2 times daily.   chlorthalidone (HYGROTON) 25 MG tablet Take 1 tablet by mouth once daily   Cholecalciferol (VITAMIN D) 2000 units CAPS Take 1 capsule by  mouth daily.   cyanocobalamin 1000 MCG tablet Take 1,000 mcg by mouth daily.   diltiazem (CARDIZEM CD) 240 MG 24 hr capsule Take 1 capsule (240 mg total) by mouth daily.   diltiazem (DILT-XR) 240 MG 24 hr capsule Take 1 capsule by mouth daily   escitalopram (LEXAPRO) 10 MG tablet Take 1 tablet by mouth daily.   flecainide (TAMBOCOR) 150 MG tablet Take 2 tablets (300 mg total) by mouth as needed.   hydrALAZINE (APRESOLINE) 10 MG tablet Take 1 tablet (10 mg total) by mouth as needed (as needed for systolic blood presure more than 180).   Magnesium 250 MG TABS Take 1 tablet by mouth daily.   pyridoxine (B-6) 100 MG tablet Take 100 mg by mouth daily.   triamcinolone cream (KENALOG) 0.1 % Apply to affected areas twice daily as needed   valsartan (DIOVAN) 320 MG tablet Take 1 tablet by mouth once daily     Allergies:   Epinephrine, Lisinopril, and Sunflower oil   Social History   Socioeconomic History   Marital status: Significant Other    Spouse name: [name]   Number of children: 2   Years of education: Not on file   Highest education level: Not on file  Occupational History   Occupation: RN  Tobacco Use   Smoking status: Never    Passive exposure: Never   Smokeless tobacco: Never  Vaping Use   Vaping Use: Never used  Substance and Sexual Activity   Alcohol use: Yes    Comment: two or three times a year   Drug use: No   Sexual activity: Yes    Partners: Male    Comment: lives with kids, significant other and step daughter. minimizing dairy and gluten, exercise  Other Topics Concern   Not on file  Social History Narrative   Not on file   Social Determinants of Health   Financial Resource Strain: Low Risk  (07/19/2021)   Overall Financial Resource Strain (CARDIA)    Difficulty of Paying Living Expenses: Not hard at all  Food Insecurity: No Food Insecurity (07/19/2021)   Hunger Vital Sign    Worried About Running Out of Food in the Last Year: Never true    [place]  in the Last Year: Never true  Transportation Needs: No Transportation Needs (07/19/2021)   PRAPARE - Hydrologist (Medical): No    Lack of Transportation (Non-Medical): No  Physical Activity: Insufficiently Active (07/19/2021)   Exercise Vital Sign    Days of Exercise per Week: 2 days    Minutes of Exercise per Session: 30 min  Stress: No Stress Concern Present (07/19/2021)   [place]    Feeling of Stress : Not at all  Social Connections: Not on file     Family History: The patient's family history includes ADD / ADHD in her daughter and son; Alzheimer's disease in her father; Atrial fibrillation in her father and mother; Breast cancer (age of onset: 76) in her sister; Cancer in her maternal grandfather and paternal grandmother; Cancer (  age of onset: 3) in her sister; Cataracts in her father; Colon cancer in her maternal grandmother; Colon polyps in her father; Dementia in her father and mother; Heart attack in her maternal grandfather; Hypertension in her brother, father, and mother; Stroke in her mother. There is no history of Esophageal cancer, Rectal cancer, or Stomach cancer.  ROS:   Please see the history of present illness.    All other systems reviewed and are negative.  EKGs/Labs/Other Studies Reviewed:    EKG:  EKG is  ordered today.  The ekg ordered today demonstrates SB 57 bpm with no acute ST/T wave changes.   Recent Labs: 12/06/2021: Creatinine, Ser 1.30   Recent Lipid Panel    Component Value Date/Time   CHOL 175 07/17/2019 0900   CHOL 177 08/22/2018 1015   TRIG 93.0 07/17/2019 0900   HDL 57.90 07/17/2019 0900   HDL 55 08/22/2018 1015   CHOLHDL 3 07/17/2019 0900   VLDL 18.6 07/17/2019 0900   LDLCALC 99 07/17/2019 0900   LDLCALC 100 (H) 08/22/2018 1015    Physical Exam:   VS:  BP (!) 146/90   Pulse (!) 57   Ht 5' 8.5" (1.74 m)   Wt 254 lb (115.2 kg)   LMP  06/24/2018   BMI 38.06 kg/m  , BMI Body mass index is 38.06 kg/m. GENERAL:  Well appearing, overweight HEENT: Pupils equal round and reactive, fundi not visualized, oral mucosa unremarkable NECK:  No jugular venous distention, waveform within normal limits, carotid upstroke brisk and symmetric, no bruits, no thyromegaly LYMPHATICS:  No cervical adenopathy LUNGS:  Clear to auscultation bilaterally HEART:  RRR.  PMI not displaced or sustained,S1 and S2 within normal limits, no S3, no S4, no clicks, no rubs, no murmurs ABD:  Flat, positive bowel sounds normal in frequency in pitch, no bruits, no rebound, no guarding, no midline pulsatile mass, no hepatomegaly, no splenomegaly EXT:  2 plus pulses throughout, no edema, no cyanosis no clubbing SKIN:  No rashes no nodules NEURO:  Cranial nerves II through XII grossly intact, motor grossly intact throughout PSYCH:  Cognitively intact, oriented to person place and time   ASSESSMENT/PLAN:    Arterial fibromuscular dysplasia / HTN - BP not at goal <130/80. Renal artery fibromuscular dysplasia followed by Dr. Gwenlyn Found. Notes occasional missed doses of evening Carvedilol. Marked elevated BP at PCP in setting of getting orthopedic injection. She is hesitant regarding medication changes and wishes to be more consistent with evening medications and monitor. Continue Coreg 3.'125mg'$  BID, Chlorthalidone '25mg'$  QD, Diltiazem '240mg'$  QD, Valsartan '320mg'$  QD. Will Rx Hydralazine '10mg'$  PRN for SBP >180. Follow up via MyChart in 1 week. If BP not at goal, plan to adjust antihypertensive regimen. Hesitant to increase Coreg due to baseline bradycardia.  Atrial fibrillation - s/p RCFA. Shares with me she has about 1 episode per year which is short lived. Managed by PRN Flecainide. Not on [place].   Atherosclerosis - Known mild atherosclerosis of cerebral vascular. Ideally LDL goal <70. At last visit she preferred ot work on diet and exercise. Update labs at next visit if not  collected recently by PCP.   SVT - Stable on diltiazem and carvedilol.  Screening for Secondary Hypertension:     07/19/2021    9:12 AM 08/18/2021    4:24 PM  Causes  Drugs/Herbals Screened      - Comments NSAIDS twice per week   Renovascular HTN Screened Screened     - Comments Check renal artery Dopplers  right RAS, has appt with Dr. Gwenlyn Found in October  Sleep Apnea Screened      - Comments on CPAP   Thyroid Disease Screened      - Comments repeat TSH   Hyperaldosteronism Screened Screened     - Comments check renal artery Dopplers. WNL  Pheochromocytoma Screened      - Comments Negative in 2019.  No symtpoms.   Cushing's Syndrome Screened      - Comments cortisol normal     Relevant Labs/Studies:    Latest Ref Rng & Units 12/06/2021   11:13 AM 08/12/2021    4:23 PM 07/30/2020    2:55 PM  Basic Labs  Sodium 134 - 144 mmol/L  143  140   Potassium 3.5 - 5.2 mmol/L  4.1  3.4   Creatinine 0.44 - 1.00 mg/dL 1.30  0.97  0.91        Latest Ref Rng & Units 07/17/2019    9:00 AM 03/05/2018    9:01 AM  Thyroid   TSH 0.35 - 4.50 uIU/mL 1.39  1.900        Latest Ref Rng & Units 08/05/2021   10:37 AM 02/14/2018    1:57 PM  Renin/Aldosterone   Aldosterone 0.0 - 30.0 ng/dL 4.5  6   Renin 0.167 - 5.380 ng/mL/hr 0.312    Aldos/Renin Ratio 0.0 - 30.0 14.4            Latest Ref Rng & Units 12/13/2017   10:29 AM  Cortisol  Cortisol  ug/dL 4.2        08/05/2021   10:13 AM  Renovascular   Renal Artery [place] Completed Yes        Disposition:    FU with Dr. Gwenlyn Found 12/2022 and Dr. Oval Linsey 02/2023   Medication Adjustments/Labs and Tests Ordered: Current medicines are reviewed at length with the patient today.  Concerns regarding medicines are outlined above.  Orders Placed This Encounter  Procedures   EKG 12-Lead   Meds ordered this encounter  Medications   hydrALAZINE (APRESOLINE) 10 MG tablet    Sig: Take 1 tablet (10 mg total) by mouth as needed (as needed for systolic blood  presure more than 180).    Dispense:  30 tablet    Refill:  1    Order Specific Question:   Supervising Provider    Answer:   Buford Dresser [[id number]]     Signed, Loel Dubonnet, NP  11/10/2022 9:26 PM    [facility]

## 2022-11-10 NOTE — Patient Instructions (Signed)
Medication Instructions:  Your physician has recommended you make the following change in your medication:   START Hydralazine one '10mg'$  tablet as needed for systolic blood pressure consistently more than 180  *If you need a refill on your cardiac medications before your next appointment, please call your pharmacy*  Lab Work/Testing/Procedures: Your EKG today showed sinus bradycardia which is a stable finding.   Follow-Up: At [facility], you and your health needs are our priority.  As part of our continuing mission to provide you with exceptional heart care, we have created designated Provider Care Teams.  These Care Teams include your primary Cardiologist (physician) and Advanced Practice Providers (APPs -  Physician Assistants and Nurse Practitioners) who all work together to provide you with the care you need, when you need it.  We recommend signing up for the patient portal called "MyChart".  Sign up information is provided on this After Visit Summary.  MyChart is used to connect with patients for Virtual Visits (Telemedicine).  Patients are able to view lab/test results, encounter notes, upcoming appointments, etc.  Non-urgent messages can be sent to your provider as well.   To learn more about what you can do with MyChart, go to [email or web address].    Your next appointment:   As scheduled with Dr. Oval Linsey AND In February 2024 with with Dr. Gwenlyn Found  Other Instructions  Tips to Measure your Blood Pressure Correctly  Here's what you can do to ensure a correct reading:  Don't drink a caffeinated beverage or smoke during the 30 minutes before the test.  Sit quietly for five minutes before the test begins.  During the measurement, sit in a chair with your feet on the floor and your arm supported so your elbow is at about heart level.  The inflatable part of the cuff should completely cover at least 80% of your upper arm, and the cuff should be placed on bare skin, not over  a shirt.  Don't talk during the measurement.   Blood pressure categories  Blood pressure category SYSTOLIC (upper number)  DIASTOLIC (lower number)  Normal Less than 120 mm Hg and Less than 80 mm Hg  Elevated 120-129 mm Hg and Less than 80 mm Hg  High blood pressure: Stage 1 hypertension 130-139 mm Hg or 80-89 mm Hg  High blood pressure: Stage 2 hypertension 140 mm Hg or higher or 90 mm Hg or higher  Hypertensive crisis (consult your doctor immediately) Higher than 180 mm Hg and/or Higher than 120 mm Hg  Source: American Heart Association and American Stroke Association. For more on getting your blood pressure under control, buy Controlling Your Blood Pressure, a Special Health Report from [facility].   Blood Pressure Log   Date   Time  Blood Pressure  Example: Nov 1 9 AM 124/78

## 2022-11-16 ENCOUNTER — Encounter (HOSPITAL_BASED_OUTPATIENT_CLINIC_OR_DEPARTMENT_OTHER): Payer: Self-pay

## 2022-11-29 ENCOUNTER — Encounter: Payer: Self-pay | Admitting: Cardiovascular Disease

## 2022-11-29 ENCOUNTER — Other Ambulatory Visit: Payer: Self-pay | Admitting: *Deleted

## 2022-11-29 ENCOUNTER — Ambulatory Visit: Payer: Commercial Managed Care - PPO | Attending: Cardiovascular Disease | Admitting: Cardiovascular Disease

## 2022-11-29 VITALS — BP 140/72 | HR 63 | Ht 68.5 in | Wt 252.0 lb

## 2022-11-29 DIAGNOSIS — I773 Arterial fibromuscular dysplasia: Secondary | ICD-10-CM

## 2022-11-29 MED ORDER — SODIUM CHLORIDE 0.9% FLUSH: 3.0000 mL | Freq: Two times a day (BID) | INTRAVENOUS | Status: DC

## 2022-11-29 NOTE — Assessment & Plan Note (Signed)
Ms. Lahm has resistant hypertension on multiple antihypertensive medications.  She does have a history of FMD in bilateral renal arteries as well as a left renal artery aneurysm.  We decided to proceed with outpatient renal angiography potential PTA for right renal artery FMD.

## 2022-11-29 NOTE — Progress Notes (Signed)
11/29/2022 Misty Mahoney   25-Jul-1966  [id number]  Primary Physician Hilts, Legrand Como, MD Primary Cardiologist: Lorretta Harp MD FACP, [place], [place], [place]  HPI:  Misty Mahoney is a 57 y.o.   moderately overweight single Caucasian female who has been living with her partner for 10 years.  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  She was referred by Dr. Oval Linsey, her cardiologist, for resistant hypertension.  I last saw her in the office 12/28/2021.  She does have a history of treated hypertension hyperlipidemia.  There is no family history of heart disease.  She is never had a heart attack or stroke.  She does complain of some dyspnea on exertion.  She has obstructive sleep apnea on CPAP.  She had renal Doppler studies performed 08/05/2021 that suggested the possibility of right renal artery fibromuscular dysplasia.  She has had hyper Aldo ruled out as well.     I got a abdominal CTA on 11/03/2021 that confirmed bilateral renal FMD.  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  She is involved in aggressive lifestyle modification.  Blood pressures have been under much better control.  We talked about renal intervention versus continued medical therapy and lifestyle modification which she preferred to do when I saw her a year ago.  Since I saw her a year ago she is still on multiple antihypertensive medications.  When she stopped her diuretic briefly her blood pressure spiked.  She wishes to proceed with outpatient renal angiography potential PTA for renal FMD/renovascular hypertension.   Current Meds  Medication Sig   carvedilol (COREG) 3.125 MG tablet Take 1 tablet by mouth 2 times daily.   chlorthalidone (HYGROTON) 25 MG tablet Take 1 tablet by mouth once daily   Cholecalciferol (VITAMIN D) 2000 units CAPS Take 1 capsule by mouth daily.    cyanocobalamin 1000 MCG tablet Take 1,000 mcg by mouth daily.   diltiazem (CARDIZEM CD) 240 MG 24 hr capsule Take 1 capsule (240 mg total) by mouth daily.   diltiazem (DILT-XR) 240 MG 24 hr capsule Take 1 capsule by mouth daily   escitalopram (LEXAPRO) 10 MG tablet Take 1 tablet by mouth daily.   flecainide (TAMBOCOR) 150 MG tablet Take 2 tablets (300 mg total) by mouth as needed.   hydrALAZINE (APRESOLINE) 10 MG tablet Take 1 tablet (10 mg total) by mouth as needed (as needed for systolic blood presure more than 180).   Magnesium 250 MG TABS Take 1 tablet by mouth daily.   pyridoxine (B-6) 100 MG tablet Take 100 mg by mouth daily.   triamcinolone cream (KENALOG) 0.1 % Apply to affected areas twice daily as needed   valsartan (DIOVAN) 320 MG tablet Take 1 tablet by mouth once daily     Allergies  Allergen Reactions   Epinephrine     Hx of a fib   Lisinopril Cough   Sunflower Oil Swelling    Seeds     Social History   Socioeconomic History   Marital status: Significant Other    Spouse name: [name]   Number of children: 2   Years of education: Not on file   Highest education level: Not on file  Occupational History   Occupation: RN  Tobacco Use   Smoking status: Never    Passive exposure: Never  Smokeless tobacco: Never  Vaping Use   Vaping Use: Never used  Substance and Sexual Activity   Alcohol use: Yes    Comment: two or three times a year   Drug use: No   Sexual activity: Yes    Partners: Male    Comment: lives with kids, significant other and step daughter. minimizing dairy and gluten, exercise  Other Topics Concern   Not on file  Social History Narrative   Not on file   Social Determinants of Health   Financial Resource Strain: Low Risk  (07/19/2021)   Overall Financial Resource Strain (CARDIA)    Difficulty of Paying Living Expenses: Not hard at all  Food Insecurity: No Food Insecurity (07/19/2021)   Hunger Vital Sign    Worried About Running Out of Food  in the Last Year: Never true    [place] in the Last Year: Never true  Transportation Needs: No Transportation Needs (07/19/2021)   PRAPARE - Hydrologist (Medical): No    Lack of Transportation (Non-Medical): No  Physical Activity: Insufficiently Active (07/19/2021)   Exercise Vital Sign    Days of Exercise per Week: 2 days    Minutes of Exercise per Session: 30 min  Stress: No Stress Concern Present (07/19/2021)   [place]    Feeling of Stress : Not at all  Social Connections: Not on file  Intimate Partner Violence: Not on file     Review of Systems: General: negative for chills, fever, night sweats or weight changes.  Cardiovascular: negative for chest pain, dyspnea on exertion, edema, orthopnea, palpitations, paroxysmal nocturnal dyspnea or shortness of breath Dermatological: negative for rash Respiratory: negative for cough or wheezing Urologic: negative for hematuria Abdominal: negative for nausea, vomiting, diarrhea, bright red blood per rectum, melena, or hematemesis Neurologic: negative for visual changes, syncope, or dizziness All other systems reviewed and are otherwise negative except as noted above.    Blood pressure (!) 140/72, pulse 63, height 5' 8.5" (1.74 m), weight 252 lb (114.3 kg), last menstrual period 06/24/2018.  General appearance: alert and no distress Neck: no adenopathy, no carotid bruit, no JVD, supple, symmetrical, trachea midline, and thyroid not enlarged, symmetric, no tenderness/mass/nodules Lungs: clear to auscultation bilaterally Heart: regular rate and rhythm, S1, S2 normal, no murmur, click, rub or gallop Extremities: extremities normal, atraumatic, no cyanosis or edema Pulses: 2+ and symmetric Skin: Skin color, texture, turgor normal. No rashes or lesions Neurologic: Grossly normal  EKG not performed today  ASSESSMENT AND PLAN:   Arterial  fibromuscular dysplasia (HCC) Ms. Badilla has resistant hypertension on multiple antihypertensive medications.  She does have a history of FMD in bilateral renal arteries as well as a left renal artery aneurysm.  We decided to proceed with outpatient renal angiography potential PTA for right renal artery FMD.     Lorretta Harp MD [facility], [facility] 11/29/2022 4:05 PM

## 2022-11-29 NOTE — Patient Instructions (Signed)
  Testing/Procedures     Peripheral Catheterization   You are scheduled for a Peripheral Angiogram on Thursday, January 25 with Dr. Quay Burow.  1. Please arrive at the Main Entrance A at [facility]: [place], [place] [phone] on January 25 at 7:30 AM (This time is two hours before your procedure to ensure your preparation). Free valet parking service is available. You will check in at ADMITTING. The support person will be asked to wait in the waiting room.  It is OK to have someone drop you off and come back when you are ready to be discharged.        Special note: Every effort is made to have your procedure done on time. Please understand that emergencies sometimes delay scheduled procedures.   . 2. Diet: Do not eat solid foods after midnight.  You may have clear liquids until 5 AM the day of the procedure.  3. Labs: You will need to have blood drawn on TODAY  4. Medication instructions in preparation for your procedure:  DO NOT TAKE CHLORTHALIDONE THE MORNING OF THE PROCEDURE  On the morning of your procedure, take any morning medicines NOT listed above.  You may use sips of water.  5. Plan to go home the same day, you will only stay overnight if medically necessary. 6. You MUST have a responsible adult to drive you home. 7. An adult MUST be with you the first 24 hours after you arrive home. 8. Bring a current list of your medications, and the last time and date medication taken. 9. Bring ID and current insurance cards. 10.Please wear clothes that are easy to get on and off and wear slip-on shoes.  Thank you for allowing [place] to care for you!   -- [place] [facility]   Your physician has requested that you have a renal artery duplex. During this test, an ultrasound is used to evaluate blood flow to the kidneys. Allow one hour for this exam. Do not eat after midnight the day before and avoid carbonated beverages. Take your  medications as you usually do. 1 WEEK AFTER PROCEDURE   Follow-Up: At [facility], you and your health needs are our priority.  As part of our continuing mission to provide you with exceptional heart care, we have created designated Provider Care Teams.  These Care Teams include your primary Cardiologist (physician) and Advanced Practice Providers (APPs -  Physician Assistants and Nurse Practitioners) who all work together to provide you with the care you need, when you need it.  We recommend signing up for the patient portal called "MyChart".  Sign up information is provided on this After Visit Summary.  MyChart is used to connect with patients for Virtual Visits (Telemedicine).  Patients are able to view lab/test results, encounter notes, upcoming appointments, etc.  Non-urgent messages can be sent to your provider as well.   To learn more about what you can do with MyChart, go to [email or web address].    Your next appointment:   2 week(s) AFTER PROCEDURE  Provider:   Quay Burow MD

## 2022-11-30 ENCOUNTER — Other Ambulatory Visit: Payer: Self-pay | Admitting: General Surgery

## 2022-11-30 DIAGNOSIS — Z1231 Encounter for screening mammogram for malignant neoplasm of breast: Secondary | ICD-10-CM

## 2022-11-30 LAB — BASIC METABOLIC PANEL
BUN/Creatinine Ratio: 22 (ref 9–23)
BUN: 22 mg/dL (ref 6–24)
CO2: 24 mmol/L (ref 20–29)
Calcium: 9.8 mg/dL (ref 8.7–10.2)
Chloride: 102 mmol/L (ref 96–106)
Creatinine, Ser: 1.02 mg/dL — ABNORMAL HIGH (ref 0.57–1.00)
Glucose: 85 mg/dL (ref 70–99)
Potassium: 4.1 mmol/L (ref 3.5–5.2)
Sodium: 141 mmol/L (ref 134–144)
eGFR: 64 mL/min/{1.73_m2} (ref >59)

## 2022-11-30 LAB — CBC
Hematocrit: 33.7 % — ABNORMAL LOW (ref 34.0–46.6)
Hemoglobin: 11.1 g/dL (ref 11.1–15.9)
MCH: 28.4 pg (ref 26.6–33.0)
MCHC: 32.9 g/dL (ref 31.5–35.7)
MCV: 86 fL (ref 79–97)
Platelets: 302 10*3/uL (ref 150–450)
RBC: 3.91 x10E6/uL (ref 3.77–5.28)
RDW: 13.2 % (ref 11.7–15.4)
WBC: 9.5 10*3/uL (ref 3.4–10.8)

## 2022-12-06 ENCOUNTER — Other Ambulatory Visit: Payer: Self-pay | Admitting: General Surgery

## 2022-12-06 DIAGNOSIS — Z1231 Encounter for screening mammogram for malignant neoplasm of breast: Secondary | ICD-10-CM

## 2022-12-12 ENCOUNTER — Telehealth: Payer: Self-pay | Admitting: Cardiovascular Disease

## 2022-12-12 NOTE — Telephone Encounter (Signed)
Patient called to talk with Dr. Gwenlyn Found or nurse about procedure on Thursday. Please call back

## 2022-12-12 NOTE — Telephone Encounter (Signed)
Spoke with pt regarding recent positive covid test. Per Dr. Gwenlyn Found, we will reschedule pt for renal angiography. Pt states that should would specifically like to have procedure done on Thursday, Feb. 22nd. Pt was last seen in the office on 11/29/22. Explained to pt that office visit and labs would have to be redone for procedure to be on this date. Pt is already scheduled for follow up with Dr. Gwenlyn Found on 2/14. Will keep this office visit and re-schedule renal angiogram at this time. Post procedure renal dopplers cancelled. Pt verbalizes understanding.

## 2022-12-12 NOTE — Telephone Encounter (Signed)
Patient stated she started having chills on Sunday night and tested positive for COVID with home test last night. She is to have renal angiography on Thursday 1/25. Please advise.

## 2022-12-14 ENCOUNTER — Ambulatory Visit (HOSPITAL_COMMUNITY)
Admission: RE | Admit: 2022-12-14 | Payer: Commercial Managed Care - PPO | Source: Ambulatory Visit | Admitting: Cardiovascular Disease

## 2022-12-14 ENCOUNTER — Encounter (HOSPITAL_COMMUNITY): Admission: RE | Payer: Self-pay | Source: Ambulatory Visit

## 2022-12-14 ENCOUNTER — Telehealth: Payer: Commercial Managed Care - PPO | Admitting: Primary Care

## 2022-12-14 DIAGNOSIS — Z1231 Encounter for screening mammogram for malignant neoplasm of breast: Secondary | ICD-10-CM

## 2022-12-14 SURGERY — RENAL ANGIOGRAPHY
Anesthesia: LOCAL

## 2022-12-15 ENCOUNTER — Telehealth (INDEPENDENT_AMBULATORY_CARE_PROVIDER_SITE_OTHER): Payer: Commercial Managed Care - PPO | Admitting: Primary Care

## 2022-12-15 DIAGNOSIS — G4730 Sleep apnea, unspecified: Secondary | ICD-10-CM

## 2022-12-15 NOTE — Progress Notes (Signed)
Virtual Visit via Video Note  I connected with Misty Mahoney on 12/15/22 at  2:30 PM EST by a video enabled telemedicine application and verified that I am speaking with the correct person using two identifiers.  Location: Patient: Home Provider: Office    I discussed the limitations of evaluation and management by telemedicine and the availability of in person appointments. The patient expressed understanding and agreed to proceed.  History of Present Illness: 57 year old female, never smoked.  Past medical history significant for A-fib, hypertension, arthrosclerosis, OSA.  Patient of Dr. Halford Chessman, last seen in 2019.  Previous [facility] pulmonary encounter 08/02/2022 Patient presents today for overdue follow-up for OSA.  She continues to consistently wear her CPAP every night. She is due for new CPAP machine. She uses a full face mask, however, has been experiencing irritation to bride of her nose. She thinks she may be tightening her mask too tight. She is interested in trying a memory foam full face mask if insurance will cover. Otherwise she will look into getting liners for her CPAP mask. She changes her mask every 2-3 months. No significant daytime sleepiness. She will get droawsy at times reading or watching TV. No problem at work or driving.   Airview download 05/04/22- 08/01/22 Usage 90/90 days Average usage 7 hour 76mns  Leaks 95th - 10.6L/min  AHI 2.75   Severe OSA; HST 07/2012 >> AHI 43/hour. Patient is 100% compliant with CPAP use last 90 days. She reports benefit from use. No issues with pressure setting. She is experiencing some irritation to the bridge of her nose from her CPAP mask. We will place an order for her to be provided new CPAP machine auto setting 5-15cm h20 and memory foam full face mask  12/15/2022- Interim hx  Patient contacted today for OSA follow-up.  Since her last office visit she received a new CPAP machine, she is 100% compliant with use over the last 30 days.   Current pressure setting 5 to 15 cm H2O. She is tolerating CPAP without issues. Sleeping well. No residual daytime sleepiness.    Observations/Objective:  Appears well; No respiratory symptoms   Assessment and Plan:  Severe OSA - Home sleep study 07/2012 >> AHI 43/hour - Received new CPAP fall/winter 2023 - She is 100% compliant eith CPAP use last 30 days > 4 hours - Pressure 5-15cm h20; Residual AHI 3.7 - Discussed alternative to CPAP such as inspire, not currently candidate d/t BMI  - Continue to encourage patient wear CPAP every night min 4-6 hours or longer. Encourage weight loss efforts.   Follow Up Instructions:   - FU in 1 year or sooner if needed  I discussed the assessment and treatment plan with the patient. The patient was provided an opportunity to ask questions and all were answered. The patient agreed with the plan and demonstrated an understanding of the instructions.   The patient was advised to call back or seek an in-person evaluation if the symptoms worsen or if the condition fails to improve as anticipated.  I provided 22 minutes of non-face-to-face time during this encounter.   EMartyn Ehrich NP

## 2022-12-18 ENCOUNTER — Encounter: Payer: Self-pay | Admitting: Cardiovascular Disease

## 2022-12-21 ENCOUNTER — Other Ambulatory Visit: Payer: Self-pay

## 2022-12-21 ENCOUNTER — Ambulatory Visit
Admission: RE | Admit: 2022-12-21 | Discharge: 2022-12-21 | Disposition: A | Discharge status: Home / Self Care | Payer: Commercial Managed Care - PPO | Source: Ambulatory Visit

## 2022-12-21 ENCOUNTER — Encounter (HOSPITAL_COMMUNITY): Payer: Commercial Managed Care - PPO

## 2022-12-21 DIAGNOSIS — Z1231 Encounter for screening mammogram for malignant neoplasm of breast: Secondary | ICD-10-CM

## 2023-01-03 ENCOUNTER — Encounter: Payer: Self-pay | Admitting: Cardiovascular Disease

## 2023-01-03 ENCOUNTER — Ambulatory Visit: Payer: Commercial Managed Care - PPO | Attending: Cardiovascular Disease | Admitting: Cardiovascular Disease

## 2023-01-03 VITALS — BP 164/72 | HR 51 | Ht 68.5 in | Wt 253.0 lb

## 2023-01-03 DIAGNOSIS — I773 Arterial fibromuscular dysplasia: Secondary | ICD-10-CM

## 2023-01-03 MED ORDER — ASPIRIN 81 MG PO TBEC: 81.0000 mg | DELAYED_RELEASE_TABLET | Freq: Every day | ORAL | 3 refills | Status: AC

## 2023-01-03 NOTE — Progress Notes (Signed)
01/03/2023 Misty Mahoney   09/08/1966  [id number]  Primary Physician Hilts, Legrand Como, MD Primary Cardiologist: Misty Harp MD FACP, Misty Mahoney  HPI:  Misty Mahoney is a 57 y.o.   moderately overweight single Caucasian female who has been living with her partner for 10 years.  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  She was referred by Dr. Oval Mahoney, her cardiologist, for resistant hypertension.  I last saw her in the office 11/29/2022.  She does have a history of treated hypertension hyperlipidemia.  There is no family history of heart disease.  She is never had a heart attack or stroke.  She does complain of some dyspnea on exertion.  She has obstructive sleep apnea on CPAP.  She had renal Doppler studies performed 08/05/2021 that suggested the possibility of right renal artery fibromuscular dysplasia.  She has had hyper Aldo ruled out as well.     I got a abdominal CTA on 11/03/2021 that confirmed bilateral renal FMD.  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  She is involved in aggressive lifestyle modification.  Blood pressures have been under much better control.  We talked about renal intervention versus continued medical therapy and lifestyle modification which she preferred to do when I saw her a year ago.   Since I saw her in the office a month ago I did schedule her for renal angiogram and potential PTA of renal artery FMD/renovascular hypertension.  Unfortunately, she tested positive for COVID prior to her procedure which was canceled and she is here for rescheduling.  Her blood pressures have been running in the 150 range at home on multiple antihypertensive medications.   Current Meds  Medication Sig   carvedilol (COREG) 3.125 MG tablet Take 1 tablet by mouth 2 times daily.   chlorthalidone (HYGROTON) 25 MG tablet  Take 1 tablet by mouth once daily   Cholecalciferol (VITAMIN D) 2000 units CAPS Take 1 capsule by mouth daily.   cyanocobalamin 1000 MCG tablet Take 1,000 mcg by mouth daily.   diltiazem (CARDIZEM CD) 240 MG 24 hr capsule Take 1 capsule (240 mg total) by mouth daily.   escitalopram (LEXAPRO) 10 MG tablet Take 1 tablet by mouth daily.   flecainide (TAMBOCOR) 150 MG tablet Take 2 tablets (300 mg total) by mouth as needed.   hydrALAZINE (APRESOLINE) 10 MG tablet Take 1 tablet (10 mg total) by mouth as needed (as needed for systolic blood presure more than 180).   Magnesium 250 MG TABS Take 1 tablet by mouth daily.   pyridoxine (B-6) 100 MG tablet Take 100 mg by mouth daily.   valsartan (DIOVAN) 320 MG tablet Take 1 tablet by mouth once daily   Current Facility-Administered Medications for the 01/03/23 encounter (Office Visit) with Misty Harp, MD  Medication   sodium chloride flush (NS) 0.9 % injection 3 mL     Allergies  Allergen Reactions   Epinephrine     Hx of a fib   Lisinopril Cough   Sunflower Oil Swelling    Seeds     Social History   Socioeconomic History   Marital status: Significant Other    Spouse name: Misty Mahoney   Number of children: 2   Years of education: Not on file   Highest education level: Not on file  Occupational History   Occupation: Therapist, sports  Tobacco Use   Smoking status: Never    Passive exposure: Never   Smokeless tobacco: Never  Vaping Use   Vaping Use: Never used  Substance and Sexual Activity   Alcohol use: Yes    Comment: two or three times a year   Drug use: No   Sexual activity: Yes    Partners: Male    Comment: lives with kids, significant other and step daughter. minimizing dairy and gluten, exercise  Other Topics Concern   Not on file  Social History Narrative   Not on file   Social Determinants of Health   Financial Resource Strain: Low Risk  (07/19/2021)   Overall Financial Resource Strain (CARDIA)    Difficulty of Paying Living  Expenses: Not hard at all  Food Insecurity: No Food Insecurity (07/19/2021)   Hunger Vital Sign    Worried About Running Out of Food in the Last Year: Never true    [place] in the Last Year: Never true  Transportation Needs: No Transportation Needs (07/19/2021)   PRAPARE - Hydrologist (Medical): No    Lack of Transportation (Non-Medical): No  Physical Activity: Insufficiently Active (07/19/2021)   Exercise Vital Sign    Days of Exercise per Week: 2 days    Minutes of Exercise per Session: 30 min  Stress: No Stress Concern Present (07/19/2021)   [place]    Feeling of Stress : Not at all  Social Connections: Not on file  Intimate Partner Violence: Not on file     Review of Systems: General: negative for chills, fever, night sweats or weight changes.  Cardiovascular: negative for chest pain, dyspnea on exertion, edema, orthopnea, palpitations, paroxysmal nocturnal dyspnea or shortness of breath Dermatological: negative for rash Respiratory: negative for cough or wheezing Urologic: negative for hematuria Abdominal: negative for nausea, vomiting, diarrhea, bright red blood per rectum, melena, or hematemesis Neurologic: negative for visual changes, syncope, or dizziness All other systems reviewed and are otherwise negative except as noted above.    Blood pressure (!) 164/72, pulse (!) 51, height 5' 8.5" (1.74 m), weight 253 lb (114.8 kg), last menstrual period 06/24/2018, SpO2 96 %.  General appearance: alert and no distress Neck: no adenopathy, no carotid bruit, no JVD, supple, symmetrical, trachea midline, and thyroid not enlarged, symmetric, no tenderness/mass/nodules Lungs: clear to auscultation bilaterally Heart: regular rate and rhythm, S1, S2 normal, no murmur, click, rub or gallop Extremities: extremities normal, atraumatic, no cyanosis or edema Pulses: 2+ and symmetric Skin:  Skin color, texture, turgor normal. No rashes or lesions Neurologic: Grossly normal  EKG not performed today  ASSESSMENT AND PLAN:   Arterial fibromuscular dysplasia (HCC) Misty Mahoney returns for scheduling of her renal angiogram for FMD and potential renal vascular hypertension.  She was scheduled last month but unfortunately developed COVID prior to her procedure date which was canceled.  We will reschedule her sometime the next several weeks.     Misty Harp MD FACP,FACC,FAHA, [facility] 01/03/2023 11:40 AM

## 2023-01-03 NOTE — H&P (View-Only) (Signed)
01/03/2023 Misty Mahoney   1966-05-25  [id number]  Primary Physician Hilts, Legrand Como, MD Primary Cardiologist: Misty Harp MD FACP, Misty Mahoney, [place], [place]  HPI:  Misty Mahoney is a 57 y.o.   moderately overweight single Caucasian female who has been living with her partner for 10 years.  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  She was referred by Dr. Oval Mahoney, her cardiologist, for resistant hypertension.  I last saw her in the office 11/29/2022.  She does have a history of treated hypertension hyperlipidemia.  There is no family history of heart disease.  She is never had a heart attack or stroke.  She does complain of some dyspnea on exertion.  She has obstructive sleep apnea on CPAP.  She had renal Doppler studies performed 08/05/2021 that suggested the possibility of right renal artery fibromuscular dysplasia.  She has had hyper Aldo ruled out as well.     I got a abdominal CTA on 11/03/2021 that confirmed bilateral renal FMD.  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  She is involved in aggressive lifestyle modification.  Blood pressures have been under much better control.  We talked about renal intervention versus continued medical therapy and lifestyle modification which she preferred to do when I saw her a year ago.   Since I saw her in the office a month ago I did schedule her for renal angiogram and potential PTA of renal artery FMD/renovascular hypertension.  Unfortunately, she tested positive for COVID prior to her procedure which was canceled and she is here for rescheduling.  Her blood pressures have been running in the 150 range at home on multiple antihypertensive medications.   Current Meds  Medication Sig   carvedilol (COREG) 3.125 MG tablet Take 1 tablet by mouth 2 times daily.   chlorthalidone (HYGROTON) 25 MG tablet  Take 1 tablet by mouth once daily   Cholecalciferol (VITAMIN D) 2000 units CAPS Take 1 capsule by mouth daily.   cyanocobalamin 1000 MCG tablet Take 1,000 mcg by mouth daily.   diltiazem (CARDIZEM CD) 240 MG 24 hr capsule Take 1 capsule (240 mg total) by mouth daily.   escitalopram (LEXAPRO) 10 MG tablet Take 1 tablet by mouth daily.   flecainide (TAMBOCOR) 150 MG tablet Take 2 tablets (300 mg total) by mouth as needed.   hydrALAZINE (APRESOLINE) 10 MG tablet Take 1 tablet (10 mg total) by mouth as needed (as needed for systolic blood presure more than 180).   Magnesium 250 MG TABS Take 1 tablet by mouth daily.   pyridoxine (B-6) 100 MG tablet Take 100 mg by mouth daily.   valsartan (DIOVAN) 320 MG tablet Take 1 tablet by mouth once daily   Current Facility-Administered Medications for the 01/03/23 encounter (Office Visit) with Misty Harp, MD  Medication   sodium chloride flush (NS) 0.9 % injection 3 mL     Allergies  Allergen Reactions   Epinephrine     Hx of a fib   Lisinopril Cough   Sunflower Oil Swelling    Seeds     Social History   Socioeconomic History   Marital status: Significant Other    Spouse name: Misty Mahoney   Number of children: 2   Years of education: Not on file   Highest education level: Not on file  Occupational History   Occupation: Therapist, sports  Tobacco Use   Smoking status: Never    Passive exposure: Never   Smokeless tobacco: Never  Vaping Use   Vaping Use: Never used  Substance and Sexual Activity   Alcohol use: Yes    Comment: two or three times a year   Drug use: No   Sexual activity: Yes    Partners: Male    Comment: lives with kids, significant other and step daughter. minimizing dairy and gluten, exercise  Other Topics Concern   Not on file  Social History Narrative   Not on file   Social Determinants of Health   Financial Resource Strain: Low Risk  (07/19/2021)   Overall Financial Resource Strain (CARDIA)    Difficulty of Paying Living  Expenses: Not hard at all  Food Insecurity: No Food Insecurity (07/19/2021)   Hunger Vital Sign    Worried About Running Out of Food in the Last Year: Never true    [place] in the Last Year: Never true  Transportation Needs: No Transportation Needs (07/19/2021)   PRAPARE - Hydrologist (Medical): No    Lack of Transportation (Non-Medical): No  Physical Activity: Insufficiently Active (07/19/2021)   Exercise Vital Sign    Days of Exercise per Week: 2 days    Minutes of Exercise per Session: 30 min  Stress: No Stress Concern Present (07/19/2021)   [place]    Feeling of Stress : Not at all  Social Connections: Not on file  Intimate Partner Violence: Not on file     Review of Systems: General: negative for chills, fever, night sweats or weight changes.  Cardiovascular: negative for chest pain, dyspnea on exertion, edema, orthopnea, palpitations, paroxysmal nocturnal dyspnea or shortness of breath Dermatological: negative for rash Respiratory: negative for cough or wheezing Urologic: negative for hematuria Abdominal: negative for nausea, vomiting, diarrhea, bright red blood per rectum, melena, or hematemesis Neurologic: negative for visual changes, syncope, or dizziness All other systems reviewed and are otherwise negative except as noted above.    Blood pressure (!) 164/72, pulse (!) 51, height 5' 8.5" (1.74 m), weight 253 lb (114.8 kg), last menstrual period 06/24/2018, SpO2 96 %.  General appearance: alert and no distress Neck: no adenopathy, no carotid bruit, no JVD, supple, symmetrical, trachea midline, and thyroid not enlarged, symmetric, no tenderness/mass/nodules Lungs: clear to auscultation bilaterally Heart: regular rate and rhythm, S1, S2 normal, no murmur, click, rub or gallop Extremities: extremities normal, atraumatic, no cyanosis or edema Pulses: 2+ and symmetric Skin:  Skin color, texture, turgor normal. No rashes or lesions Neurologic: Grossly normal  EKG not performed today  ASSESSMENT AND PLAN:   Arterial fibromuscular dysplasia (HCC) [name] returns for scheduling of her renal angiogram for FMD and potential renal vascular hypertension.  She was scheduled last month but unfortunately developed COVID prior to her procedure date which was canceled.  We will reschedule her sometime the next several weeks.     Misty Harp MD FACP,FACC,FAHA, [facility] 01/03/2023 11:40 AM

## 2023-01-03 NOTE — Assessment & Plan Note (Signed)
Misty Mahoney returns for scheduling of her renal angiogram for FMD and potential renal vascular hypertension.  She was scheduled last month but unfortunately developed COVID prior to her procedure date which was canceled.  We will reschedule her sometime the next several weeks.

## 2023-01-03 NOTE — Patient Instructions (Addendum)
Medication Instructions:  START: ASPIRIN- 68m DAILY UNTIL PROCEDURE- WILL LET YOU KNOW AFTER IF WE NEED TO CONTINUE *If you need a refill on your cardiac medications before your next appointment, please call your pharmacy*   Lab Work: Your physician recommends that you have labs drawn today: BMET & CBC  If you have labs (blood work) drawn today and your tests are completely normal, you will receive your results only by: MyChart Message (if you have MyChart) OR A paper copy in the mail If you have any lab test that is abnormal or we need to change your treatment, we will call you to review the results.   Testing/Procedures: Your physician has requested that you have a renal artery duplex ONE WEEK AFTER PROCEDURE. During this test, an ultrasound is used to evaluate blood flow to the kidneys. Take your medications as you usually do. This will take place at [address].  No food after 11PM the night before.  Water is OK. (Don't drink liquids if you have been instructed not to for ANOTHER test). Avoid foods that produce bowel gas, for 24 hours prior to exam (see below). No breakfast, no chewing gum, no smoking or carbonated beverages. Patient may take morning medications with water. Come in for test at least 15 minutes early to register.  To be done 1-2 weeks after procedure (2/22).   Follow-Up: At [facility] you and your health needs are our priority.  As part of our continuing mission to provide you with exceptional heart care, we have created designated Provider Care Teams.  These Care Teams include your primary Cardiologist (physician) and Advanced Practice Providers (APPs -  Physician Assistants and Nurse Practitioners) who all work together to provide you with the care you need, when you need it.  We recommend signing up for the patient portal called "MyChart".  Sign up information is provided on this After Visit Summary.  MyChart is used to connect with  patients for Virtual Visits (Telemedicine).  Patients are able to view lab/test results, encounter notes, upcoming appointments, etc.  Non-urgent messages can be sent to your provider as well.   To learn more about what you can do with MyChart, go to [email or web address]    Your next appointment:   2-3 week(s) after procedure (2/22)  Provider:   JQuay Burow MD   Other Instructions       Cardiac/Peripheral Catheterization   You are scheduled for a Peripheral Angiogram on Thursday, February 22 with Dr. JQuay Burow  1. Please [facility] [address] [id number] February 22 at 5:30 AM (This time is two hours before your procedure to ensure your preparation). Free valet parking service is available. You will check in at ADMITTING. The support person will be asked to wait in the waiting room.  It is OK to have someone drop you off and come back when you are ready to be discharged.        Special note: Every effort is made to have your procedure done on time. Please understand that emergencies sometimes delay scheduled procedures.   . 2. Diet: Do not eat solid foods after midnight.  You may have clear liquids until 5 AM the day of the procedure.  3. Labs: You will need to have blood drawn on TODAY  4. Medication instructions in preparation for your procedure:   HOLD CHLORTHALIDONE THE MORNING OF PROCEDURE   Contrast  Allergy: No  On the morning of your procedure, take Aspirin 81 mg and any morning medicines NOT listed above.  You may use sips of water.  5. Plan to go home the same day, you will only stay overnight if medically necessary. 6. You MUST have a responsible adult to drive you home. 7. An adult MUST be with you the first 24 hours after you arrive home. 8. Bring a current list of your medications, and the last time and date medication taken. 9. Bring ID and current insurance cards. 10.Please wear  clothes that are easy to get on and off and wear slip-on shoes.  Thank you for allowing [place] to care for you!   -- [facility]

## 2023-01-04 LAB — CBC
Hematocrit: 33.3 % — ABNORMAL LOW (ref 34.0–46.6)
Hemoglobin: 10.9 g/dL — ABNORMAL LOW (ref 11.1–15.9)
MCH: 28.5 pg (ref 26.6–33.0)
MCHC: 32.7 g/dL (ref 31.5–35.7)
MCV: 87 fL (ref 79–97)
Platelets: 293 10*3/uL (ref 150–450)
RBC: 3.83 x10E6/uL (ref 3.77–5.28)
RDW: 13 % (ref 11.7–15.4)
WBC: 8.7 10*3/uL (ref 3.4–10.8)

## 2023-01-04 LAB — BASIC METABOLIC PANEL
BUN/Creatinine Ratio: 33 — ABNORMAL HIGH (ref 9–23)
BUN: 31 mg/dL — ABNORMAL HIGH (ref 6–24)
CO2: 26 mmol/L (ref 20–29)
Calcium: 9.9 mg/dL (ref 8.7–10.2)
Chloride: 101 mmol/L (ref 96–106)
Creatinine, Ser: 0.95 mg/dL (ref 0.57–1.00)
Glucose: 99 mg/dL (ref 70–99)
Potassium: 4.6 mmol/L (ref 3.5–5.2)
Sodium: 141 mmol/L (ref 134–144)
eGFR: 70 mL/min/{1.73_m2} (ref >59)

## 2023-01-08 ENCOUNTER — Other Ambulatory Visit (HOSPITAL_COMMUNITY): Payer: Self-pay

## 2023-01-08 ENCOUNTER — Encounter (HOSPITAL_COMMUNITY): Payer: Commercial Managed Care - PPO

## 2023-01-08 ENCOUNTER — Other Ambulatory Visit: Payer: Self-pay

## 2023-01-08 MED ORDER — VALSARTAN 320 MG PO TABS
320.0000 mg | ORAL_TABLET | Freq: Every day | ORAL | 3 refills | Status: AC
  Filled 2023-01-08 – 2023-01-09 (×2): qty 90, 90d supply, fill #0
  Filled 2023-04-12: qty 90, 90d supply, fill #1

## 2023-01-09 ENCOUNTER — Telehealth: Payer: Self-pay | Admitting: *Deleted

## 2023-01-09 ENCOUNTER — Other Ambulatory Visit (HOSPITAL_COMMUNITY): Payer: Self-pay

## 2023-01-09 NOTE — Telephone Encounter (Signed)
Renal Angiography scheduled at [facility] for: Thursday January 11, 2023 7:30 AM Arrival time and place: [place] Entrance A at: 5:30 AM  Nothing to eat after midnight prior to procedure, clear liquids until 5 AM day of procedure.  Medication instructions: -Hold:  Chlorthalidone-AM of procedure -Other usual morning medications can be taken with sips of water including aspirin 81 mg.  Confirmed patient has responsible adult to drive home post procedure and be with patient first 24 hours after arriving home.  Patient reports no new symptoms concerning for COVID-19 in the past 10 days.  Reviewed procedure instructions with patient.

## 2023-01-11 ENCOUNTER — Other Ambulatory Visit: Payer: Self-pay

## 2023-01-11 ENCOUNTER — Ambulatory Visit (HOSPITAL_COMMUNITY)
Admission: RE | Admit: 2023-01-11 | Discharge: 2023-01-11 | Disposition: A | Discharge status: Home / Self Care | Payer: Commercial Managed Care - PPO | Attending: Cardiovascular Disease | Admitting: Cardiovascular Disease

## 2023-01-11 ENCOUNTER — Encounter (HOSPITAL_COMMUNITY)
Admission: RE | Disposition: A | Discharge status: Home / Self Care | Payer: Self-pay | Source: Home / Self Care | Attending: Cardiovascular Disease

## 2023-01-11 DIAGNOSIS — G4733 Obstructive sleep apnea (adult) (pediatric): Secondary | ICD-10-CM | POA: Diagnosis not present

## 2023-01-11 DIAGNOSIS — I701 Atherosclerosis of renal artery: Secondary | ICD-10-CM | POA: Diagnosis not present

## 2023-01-11 DIAGNOSIS — R0609 Other forms of dyspnea: Secondary | ICD-10-CM | POA: Diagnosis not present

## 2023-01-11 DIAGNOSIS — I773 Arterial fibromuscular dysplasia: Secondary | ICD-10-CM | POA: Diagnosis not present

## 2023-01-11 DIAGNOSIS — I10 Essential (primary) hypertension: Secondary | ICD-10-CM | POA: Insufficient documentation

## 2023-01-11 DIAGNOSIS — I150 Renovascular hypertension: Secondary | ICD-10-CM | POA: Diagnosis not present

## 2023-01-11 DIAGNOSIS — E785 Hyperlipidemia, unspecified: Secondary | ICD-10-CM | POA: Insufficient documentation

## 2023-01-11 DIAGNOSIS — I1A0 Resistant hypertension: Secondary | ICD-10-CM | POA: Diagnosis not present

## 2023-01-11 HISTORY — PX: RENAL ANGIOGRAPHY: CATH118260

## 2023-01-11 HISTORY — PX: RENAL INTERVENTION: CATH118261

## 2023-01-11 LAB — POCT ACTIVATED CLOTTING TIME
Activated Clotting Time: 276 seconds
Activated Clotting Time: 255 seconds
Activated Clotting Time: 282 seconds

## 2023-01-11 SURGERY — RENAL ANGIOGRAPHY
Anesthesia: LOCAL

## 2023-01-11 MED ORDER — SODIUM CHLORIDE 0.9 % WEIGHT BASED INFUSION
3.0000 mL/kg/h | INTRAVENOUS | Status: AC
  Administered 2023-01-11: 3 mL/kg/h via INTRAVENOUS

## 2023-01-11 MED ORDER — SODIUM CHLORIDE 0.9 % WEIGHT BASED INFUSION: 1.0000 mL/kg/h | INTRAVENOUS | Status: DC

## 2023-01-11 MED ORDER — SODIUM CHLORIDE 0.9 % IV SOLN: 250.0000 mL | INTRAVENOUS | Status: DC | PRN

## 2023-01-11 MED ORDER — HEPARIN (PORCINE) IN NACL 1000-0.9 UT/500ML-% IV SOLN
INTRAVENOUS | Status: DC | PRN
  Administered 2023-01-11 (×2): 500 mL

## 2023-01-11 MED ORDER — DILTIAZEM HCL ER COATED BEADS 240 MG PO CP24: 240.0000 mg | ORAL_CAPSULE | Freq: Every day | ORAL | Status: DC

## 2023-01-11 MED ORDER — MIDAZOLAM HCL 2 MG/2ML IJ SOLN
INTRAMUSCULAR | Status: AC
  Filled 2023-01-11: qty 2

## 2023-01-11 MED ORDER — FENTANYL CITRATE (PF) 100 MCG/2ML IJ SOLN
INTRAMUSCULAR | Status: AC
  Filled 2023-01-11: qty 2

## 2023-01-11 MED ORDER — LIDOCAINE HCL (PF) 1 % IJ SOLN
INTRAMUSCULAR | Status: DC | PRN
  Administered 2023-01-11: 10 mL

## 2023-01-11 MED ORDER — SODIUM CHLORIDE 0.9% FLUSH: 3.0000 mL | INTRAVENOUS | Status: DC | PRN

## 2023-01-11 MED ORDER — CHLORTHALIDONE 25 MG PO TABS: 25.0000 mg | ORAL_TABLET | Freq: Every day | ORAL | Status: DC

## 2023-01-11 MED ORDER — HEPARIN SODIUM (PORCINE) 1000 UNIT/ML IJ SOLN
INTRAMUSCULAR | Status: DC | PRN
  Administered 2023-01-11: 3000 [IU] via INTRAVENOUS
  Administered 2023-01-11: 11000 [IU] via INTRAVENOUS

## 2023-01-11 MED ORDER — FENTANYL CITRATE (PF) 100 MCG/2ML IJ SOLN
INTRAMUSCULAR | Status: DC | PRN
  Administered 2023-01-11 (×3): 25 ug via INTRAVENOUS

## 2023-01-11 MED ORDER — LABETALOL HCL 5 MG/ML IV SOLN: 10.0000 mg | INTRAVENOUS | Status: DC | PRN

## 2023-01-11 MED ORDER — CARVEDILOL 3.125 MG PO TABS: 3.1250 mg | ORAL_TABLET | Freq: Two times a day (BID) | ORAL | Status: DC

## 2023-01-11 MED ORDER — ESCITALOPRAM OXALATE 10 MG PO TABS: 10.0000 mg | ORAL_TABLET | Freq: Every day | ORAL | Status: DC

## 2023-01-11 MED ORDER — IODIXANOL 320 MG/ML IV SOLN
INTRAVENOUS | Status: DC | PRN
  Administered 2023-01-11: 150 mL

## 2023-01-11 MED ORDER — LIDOCAINE HCL (PF) 1 % IJ SOLN
INTRAMUSCULAR | Status: AC
  Filled 2023-01-11: qty 30

## 2023-01-11 MED ORDER — MIDAZOLAM HCL 2 MG/2ML IJ SOLN
INTRAMUSCULAR | Status: DC | PRN
  Administered 2023-01-11 (×3): .5 mg via INTRAVENOUS

## 2023-01-11 MED ORDER — CLOPIDOGREL BISULFATE 300 MG PO TABS
ORAL_TABLET | ORAL | Status: AC
  Filled 2023-01-11: qty 1

## 2023-01-11 MED ORDER — SODIUM CHLORIDE 0.9% FLUSH: 3.0000 mL | Freq: Two times a day (BID) | INTRAVENOUS | Status: DC

## 2023-01-11 MED ORDER — CLOPIDOGREL BISULFATE 300 MG PO TABS
ORAL_TABLET | ORAL | Status: DC | PRN
  Administered 2023-01-11: 300 mg via ORAL

## 2023-01-11 MED ORDER — ACETAMINOPHEN 325 MG PO TABS: 650.0000 mg | ORAL_TABLET | ORAL | Status: DC | PRN

## 2023-01-11 MED ORDER — HYDRALAZINE HCL 20 MG/ML IJ SOLN: 5.0000 mg | INTRAMUSCULAR | Status: DC | PRN

## 2023-01-11 MED ORDER — ASPIRIN 81 MG PO CHEW: 81.0000 mg | CHEWABLE_TABLET | ORAL | Status: AC

## 2023-01-11 SURGICAL SUPPLY — 20 items
BALLN VIATRAC 5X20X135 (BALLOONS) ×2
BALLN VIATRAC 6X20X135 (BALLOONS) ×2
BALLN ~~LOC~~ EMERGE MR 6.0X15 (BALLOONS) ×2
BALLOON VIATRAC 5X20X135 (BALLOONS) IMPLANT
BALLOON VIATRAC 6X20X135 (BALLOONS) IMPLANT
BALLOON ~~LOC~~ EMERGE MR 6.0X15 (BALLOONS) IMPLANT
CATH ANGIO 5F PIGTAIL 65CM (CATHETERS) IMPLANT
CATH TELEPORT (CATHETERS) IMPLANT
GUIDE CATH VISTA IMA 6F (CATHETERS) IMPLANT
GUIDE CATH VISTA RDC 6F (CATHETERS) IMPLANT
KIT ENCORE 26 ADVANTAGE (KITS) IMPLANT
KIT PV (KITS) ×3 IMPLANT
SHEATH PINNACLE 6F 10CM (SHEATH) IMPLANT
SYR MEDRAD MARK 7 150ML (SYRINGE) ×3 IMPLANT
TRANSDUCER W/STOPCOCK (MISCELLANEOUS) ×3 IMPLANT
TRAY PV CATH (CUSTOM PROCEDURE TRAY) ×3 IMPLANT
TUBING CONTRAST HIGH PRESS 48 (TUBING) IMPLANT
WIRE COUGAR XT STRL 300CM (WIRE) IMPLANT
WIRE HITORQ VERSACORE ST 145CM (WIRE) IMPLANT
WIRE STABILIZER XS .014X180CM (WIRE) IMPLANT

## 2023-01-11 NOTE — Progress Notes (Signed)
Right femoral artery sheath removed. Manual pressure held for 25 minutes. Site level 0. Instructions given and patient understands. Bedrest to begin at 1430

## 2023-01-11 NOTE — Progress Notes (Signed)
Patient complaining of bladder pressure, but she is unable to empty her bladder.  Message sent to Dr. Gwenlyn Found. tdh

## 2023-01-11 NOTE — Progress Notes (Signed)
Pt ambulated to and from bathroom with no signs of oozing from groin site

## 2023-01-11 NOTE — Interval H&P Note (Signed)
History and Physical Interval Note:  01/11/2023 7:40 AM  Misty Mahoney  has presented today for surgery, with the diagnosis of renal stenosis.  The various methods of treatment have been discussed with the patient and family. After consideration of risks, benefits and other options for treatment, the patient has consented to  Procedure(s): RENAL ANGIOGRAPHY (N/A) as a surgical intervention.  The patient's history has been reviewed, patient examined, no change in status, stable for surgery.  I have reviewed the patient's chart and labs.  Questions were answered to the patient's satisfaction.     Quay Burow

## 2023-01-12 ENCOUNTER — Telehealth: Payer: Self-pay | Admitting: Cardiovascular Disease

## 2023-01-12 ENCOUNTER — Encounter (HOSPITAL_COMMUNITY): Payer: Self-pay | Admitting: Cardiovascular Disease

## 2023-01-12 ENCOUNTER — Other Ambulatory Visit (HOSPITAL_COMMUNITY): Payer: Self-pay

## 2023-01-12 LAB — POCT ACTIVATED CLOTTING TIME
Activated Clotting Time: 234 seconds
Activated Clotting Time: 212 seconds
Activated Clotting Time: 190 seconds
Activated Clotting Time: 179 seconds
Activated Clotting Time: 168 seconds

## 2023-01-12 MED ORDER — CLOPIDOGREL BISULFATE 75 MG PO TABS
75.0000 mg | ORAL_TABLET | Freq: Every day | ORAL | 3 refills | Status: DC
  Filled 2023-01-12: qty 90, 90d supply, fill #0

## 2023-01-12 NOTE — Telephone Encounter (Signed)
Spoke with pt regarding her procedure yesterday and need for prescription for blood thinner. Per Dr. Gwenlyn Found, pt should be on plavix '75mg'$  daily. Sent in prescription to pt's pharmacy. Discussed medication information with pt. Pt verbalizes understanding.

## 2023-01-12 NOTE — Telephone Encounter (Signed)
Pt states she was told by Dr. Gwenlyn Found following her procedure yesterday she would be prescribed a blood thinner but she has not received anything yet. Please advise.

## 2023-01-18 ENCOUNTER — Ambulatory Visit (HOSPITAL_COMMUNITY)
Admission: RE | Admit: 2023-01-18 | Discharge: 2023-01-18 | Disposition: A | Discharge status: Home / Self Care | Payer: Commercial Managed Care - PPO | Source: Ambulatory Visit | Attending: Internal Medicine | Admitting: Internal Medicine

## 2023-01-18 ENCOUNTER — Encounter: Payer: Self-pay | Admitting: Cardiovascular Disease

## 2023-01-18 DIAGNOSIS — I773 Arterial fibromuscular dysplasia: Secondary | ICD-10-CM | POA: Diagnosis not present

## 2023-01-19 ENCOUNTER — Telehealth: Payer: Self-pay

## 2023-01-19 DIAGNOSIS — Z1231 Encounter for screening mammogram for malignant neoplasm of breast: Secondary | ICD-10-CM

## 2023-01-19 NOTE — Telephone Encounter (Signed)
Called patient to discuss Dr. Rosezella [place] recommendation that she have flank pain evaluated by ED. Patient states that her flank pain has begun to feel better since yesterday and she does not feel she needs to go to the ED. She also states she had a renal ultrasound yesterday and no one reported any urgent results to her regarding her kidneys at that time. Advised patient of risks of ignoring flank pain such as sepsis, kidney failure. Offered secondary plan for patient to report to urgent care where they may be able to check her creatinine. Patient states she will present to ED if her condition worsens but for now she declines to go to ED. She continues to deny urinary pain/frequency, blood in urine or fever.

## 2023-01-19 NOTE — Telephone Encounter (Signed)
Forwarded to DOD Dr. Percival Spanish regarding advice for management of renal symptoms after renal angiography.

## 2023-01-23 NOTE — Telephone Encounter (Signed)
I do not feel qualified and answering the question about potential flank pain after renal artery angioplasty.  I am forwarding this note to Dr. Kathlyn Sacramento who is covering from peripheral cardiology standpoint.  Renal artery Dopplers seem to be fairly normal.  Less than 50% narrowing in the artery that was treated.   Glenetta Hew, MD   Mo -- can you weigh in on this case -- Never did Renal PTA before, so not sure of complications.  [place]

## 2023-01-25 NOTE — Telephone Encounter (Signed)
I reviewed the angiogram and recent renal artery duplex.  I do not see anything unusual.  If she is feeling better, I think it is reasonable to wait until her follow-up with Dr. Gwenlyn Found.

## 2023-01-26 NOTE — Telephone Encounter (Signed)
Called patient to see if her flank pain was improved. Patient states her symptoms have resolved and she continues to have no urinary symptoms. Advised patient to keep her appointment with Dr. Gwenlyn Found. Patient verbalizes understanding.

## 2023-01-31 ENCOUNTER — Encounter: Payer: Self-pay | Admitting: Cardiovascular Disease

## 2023-01-31 ENCOUNTER — Ambulatory Visit: Payer: Commercial Managed Care - PPO | Attending: Cardiovascular Disease | Admitting: Cardiovascular Disease

## 2023-01-31 VITALS — BP 134/76 | HR 61 | Ht 68.5 in | Wt 254.0 lb

## 2023-01-31 DIAGNOSIS — I701 Atherosclerosis of renal artery: Secondary | ICD-10-CM

## 2023-01-31 NOTE — Patient Instructions (Signed)
Medication Instructions:  Your physician recommends that you continue on your current medications as directed. Please refer to the Current Medication list given to you today.  *If you need a refill on your cardiac medications before your next appointment, please call your pharmacy*   Follow-Up: At [facility], you and your health needs are our priority.  As part of our continuing mission to provide you with exceptional heart care, we have created designated Provider Care Teams.  These Care Teams include your primary Cardiologist (physician) and Advanced Practice Providers (APPs -  Physician Assistants and Nurse Practitioners) who all work together to provide you with the care you need, when you need it.  We recommend signing up for the patient portal called "MyChart".  Sign up information is provided on this After Visit Summary.  MyChart is used to connect with patients for Virtual Visits (Telemedicine).  Patients are able to view lab/test results, encounter notes, upcoming appointments, etc.  Non-urgent messages can be sent to your provider as well.   To learn more about what you can do with MyChart, go to [email or web address].    Your next appointment:   6 month(s)  Provider:   Jonathan Berry, MD    

## 2023-01-31 NOTE — Progress Notes (Signed)
Ms. Downie returns today after her recent right renal artery intervention which I performed 01/11/2023 for presumed renovascular hypertension secondary to FMD.  She did have renal Dopplers performed prior to the procedure as well as CTA.  She is on multiple antihypertensive medications.  She had a downgoing right renal artery making the intervention technically challenging.  I was able to perform PTA of the entire mid and distal right renal artery using a 5 mm balloon however I was not able to use a 6 mm balloon.  The renal Dopplers unfortunately did not show improvement in the systolic velocities within the right renal artery although her blood pressures at home have been marginally improved down to the [id number] systolic range.  [place] and suspects that her stress level will decrease as well.  If she needs this to be performed again in the future she will need to have it performed via the left brachial approach given the downgoing nature of the right renal artery.  I will keep her on clopidogrel for 3 months.  I will see her back in 6 months for follow-up.   Lorretta Harp, M.D., [place], [place] [address]. [place], [place]  [id number]  [phone] 01/31/2023 9:19 AM

## 2023-02-06 DIAGNOSIS — G4733 Obstructive sleep apnea (adult) (pediatric): Secondary | ICD-10-CM | POA: Diagnosis not present

## 2023-02-19 ENCOUNTER — Ambulatory Visit (HOSPITAL_BASED_OUTPATIENT_CLINIC_OR_DEPARTMENT_OTHER): Payer: Commercial Managed Care - PPO | Admitting: Cardiovascular Disease

## 2023-03-09 DIAGNOSIS — G4733 Obstructive sleep apnea (adult) (pediatric): Secondary | ICD-10-CM | POA: Diagnosis not present

## 2023-04-08 DIAGNOSIS — G4733 Obstructive sleep apnea (adult) (pediatric): Secondary | ICD-10-CM | POA: Diagnosis not present

## 2023-05-09 DIAGNOSIS — G4733 Obstructive sleep apnea (adult) (pediatric): Secondary | ICD-10-CM | POA: Diagnosis not present

## 2023-07-09 ENCOUNTER — Other Ambulatory Visit: Payer: Self-pay | Admitting: General Surgery

## 2023-07-09 DIAGNOSIS — Z1239 Encounter for other screening for malignant neoplasm of breast: Secondary | ICD-10-CM

## 2023-07-09 DIAGNOSIS — Z803 Family history of malignant neoplasm of breast: Secondary | ICD-10-CM

## 2023-07-12 ENCOUNTER — Telehealth: Payer: Self-pay

## 2023-07-12 NOTE — Telephone Encounter (Signed)
Surgeon: Dr. Gordy Levan

## 2023-07-12 NOTE — Telephone Encounter (Signed)
   Name: Misty Mahoney  DOB: Sep 09, 1966  MRN: [id number]  Primary Cardiologist: Sherryl Manges, MD  Chart reviewed as part of pre-operative protocol coverage. The patient has an upcoming visit scheduled with Dr. Allyson Sabal on 08/01/2023 at which time clearance can be addressed in case there are any issues that would impact surgical recommendations.  . I added preop FYI to appointment note so that provider is aware to address at time of outpatient visit.  Per office protocol the cardiology provider should forward their finalized clearance decision and recommendations regarding antiplatelet therapy to the requesting party below.    I will route this message as FYI to requesting party and remove this message from the preop box as separate preop APP input not needed at this time.   Please call with any questions.  Napoleon Form, Leodis Rains, NP  07/12/2023, 4:30 PM

## 2023-07-12 NOTE — Telephone Encounter (Signed)
   Pre-operative Risk Assessment    Patient Name: Misty Mahoney  DOB: 1966/02/10 MRN: [id number]   LAST APPOINTMENT: 02/19/2023 Chilton Si  NEXT APPOINTMENT:08/01/2023 [name]    Request for Surgical Clearance    Procedure:   Umbilical Hernia Repair  Date of Surgery:  Clearance TBD                                 Surgeon:  Not Listed Surgeon's Group or Practice Name:  [facility]  Phone number:  [phone] Fax number:  [phone]   Type of Clearance Requested:   - Medical  - Pharmacy:  Hold Aspirin and Clopidogrel (Plavix)     Type of Anesthesia:  General    Additional requests/questions:   N/A  [name]   07/12/2023, 3:56 PM

## 2023-07-26 ENCOUNTER — Encounter (HOSPITAL_COMMUNITY): Payer: Commercial Managed Care - PPO

## 2023-07-30 ENCOUNTER — Ambulatory Visit (HOSPITAL_COMMUNITY)
Admission: RE | Admit: 2023-07-30 | Discharge: 2023-07-30 | Disposition: A | Discharge status: Home / Self Care | Payer: Managed Care, Other (non HMO) | Source: Ambulatory Visit | Attending: Cardiovascular Disease | Admitting: Cardiovascular Disease

## 2023-07-30 DIAGNOSIS — I701 Atherosclerosis of renal artery: Secondary | ICD-10-CM | POA: Diagnosis present

## 2023-07-31 ENCOUNTER — Encounter: Payer: Self-pay | Admitting: Cardiovascular Disease

## 2023-07-31 DIAGNOSIS — I701 Atherosclerosis of renal artery: Secondary | ICD-10-CM

## 2023-07-31 DIAGNOSIS — I773 Arterial fibromuscular dysplasia: Secondary | ICD-10-CM

## 2023-08-01 ENCOUNTER — Ambulatory Visit: Payer: Commercial Managed Care - PPO | Admitting: Cardiovascular Disease

## 2023-08-01 ENCOUNTER — Ambulatory Visit: Payer: Managed Care, Other (non HMO) | Admitting: Cardiovascular Disease

## 2023-08-07 ENCOUNTER — Encounter: Payer: Self-pay | Admitting: Internal Medicine

## 2023-08-13 ENCOUNTER — Telehealth: Payer: Self-pay | Admitting: Cardiovascular Disease

## 2023-08-13 NOTE — Telephone Encounter (Signed)
New Message:     Misty Mahoney is calling to check on the status of this patient's clearance.

## 2023-08-13 NOTE — Telephone Encounter (Signed)
Dr. Allyson Sabal,  You performed PTA of the right renal artery on 01/11/2023. Per office protocol, will you please comment on holding aspirin and Plavix for umbilical hernia repair?  Please route your response to P CV DIV Preop. I will communicate with requesting office once you have given recommendations.   Thank you!  Carlos Levering, NP

## 2023-08-15 NOTE — Telephone Encounter (Signed)
Patient states that she is no longer taking aspirin or plavix. She states that she was told to stop the medication 3 months after her Renal Angiography. Patient also states that she is not sure rather or not she is going to have procedure listed on clearance. She states that she prefers to call us back when she decides. She also wants to know if she is suppose to be taking aspirin and plavix?

## 2023-08-15 NOTE — Telephone Encounter (Signed)
Will route this message to Dr. Allyson Sabal as preop team would not be the ones handling whether her ASA and Plavix are medically indicated. I do see mention from Dr. Hazle Coca note in 01/2023 that he planned Plavix for 3 months but did not specify stopping aspirin. Dr. Allyson Sabal, please route your reply to your nurse to let the patient know what to do with her antiplatelets. Thanks!  Otherwise since patient does not think she is going to pursue procedure requested, will remove from preop box until she notifies [place] otherwise.

## 2023-08-15 NOTE — Telephone Encounter (Signed)
Name: Misty Mahoney  DOB: October 29, 1966  MRN: [id number]  Primary Cardiologist: Sherryl Manges, MD   Preoperative team, please contact this patient and set up a phone call appointment for further preoperative risk assessment. Please obtain consent and complete medication review. Thank you for your help.  I confirm that guidance regarding antiplatelet and oral anticoagulation therapy has been completed and, if necessary, noted below.  Patients DAPT may be held for 5-7 days prior to her surgery. Please resume as soon as hemostasis is achieved.    Ronney Asters, NP 08/15/2023, 1:13 PM  [facility]

## 2023-08-15 NOTE — Telephone Encounter (Signed)
While we are awaiting reply from Dr. Allyson Sabal regarding holding antiplatelets, will route to callback to please get details of surgery. Is this related to the 8/22 clearance? No further details attached to this chart.Anticipate will need VV after MD reply.

## 2023-08-21 ENCOUNTER — Other Ambulatory Visit: Payer: Self-pay | Admitting: Family Medicine

## 2023-08-21 DIAGNOSIS — M25562 Pain in left knee: Secondary | ICD-10-CM

## 2023-08-29 ENCOUNTER — Encounter: Payer: Self-pay | Admitting: Family Medicine

## 2023-09-01 ENCOUNTER — Other Ambulatory Visit: Payer: Managed Care, Other (non HMO)

## 2023-09-03 ENCOUNTER — Other Ambulatory Visit (HOSPITAL_BASED_OUTPATIENT_CLINIC_OR_DEPARTMENT_OTHER): Payer: Self-pay

## 2023-09-03 ENCOUNTER — Other Ambulatory Visit (HOSPITAL_COMMUNITY): Payer: Self-pay

## 2023-09-04 ENCOUNTER — Inpatient Hospital Stay
Admission: RE | Admit: 2023-09-04 | Discharge: 2023-09-04 | Discharge status: Home / Self Care | Payer: Self-pay | Source: Ambulatory Visit | Attending: General Surgery | Admitting: General Surgery

## 2023-09-04 DIAGNOSIS — Z803 Family history of malignant neoplasm of breast: Secondary | ICD-10-CM

## 2023-09-04 DIAGNOSIS — Z1239 Encounter for other screening for malignant neoplasm of breast: Secondary | ICD-10-CM

## 2023-09-04 MED ORDER — GADOPICLENOL 0.5 MMOL/ML IV SOLN
9.0000 mL | Freq: Once | INTRAVENOUS | Status: AC | PRN
  Administered 2023-09-04: 9 mL via INTRAVENOUS

## 2023-10-16 ENCOUNTER — Telehealth: Payer: Self-pay | Admitting: Radiology

## 2023-10-16 NOTE — Telephone Encounter (Signed)
Per chart, patient has now been scheduled for 10/24/2023.

## 2023-10-16 NOTE — Telephone Encounter (Signed)
thx

## 2023-10-16 NOTE — Telephone Encounter (Signed)
Received message from Dr. Prince Rome that you were going to obtain prior auth for left knee gel injection. Patient is new to you.  Is this for OA? Were you going to see the patient in the office prior?

## 2023-10-16 NOTE — Telephone Encounter (Signed)
Yes pls tomorrow if possible if not then next week thx

## 2023-10-16 NOTE — Telephone Encounter (Signed)
I left voicemail for patient advising I need to make her an appointment with Dr. August Saucer for her left knee prior to obtaining authorization for left knee gel injection. Asked for return call.

## 2023-10-16 NOTE — Telephone Encounter (Signed)
FYI- I left message for patient to return my call to get her scheduled for office visit so that April can submit for gel injection. It looks like she has spoken with Elonda Husky and is now scheduled for 10/24/2023.  April will stop obtaining authorizations for gel injections for 2024 on 10/26/2023.  Wanted you to be aware.

## 2023-10-24 ENCOUNTER — Ambulatory Visit (INDEPENDENT_AMBULATORY_CARE_PROVIDER_SITE_OTHER): Payer: Managed Care, Other (non HMO) | Admitting: Orthopedic Surgery

## 2023-10-24 ENCOUNTER — Telehealth: Payer: Self-pay

## 2023-10-24 DIAGNOSIS — M1712 Unilateral primary osteoarthritis, left knee: Secondary | ICD-10-CM | POA: Diagnosis not present

## 2023-10-24 NOTE — Telephone Encounter (Signed)
Auth needed for left knee gel 

## 2023-10-25 ENCOUNTER — Other Ambulatory Visit: Payer: Self-pay | Admitting: General Surgery

## 2023-10-26 ENCOUNTER — Encounter: Payer: Self-pay | Admitting: Orthopedic Surgery

## 2023-10-26 ENCOUNTER — Ambulatory Visit: Payer: Managed Care, Other (non HMO) | Admitting: Orthopedic Surgery

## 2023-10-26 MED ORDER — METHYLPREDNISOLONE ACETATE 40 MG/ML IJ SUSP
40.0000 mg | INTRAMUSCULAR | Status: AC | PRN
Start: 2023-10-24 — End: 2023-10-24
  Administered 2023-10-24: 40 mg via INTRA_ARTICULAR

## 2023-10-26 MED ORDER — BUPIVACAINE HCL 0.25 % IJ SOLN
4.0000 mL | INTRAMUSCULAR | Status: AC | PRN
Start: 2023-10-24 — End: 2023-10-24
  Administered 2023-10-24: 4 mL via INTRA_ARTICULAR

## 2023-10-26 MED ORDER — LIDOCAINE HCL 1 % IJ SOLN
5.0000 mL | INTRAMUSCULAR | Status: AC | PRN
Start: 2023-10-24 — End: 2023-10-24
  Administered 2023-10-24: 5 mL

## 2023-10-26 NOTE — Progress Notes (Signed)
No GI  Office Visit Note   Patient: Misty Mahoney           Date of Birth: [birth year]           MRN: [id number] Visit Date: 10/24/2023 Requested by: Lavada Mesi, MD [address] [place],  [place] [address] PCP: Lavada Mesi, MD  Subjective: Chief Complaint  Patient presents with   Left Knee - Pain    HPI: Misty Mahoney is a 57 y.o. female who presents to the office reporting left knee pain.  She describes history of prior surgery which was arthroscopy and debridement in 2017.  She reports recurrent pain in the left knee of acute onset of several months duration.  Likely related to going up and down the stairs while moving.  She is actually living in [place].  [place] lot of steps.  She feels like she is lost some motion in her knee.  She does do cycling twice a week for 4 to 5 miles which helps her knee to limber up.  She has had an MRI scan which is reviewed.  This shows primarily degenerative stable medial meniscal pathology with significant tricompartmental arthritis worse on the medial side.  She has family here in town..                ROS: All systems reviewed are negative as they relate to the chief complaint within the history of present illness.  Patient denies fevers or chills.  Assessment & Plan: Visit Diagnoses: No diagnosis found.  Plan: Impression is no definite arthroscopically treatable lesion in that left knee.  We going to try to delay knee replacement as long as possible.  Cortisone injection performed today.  Preapproved for gel.  Continue with nonweightbearing quad strengthening exercises.  We can inject the gel and start prior knee injection process every 3 month injections once we start with the gel shot next year.  Patient tolerated the cortisone injection well today.  Follow-Up Instructions: No follow-ups on file.   Orders:  No orders of the defined types were placed in this encounter.  No orders of the defined  types were placed in this encounter.     Procedures: Large Joint Inj: L knee on 10/24/2023 10:00 PM Indications: diagnostic evaluation, joint swelling and pain Details: 18 G 1.5 in needle, superolateral approach  Arthrogram: No  Medications: 5 mL lidocaine 1 %; 40 mg methylPREDNISolone acetate 40 MG/ML; 4 mL bupivacaine 0.25 % Outcome: tolerated well, no immediate complications Procedure, treatment alternatives, risks and benefits explained, specific risks discussed. Consent was given by the patient. Immediately prior to procedure a time out was called to verify the correct patient, procedure, equipment, support staff and site/side marked as required. Patient was prepped and draped in the usual sterile fashion.       Clinical Data: No additional findings.  Objective: Vital Signs: LMP 06/24/2018   Physical Exam:  Constitutional: Patient appears well-developed HEENT:  Head: Normocephalic Eyes:EOM are normal Neck: Normal range of motion Cardiovascular: Normal rate Pulmonary/chest: Effort normal Neurologic: Patient is alert Skin: Skin is warm Psychiatric: Patient has normal mood and affect  Ortho Exam: Ortho exam demonstrates range of motion on the left of 3 degrees to full flexion.  On the left.  Full range of motion on the right.  No effusion in either knee.  Patellofemoral crepitus mild bilaterally.  Collateral cruciate ligaments are stable.  No groin pain with internal/external Joslyn Devon of  the leg.  No other masses lymphadenopathy or skin changes noted in that knee region.  Patient has medial greater than lateral joint line tenderness.  10 minutes leg waiting pecialty Comments:  No specialty comments available.  Imaging: No results found.   PMFS History: Patient Active Problem List   Diagnosis Date Noted   Atherosclerosis 06/26/2022   Arterial fibromuscular dysplasia (HCC) 11/29/2021   Prediabetes 07/19/2021   Antalgic gait 12/05/2018   Right carpal tunnel syndrome  11/27/2018   Low metanephrine in plasma, urine, and cerebrospinal fluid 02/06/2018   Aneurysm (HCC) 12/16/2017   Adrenal adenoma 12/13/2017   Right lateral epicondylitis 03/27/2017   Thoracic back pain 11/01/2016   Nonallopathic lesion of thoracic region 11/01/2016   Nonallopathic lesion of lumbosacral region 11/01/2016   Nonallopathic lesion of sacral region 11/01/2016   Exertional dyspnea 03/31/2016   Hyperglycemia 12/26/2015   Preventative health care 12/26/2015   Vitamin D deficiency 12/26/2015   Hyperlipidemia 12/20/2015   Depression 06/15/2015   H/O gestational diabetes mellitus, not currently pregnant 04/10/2014   Anemia 04/10/2014   Obesity 04/10/2014   History of chicken pox    Umbilical hernia 03/24/2013   SVT (supraventricular tachycardia) (HCC) 07/18/2012   Hypertension 07/18/2012   Atrial fibrillation (HCC)    OSA (obstructive sleep apnea)    Past Medical History:  Diagnosis Date   Anemia    gestational   Arterial fibromuscular dysplasia (HCC) 11/29/2021   Atherosclerosis 06/26/2022   Atypical mole 07/24/2012   right abdomen moderate   Atypical mole 07/10/2018   right supra pubic moderate   Chicken pox as a child   Depression 06/15/2015   DM, gestational, diet controlled 04/10/2014   insulin resistant- A1C5.7    GERD (gastroesophageal reflux disease)    undx'd GERD    Hyperlipidemia 12/20/2015   Hypertension    Obesity, unspecified 04/10/2014   OSA (obstructive sleep apnea)    uses cpap   Osteoarthritis    Paroxysmal atrial fibrillation (HCC)    Prediabetes    SVT (supraventricular tachycardia) (HCC)    s/p RFCA 09/26/12   SVT (supraventricular tachycardia) (HCC)    a. s/p slow pathway modification 2013 Dr Ladona Ridgel   Vitamin D deficiency 12/26/2015    Family History  Problem Relation Age of Onset   Hypertension Mother    Dementia Mother    Stroke Mother    Atrial fibrillation Mother    Atrial fibrillation Father    Dementia Father    Hypertension  Father    Cataracts Father        bilateral   Alzheimer's disease Father    Colon polyps Father    Cancer Sister 66       metastatic   Breast cancer Sister 32   Hypertension Brother    Colon cancer Maternal Grandmother    Heart attack Maternal Grandfather        pacemaker   Cancer Maternal Grandfather        ?   Cancer Paternal Grandmother        colon   ADD / ADHD Daughter    ADD / ADHD Son    Esophageal cancer Neg Hx    Rectal cancer Neg Hx    Stomach cancer Neg Hx     Past Surgical History:  Procedure Laterality Date   ADENOIDECTOMY     BREAST LUMPECTOMY WITH RADIOACTIVE SEED LOCALIZATION Right 08/03/2020   Procedure: RIGHT BREAST EXCISIONAL BIOPSY WITH RADIOACTIVE SEED LOCALIZATION;  Surgeon: [name], MD;  Location: [facility];  Service: General;  Laterality: Right;   ENDOVENOUS ABLATION SAPHENOUS VEIN W/ LASER Bilateral    Thermal ablation   KNEE ARTHROSCOPY WITH MENISCAL REPAIR Left 08/25/2016   Procedure: KNEE ARTHROSCOPY WITH partial medial meniscectomy, chondroplasty;  Surgeon: Marcene Corning, MD;  Location: [facility];  Service: Orthopedics;  Laterality: Left;  KNEE ARTHROSCOPY WITH partial medial meniscectomy, chondroplasty   PALATE / UVULA BIOPSY / EXCISION     RENAL ANGIOGRAPHY N/A 01/11/2023   Procedure: RENAL ANGIOGRAPHY;  Surgeon: Runell Gess, MD;  Location: [facility] INVASIVE CV LAB;  Service: Cardiovascular;  Laterality: N/A;   RENAL INTERVENTION  01/11/2023   Procedure: RENAL INTERVENTION;  Surgeon: Runell Gess, MD;  Location: [facility] INVASIVE CV LAB;  Service: Cardiovascular;;   TONSILLECTOMY  57 yrs old   V-TACH ABLATION N/A 09/26/2012   Procedure: V-TACH ABLATION;  Surgeon: Marinus Maw, MD;  Location: [facility] CATH LAB;  Service: Cardiovascular;  Laterality: N/A;   WISDOM TOOTH EXTRACTION  57 yrs old   Social History   Occupational History   Occupation: Charity fundraiser  Tobacco Use   Smoking status: Never    Passive exposure:  Never   Smokeless tobacco: Never  Vaping Use   Vaping status: Never Used  Substance and Sexual Activity   Alcohol use: Yes    Comment: two or three times a year   Drug use: No   Sexual activity: Yes    Partners: Male    Comment: lives with kids, significant other and step daughter. minimizing dairy and gluten, exercise

## 2023-11-07 NOTE — Telephone Encounter (Signed)
VOB submitted for Durolane, left knee.

## 2023-12-21 NOTE — Progress Notes (Signed)
Surgical Instructions   Your procedure is scheduled on Wednesday, February 26th, 2025. Report to [facility] Main Entrance "A" at 6:30 A.M., then check in with the Admitting office. Any questions or running late day of surgery: call [phone]  Questions prior to your surgery date: call [phone], Monday-Friday, 8am-4pm. If you experience any cold or flu symptoms such as cough, fever, chills, shortness of breath, etc. between now and your scheduled surgery, please notify us at the above number.     Remember:  Do not eat after midnight the night before your surgery   You may drink clear liquids until 5:30 the morning of your surgery.   Clear liquids allowed are: Water, Non-Citrus Juices (without pulp), Carbonated Beverages, Clear Tea (no milk, honey, etc.), Black Coffee Only (NO MILK, CREAM OR POWDERED CREAMER of any kind), and Gatorade.  Patient Instructions  The night before surgery:  No food after midnight. ONLY clear liquids after midnight   The day of surgery (if you have diabetes): Drink ONE (1) 12 oz G2 given to you in your pre admission testing appointment by 5:30 the morning of surgery. Drink in one sitting. Do not sip.  This drink was given to you during your hospital  pre-op appointment visit.  Nothing else to drink after completing the  12 oz bottle of G2.         If you have questions, please contact your surgeon's office.     Take these medicines the morning of surgery with A SIP OF WATER: Carvedilol (Coreg) Diltiazem (Cardizem) Escitalopram (Lexapro)   May take these medicines IF NEEDED: Flecainide (Tambocor) Hydralazine (Apresoline)   One week prior to surgery, STOP taking any Aspirin (unless otherwise instructed by your surgeon) Aleve, Naproxen, Ibuprofen, Motrin, Advil, Goody's, BC's, all herbal medications, fish oil, and non-prescription vitamins.    HOW TO MANAGE YOUR DIABETES BEFORE AND AFTER SURGERY  Why is it important to control my blood  sugar before and after surgery? Improving blood sugar levels before and after surgery helps healing and can limit problems. A way of improving blood sugar control is eating a healthy diet by:  Eating less sugar and carbohydrates  Increasing activity/exercise  Talking with your doctor about reaching your blood sugar goals High blood sugars (greater than 180 mg/dL) can raise your risk of infections and slow your recovery, so you will need to focus on controlling your diabetes during the weeks before surgery. Make sure that the doctor who takes care of your diabetes knows about your planned surgery including the date and location.  How do I manage my blood sugar before surgery? Check your blood sugar at least 4 times a day, starting 2 days before surgery, to make sure that the level is not too high or low.  Check your blood sugar the morning of your surgery when you wake up and every 2 hours until you get to the Short Stay unit.  If your blood sugar is less than 70 mg/dL, you will need to treat for low blood sugar: Do not take insulin. Treat a low blood sugar (less than 70 mg/dL) with  cup of clear juice (cranberry or apple), 4 glucose tablets, OR glucose gel. Recheck blood sugar in 15 minutes after treatment (to make sure it is greater than 70 mg/dL). If your blood sugar is not greater than 70 mg/dL on recheck, call [phone] for further instructions. Report your blood sugar to the short stay nurse when you get to Short Stay.  If you  are admitted to the hospital after surgery: Your blood sugar will be checked by the staff and you will probably be given insulin after surgery (instead of oral diabetes medicines) to make sure you have good blood sugar levels. The goal for blood sugar control after surgery is 80-180 mg/dL.                      Do NOT Smoke (Tobacco/Vaping) for 24 hours prior to your procedure.  If you use a CPAP at night, you may bring your mask/headgear for your overnight  stay.   You will be asked to remove any contacts, glasses, piercing's, hearing aid's, dentures/partials prior to surgery. Please bring cases for these items if needed.    Patients discharged the day of surgery will not be allowed to drive home, and someone needs to stay with them for 24 hours.  SURGICAL WAITING ROOM VISITATION Patients may have no more than 2 support people in the waiting area - these visitors may rotate.   Pre-op nurse will coordinate an appropriate time for 1 ADULT support person, who may not rotate, to accompany patient in pre-op.  Children under the age of 101 must have an adult with them who is not the patient and must remain in the main waiting area with an adult.  If the patient needs to stay at the hospital during part of their recovery, the visitor guidelines for inpatient rooms apply.  Please refer to the [facility] website for the visitor guidelines for any additional information.   If you received a COVID test during your pre-op visit  it is requested that you wear a mask when out in public, stay away from anyone that may not be feeling well and notify your surgeon if you develop symptoms. If you have been in contact with anyone that has tested positive in the last 10 days please notify you surgeon.      Pre-operative CHG Bathing Instructions   You can play a key role in reducing the risk of infection after surgery. Your skin needs to be as free of germs as possible. You can reduce the number of germs on your skin by washing with CHG (chlorhexidine gluconate) soap before surgery. CHG is an antiseptic soap that kills germs and continues to kill germs even after washing.   DO NOT use if you have an allergy to chlorhexidine/CHG or antibacterial soaps. If your skin becomes reddened or irritated, stop using the CHG and notify one of our RNs at [phone].              TAKE A SHOWER THE NIGHT BEFORE SURGERY AND THE DAY OF SURGERY    Please keep in mind the  following:  DO NOT shave, including legs and underarms, 48 hours prior to surgery.   You may shave your face before/day of surgery.  Place clean sheets on your bed the night before surgery Use a clean washcloth (not used since being washed) for each shower. DO NOT sleep with pet's night before surgery.  CHG Shower Instructions:  Wash your face and private area with normal soap. If you choose to wash your hair, wash first with your normal shampoo.  After you use shampoo/soap, rinse your hair and body thoroughly to remove shampoo/soap residue.  Turn the water OFF and apply half the bottle of CHG soap to a CLEAN washcloth.  Apply CHG soap ONLY FROM YOUR NECK DOWN TO YOUR TOES (washing for 3-5 minutes)  DO  NOT use CHG soap on face, private areas, open wounds, or sores.  Pay special attention to the area where your surgery is being performed.  If you are having back surgery, having someone wash your back for you may be helpful. Wait 2 minutes after CHG soap is applied, then you may rinse off the CHG soap.  Pat dry with a clean towel  Put on clean pajamas    Additional instructions for the day of surgery: DO NOT APPLY any lotions, deodorants, cologne, or perfumes.   Do not wear jewelry or makeup Do not wear nail polish, gel polish, artificial nails, or any other type of covering on natural nails (fingers and toes) Do not bring valuables to the hospital. [facility] is not responsible for valuables/personal belongings. Put on clean/comfortable clothes.  Please brush your teeth.  Ask your nurse before applying any prescription medications to the skin.

## 2023-12-24 ENCOUNTER — Other Ambulatory Visit: Payer: Self-pay

## 2023-12-24 ENCOUNTER — Encounter (HOSPITAL_BASED_OUTPATIENT_CLINIC_OR_DEPARTMENT_OTHER): Payer: Self-pay

## 2023-12-24 ENCOUNTER — Encounter (HOSPITAL_COMMUNITY)
Admission: RE | Admit: 2023-12-24 | Discharge: 2023-12-24 | Disposition: A | Discharge status: Home / Self Care | Payer: Managed Care, Other (non HMO) | Source: Ambulatory Visit | Attending: General Surgery | Admitting: General Surgery

## 2023-12-24 ENCOUNTER — Encounter (HOSPITAL_COMMUNITY): Payer: Self-pay

## 2023-12-24 VITALS — BP 167/74 | HR 63 | Temp 98.3°F | Resp 19 | Ht 68.0 in | Wt 260.9 lb

## 2023-12-24 DIAGNOSIS — I2510 Atherosclerotic heart disease of native coronary artery without angina pectoris: Secondary | ICD-10-CM | POA: Diagnosis not present

## 2023-12-24 DIAGNOSIS — Z01818 Encounter for other preprocedural examination: Secondary | ICD-10-CM | POA: Insufficient documentation

## 2023-12-24 HISTORY — DX: Chronic kidney disease, unspecified: N18.9

## 2023-12-24 HISTORY — DX: Peripheral vascular disease, unspecified: I73.9

## 2023-12-24 HISTORY — DX: Myoneural disorder, unspecified: G70.9

## 2023-12-24 HISTORY — DX: Prediabetes: R73.03

## 2023-12-24 HISTORY — DX: Cardiac arrhythmia, unspecified: I49.9

## 2023-12-24 LAB — CBC
HCT: 35 % — ABNORMAL LOW (ref 36.0–46.0)
Hemoglobin: 11.2 g/dL — ABNORMAL LOW (ref 12.0–15.0)
MCH: 28.6 pg (ref 26.0–34.0)
MCHC: 32 g/dL (ref 30.0–36.0)
MCV: 89.5 fL (ref 80.0–100.0)
Platelets: 233 10*3/uL (ref 150–400)
RBC: 3.91 MIL/uL (ref 3.87–5.11)
RDW: 13.4 % (ref 11.5–15.5)
WBC: 6.2 10*3/uL (ref 4.0–10.5)
nRBC: 0 % (ref 0.0–0.2)

## 2023-12-24 LAB — BASIC METABOLIC PANEL
Anion gap: 9 (ref 5–15)
BUN: 18 mg/dL (ref 6–20)
CO2: 25 mmol/L (ref 22–32)
Calcium: 9.1 mg/dL (ref 8.9–10.3)
Chloride: 107 mmol/L (ref 98–111)
Creatinine, Ser: 0.79 mg/dL (ref 0.44–1.00)
GFR, Estimated: >60 mL/min (ref >60)
Glucose, Bld: 110 mg/dL — ABNORMAL HIGH (ref 70–99)
Potassium: 3.8 mmol/L (ref 3.5–5.1)
Sodium: 141 mmol/L (ref 135–145)

## 2023-12-24 NOTE — Progress Notes (Signed)
Surgical Instructions   Your procedure is scheduled on Wednesday, February 26th, 2025. Report to [facility] Main Entrance "A" at 6:30 A.M., then check in with the Admitting office. Any questions or running late day of surgery: call [phone]  Questions prior to your surgery date: call [phone], Monday-Friday, 8am-4pm. If you experience any cold or flu symptoms such as cough, fever, chills, shortness of breath, etc. between now and your scheduled surgery, please notify us at the above number.     Remember:  Do not eat after midnight the night before your surgery   You may drink clear liquids until 5:30 the morning of your surgery.   Clear liquids allowed are: Water, Non-Citrus Juices (without pulp), Carbonated Beverages, Clear Tea (no milk, honey, etc.), Black Coffee Only (NO MILK, CREAM OR POWDERED CREAMER of any kind), and Gatorade.  Patient Instructions  The night before surgery:  No food after midnight. ONLY clear liquids after midnight  The day of surgery (if you do NOT have diabetes):  Drink ONE (1) Pre-Surgery Clear Ensure by 5:30 am the morning of surgery. Drink in one sitting. Do not sip.  This drink was given to you during your hospital  pre-op appointment visit.  Nothing else to drink after completing the  Pre-Surgery Clear Ensure.         If you have questions, please contact your surgeon's office.     Take these medicines the morning of surgery with A SIP OF WATER: Carvedilol (Coreg) Diltiazem (Cardizem) Escitalopram (Lexapro)   May take these medicines IF NEEDED: Flecainide (Tambocor) Hydralazine (Apresoline)   One week prior to surgery, STOP taking any Aspirin (unless otherwise instructed by your surgeon) Aleve, Naproxen, Ibuprofen, Motrin, Advil, Goody's, BC's, all herbal medications, fish oil, and non-prescription vitamins.                     Do NOT Smoke (Tobacco/Vaping) for 24 hours prior to your procedure.  If you use a CPAP at night, you  may bring your mask/headgear for your overnight stay.   You will be asked to remove any contacts, glasses, piercing's, hearing aid's, dentures/partials prior to surgery. Please bring cases for these items if needed.    Patients discharged the day of surgery will not be allowed to drive home, and someone needs to stay with them for 24 hours.  SURGICAL WAITING ROOM VISITATION Patients may have no more than 2 support people in the waiting area - these visitors may rotate.   Pre-op nurse will coordinate an appropriate time for 1 ADULT support person, who may not rotate, to accompany patient in pre-op.  Children under the age of 83 must have an adult with them who is not the patient and must remain in the main waiting area with an adult.  If the patient needs to stay at the hospital during part of their recovery, the visitor guidelines for inpatient rooms apply.  Please refer to the [facility] website for the visitor guidelines for any additional information.   If you received a COVID test during your pre-op visit  it is requested that you wear a mask when out in public, stay away from anyone that may not be feeling well and notify your surgeon if you develop symptoms. If you have been in contact with anyone that has tested positive in the last 10 days please notify you surgeon.      Pre-operative CHG Bathing Instructions   You can play a key role in reducing  the risk of infection after surgery. Your skin needs to be as free of germs as possible. You can reduce the number of germs on your skin by washing with CHG (chlorhexidine gluconate) soap before surgery. CHG is an antiseptic soap that kills germs and continues to kill germs even after washing.   DO NOT use if you have an allergy to chlorhexidine/CHG or antibacterial soaps. If your skin becomes reddened or irritated, stop using the CHG and notify one of our RNs at [phone].              TAKE A SHOWER THE NIGHT BEFORE SURGERY AND THE  DAY OF SURGERY    Please keep in mind the following:  DO NOT shave, including legs and underarms, 48 hours prior to surgery.   You may shave your face before/day of surgery.  Place clean sheets on your bed the night before surgery Use a clean washcloth (not used since being washed) for each shower. DO NOT sleep with pet's night before surgery.  CHG Shower Instructions:  Wash your face and private area with normal soap. If you choose to wash your hair, wash first with your normal shampoo.  After you use shampoo/soap, rinse your hair and body thoroughly to remove shampoo/soap residue.  Turn the water OFF and apply half the bottle of CHG soap to a CLEAN washcloth.  Apply CHG soap ONLY FROM YOUR NECK DOWN TO YOUR TOES (washing for 3-5 minutes)  DO NOT use CHG soap on face, private areas, open wounds, or sores.  Pay special attention to the area where your surgery is being performed.  If you are having back surgery, having someone wash your back for you may be helpful. Wait 2 minutes after CHG soap is applied, then you may rinse off the CHG soap.  Pat dry with a clean towel  Put on clean pajamas    Additional instructions for the day of surgery: DO NOT APPLY any lotions, deodorants, cologne, or perfumes.   Do not wear jewelry or makeup Do not wear nail polish, gel polish, artificial nails, or any other type of covering on natural nails (fingers and toes) Do not bring valuables to the hospital. [facility] is not responsible for valuables/personal belongings. Put on clean/comfortable clothes.  Please brush your teeth.  Ask your nurse before applying any prescription medications to the skin.

## 2023-12-24 NOTE — Progress Notes (Signed)
PCP - Dr. Lavada Mesi Cardiologist - Dr. Chilton Si - Last office visit 06/26/2022, but has seen Dr. Allyson Sabal in office since then for renal artery stenosis  PPM/ICD - Denies Device Orders - n/a Rep Notified - n/a  Chest x-ray - n/a EKG - 12/24/2023 Stress Test - Denies ECHO - 10/24/2021 Cardiac Cath - Denies  Sleep Study - +OSA. Pt wears CPAP nightly. Pt not aware of pressure settings.  Pt is Pre-DM (Gestational DM but resolved after delivery)  Last dose of GLP1 agonist- n/a GLP1 instructions: n/a  Blood Thinner Instructions: n/a Aspirin Instructions: n/a  ERAS Protcol - Clear liquids until 0530 morning of surgery PRE-SURGERY Ensure or G2- Ensure given to pt with instructions  COVID TEST- n/a   Anesthesia review: Yes. EKG review and cardiac clearance. Pt was in process of getting cardiac clearance Sept. 2024, but decided to postpone surgery so clearance was not completed. Discussed with Antionette Poles, PA-C and pt instructed to contact cardiology office and set up another clearance appointment prior to surgery. Pt understood instructions.   Patient denies shortness of breath, fever, cough and chest pain at PAT appointment. Pt denies any respiratory illness/infection in the last two months.   All instructions explained to the patient, with a verbal understanding of the material. Patient agrees to go over the instructions while at home for a better understanding. Patient also instructed to self quarantine after being tested for COVID-19. The opportunity to ask questions was provided.

## 2023-12-27 ENCOUNTER — Telehealth (INDEPENDENT_AMBULATORY_CARE_PROVIDER_SITE_OTHER): Payer: Managed Care, Other (non HMO) | Admitting: Family

## 2023-12-27 ENCOUNTER — Encounter (HOSPITAL_BASED_OUTPATIENT_CLINIC_OR_DEPARTMENT_OTHER): Payer: Self-pay | Admitting: Family

## 2023-12-27 VITALS — BP 161/73 | HR 62 | Ht 68.0 in | Wt 255.0 lb

## 2023-12-27 DIAGNOSIS — I773 Arterial fibromuscular dysplasia: Secondary | ICD-10-CM | POA: Diagnosis not present

## 2023-12-27 DIAGNOSIS — I480 Paroxysmal atrial fibrillation: Secondary | ICD-10-CM

## 2023-12-27 DIAGNOSIS — E782 Mixed hyperlipidemia: Secondary | ICD-10-CM

## 2023-12-27 DIAGNOSIS — I471 Supraventricular tachycardia, unspecified: Secondary | ICD-10-CM | POA: Diagnosis not present

## 2023-12-27 DIAGNOSIS — I701 Atherosclerosis of renal artery: Secondary | ICD-10-CM

## 2023-12-27 DIAGNOSIS — I10 Essential (primary) hypertension: Secondary | ICD-10-CM | POA: Diagnosis not present

## 2023-12-27 MED ORDER — CARVEDILOL 3.125 MG PO TABS: 6.2500 mg | ORAL_TABLET | Freq: Two times a day (BID) | ORAL | Status: DC

## 2023-12-27 NOTE — Patient Instructions (Signed)
 Medication Instructions:   CHANGE Carvedilol  to two 3.125mg  tablets (6.25mg  total dose) twice per day  START Hydralazine  10mg  twice per day (if BP >150)   Labwork: Your physician recommends that you return for lab work prior to your March visit at [facility] for AUTOMATIC DATA lipoprofile and LFT. Please fast prior to these labs.    Testing/Procedures: Your EKG from 12/24/23 showed normal sinus rhythm with poor R wave progression. This was likely related to lead placement and is not concerning.   Follow-Up: As scheduled in March   Special Instructions:   Reche GORMAN Finder, NP will check in via MyChart about BP in 1 week

## 2023-12-27 NOTE — Progress Notes (Signed)
 Virtual Visit via Video Note   Because of Misty Mahoney's co-morbid illnesses, she is at least at moderate risk for complications without adequate follow up.  This format is felt to be most appropriate for this patient at this time.  All issues noted in this document were discussed and addressed.  A limited physical exam was performed with this format.  Please refer to the patient's chart for her consent to telehealth for [facility].       Date:  12/27/2023   ID:  Misty Mahoney, DOB 1966/09/28, MRN [id number] The patient was identified using 2 identifiers.  Patient Location: Home Provider Location: Office/Clinic   PCP:  Hughie Sharper, MD   [facility] Providers Cardiologist:  Elspeth Sage, MD     Evaluation Performed:  Follow-Up Visit  Chief Complaint:  HTN  History of Present Illness:    Misty Mahoney is a 58 y.o. female with  with a hx of anemia, hypertension, GERD, diabetes, HLD, obesity, SVT s/p RCFA here to follow up in the [facility].    Prior echo 2018 LVEF 60-65%, gr2DD. Hypertension dates back to birth of her son >20 years ago. She was referred to [facility] after her BP remained difficult to control despite multiple agents. She has known adrenal adenoma but testing negative for hyperaldosterosim and pheochromocytoma. Spironolactone  added and Doxazosin  discontinued. Renal doppler >60% stenosis ont he right. Abdominal CT 10/2021 demonstrated fibromuscular dysplasia in right renal artery and left distal renal artery. Follows with Dr. Court.  She underwent right renal artery intervention 01/11/2023. Since last seen PCP has transition chlorthalidone  to triamterene -hydrochlorothiazide .   Presents today for follow-up.  Normally BP at home 140-150.  She notes taking her triamterene -HCTZ about every third day due to leg cramping despite taking magnesium.  She has been taking 6.25 mg of carvedilol  morning and 3.125 mg in  the evening.  Does note occasionally missing evening doses. Reports no shortness of breath nor dyspnea on exertion. Reports no chest pain, pressure, or tightness. No edema, orthopnea, PND. Reports no palpitations.  [place] doing [place] walking for exercise.   Previous antihypertensives: Lisinopril - Severe Cough Irbesartan  - Ineffective Spironolactone  - cramps   Past Medical History:  Diagnosis Date   Anemia    gestational   Arterial fibromuscular dysplasia (HCC) 11/29/2021   Atherosclerosis 06/26/2022   Atypical mole 07/24/2012   right abdomen moderate   Atypical mole 07/10/2018   right supra pubic moderate   Chicken pox as a child   Chronic kidney disease    Left Renal Artery Aneurysm   Depression 06/15/2015   DM, gestational, diet controlled 04/10/2014   insulin  resistant- A1C5.7    Dysrhythmia    A. Fib   GERD (gastroesophageal reflux disease)    undx'd GERD    Hyperlipidemia 12/20/2015   Hypertension    Neuromuscular disorder (HCC)    Fibromuscular dysplasia in Kidney   Obesity, unspecified 04/10/2014   OSA (obstructive sleep apnea)    uses cpap   Osteoarthritis    Paroxysmal atrial fibrillation (HCC)    Peripheral vascular disease (HCC)    Renal Artery Stenosis   Pre-diabetes    Prediabetes    SVT (supraventricular tachycardia) (HCC)    s/p RFCA 09/26/12   SVT (supraventricular tachycardia) (HCC)    a. s/p slow pathway modification 2013 Dr Waddell   Vitamin D  deficiency 12/26/2015   Past Surgical History:  Procedure  Laterality Date   ADENOIDECTOMY     BREAST LUMPECTOMY WITH RADIOACTIVE SEED LOCALIZATION Right 08/03/2020   Procedure: RIGHT BREAST EXCISIONAL BIOPSY WITH RADIOACTIVE SEED LOCALIZATION;  Surgeon: Ebbie Cough, MD;  Location: [facility];  Service: General;  Laterality: Right;   ENDOVENOUS ABLATION SAPHENOUS VEIN W/ LASER Bilateral    Thermal ablation   KNEE ARTHROSCOPY WITH MENISCAL REPAIR Left  08/25/2016   Procedure: KNEE ARTHROSCOPY WITH partial medial meniscectomy, chondroplasty;  Surgeon: Maude Herald, MD;  Location:  SURGERY CENTER;  Service: Orthopedics;  Laterality: Left;  KNEE ARTHROSCOPY WITH partial medial meniscectomy, chondroplasty   RENAL ANGIOGRAPHY N/A 01/11/2023   Procedure: RENAL ANGIOGRAPHY;  Surgeon: Court Dorn PARAS, MD;  Location: [facility];  Service: Cardiovascular;  Laterality: N/A;   RENAL INTERVENTION  01/11/2023   Procedure: RENAL INTERVENTION;  Surgeon: Court Dorn PARAS, MD;  Location: [facility];  Service: Cardiovascular;;   TONSILLECTOMY  58 yrs old   V-TACH ABLATION N/A 09/26/2012   Procedure: V-TACH ABLATION;  Surgeon: Danelle LELON Birmingham, MD;  Location: [facility] CATH LAB;  Service: Cardiovascular;  Laterality: N/A;   WISDOM TOOTH EXTRACTION  57 yrs old     Current Meds  Medication Sig   aspirin  EC 81 MG tablet Take 1 tablet (81 mg total) by mouth daily. Swallow whole.   carvedilol  (COREG ) 3.125 MG tablet Take 1 tablet by mouth 2 times daily.   Cholecalciferol (VITAMIN D ) 2000 units CAPS Take 2,000 Units by mouth daily.   cyanocobalamin 1000 MCG tablet Take 1,000 mcg by mouth daily.   diltiazem  (CARDIZEM  CD) 240 MG 24 hr capsule Take 1 capsule (240 mg total) by mouth daily.   escitalopram  (LEXAPRO ) 10 MG tablet Take 1 tablet by mouth daily.   flecainide  (TAMBOCOR ) 150 MG tablet Take 2 tablets (300 mg total) by mouth as needed.   hydrALAZINE  (APRESOLINE ) 10 MG tablet Take 1 tablet (10 mg total) by mouth as needed (as needed for systolic blood presure more than 180).   Magnesium 250 MG TABS Take 250 mg by mouth daily.   Multiple Vitamin (MULTIVITAMIN) tablet Take 1 tablet by mouth daily.   NON FORMULARY Pt uses a cpap nightly   pyridoxine (B-6) 100 MG tablet Take 100 mg by mouth daily.   triamterene -hydrochlorothiazide  (MAXZIDE-25) 37.5-25 MG tablet Take 0.5-1 tablets by mouth daily as needed (fluid).   valsartan  (DIOVAN ) 320 MG  tablet Take 1 tablet (320 mg total) by mouth daily.     Allergies:   Epinephrine , Lisinopril, and Sunflower oil   Social History   Tobacco Use   Smoking status: Never    Passive exposure: Never   Smokeless tobacco: Never  Vaping Use   Vaping status: Never Used  Substance Use Topics   Alcohol use: Yes    Comment: Rarely   Drug use: No     Family Hx: The patient's family history includes ADD / ADHD in her daughter and son; Alzheimer's disease in her father; Atrial fibrillation in her father and mother; Breast cancer (age of onset: 74) in her sister; Cancer in her maternal grandfather and paternal grandmother; Cancer (age of onset: 86) in her sister; Cataracts in her father; Colon cancer in her maternal grandmother; Colon polyps in her father; Dementia in her father and mother; Heart attack in her maternal grandfather; Hypertension in her brother, father, and mother; Stroke in her mother. There is no history of Esophageal cancer, Rectal cancer, or Stomach cancer.  ROS:   Please  see the history of present illness.     All other systems reviewed and are negative.   Prior CV studies:   The following studies were reviewed today:  Cardiac Studies & Procedures      ECHOCARDIOGRAM  ECHOCARDIOGRAM COMPLETE 10/24/2021  Narrative ECHOCARDIOGRAM REPORT    Patient Name:   AMBRIELLA KITT Date of Exam: 10/24/2021 Medical Rec #:  [id number]              Height:       68.5 in Accession #:    [id number]             Weight:       252.8 lb Date of Birth:  [birth year]              BSA:          2.270 m Patient Age:    55 years               BP:           113/74 mmHg Patient Gender: F                      HR:           61 bpm. Exam Location:  [address]  Procedure: 2D Echo, Cardiac Doppler, Color Doppler and Intracardiac Opacification Agent  Indications:    R06.00 Dyspnea  History:        Patient has prior history of Echocardiogram examinations, most recent 07/10/2017. Risk  Factors:Dyslipidemia, Hypertension and Pre-Diabetes. Obstructive sleep apnea. Paroxysmal atrial fibrillation.  Sonographer:    Carl Rodgers-Jones RDCS Referring Phys: [id number] JONATHAN J BERRY  IMPRESSIONS   1. Left ventricular ejection fraction, by estimation, is 60 to 65%. The left ventricle has normal function. The left ventricle has no regional wall motion abnormalities. Left ventricular diastolic parameters were normal. 2. Right ventricular systolic function is normal. The right ventricular size is normal. 3. Left atrial size was severely dilated. 4. The mitral valve is abnormal. Trivial mitral valve regurgitation. No evidence of mitral stenosis. 5. The aortic valve is tricuspid. Aortic valve regurgitation is not visualized. No aortic stenosis is present. 6. The inferior vena cava is normal in size with greater than 50% respiratory variability, suggesting right atrial pressure of 3 mmHg.  FINDINGS Left Ventricle: Left ventricular ejection fraction, by estimation, is 60 to 65%. The left ventricle has normal function. The left ventricle has no regional wall motion abnormalities. Definity  contrast agent was given IV to delineate the left ventricular endocardial borders. The left ventricular internal cavity size was normal in size. There is no left ventricular hypertrophy. Left ventricular diastolic parameters were normal.  Right Ventricle: The right ventricular size is normal. No increase in right ventricular wall thickness. Right ventricular systolic function is normal.  Left Atrium: Left atrial size was severely dilated.  Right Atrium: Right atrial size was normal in size.  Pericardium: There is no evidence of pericardial effusion.  Mitral Valve: The mitral valve is abnormal. There is mild thickening of the mitral valve leaflet(s). There is mild calcification of the mitral valve leaflet(s). Trivial mitral valve regurgitation. No evidence of mitral valve stenosis.  Tricuspid Valve:  The tricuspid valve is normal in structure. Tricuspid valve regurgitation is trivial. No evidence of tricuspid stenosis.  Aortic Valve: The aortic valve is tricuspid. Aortic valve regurgitation is not visualized. No aortic stenosis is present.  Pulmonic Valve: The pulmonic valve was normal in structure. Pulmonic valve regurgitation  is not visualized. No evidence of pulmonic stenosis.  Aorta: The aortic root is normal in size and structure.  Venous: The inferior vena cava is normal in size with greater than 50% respiratory variability, suggesting right atrial pressure of 3 mmHg.  IAS/Shunts: No atrial level shunt detected by color flow Doppler.   LEFT VENTRICLE PLAX 2D LVIDd:         5.60 cm   Diastology LVIDs:         3.60 cm   LV e' medial:    7.40 cm/s LV PW:         0.80 cm   LV E/e' medial:  9.2 LV IVS:        0.80 cm   LV e' lateral:   9.79 cm/s LVOT diam:     2.00 cm   LV E/e' lateral: 7.0 LV SV:         68 LV SV Index:   30 LVOT Area:     3.14 cm   RIGHT VENTRICLE             IVC RV Basal diam:  3.40 cm     IVC diam: 1.70 cm RV S prime:     20.70 cm/s TAPSE (M-mode): 2.6 cm  LEFT ATRIUM             Index        RIGHT ATRIUM           Index LA diam:        5.30 cm 2.33 cm/m   RA Area:     10.30 cm LA Vol (A2C):   71.0 ml 31.28 ml/m  RA Volume:   20.90 ml  9.21 ml/m LA Vol (A4C):   87.1 ml 38.37 ml/m LA Biplane Vol: 80.5 ml 35.46 ml/m AORTIC VALVE LVOT Vmax:   96.15 cm/s LVOT Vmean:  64.500 cm/s LVOT VTI:    0.217 m  AORTA Ao Root diam: 3.10 cm Ao Asc diam:  3.20 cm  MITRAL VALVE               TRICUSPID VALVE MV Area (PHT): 2.87 cm    TR Peak grad:   15.2 mmHg MV Decel Time: 264 msec    TR Vmax:        195.00 cm/s MV E velocity: 68.10 cm/s MV A velocity: 76.80 cm/s  SHUNTS MV E/A ratio:  0.89        Systemic VTI:  0.22 m Systemic Diam: 2.00 cm  Maude Emmer MD Electronically signed by Maude Emmer MD Signature Date/Time: 10/24/2021/10:43:01  AM    Final    CT SCANS  CT CARDIAC SCORING (SELF PAY ONLY) 11/06/2022  Addendum 11/08/2022  3:17 PM ADDENDUM REPORT: 11/08/2022 15:15  CLINICAL DATA:  Cardiovascular Disease Risk stratification  EXAM: Coronary Calcium Score  TECHNIQUE: A gated, non-contrast computed tomography scan of the heart was performed using 3mm slice thickness. Axial images were analyzed on a dedicated workstation. Calcium scoring of the coronary arteries was performed using the Agatston method.  FINDINGS: Coronary arteries: Normal origins.  Coronary Calcium Score:  Left main: 0  Left anterior descending artery: 0  Left circumflex artery: 0  Right coronary artery: 0  Total: 0  Percentile: 0  Pericardium: Normal.  Aorta: Normal caliber of ascending aorta. No aortic atherosclerosis noted.  Hiatal hernia noted.  Non-cardiac: See separate report from [facility].  IMPRESSION: Coronary calcium score of 0. This was 0 percentile for age-, race-, and sex-matched controls.  Hiatal hernia.  RECOMMENDATIONS: Coronary artery calcium (CAC) score is a strong predictor of incident coronary heart disease (CHD) and provides predictive information beyond traditional risk factors. CAC scoring is reasonable to use in the decision to withhold, postpone, or initiate statin therapy in intermediate-risk or selected borderline-risk asymptomatic adults (age 43-75 years and LDL-C >=70 to <190 mg/dL) who do not have diabetes or established atherosclerotic cardiovascular disease (ASCVD).* In intermediate-risk (10-year ASCVD risk >=7.5% to <20%) adults or selected borderline-risk (10-year ASCVD risk >=5% to <7.5%) adults in whom a CAC score is measured for the purpose of making a treatment decision the following recommendations have been made:  If CAC=0, it is reasonable to withhold statin therapy and reassess in 5 to 10 years, as long as higher risk conditions are absent (diabetes mellitus,  family history of premature CHD in first degree relatives (males <55 years; females <65 years), cigarette smoking, or LDL >=190 mg/dL).  If CAC is 1 to 99, it is reasonable to initiate statin therapy for patients >=36 years of age.  If CAC is >=100 or >=75th percentile, it is reasonable to initiate statin therapy at any age.  Cardiology referral should be considered for patients with CAC scores >=400 or >=75th percentile.  *2018 AHA/ACC/AACVPR/AAPA/ABC/ACPM/ADA/AGS/APhA/ASPC/NLA/PCNA Guideline on the Management of Blood Cholesterol: A Report of the American College of Cardiology/American Heart Association Task Force on Clinical Practice Guidelines. J Am Coll Cardiol. 2019;73(24):3168-3209.  Shelda Bruckner, MD   Electronically Signed By: Shelda Bruckner M.D. On: 11/08/2022 15:15  Narrative : OVER-READ INTERPRETATION  CT CHEST  The following report is an over-read performed by radiologist Dr. Isla Qua [facility] on 11/07/2022. This over-read does not include interpretation of cardiac or coronary anatomy or pathology. The coronary calcium interpretation by the cardiologist is attached. Imaging of the chest is focused on cardiac structures and excludes much of the chest on CT.  COMPARISON:  December 10, 2017  FINDINGS:  Cardiovascular: Please see dedicated report for cardiovascular details.  Mediastinum/Nodes: No adenopathy or acute process in the mediastinum.  Lungs/Pleura: No signs of consolidation or pleural effusion. Tiny RIGHT lower lobe pulmonary nodule stable since 2019 compatible with benign pulmonary nodule for which no additional dedicated imaging follow-up is recommended. Airways are patent to the extent evaluated.  Upper Abdomen: Incidental imaging of upper abdominal contents without acute process. Moderate size hiatal hernia similar to previous imaging.  Musculoskeletal: No acute bone finding. No destructive bone  process. Spinal degenerative changes.  IMPRESSION:  1. No acute extracardiac findings. 2. Moderate size hiatal hernia similar to previous imaging.  Electronically Signed: By: Isla Blind M.D. On: 11/07/2022 11:39   CT SCANS  CT CARDIAC SCORING (SELF PAY ONLY) 07/10/2017  Addendum 07/10/2017  9:45 AM ADDENDUM REPORT: 07/10/2017 09:42  CLINICAL DATA:  Risk stratification  EXAM: Coronary Calcium Score  TECHNIQUE: The patient was scanned on a Siemens Somatom 64 slice scanner. Axial non-contrast 3 mm slices were carried out through the heart. The data set was analyzed on a dedicated work station and scored using the Agatson method.  FINDINGS: Non-cardiac: See separate report from [facility].  Ascending Aorta:  3.3 cm  Pericardium: Normal  Coronary arteries:  No calcium detected  IMPRESSION: Coronary calcium score of  0.  Maude Emmer   Electronically Signed By: Maude Emmer M.D. On: 07/10/2017 09:42  Narrative EXAM: OVER-READ INTERPRETATION  CT CHEST  The following report is an over-read performed by radiologist Dr. Franky Leff [facility] on 07/10/2017. This  over-read does not include interpretation of cardiac or coronary anatomy or pathology. The coronary calcium score interpretation by the cardiologist is attached.  COMPARISON:  None.  FINDINGS: Cardiovascular: Heart is normal size. Visualized aorta normal caliber.  Mediastinum/Nodes: No adenopathy in the lower mediastinum or hila. Small hiatal hernia.  Lungs/Pleura: Nodular density noted in the medial right lower lobe, approximately 1.9 cm. This has an elongated appearance on coronal and sagittal reconstructed images and most likely reflects scarring, but warrants follow-up. Remainder the lungs are clear. No effusions.  Upper Abdomen: Imaging into the upper abdomen shows no acute findings.  Musculoskeletal: Chest wall soft tissues unremarkable. No acute  bony abnormality.  IMPRESSION: Nodular area in the medial right lower lobe, most likely scarring, but recommend follow-up CT in 6-12 months.  Electronically Signed: By: Franky Crease M.D. On: 07/10/2017 09:33           Labs/Other Tests and Data Reviewed:    EKG:  An ECG dated 12/24/2023 was personally reviewed today and demonstrated:  NSR with minimal voltage criteria for LVH and poor R wave progression.  No acute ST/T wave changes.  Recent Labs: 12/24/2023: BUN 18; Creatinine, Ser 0.79; Hemoglobin 11.2; Platelets 233; Potassium 3.8; Sodium 141   Recent Lipid Panel Lab Results  Component Value Date/Time   CHOL 175 07/17/2019 09:00 AM   CHOL 177 08/22/2018 10:15 AM   TRIG 93.0 07/17/2019 09:00 AM   HDL 57.90 07/17/2019 09:00 AM   HDL 55 08/22/2018 10:15 AM   CHOLHDL 3 07/17/2019 09:00 AM   LDLCALC 99 07/17/2019 09:00 AM   LDLCALC 100 (H) 08/22/2018 10:15 AM    Wt Readings from Last 3 Encounters:  12/27/23 255 lb (115.7 kg)  12/24/23 260 lb 14.4 oz (118.3 kg)  01/31/23 254 lb (115.2 kg)     Risk Assessment/Calculations:          Objective:    Vital Signs:  BP (!) 161/73 (BP Location: Left Arm, Patient Position: Sitting, Cuff Size: Large)   Pulse 62   Ht 5' 8 (1.727 m)   Wt 255 lb (115.7 kg)   LMP 06/24/2018   BMI 38.77 kg/m    VITAL SIGNS:  reviewed RESPIRATORY:  normal respiratory effort, symmetric expansion CARDIOVASCULAR:  no peripheral edema  ASSESSMENT & PLAN:    Arterial fibromuscular dysplasia / HTN - BP not at goal <130/80. Renal artery fibromuscular dysplasia s/p right renal artery intervention 12/2022 followed by Dr. Court.  Increase carvedilol  to 6.25 mg twice daily with careful monitoring of heart rate.  She will work to take triamterene -hydrochlorothiazide  more routinely.  Continue diltiazem  240mg  QD, Valsartan  320mg  QD.  She will utilize hydralazine  10 mg twice daily if BP greater than 150.  Consider increased dose at follow-up.  Follow up via  MyChart in 1 week. If    Atrial fibrillation - s/p RCFA.  No recent palpitations.  Managed by PRN Flecainide . Not on OAC.   Preop - Pending hernia surgery.  Exercise tolerance given 4 METS. Per AHA/ACC guidelines, she is deemed acceptable risk for the planned procedure without additional cardiovascular testing.  Per office protocol may hold aspirin  5 to 7 days prior to procedure.   Atherosclerosis - Known mild atherosclerosis of cerebral vascular. Ideally LDL goal <70.  Update NMR and LFT prior to next office visit   SVT - Stable on diltiazem  and carvedilol .  Recurrent palpitations            Time:   Today, I have spent 16  minutes with the patient with telehealth technology discussing the above problems.     Medication Adjustments/Labs and Tests Ordered: Current medicines are reviewed at length with the patient today.  Concerns regarding medicines are outlined above.   Tests Ordered: No orders of the defined types were placed in this encounter.   Medication Changes: No orders of the defined types were placed in this encounter.   Follow Up:  In Person  March 2025 as scheduled  Signed, Reche GORMAN Finder, NP  12/27/2023 2:57 PM    [facility]

## 2023-12-28 NOTE — Progress Notes (Signed)
 Anesthesia Chart Review:  58 year old female follows with cardiology for history of difficult to control HTN, OSA on CPAP, SVT s/p RCFA, fibromuscular dysplasia of renal arteries.  Coronary calcium score of 0 on CT 10/2022.  Echo 10/2021 showed EF 60 to 65%, normal RV function, severely dilated left atrium, significant valvular abnormalities.  She was seen by Reche Finder, NP for preop evaluation.  At that time her carvedilol  was increased to 6.25 mg twice daily for better control of hypertension.  She was continued on diltiazem  24 mg daily, valsartan  320 mg daily, triamterene  HCTZ 37.5-25 daily, hydralazine  10 mg twice daily if BP greater than 150.  Regarding upcoming surgery, Pending hernia surgery.  Exercise tolerance given 4 METS. Per AHA/ACC guidelines, she is deemed acceptable risk for the planned procedure without additional cardiovascular testing.  Per office protocol may hold aspirin  5 to 7 days prior to procedure.  Other pertinent history includes GERD, hiatal hernia.  Preop labs reviewed, mild anemia with hemoglobin 11.2, otherwise unremarkable.  EKG 12/24/2023: Normal sinus rhythm.  Rate 63. Minimal voltage criteria for LVH, may be normal variant ( R in aVL ). Poor R wave progression  TTE 10/24/2021: 1. Left ventricular ejection fraction, by estimation, is 60 to 65%. The  left ventricle has normal function. The left ventricle has no regional  wall motion abnormalities. Left ventricular diastolic parameters were  normal.   2. Right ventricular systolic function is normal. The right ventricular  size is normal.   3. Left atrial size was severely dilated.   4. The mitral valve is abnormal. Trivial mitral valve regurgitation. No  evidence of mitral stenosis.   5. The aortic valve is tricuspid. Aortic valve regurgitation is not  visualized. No aortic stenosis is present.   6. The inferior vena cava is normal in size with greater than 50%  respiratory variability, suggesting right  atrial pressure of 3 mmHg.     Lynwood Geofm RIGGERS [facility] Short Stay Center/Anesthesiology Phone [phone] 12/28/2023 3:09 PM

## 2024-01-03 ENCOUNTER — Encounter (HOSPITAL_BASED_OUTPATIENT_CLINIC_OR_DEPARTMENT_OTHER): Payer: Self-pay

## 2024-01-03 NOTE — Telephone Encounter (Signed)
Updated Bp as requested

## 2024-01-15 NOTE — H&P (Signed)
 58 y.o. female who is seen today for high risk. She is here for follow-up for high risk and a family history of breast cancer. She does have a sister who passed away from inflammatory cancer in 2017 as well as a paternal great grandfather with breast cancer. She has a normal MRI in October and a normal mammogram in February of this year. She has c density breast. She has otherwise remained healthy. She has no mass or dc She has had negative genetic testing previously. Her calculated lifetime risk of breast cancer by Tyrer-Cuzick version 8 is 30%. Her 5-year Dondra Spry risk is 3.6%. She has an umbilical hernia. She was in a outside ER for this when he got incarcerated when she was moving. She had a CT scan that she reports me showed a fat-containing umbilical hernia. This eventually reduced. She wants to discuss repair today.  Review of Systems: A complete review of systems was obtained from the patient. I have reviewed this information and discussed as appropriate with the patient. See HPI as well for other ROS.  Review of Systems  All other systems reviewed and are negative.  Medical History: Past Medical History:  Diagnosis Date  Hypertension  Sleep apnea   Past Surgical History:  Procedure Laterality Date  right breast biopsy Right  papilloma   Allergies  Allergen Reactions  Epinephrine Unknown  Hx of a fib  Lisinopril Cough  Sunflower Oil Swelling  Seeds   Current Outpatient Medications on File Prior to Visit  Medication Sig Dispense Refill  carvediloL (COREG) 3.125 MG tablet Take 1 tablet by mouth with food 2 times per day  cholecalciferol (VITAMIN D3) 2,000 unit capsule Take 1 capsule by mouth once daily  flecainide (TAMBOCOR) 150 MG tablet Take by mouth  valsartan (DIOVAN) 320 MG tablet  metFORMIN (GLUCOPHAGE-XR) 500 MG XR tablet   Family History  Problem Relation Age of Onset  Skin cancer Mother  High blood pressure (Hypertension) Mother  High blood pressure  (Hypertension) Father  Breast cancer Sister   Social History   Tobacco Use  Smoking Status Never  Smokeless Tobacco Never  Marital status: Life Partner  Tobacco Use  Smoking status: Never  Smokeless tobacco: Never  Vaping Use  Vaping status: Never Used  Substance and Sexual Activity  Alcohol use: Never  Drug use: Never   Objective:   Vitals:  10/25/23 0930  PainSc: 0-No pain   Physical Exam Vitals reviewed.  Constitutional:  Appearance: Normal appearance.  Chest:  Breasts: Right: No inverted nipple, mass or nipple discharge.  Left: No inverted nipple, mass or nipple discharge.  Abdominal:  Tenderness: There is no abdominal tenderness.  Hernia: A hernia is present. Hernia is present in the umbilical area.  Comments: Likely < 2 cm uh  Lymphadenopathy:  Upper Body:  Right upper body: No supraclavicular or axillary adenopathy.  Left upper body: No supraclavicular or axillary adenopathy.  Neurological:  Mental Status: She is alert.    Assessment and Plan:   UHR possible mesh, possible laparoscopy  She has no clinical or radiologic evidence of breast cancer right now. Her mammogram was reported as B density at this point. I think her risk is certainly less at this point also. After reviewing all the options for screening I think with B density breast tissue on mammography it would be reasonable to start omitting an MRI. There really is no benefit to an MRI without dense breast tissue and she does not need every 28-month follow-up. I do  not think she needs contrast mammogram either and I would be fine just proceeding with a regular routine mammogram and continuing her exams. We discussed chemoprevention again today. We elected again not to proceed with that.  She does have a small primary umbilical hernia. We discussed repair. This is already been incarcerated requiring an emergency room visit once. I think that is just going to continue or get worse over time. I discussed a  primary umbilical hernia repair if it is less than 1.5 to 2 cm. Also discussed the usage of mesh and the laparoscope and visit is larger than that. We discussed the surgery, risks as well as recovery and we will proceed to doing that after the new year.

## 2024-01-16 ENCOUNTER — Ambulatory Visit (HOSPITAL_COMMUNITY): Payer: Managed Care, Other (non HMO) | Admitting: Physician Assistant

## 2024-01-16 ENCOUNTER — Encounter (HOSPITAL_COMMUNITY)
Admission: RE | Disposition: A | Discharge status: Home / Self Care | Payer: Self-pay | Source: Home / Self Care | Attending: General Surgery

## 2024-01-16 ENCOUNTER — Other Ambulatory Visit: Payer: Self-pay

## 2024-01-16 ENCOUNTER — Ambulatory Visit (HOSPITAL_COMMUNITY)
Admission: RE | Admit: 2024-01-16 | Discharge: 2024-01-16 | Disposition: A | Discharge status: Home / Self Care | Payer: Managed Care, Other (non HMO) | Attending: General Surgery | Admitting: General Surgery

## 2024-01-16 ENCOUNTER — Encounter (HOSPITAL_COMMUNITY): Payer: Self-pay | Admitting: General Surgery

## 2024-01-16 DIAGNOSIS — G4730 Sleep apnea, unspecified: Secondary | ICD-10-CM | POA: Diagnosis not present

## 2024-01-16 DIAGNOSIS — K420 Umbilical hernia with obstruction, without gangrene: Secondary | ICD-10-CM | POA: Diagnosis not present

## 2024-01-16 DIAGNOSIS — I10 Essential (primary) hypertension: Secondary | ICD-10-CM | POA: Diagnosis not present

## 2024-01-16 DIAGNOSIS — K219 Gastro-esophageal reflux disease without esophagitis: Secondary | ICD-10-CM | POA: Diagnosis not present

## 2024-01-16 DIAGNOSIS — E785 Hyperlipidemia, unspecified: Secondary | ICD-10-CM | POA: Diagnosis not present

## 2024-01-16 DIAGNOSIS — R9230 Dense breasts, unspecified: Secondary | ICD-10-CM | POA: Diagnosis not present

## 2024-01-16 DIAGNOSIS — I4891 Unspecified atrial fibrillation: Secondary | ICD-10-CM | POA: Diagnosis not present

## 2024-01-16 DIAGNOSIS — K429 Umbilical hernia without obstruction or gangrene: Secondary | ICD-10-CM | POA: Diagnosis present

## 2024-01-16 DIAGNOSIS — Z79899 Other long term (current) drug therapy: Secondary | ICD-10-CM | POA: Insufficient documentation

## 2024-01-16 DIAGNOSIS — E1151 Type 2 diabetes mellitus with diabetic peripheral angiopathy without gangrene: Secondary | ICD-10-CM | POA: Insufficient documentation

## 2024-01-16 DIAGNOSIS — K449 Diaphragmatic hernia without obstruction or gangrene: Secondary | ICD-10-CM | POA: Insufficient documentation

## 2024-01-16 DIAGNOSIS — G4733 Obstructive sleep apnea (adult) (pediatric): Secondary | ICD-10-CM

## 2024-01-16 HISTORY — PX: UMBILICAL HERNIA REPAIR: SHX196

## 2024-01-16 HISTORY — PX: INSERTION OF MESH: SHX5868

## 2024-01-16 SURGERY — REPAIR, HERNIA, UMBILICAL, ADULT
Anesthesia: General | Site: Abdomen

## 2024-01-16 MED ORDER — DEXAMETHASONE SODIUM PHOSPHATE 10 MG/ML IJ SOLN
INTRAMUSCULAR | Status: DC | PRN
  Administered 2024-01-16: 5 mg via INTRAVENOUS

## 2024-01-16 MED ORDER — HYDROMORPHONE HCL 1 MG/ML IJ SOLN
INTRAMUSCULAR | Status: AC
  Filled 2024-01-16: qty 1

## 2024-01-16 MED ORDER — OXYCODONE HCL 5 MG PO TABS: 5.0000 mg | ORAL_TABLET | Freq: Four times a day (QID) | ORAL | 0 refills | Status: DC | PRN

## 2024-01-16 MED ORDER — SUGAMMADEX SODIUM 200 MG/2ML IV SOLN
INTRAVENOUS | Status: DC | PRN
Start: 2024-01-16 — End: 2024-01-16
  Administered 2024-01-16: 50 mg via INTRAVENOUS
  Administered 2024-01-16: 150 mg via INTRAVENOUS

## 2024-01-16 MED ORDER — BUPIVACAINE-EPINEPHRINE 0.25% -1:200000 IJ SOLN
INTRAMUSCULAR | Status: DC | PRN
  Administered 2024-01-16: 2 mL

## 2024-01-16 MED ORDER — SODIUM CHLORIDE 0.9% FLUSH: 3.0000 mL | INTRAVENOUS | Status: DC | PRN

## 2024-01-16 MED ORDER — PHENYLEPHRINE 80 MCG/ML (10ML) SYRINGE FOR IV PUSH (FOR BLOOD PRESSURE SUPPORT)
PREFILLED_SYRINGE | INTRAVENOUS | Status: AC
  Filled 2024-01-16: qty 10

## 2024-01-16 MED ORDER — FENTANYL CITRATE (PF) 100 MCG/2ML IJ SOLN
INTRAMUSCULAR | Status: AC
  Administered 2024-01-16: 100 ug via INTRAVENOUS
  Filled 2024-01-16: qty 2

## 2024-01-16 MED ORDER — SODIUM CHLORIDE 0.9% FLUSH: 3.0000 mL | Freq: Two times a day (BID) | INTRAVENOUS | Status: DC

## 2024-01-16 MED ORDER — AMISULPRIDE (ANTIEMETIC) 5 MG/2ML IV SOLN: 10.0000 mg | Freq: Once | INTRAVENOUS | Status: DC | PRN

## 2024-01-16 MED ORDER — ACETAMINOPHEN 500 MG PO TABS
1000.0000 mg | ORAL_TABLET | ORAL | Status: AC
  Administered 2024-01-16: 1000 mg via ORAL
  Filled 2024-01-16: qty 2

## 2024-01-16 MED ORDER — MIDAZOLAM HCL 2 MG/2ML IJ SOLN
INTRAMUSCULAR | Status: AC
  Filled 2024-01-16: qty 2

## 2024-01-16 MED ORDER — CHLORHEXIDINE GLUCONATE CLOTH 2 % EX PADS: 6.0000 | MEDICATED_PAD | Freq: Once | CUTANEOUS | Status: DC

## 2024-01-16 MED ORDER — FENTANYL CITRATE (PF) 250 MCG/5ML IJ SOLN
INTRAMUSCULAR | Status: AC
  Filled 2024-01-16: qty 5

## 2024-01-16 MED ORDER — CEFAZOLIN SODIUM-DEXTROSE 2-4 GM/100ML-% IV SOLN
2.0000 g | INTRAVENOUS | Status: AC
  Administered 2024-01-16: 2 g via INTRAVENOUS
  Filled 2024-01-16: qty 100

## 2024-01-16 MED ORDER — OXYCODONE HCL 5 MG/5ML PO SOLN
ORAL | Status: AC
  Filled 2024-01-16: qty 5

## 2024-01-16 MED ORDER — EPHEDRINE 5 MG/ML INJ
INTRAVENOUS | Status: AC
  Filled 2024-01-16: qty 10

## 2024-01-16 MED ORDER — OXYCODONE HCL 5 MG/5ML PO SOLN
5.0000 mg | Freq: Once | ORAL | Status: AC | PRN
  Administered 2024-01-16: 5 mg via ORAL

## 2024-01-16 MED ORDER — ONDANSETRON HCL 4 MG/2ML IJ SOLN
INTRAMUSCULAR | Status: AC
  Filled 2024-01-16: qty 2

## 2024-01-16 MED ORDER — ACETAMINOPHEN 650 MG RE SUPP: 650.0000 mg | RECTAL | Status: DC | PRN

## 2024-01-16 MED ORDER — ROCURONIUM BROMIDE 10 MG/ML (PF) SYRINGE
PREFILLED_SYRINGE | INTRAVENOUS | Status: AC
  Filled 2024-01-16: qty 10

## 2024-01-16 MED ORDER — MIDAZOLAM HCL 2 MG/2ML IJ SOLN
2.0000 mg | Freq: Once | INTRAMUSCULAR | Status: AC
Start: 2024-01-16 — End: 2024-01-16

## 2024-01-16 MED ORDER — LIDOCAINE 2% (20 MG/ML) 5 ML SYRINGE
INTRAMUSCULAR | Status: AC
  Filled 2024-01-16: qty 10

## 2024-01-16 MED ORDER — PHENYLEPHRINE 80 MCG/ML (10ML) SYRINGE FOR IV PUSH (FOR BLOOD PRESSURE SUPPORT)
PREFILLED_SYRINGE | INTRAVENOUS | Status: DC | PRN
  Administered 2024-01-16: 25 ug via INTRAVENOUS

## 2024-01-16 MED ORDER — LIDOCAINE 2% (20 MG/ML) 5 ML SYRINGE
INTRAMUSCULAR | Status: DC | PRN
  Administered 2024-01-16: 100 mg via INTRAVENOUS

## 2024-01-16 MED ORDER — MIDAZOLAM HCL 2 MG/2ML IJ SOLN
INTRAMUSCULAR | Status: AC
Start: 2024-01-16 — End: 2024-01-16
  Administered 2024-01-16: 2 mg via INTRAVENOUS
  Filled 2024-01-16: qty 2

## 2024-01-16 MED ORDER — SODIUM CHLORIDE 0.9 % IV SOLN: 250.0000 mL | INTRAVENOUS | Status: DC | PRN

## 2024-01-16 MED ORDER — LACTATED RINGERS IV SOLN: INTRAVENOUS | Status: DC

## 2024-01-16 MED ORDER — ORAL CARE MOUTH RINSE: 15.0000 mL | Freq: Once | OROMUCOSAL | Status: AC

## 2024-01-16 MED ORDER — ROCURONIUM BROMIDE 10 MG/ML (PF) SYRINGE
PREFILLED_SYRINGE | INTRAVENOUS | Status: DC | PRN
  Administered 2024-01-16: 60 mg via INTRAVENOUS

## 2024-01-16 MED ORDER — ACETAMINOPHEN 325 MG PO TABS: 650.0000 mg | ORAL_TABLET | ORAL | Status: DC | PRN

## 2024-01-16 MED ORDER — ROPIVACAINE HCL 5 MG/ML IJ SOLN
INTRAMUSCULAR | Status: DC | PRN
  Administered 2024-01-16: 40 mL via PERINEURAL

## 2024-01-16 MED ORDER — CHLORHEXIDINE GLUCONATE 0.12 % MT SOLN
15.0000 mL | Freq: Once | OROMUCOSAL | Status: AC
  Administered 2024-01-16: 15 mL via OROMUCOSAL
  Filled 2024-01-16: qty 15

## 2024-01-16 MED ORDER — ONDANSETRON HCL 4 MG/2ML IJ SOLN
INTRAMUSCULAR | Status: DC | PRN
  Administered 2024-01-16: 4 mg via INTRAVENOUS

## 2024-01-16 MED ORDER — ENSURE PRE-SURGERY PO LIQD: 296.0000 mL | Freq: Once | ORAL | Status: DC

## 2024-01-16 MED ORDER — FENTANYL CITRATE (PF) 100 MCG/2ML IJ SOLN: 100.0000 ug | Freq: Once | INTRAMUSCULAR | Status: AC

## 2024-01-16 MED ORDER — 0.9 % SODIUM CHLORIDE (POUR BTL) OPTIME
TOPICAL | Status: DC | PRN
  Administered 2024-01-16: 1000 mL

## 2024-01-16 MED ORDER — BUPIVACAINE-EPINEPHRINE (PF) 0.25% -1:200000 IJ SOLN
INTRAMUSCULAR | Status: AC
  Filled 2024-01-16: qty 60

## 2024-01-16 MED ORDER — OXYCODONE HCL 5 MG PO TABS: 5.0000 mg | ORAL_TABLET | Freq: Once | ORAL | Status: AC | PRN

## 2024-01-16 MED ORDER — SODIUM CHLORIDE 0.9 % IV SOLN: 12.5000 mg | INTRAVENOUS | Status: DC | PRN

## 2024-01-16 MED ORDER — OXYCODONE HCL 5 MG PO TABS: 5.0000 mg | ORAL_TABLET | ORAL | Status: DC | PRN

## 2024-01-16 MED ORDER — SUGAMMADEX SODIUM 200 MG/2ML IV SOLN
INTRAVENOUS | Status: AC
  Filled 2024-01-16: qty 2

## 2024-01-16 MED ORDER — PROPOFOL 10 MG/ML IV BOLUS
INTRAVENOUS | Status: DC | PRN
  Administered 2024-01-16: 150 mg via INTRAVENOUS

## 2024-01-16 MED ORDER — DEXAMETHASONE SODIUM PHOSPHATE 10 MG/ML IJ SOLN
INTRAMUSCULAR | Status: AC
  Filled 2024-01-16: qty 1

## 2024-01-16 MED ORDER — EPHEDRINE SULFATE-NACL 50-0.9 MG/10ML-% IV SOSY
PREFILLED_SYRINGE | INTRAVENOUS | Status: DC | PRN
  Administered 2024-01-16 (×3): 5 mg via INTRAVENOUS
  Administered 2024-01-16: 10 mg via INTRAVENOUS

## 2024-01-16 MED ORDER — HEPARIN SODIUM (PORCINE) 5000 UNIT/ML IJ SOLN
5000.0000 [IU] | Freq: Once | INTRAMUSCULAR | Status: AC
Start: 2024-01-16 — End: 2024-01-16
  Administered 2024-01-16: 5000 [IU] via SUBCUTANEOUS
  Filled 2024-01-16: qty 1

## 2024-01-16 MED ORDER — HYDROMORPHONE HCL 1 MG/ML IJ SOLN
0.2500 mg | INTRAMUSCULAR | Status: DC | PRN
  Administered 2024-01-16: 0.25 mg via INTRAVENOUS

## 2024-01-16 MED ORDER — PROPOFOL 10 MG/ML IV BOLUS
INTRAVENOUS | Status: AC
  Filled 2024-01-16: qty 20

## 2024-01-16 SURGICAL SUPPLY — 47 items
APPLIER CLIP 5 13 M/L LIGAMAX5 (MISCELLANEOUS) IMPLANT
BAG COUNTER SPONGE SURGICOUNT (BAG) ×4 IMPLANT
BLADE CLIPPER SURG (BLADE) IMPLANT
CANISTER SUCT 3000ML PPV (MISCELLANEOUS) IMPLANT
CHLORAPREP W/TINT 26 (MISCELLANEOUS) ×4 IMPLANT
CLIP APPLIE 5 13 M/L LIGAMAX5 (MISCELLANEOUS) IMPLANT
COVER SURGICAL LIGHT HANDLE (MISCELLANEOUS) ×4 IMPLANT
DERMABOND ADVANCED .7 DNX12 (GAUZE/BANDAGES/DRESSINGS) ×4 IMPLANT
DRAPE LAPAROSCOPIC ABDOMINAL (DRAPES) ×4 IMPLANT
ELECT CAUTERY BLADE 6.4 (BLADE) ×4 IMPLANT
ELECT REM PT RETURN 9FT ADLT (ELECTROSURGICAL) ×3 IMPLANT
ELECTRODE REM PT RTRN 9FT ADLT (ELECTROSURGICAL) ×3 IMPLANT
GAUZE 4X4 16PLY ~~LOC~~+RFID DBL (SPONGE) ×4 IMPLANT
GLOVE BIO SURGEON STRL SZ7 (GLOVE) ×4 IMPLANT
GLOVE BIOGEL PI IND STRL 7.5 (GLOVE) ×4 IMPLANT
GOWN STRL REUS W/ TWL LRG LVL3 (GOWN DISPOSABLE) ×12 IMPLANT
IRRIG SUCT STRYKERFLOW 2 WTIP (MISCELLANEOUS) IMPLANT
IRRIGATION SUCT STRKRFLW 2 WTP (MISCELLANEOUS) IMPLANT
KIT BASIN OR (CUSTOM PROCEDURE TRAY) ×4 IMPLANT
KIT TURNOVER KIT B (KITS) ×4 IMPLANT
MESH VENT ST 22.1X27.1CM OVL (Mesh General) ×1 IMPLANT
NDL HYPO 25GX1X1/2 BEV (NEEDLE) ×3 IMPLANT
NEEDLE HYPO 25GX1X1/2 BEV (NEEDLE) ×3 IMPLANT
NS IRRIG 1000ML POUR BTL (IV SOLUTION) ×4 IMPLANT
PACK GENERAL/GYN (CUSTOM PROCEDURE TRAY) ×4 IMPLANT
PAD ARMBOARD 7.5X6 YLW CONV (MISCELLANEOUS) ×4 IMPLANT
PENCIL SMOKE EVACUATOR (MISCELLANEOUS) ×3 IMPLANT
SCISSORS LAP 5X35 DISP (ENDOMECHANICALS) IMPLANT
SET TUBE SMOKE EVAC HIGH FLOW (TUBING) ×3 IMPLANT
SLEEVE Z-THREAD 5X100MM (TROCAR) ×4 IMPLANT
SPIKE FLUID TRANSFER (MISCELLANEOUS) ×4 IMPLANT
STRIP CLOSURE SKIN 1/2X4 (GAUZE/BANDAGES/DRESSINGS) ×4 IMPLANT
SUT ETHIBOND 0 MO6 C/R (SUTURE) ×2 IMPLANT
SUT MNCRL AB 4-0 PS2 18 (SUTURE) ×4 IMPLANT
SUT PROLENE 0 CT 1 CR/8 (SUTURE) IMPLANT
SUT VIC AB 2-0 CT1 TAPERPNT 27 (SUTURE) ×3 IMPLANT
SUT VIC AB 3-0 SH 27X BRD (SUTURE) ×5 IMPLANT
SUT VICRYL AB 2 0 TIES (SUTURE) ×3 IMPLANT
SYR CONTROL 10ML LL (SYRINGE) ×4 IMPLANT
TOWEL GREEN STERILE (TOWEL DISPOSABLE) ×4 IMPLANT
TOWEL GREEN STERILE FF (TOWEL DISPOSABLE) ×4 IMPLANT
TRAY FOLEY MTR SLVR 14FR STAT (SET/KITS/TRAYS/PACK) IMPLANT
TRAY LAPAROSCOPIC MC (CUSTOM PROCEDURE TRAY) ×3 IMPLANT
TROCAR 11X100 Z THREAD (TROCAR) IMPLANT
TROCAR BALLN 12MMX100 BLUNT (TROCAR) IMPLANT
TROCAR Z-THREAD OPTICAL 5X100M (TROCAR) ×4 IMPLANT
WARMER LAPAROSCOPE (MISCELLANEOUS) ×4 IMPLANT

## 2024-01-16 NOTE — Interval H&P Note (Signed)
 History and Physical Interval Note:  01/16/2024 7:38 AM  Misty Mahoney  has presented today for surgery, with the diagnosis of UMBILICAL HERNIA.  The various methods of treatment have been discussed with the patient and family. After consideration of risks, benefits and other options for treatment, the patient has consented to  Procedure(s): OPEN UMBILICAL HERNIA REPAIR WITH POSSIBLE MESH (N/A) POSSIBLE LAPAROSCOPY DIAGNOSTIC (N/A) as a surgical intervention.  The patient's history has been reviewed, patient examined, no change in status, stable for surgery.  I have reviewed the patient's chart and labs.  Questions were answered to the patient's satisfaction.     [name]

## 2024-01-16 NOTE — Anesthesia Procedure Notes (Signed)
 Procedure Name: Intubation Date/Time: 01/16/2024 8:33 AM  Performed by: Randon Goldsmith, CRNAPre-anesthesia Checklist: Patient identified, Emergency Drugs available, Suction available and Patient being monitored Patient Re-evaluated:Patient Re-evaluated prior to induction Oxygen Delivery Method: Circle system utilized Preoxygenation: Pre-oxygenation with 100% oxygen Induction Type: IV induction and Cricoid Pressure applied Ventilation: Mask ventilation without difficulty Laryngoscope Size: Mac and 3 Grade View: Grade I Tube type: Oral Tube size: 7.0 mm Number of attempts: 1 Airway Equipment and Method: Stylet and Oral airway Placement Confirmation: ETT inserted through vocal cords under direct vision, positive ETCO2 and breath sounds checked- equal and bilateral Secured at: 23 cm Tube secured with: Tape Dental Injury: Teeth and Oropharynx as per pre-operative assessment

## 2024-01-16 NOTE — Discharge Instructions (Signed)
 [facility]  UMBILICAL OR INGUINAL HERNIA REPAIR: POST OP INSTRUCTIONS  Always review your discharge instruction sheet given to you by the facility where your surgery was performed. IF YOU HAVE DISABILITY OR FAMILY LEAVE FORMS, YOU MUST BRING THEM TO THE OFFICE FOR PROCESSING.   DO NOT GIVE THEM TO YOUR DOCTOR.  A  prescription for pain medication may be given to you upon discharge.  Take your pain medication as prescribed, if needed.  If narcotic pain medicine is not needed, then you may take acetaminophen (Tylenol), naprosyn (Alleve) or ibuprofen (Advil) as needed. Take your usually prescribed medications unless otherwise directed. If you need a refill on your pain medication, please contact your pharmacy.  They will contact our office to request authorization. Prescriptions will not be filled after 5 pm or on week-ends. You should follow a light diet the first 24 hours after arrival home, such as soup and crackers, etc.  Be sure to include lots of fluids daily.  Resume your normal diet the day after surgery. Most patients will experience some swelling and bruising around the umbilicus or in the groin and scrotum.  Ice packs and reclining will help.  Swelling and bruising can take several days to resolve.  It is common to experience some constipation if taking pain medication after surgery.  Increasing fluid intake and taking a stool softener (such as Colace) will usually help or prevent this problem from occurring.  A mild laxative (Milk of Magnesia or Miralax) should be taken according to package directions if there are no bowel movements after 48 hours. Unless discharge instructions indicate otherwise, you may remove your bandages 48 hours after surgery, and you may shower at that time.  You may have steri-strips (small skin tapes) in place directly over the incision.  These strips should be left on the skin for 7-10 days and will come off on their own.  If your surgeon used  skin glue on the incision, you may shower in 24 hours.  The glue will flake off over the next 2-3 weeks.  Any sutures or staples will be removed at the office during your follow-up visit. ACTIVITIES:  You may resume regular (light) daily activities beginning the next day--such as daily self-care, walking, climbing stairs--gradually increasing activities as tolerated.  You may have sexual intercourse when it is comfortable.  Refrain from any heavy lifting or straining until approved by your doctor. You may drive when you are no longer taking prescription pain medication, you can comfortably wear a seatbelt, and you can safely maneuver your car and apply brakes. RETURN TO WORK:  __________________________________________________________ Misty Mahoney should see your doctor in the office for a follow-up appointment approximately 2-3 weeks after your surgery.  Make sure that you call for this appointment within a day or two after you arrive home to insure a convenient appointment time. OTHER INSTRUCTIONS:  __________________________________________________________________________________________________________________________________________________________________________________________  WHEN TO CALL YOUR DOCTOR: Fever over 101.0 Inability to urinate Nausea and/or vomiting Extreme swelling or bruising Continued bleeding from incision. Increased pain, redness, or drainage from the incision  The clinic staff is available to answer your questions during regular business hours.  Please don't hesitate to call and ask to speak to one of the nurses for clinical concerns.  If you have a medical emergency, go to the nearest emergency room or call 911.  A surgeon from [facility] is always on call at the hospital   [address], [address], [place], [place]  [address] ?  [address] [address], [place], [place]   [address] [phone] ? [phone] ? FAX [phone] Web site:  [email or web address]

## 2024-01-16 NOTE — Anesthesia Procedure Notes (Signed)
 Anesthesia Regional Block: TAP block   Pre-Anesthetic Checklist: , timeout performed,  Correct Patient, Correct Site, Correct Laterality,  Correct Procedure, Correct Position, site marked,  Risks and benefits discussed,  Surgical consent,  Pre-op evaluation,  At surgeon's request and post-op pain management  Laterality: Left and Right  Prep: chloraprep       Needles:  Injection technique: Single-shot  Needle Type: Stimiplex     Needle Length: 9cm  Needle Gauge: 21     Additional Needles:   Procedures:,,,, ultrasound used (permanent image in chart),,    Narrative:  Start time: 01/16/2024 8:17 AM End time: 01/16/2024 8:22 AM Injection made incrementally with aspirations every 5 mL.  Performed by: Personally  Anesthesiologist: Lowella Curb, MD

## 2024-01-16 NOTE — Anesthesia Postprocedure Evaluation (Signed)
 Anesthesia Post Note  Patient: Misty Mahoney  Procedure(s) Performed: OPEN UMBILICAL HERNIA REPAIR WITH MESH (Abdomen) INSERTION OF MESH (Abdomen)     Patient location during evaluation: PACU Anesthesia Type: General Level of consciousness: awake and alert Pain management: pain level controlled Vital Signs Assessment: post-procedure vital signs reviewed and stable Respiratory status: spontaneous breathing, nonlabored ventilation and respiratory function stable Cardiovascular status: blood pressure returned to baseline and stable Postop Assessment: no apparent nausea or vomiting Anesthetic complications: no   No notable events documented.  Last Vitals:  Vitals:   01/16/24 1029 01/16/24 1030  BP: (!) 141/67 (!) 141/67  Pulse: (!) 58   Resp: 18   Temp:    SpO2: 94%     Last Pain:  Vitals:   01/16/24 1000  TempSrc:   PainSc: 5                  Lowella Curb

## 2024-01-16 NOTE — Transfer of Care (Signed)
 Immediate Anesthesia Transfer of Care Note  Patient: Misty Mahoney  Procedure(s) Performed: OPEN UMBILICAL HERNIA REPAIR WITH MESH (Abdomen) INSERTION OF MESH (Abdomen)  Patient Location: PACU  Anesthesia Type:GA combined with regional for post-op pain  Level of Consciousness: awake, alert , and oriented  Airway & Oxygen Therapy: Patient Spontanous Breathing  Post-op Assessment: Report given to RN and Post -op Vital signs reviewed and stable  Post vital signs: Reviewed and stable  Last Vitals:  Vitals Value Taken Time  BP 152/65 01/16/24 0926  Temp    Pulse 69 01/16/24 0929  Resp 15 01/16/24 0929  SpO2 93 % 01/16/24 0929  Vitals shown include unfiled device data.  Last Pain:  Vitals:   01/16/24 0721  TempSrc:   PainSc: 0-No pain         Complications: No notable events documented.

## 2024-01-16 NOTE — Op Note (Signed)
 Preoperative diagnosis: umbilical hernia Postoperative diagnosis: 1.5 cm UH, preperitoneal fat incarcerated Procedure: open umbilical hernia repair with 6.4 cm ventralex mesh Surgeon: Dr Harden Mo Anesthesia general with TAP block EBL: minimal Complications none Drains none Specimens none Sponge and needle count correct times two Dispo recovery stable  Indications: This is a 58 year old female who has a primary umbilical hernia.  This has been incarcerated requiring an emergency room visit at least once.  It looks fairly small on her CT scan.  We discussed repair possibly with mesh.  Procedure: After informed consent was obtained she first underwent a tap block.  Antibiotics were given.  SCDs were in place.  She was placed under general anesthesia without complication.  She was prepped and draped in a standard sterile surgical fashion.  A surgical timeout was then performed.  I fulgurated some Marcaine below her umbilicus.  I then made a curvilinear incision.  I dissected the umbilical stalk.  I then divided the umbilical stalk.  She had preperitoneal fat incarcerated in this hernia.  I then was able to dissect around this and reduce it completely.  This was about a 1.5 cm hernia but her fascia was very attenuated.  I developed a preperitoneal space and did not enter the peritoneal cavity.  I elected to place a 6.4 cm Ventralex mesh patch in the preperitoneal space.  I placed this and rolled it flat.  I brought the limbs out.  I then sutured the defect closed with #1 Ethibond and tacked the limbs of the mesh patch down as well.  This completely obliterated the defect.  This was all intact under Valsalva.  Hemostasis was observed.  I then tacked the umbilicus down with two 3-0 Vicryl sutures.  I then closed the incision with 3-0 Vicryl for Monocryl.  Glue and Steri-Strips were applied.  She tolerated this well was extubated and transferred recovery stable.

## 2024-01-17 ENCOUNTER — Encounter (HOSPITAL_COMMUNITY): Payer: Self-pay | Admitting: General Surgery

## 2024-01-22 ENCOUNTER — Other Ambulatory Visit: Payer: Self-pay

## 2024-01-23 ENCOUNTER — Ambulatory Visit (AMBULATORY_SURGERY_CENTER): Payer: Managed Care, Other (non HMO)

## 2024-01-23 VITALS — Ht 68.5 in | Wt 251.0 lb

## 2024-01-23 DIAGNOSIS — Z860100 Personal history of colon polyps, unspecified: Secondary | ICD-10-CM

## 2024-02-01 ENCOUNTER — Ambulatory Visit (HOSPITAL_BASED_OUTPATIENT_CLINIC_OR_DEPARTMENT_OTHER): Payer: Managed Care, Other (non HMO) | Admitting: Family

## 2024-02-05 ENCOUNTER — Encounter: Payer: Self-pay | Admitting: Internal Medicine

## 2024-02-06 LAB — COMPREHENSIVE METABOLIC PANEL
ALT: 11 IU/L (ref 0–32)
AST: 14 IU/L (ref 0–40)
Albumin: 4.1 g/dL (ref 3.8–4.9)
Alkaline Phosphatase: 61 IU/L (ref 44–121)
BUN/Creatinine Ratio: 23 (ref 9–23)
BUN: 16 mg/dL (ref 6–24)
Bilirubin Total: <0.2 mg/dL (ref 0.0–1.2)
CO2: 25 mmol/L (ref 20–29)
Calcium: 9.3 mg/dL (ref 8.7–10.2)
Chloride: 102 mmol/L (ref 96–106)
Creatinine, Ser: 0.69 mg/dL (ref 0.57–1.00)
Globulin, Total: 2.8 g/dL (ref 1.5–4.5)
Glucose: 90 mg/dL (ref 70–99)
Potassium: 4.2 mmol/L (ref 3.5–5.2)
Sodium: 141 mmol/L (ref 134–144)
Total Protein: 6.9 g/dL (ref 6.0–8.5)
eGFR: 101 mL/min/{1.73_m2} (ref >59)

## 2024-02-06 LAB — NMR, LIPOPROFILE
Cholesterol, Total: 217 mg/dL — ABNORMAL HIGH (ref 100–199)
HDL Particle Number: 41.1 umol/L (ref >=30.5)
HDL-C: 59 mg/dL (ref >39)
LDL Particle Number: 1522 nmol/L — ABNORMAL HIGH (ref <1000)
LDL Size: 21.5 nm (ref >20.5)
LDL-C (NIH Calc): 135 mg/dL — ABNORMAL HIGH (ref 0–99)
LP-IR Score: 46 — ABNORMAL HIGH (ref <=45)
Small LDL Particle Number: 451 nmol/L (ref <=527)
Triglycerides: 132 mg/dL (ref 0–149)

## 2024-02-11 ENCOUNTER — Ambulatory Visit (HOSPITAL_BASED_OUTPATIENT_CLINIC_OR_DEPARTMENT_OTHER): Payer: Managed Care, Other (non HMO) | Admitting: Family

## 2024-02-11 ENCOUNTER — Ambulatory Visit (AMBULATORY_SURGERY_CENTER): Payer: Managed Care, Other (non HMO) | Admitting: Internal Medicine

## 2024-02-11 ENCOUNTER — Encounter (HOSPITAL_BASED_OUTPATIENT_CLINIC_OR_DEPARTMENT_OTHER): Payer: Self-pay

## 2024-02-11 ENCOUNTER — Encounter (HOSPITAL_BASED_OUTPATIENT_CLINIC_OR_DEPARTMENT_OTHER): Payer: Self-pay | Admitting: Family

## 2024-02-11 ENCOUNTER — Encounter: Payer: Self-pay | Admitting: Internal Medicine

## 2024-02-11 VITALS — BP 136/68 | HR 59 | Temp 98.0°F | Resp 16 | Ht 68.5 in | Wt 251.0 lb

## 2024-02-11 VITALS — BP 160/90 | HR 58 | Ht 68.5 in | Wt 250.0 lb

## 2024-02-11 DIAGNOSIS — I10 Essential (primary) hypertension: Secondary | ICD-10-CM

## 2024-02-11 DIAGNOSIS — D122 Benign neoplasm of ascending colon: Secondary | ICD-10-CM

## 2024-02-11 DIAGNOSIS — Z860100 Personal history of colon polyps, unspecified: Secondary | ICD-10-CM

## 2024-02-11 DIAGNOSIS — I471 Supraventricular tachycardia, unspecified: Secondary | ICD-10-CM | POA: Diagnosis not present

## 2024-02-11 DIAGNOSIS — I480 Paroxysmal atrial fibrillation: Secondary | ICD-10-CM

## 2024-02-11 DIAGNOSIS — E785 Hyperlipidemia, unspecified: Secondary | ICD-10-CM | POA: Diagnosis not present

## 2024-02-11 DIAGNOSIS — Z1211 Encounter for screening for malignant neoplasm of colon: Secondary | ICD-10-CM

## 2024-02-11 DIAGNOSIS — Z860101 Personal history of adenomatous and serrated colon polyps: Secondary | ICD-10-CM

## 2024-02-11 MED ORDER — CARVEDILOL 6.25 MG PO TABS: 6.2500 mg | ORAL_TABLET | Freq: Two times a day (BID) | ORAL | 1 refills | Status: DC

## 2024-02-11 MED ORDER — SODIUM CHLORIDE 0.9 % IV SOLN: 500.0000 mL | Freq: Once | INTRAVENOUS | Status: DC

## 2024-02-11 NOTE — Progress Notes (Signed)
 Pt's states no medical or surgical changes since previsit or office visit.

## 2024-02-11 NOTE — Patient Instructions (Addendum)
 Medication Instructions:  Your physician recommends that you continue on your current medications as directed. Please refer to the Current Medication list given to you today.  *If you need a refill on your cardiac medications before your next appointment, please call your pharmacy*   Lab Work: Your physician recommends that you return for lab work in July for Fasting NMR & CMP   Follow-Up: At [facility], you and your health needs are our priority.  As part of our continuing mission to provide you with exceptional heart care, we have created designated Provider Care Teams.  These Care Teams include your primary Cardiologist (physician) and Advanced Practice Providers (APPs -  Physician Assistants and Nurse Practitioners) who all work together to provide you with the care you need, when you need it.  We recommend signing up for the patient portal called "MyChart".  Sign up information is provided on this After Visit Summary.  MyChart is used to connect with patients for Virtual Visits (Telemedicine).  Patients are able to view lab/test results, encounter notes, upcoming appointments, etc.  Non-urgent messages can be sent to your provider as well.   To learn more about what you can do with MyChart, go to [email or web address].    Your next appointment:   Phone visit with Gillian Shields, NP in July after labs   Other Instructions Natural remedies have not been found to be effective in lowering cholesterol and preventing progression of aortic atherosclerosis. I've linked a good article below for you if you are interested. It found that low dose statin was significant in reducing cholesterol compared to a number of supplements.   [email or web address]

## 2024-02-11 NOTE — Patient Instructions (Addendum)
Resume previous diet Continue present medications Await pathology results  Handouts/information given for polyps  YOU HAD AN ENDOSCOPIC PROCEDURE TODAY AT THE [facility]:   Refer to the procedure report that was given to you for any specific questions about what was found during the examination.  If the procedure report does not answer your questions, please call your gastroenterologist to clarify.  If you requested that your care partner not be given the details of your procedure findings, then the procedure report has been included in a sealed envelope for you to review at your convenience later.  YOU SHOULD EXPECT: Some feelings of bloating in the abdomen. Passage of more gas than usual.  Walking can help get rid of the air that was put into your GI tract during the procedure and reduce the bloating. If you had a lower endoscopy (such as a colonoscopy or flexible sigmoidoscopy) you may notice spotting of blood in your stool or on the toilet paper. If you underwent a bowel prep for your procedure, you may not have a normal bowel movement for a few days.  Please Note:  You might notice some irritation and congestion in your nose or some drainage.  This is from the oxygen used during your procedure.  There is no need for concern and it should clear up in a day or so.  SYMPTOMS TO REPORT IMMEDIATELY:  Following lower endoscopy (colonoscopy):  Excessive amounts of blood in the stool  Significant tenderness or worsening of abdominal pains  Swelling of the abdomen that is new, acute  Fever of 100F or higher  For urgent or emergent issues, a gastroenterologist can be reached at any hour by calling [phone]. Do not use MyChart messaging for urgent concerns.    DIET:  We do recommend a small meal at first, but then you may proceed to your regular diet.  Drink plenty of fluids but you should avoid alcoholic beverages for 24 hours.  ACTIVITY:  You should plan to take it easy for  the rest of today and you should NOT DRIVE or use heavy machinery until tomorrow (because of the sedation medicines used during the test).    FOLLOW UP: Our staff will call the number listed on your records the next business day following your procedure.  We will call around 7:15- 8:00 am to check on you and address any questions or concerns that you may have regarding the information given to you following your procedure. If we do not reach you, we will leave a message.     If any biopsies were taken you will be contacted by phone or by letter within the next 1-3 weeks.  Please call us at [phone] if you have not heard about the biopsies in 3 weeks.    SIGNATURES/CONFIDENTIALITY: You and/or your care partner have signed paperwork which will be entered into your electronic medical record.  These signatures attest to the fact that that the information above on your After Visit Summary has been reviewed and is understood.  Full responsibility of the confidentiality of this discharge information lies with you and/or your care-partner. 

## 2024-02-11 NOTE — Progress Notes (Signed)
 [facility] Assessment:    Date:  02/11/2024   ID:  Misty Mahoney, DOB 1966/01/02, MRN [id number]  PCP:  Misty Mesi, MD  Cardiologist:  Misty Manges, MD  Nephrologist:  Referring MD: Misty Mesi, MD   CC: Hypertension  History of Present Illness:    Misty Mahoney is a 58 y.o. female with a hx of anemia, hypertension, GERD, diabetes, HLD, obesity, SVT s/p RCFA here to follow up in the [facility].    Prior echo 2018 LVEF 60-65%, gr2DD. Hypertension dates back to birth of her son >20 years ago. She was referred to [facility] after her BP remained difficult to control despite multiple agents. She has known adrenal adenoma but testing negative for hyperaldosterosim and pheochromocytoma. Spironolactone added and Doxazosin discontinued. Renal doppler >60% stenosis ont he right. Abdominal CT 10/2021 demonstrated fibromuscular dysplasia in right renal artery and left distal renal artery. Follows with Dr. Allyson Mahoney.  She underwent right renal artery intervention 01/11/2023. Since last seen PCP has transition chlorthalidone to triamterene-hydrochlorothiazide.  Last seen via video visit 12/27/23 with Carvedilol increased to 6.25mg  BID and encouraged to take triamterene-HCTZ more routinely for BP control. Clearance provided for hernia surgery and colonoscopy.   Presents today for follow up with her husband. Blood pressure in the morning upper 130s prior to medications and most often 120s after medications. Taking a half tablet of the triamterene-hydrochlorothiazide daily with no leg cramping. Reports no shortness of breath nor dyspnea on exertion. Reports no chest pain, pressure, or tightness. No edema, orthopnea, PND. Reports no palpitations.  Notes not exercising as regularly recently and motivated to make lifestyle changes after recent labs 02/05/24 LDL 135, LDL particle number 1,522, total cholesterol 217.   Previous  antihypertensives: Lisinopril - Severe Cough Irbesartan - Ineffective Spironolactone - cramps    Past Medical History:  Diagnosis Date   Anemia    gestational   Arterial fibromuscular dysplasia (HCC) 11/29/2021   Atherosclerosis 06/26/2022   Atypical mole 07/24/2012   right abdomen moderate   Atypical mole 07/10/2018   right supra pubic moderate   Chicken pox as a child   Chronic kidney disease    Left Renal Artery Aneurysm   Depression 06/15/2015   DM, gestational, diet controlled 04/10/2014   insulin resistant- A1C5.7    Dysrhythmia    A. Fib   GERD (gastroesophageal reflux disease)    undx'd GERD    Hyperlipidemia 12/20/2015   Hypertension    Neuromuscular disorder (HCC)    Fibromuscular dysplasia in Kidney   Obesity, unspecified 04/10/2014   OSA (obstructive sleep apnea)    uses cpap   Osteoarthritis    Paroxysmal atrial fibrillation (HCC)    Peripheral vascular disease (HCC)    Renal Artery Stenosis   Pre-diabetes    Prediabetes    SVT (supraventricular tachycardia) (HCC)    s/p RFCA 09/26/12   SVT (supraventricular tachycardia) (HCC)    a. s/p slow pathway modification 2013 Dr Ladona Ridgel   Vitamin D deficiency 12/26/2015    Past Surgical History:  Procedure Laterality Date   ADENOIDECTOMY     BREAST LUMPECTOMY WITH RADIOACTIVE SEED LOCALIZATION Right 08/03/2020   Procedure: RIGHT BREAST EXCISIONAL BIOPSY WITH RADIOACTIVE SEED LOCALIZATION;  Surgeon: [name], MD;  Location: [facility];  Service: General;  Laterality: Right;   ENDOVENOUS ABLATION SAPHENOUS VEIN W/ LASER Bilateral    Thermal ablation   INSERTION OF MESH  01/16/2024   Procedure: INSERTION OF MESH;  Surgeon: [name], MD;  Location: [facility] OR;  Service: General;;   KNEE ARTHROSCOPY WITH MENISCAL REPAIR Left 08/25/2016   Procedure: KNEE ARTHROSCOPY WITH partial medial meniscectomy, chondroplasty;  Surgeon: Marcene Corning, MD;  Location: [facility];   Service: Orthopedics;  Laterality: Left;  KNEE ARTHROSCOPY WITH partial medial meniscectomy, chondroplasty   RENAL ANGIOGRAPHY N/A 01/11/2023   Procedure: RENAL ANGIOGRAPHY;  Surgeon: Runell Gess, MD;  Location: [facility];  Service: Cardiovascular;  Laterality: N/A;   RENAL INTERVENTION  01/11/2023   Procedure: RENAL INTERVENTION;  Surgeon: Runell Gess, MD;  Location: [facility];  Service: Cardiovascular;;   TONSILLECTOMY  58 yrs old   UMBILICAL HERNIA REPAIR N/A 01/16/2024   Procedure: OPEN UMBILICAL HERNIA REPAIR WITH MESH;  Surgeon: [name], MD;  Location: [facility] OR;  Service: General;  Laterality: N/A;   V-TACH ABLATION N/A 09/26/2012   Procedure: V-TACH ABLATION;  Surgeon: Marinus Maw, MD;  Location: [facility] CATH LAB;  Service: Cardiovascular;  Laterality: N/A;   WISDOM TOOTH EXTRACTION  58 yrs old    Current Medications: Current Meds  Medication Sig   Cholecalciferol (VITAMIN D) 2000 units CAPS Take 2,000 Units by mouth daily.   cyanocobalamin 1000 MCG tablet Take 1,000 mcg by mouth daily.   diltiazem (CARDIZEM CD) 240 MG 24 hr capsule Take 1 capsule (240 mg total) by mouth daily.   escitalopram (LEXAPRO) 10 MG tablet Take 1 tablet by mouth daily.   flecainide (TAMBOCOR) 150 MG tablet Take 2 tablets (300 mg total) by mouth as needed.   hydrALAZINE (APRESOLINE) 10 MG tablet Take 1 tablet (10 mg total) by mouth as needed (as needed for systolic blood presure more than 180).   Magnesium 250 MG TABS Take 250 mg by mouth daily.   Multiple Vitamin (MULTIVITAMIN) tablet Take 1 tablet by mouth daily.   NON FORMULARY Pt uses a cpap nightly   pyridoxine (B-6) 100 MG tablet Take 100 mg by mouth daily.   triamterene-hydrochlorothiazide (MAXZIDE-25) 37.5-25 MG tablet Take 0.5-1 tablets by mouth daily as needed (fluid).   valsartan (DIOVAN) 320 MG tablet Take 1 tablet (320 mg total) by mouth daily.   [DISCONTINUED] carvedilol (COREG) 3.125 MG tablet Take 2 tablets  (6.25 mg total) by mouth 2 (two) times daily.     Allergies:   Sunflower oil, Ultram [tramadol], Epinephrine, and Lisinopril   Social History   Socioeconomic History   Marital status: Significant Other    Spouse name: Jacqulyn Bath   Number of children: 2   Years of education: Not on file   Highest education level: Not on file  Occupational History   Occupation: RN  Tobacco Use   Smoking status: Never    Passive exposure: Never   Smokeless tobacco: Never  Vaping Use   Vaping status: Never Used  Substance and Sexual Activity   Alcohol use: Yes    Comment: Rarely   Drug use: No   Sexual activity: Yes    Partners: Male    Comment: lives with kids, significant other and step daughter. minimizing dairy and gluten, exercise  Other Topics Concern   Not on file  Social History Narrative   Not on file   Social Drivers of Health   Financial Resource Strain: Patient Declined (07/11/2023)   Received from [facility]   Overall Financial Resource Strain (CARDIA)    Difficulty of Paying Living Expenses: Patient declined  Food Insecurity: Patient Declined (07/11/2023)   Received from  [facility]   Hunger Vital Sign    Worried About Running Out of Food in the Last Year: Patient declined    Ran Out of Food in the Last Year: Patient declined  Transportation Needs: Patient Declined (07/11/2023)   Received from [facility] - Transportation    Lack of Transportation (Medical): Patient declined    Lack of Transportation (Non-Medical): Patient declined  Physical Activity: Insufficiently Active (07/19/2021)   Exercise Vital Sign    Days of Exercise per Week: 2 days    Minutes of Exercise per Session: 30 min  Stress: No Stress Concern Present (07/19/2021)   [employer] [facility] [employer] - Occupational Stress Questionnaire    Feeling of Stress : Not at all  Social Connections: Unknown (07/02/2023)   Received from [facility]   Social Network    Social Network: Not  on file     Family History: The patient's family history includes ADD / ADHD in her daughter and son; Alzheimer's disease in her father; Atrial fibrillation in her father and mother; Breast cancer (age of onset: 50) in her sister; Cancer in her maternal grandfather and paternal grandmother; Cancer (age of onset: 4) in her sister; Cataracts in her father; Colon cancer in her maternal grandmother; Colon polyps in her father; Dementia in her father and mother; Heart attack in her maternal grandfather; Hypertension in her brother, father, and mother; Stroke in her mother. There is no history of Esophageal cancer, Rectal cancer, or Stomach cancer.  ROS:   Please see the history of present illness.    All other systems reviewed and are negative.  EKGs/Labs/Other Studies Reviewed:    EKG:  EKG is  ordered today.  The ekg ordered today demonstrates SB 57 bpm with no acute ST/T wave changes.   Recent Labs: 12/24/2023: Hemoglobin 11.2; Platelets 233 02/05/2024: ALT 11; BUN 16; Creatinine, Ser 0.69; Potassium 4.2; Sodium 141   Recent Lipid Panel    Component Value Date/Time   CHOL 175 07/17/2019 0900   CHOL 177 08/22/2018 1015   TRIG 93.0 07/17/2019 0900   HDL 57.90 07/17/2019 0900   HDL 55 08/22/2018 1015   CHOLHDL 3 07/17/2019 0900   VLDL 18.6 07/17/2019 0900   LDLCALC 99 07/17/2019 0900   LDLCALC 100 (H) 08/22/2018 1015    Physical Exam:   VS:  BP (!) 160/90 (BP Location: Left Arm, Patient Position: Sitting, Cuff Size: Large)   Pulse (!) 58   Ht 5' 8.5" (1.74 m)   Wt 250 lb (113.4 kg)   LMP 06/24/2018   SpO2 98%   BMI 37.46 kg/m  , BMI Body mass index is 37.46 kg/m.   GENERAL:  Well appearing, overweight HEENT: Pupils equal round and reactive, fundi not visualized, oral mucosa unremarkable NECK:  No jugular venous distention, waveform within normal limits, carotid upstroke brisk and symmetric, no bruits, no thyromegaly LYMPHATICS:  No cervical adenopathy LUNGS:  Clear to  auscultation bilaterally HEART:  RRR.  PMI not displaced or sustained,S1 and S2 within normal limits, no S3, no S4, no clicks, no rubs, no murmurs ABD:  Flat, positive bowel sounds normal in frequency in pitch, no bruits, no rebound, no guarding, no midline pulsatile mass, no hepatomegaly, no splenomegaly EXT:  2 plus pulses throughout, no edema, no cyanosis no clubbing SKIN:  No rashes no nodules NEURO:  Cranial nerves II through XII grossly intact, motor grossly intact throughout PSYCH:  Cognitively intact, oriented to person place and time  ASSESSMENT/PLAN:    Arterial fibromuscular dysplasia / HTN - BP elevated in clinic (though had colonoscopy this morning) though at goal <130/80 after medications at home. Renal artery fibromuscular dysplasia followed by Dr. Allyson Mahoney. Continue Coreg 6.25mg  BID, triamterene-hctz half tablet daily, Diltiazem 240mg  QD, Valsartan 320mg  QD. Continue Hydralazine 10mg  PRN for SBP >180. Discussed to monitor BP at home at least 2 hours after medications and sitting for 5-10 minutes.   Atrial fibrillation - s/p RCFA. Shares with me she has about 1 episode per year which is short lived. Managed by PRN Flecainide. Not on OAC.   Atherosclerosis / HLD - Known mild atherosclerosis of cerebral vascular. Ideally LDL goal <70. At last visit she preferred ot work on diet and exercise. 02/05/24  LDL 135, LDL particle number 1,522, total cholesterol 217. Reviewed efficacy and safety of statins. She prefers to make lifestyle changes for 4 months prior to considering statin. Repeat CMP/NMR in 4 months. If LDL not nearing goal of <70, plan to add Rosuvastatin 5mg  three times per week.   SVT - Stable on diltiazem 240mg  daily and carvedilol 6.25mg  BID>   Screening for Secondary Hypertension:     07/19/2021    9:12 AM 08/18/2021    4:24 PM  Causes  Drugs/Herbals Screened      - Comments NSAIDS twice per week   Renovascular HTN Screened Screened     - Comments Check renal artery  Dopplers right RAS, has appt with Dr. Allyson Mahoney in October  Sleep Apnea Screened      - Comments on CPAP   Thyroid Disease Screened      - Comments repeat TSH   Hyperaldosteronism Screened Screened     - Comments check renal artery Dopplers. WNL  Pheochromocytoma Screened      - Comments Negative in 2019.  No symtpoms.   Cushing's Syndrome Screened      - Comments cortisol normal     Relevant Labs/Studies:    Latest Ref Rng & Units 02/05/2024   11:42 AM 12/24/2023    9:16 AM 01/03/2023   12:08 PM  Basic Labs  Sodium 134 - 144 mmol/L 141  141  141   Potassium 3.5 - 5.2 mmol/L 4.2  3.8  4.6   Creatinine 0.57 - 1.00 mg/dL 1.61  0.96  0.45        Latest Ref Rng & Units 07/17/2019    9:00 AM 03/05/2018    9:01 AM  Thyroid   TSH 0.35 - 4.50 uIU/mL 1.39  1.900        Latest Ref Rng & Units 08/05/2021   10:37 AM 02/14/2018    1:57 PM  Renin/Aldosterone   Aldosterone 0.0 - 30.0 ng/dL 4.5  6   Renin 4.098 - 5.380 ng/mL/hr 0.312    Aldos/Renin Ratio 0.0 - 30.0 14.4            Latest Ref Rng & Units 12/13/2017   10:29 AM  Cortisol  Cortisol  ug/dL 4.2        12/08/1476    4:08 PM  Renovascular   Renal Artery [place] Completed Yes        Disposition:    FU with phone visit 05/2024   Medication Adjustments/Labs and Tests Ordered: Current medicines are reviewed at length with the patient today.  Concerns regarding medicines are outlined above.  Orders Placed This Encounter  Procedures   NMR, lipoprofile   Comprehensive metabolic panel   Meds ordered this encounter  Medications  carvedilol (COREG) 6.25 MG tablet    Sig: Take 1 tablet (6.25 mg total) by mouth 2 (two) times daily.    Dispense:  180 tablet    Refill:  1    Supervising Provider:   Jodelle Red [[id number]]     Signed, Alver Sorrow, NP  02/11/2024 8:51 PM    [facility]

## 2024-02-11 NOTE — Progress Notes (Signed)
 HISTORY OF PRESENT ILLNESS:  Misty Mahoney is a 58 y.o. female with a history of adenomatous and sessile serrated polyps.  For surveillance colonoscopy  REVIEW OF SYSTEMS:  All non-GI ROS negative except for  Past Medical History:  Diagnosis Date   Anemia    gestational   Arterial fibromuscular dysplasia (HCC) 11/29/2021   Atherosclerosis 06/26/2022   Atypical mole 07/24/2012   right abdomen moderate   Atypical mole 07/10/2018   right supra pubic moderate   Chicken pox as a child   Chronic kidney disease    Left Renal Artery Aneurysm   Depression 06/15/2015   DM, gestational, diet controlled 04/10/2014   insulin resistant- A1C5.7    Dysrhythmia    A. Fib   GERD (gastroesophageal reflux disease)    undx'd GERD    Hyperlipidemia 12/20/2015   Hypertension    Neuromuscular disorder (HCC)    Fibromuscular dysplasia in Kidney   Obesity, unspecified 04/10/2014   OSA (obstructive sleep apnea)    uses cpap   Osteoarthritis    Paroxysmal atrial fibrillation (HCC)    Peripheral vascular disease (HCC)    Renal Artery Stenosis   Pre-diabetes    Prediabetes    SVT (supraventricular tachycardia) (HCC)    s/p RFCA 09/26/12   SVT (supraventricular tachycardia) (HCC)    a. s/p slow pathway modification 2013 Dr Ladona Ridgel   Vitamin D deficiency 12/26/2015    Past Surgical History:  Procedure Laterality Date   ADENOIDECTOMY     BREAST LUMPECTOMY WITH RADIOACTIVE SEED LOCALIZATION Right 08/03/2020   Procedure: RIGHT BREAST EXCISIONAL BIOPSY WITH RADIOACTIVE SEED LOCALIZATION;  Surgeon: [name], MD;  Location: [facility];  Service: General;  Laterality: Right;   ENDOVENOUS ABLATION SAPHENOUS VEIN W/ LASER Bilateral    Thermal ablation   INSERTION OF MESH  01/16/2024   Procedure: INSERTION OF MESH;  Surgeon: [name], MD;  Location: [facility] OR;  Service: General;;   KNEE ARTHROSCOPY WITH MENISCAL REPAIR Left 08/25/2016   Procedure: KNEE ARTHROSCOPY WITH  partial medial meniscectomy, chondroplasty;  Surgeon: Marcene Corning, MD;  Location: [facility];  Service: Orthopedics;  Laterality: Left;  KNEE ARTHROSCOPY WITH partial medial meniscectomy, chondroplasty   RENAL ANGIOGRAPHY N/A 01/11/2023   Procedure: RENAL ANGIOGRAPHY;  Surgeon: Runell Gess, MD;  Location: [facility];  Service: Cardiovascular;  Laterality: N/A;   RENAL INTERVENTION  01/11/2023   Procedure: RENAL INTERVENTION;  Surgeon: Runell Gess, MD;  Location: [facility];  Service: Cardiovascular;;   TONSILLECTOMY  58 yrs old   UMBILICAL HERNIA REPAIR N/A 01/16/2024   Procedure: OPEN UMBILICAL HERNIA REPAIR WITH MESH;  Surgeon: [name], MD;  Location: [facility] OR;  Service: General;  Laterality: N/A;   V-TACH ABLATION N/A 09/26/2012   Procedure: V-TACH ABLATION;  Surgeon: Marinus Maw, MD;  Location: [facility] CATH LAB;  Service: Cardiovascular;  Laterality: N/A;   WISDOM TOOTH EXTRACTION  58 yrs old    Social History Misty Mahoney  reports that she has never smoked. She has never been exposed to tobacco smoke. She has never used smokeless tobacco. She reports current alcohol use. She reports that she does not use drugs.  family history includes ADD / ADHD in her daughter and son; Alzheimer's disease in her father; Atrial fibrillation in her father and mother; Breast cancer (age of onset: 59) in her sister; Cancer in her maternal grandfather and paternal grandmother; Cancer (age of onset: 70) in her sister; Cataracts  in her father; Colon cancer in her maternal grandmother; Colon polyps in her father; Dementia in her father and mother; Heart attack in her maternal grandfather; Hypertension in her brother, father, and mother; Stroke in her mother.  Allergies  Allergen Reactions   Sunflower Oil Swelling    Seeds    Ultram [Tramadol] Itching and Nausea Only   Epinephrine Other (See Comments)    Hx of a fib   Lisinopril Cough       PHYSICAL  EXAMINATION: Vital signs: BP (!) 166/67   Pulse 62   Temp 98 F (36.7 C) (Skin)   Ht 5' 8.5" (1.74 m)   Wt 251 lb (113.9 kg)   LMP 06/24/2018   SpO2 98%   BMI 37.61 kg/m  General: Well-developed, well-nourished, no acute distress HEENT: Sclerae are anicteric, conjunctiva pink. Oral mucosa intact Lungs: Clear Heart: Regular Abdomen: soft, nontender, nondistended, no obvious ascites, no peritoneal signs, normal bowel sounds. No organomegaly. Extremities: No edema Psychiatric: alert and oriented x3. Cooperative     ASSESSMENT:  History of sessile serrated and adenomatous polyps   PLAN:   Survey colonoscopy

## 2024-02-11 NOTE — Op Note (Signed)
 [facility] Patient Name: Misty Mahoney Procedure Date: 02/11/2024 9:04 AM MRN: [id number] Endoscopist: Wilhemina Bonito. Marina Goodell , MD, [id number] Age: 58 Referring MD:  Date of Birth: 1965/12/23 Gender: Female Account #: [id number] Procedure:                Colonoscopy with cold snare polypectomy x 2 Indications:              High risk colon cancer surveillance: Personal                            history of non-advanced adenoma, High risk colon                            cancer surveillance: Personal history of sessile                            serrated colon polyp (less than 10 mm in size) with                            no dysplasia. Previous exam 2019 Medicines:                Monitored Anesthesia Care Procedure:                Pre-Anesthesia Assessment:                           - Prior to the procedure, a History and Physical                            was performed, and patient medications and                            allergies were reviewed. The patient's tolerance of                            previous anesthesia was also reviewed. The risks                            and benefits of the procedure and the sedation                            options and risks were discussed with the patient.                            All questions were answered, and informed consent                            was obtained. Prior Anticoagulants: The patient has                            taken no anticoagulant or antiplatelet agents. ASA                            Grade Assessment: II - A patient with mild systemic  disease. After reviewing the risks and benefits,                            the patient was deemed in satisfactory condition to                            undergo the procedure.                           After obtaining informed consent, the colonoscope                            was passed under direct vision. Throughout the                             procedure, the patient's blood pressure, pulse, and                            oxygen saturations were monitored continuously. The                            CF [id number] #[id number] was introduced through the anus                            and advanced to the the cecum, identified by                            appendiceal orifice and ileocecal valve. The                            ileocecal valve, appendiceal orifice, and rectum                            were photographed. The quality of the bowel                            preparation was excellent. The colonoscopy was                            performed without difficulty. The patient tolerated                            the procedure well. The bowel preparation used was                            SUPREP via split dose instruction. Scope In: 9:27:18 AM Scope Out: 9:42:21 AM Scope Withdrawal Time: 0 hours 10 minutes 44 seconds  Total Procedure Duration: 0 hours 15 minutes 3 seconds  Findings:                 Two polyps were found in the ascending colon. The                            polyps were 1 to 2 mm in size. These polyps  were                            removed with a cold snare. Resection and retrieval                            were complete.                           The exam was otherwise without abnormality on                            direct and retroflexion views. Complications:            No immediate complications. Estimated blood loss:                            None. Estimated Blood Loss:     Estimated blood loss: none. Impression:               - Two 1 to 2 mm polyps in the ascending colon,                            removed with a cold snare. Resected and retrieved.                           - The examination was otherwise normal on direct                            and retroflexion views. Recommendation:           - Repeat colonoscopy in 5 years for surveillance.                           - Patient has a contact number  available for                            emergencies. The signs and symptoms of potential                            delayed complications were discussed with the                            patient. Return to normal activities tomorrow.                            Written discharge instructions were provided to the                            patient.                           - Resume previous diet.                           - Continue present medications.                           -  Await pathology results. Wilhemina Bonito. Marina Goodell, MD 02/11/2024 9:46:45 AM This report has been signed electronically.

## 2024-02-11 NOTE — Progress Notes (Signed)
 A/O x 3, gd SR's, VSS, report to RN

## 2024-02-12 ENCOUNTER — Telehealth: Payer: Self-pay | Admitting: *Deleted

## 2024-02-12 NOTE — Telephone Encounter (Signed)
No answer for post procedure call. Left VM.

## 2024-02-13 ENCOUNTER — Encounter: Payer: Self-pay | Admitting: Internal Medicine

## 2024-02-13 LAB — SURGICAL PATHOLOGY

## 2024-02-22 ENCOUNTER — Other Ambulatory Visit (HOSPITAL_BASED_OUTPATIENT_CLINIC_OR_DEPARTMENT_OTHER): Payer: Self-pay

## 2024-03-03 ENCOUNTER — Other Ambulatory Visit: Payer: Self-pay | Admitting: General Surgery

## 2024-03-03 DIAGNOSIS — Z0000 Encounter for general adult medical examination without abnormal findings: Secondary | ICD-10-CM

## 2024-03-31 ENCOUNTER — Ambulatory Visit
Admission: RE | Admit: 2024-03-31 | Discharge: 2024-03-31 | Disposition: A | Discharge status: Home / Self Care | Source: Ambulatory Visit | Attending: General Surgery | Admitting: General Surgery

## 2024-03-31 DIAGNOSIS — Z0000 Encounter for general adult medical examination without abnormal findings: Secondary | ICD-10-CM

## 2024-04-03 ENCOUNTER — Other Ambulatory Visit: Payer: Self-pay | Admitting: General Surgery

## 2024-04-03 DIAGNOSIS — R928 Other abnormal and inconclusive findings on diagnostic imaging of breast: Secondary | ICD-10-CM

## 2024-04-07 ENCOUNTER — Encounter (HOSPITAL_BASED_OUTPATIENT_CLINIC_OR_DEPARTMENT_OTHER): Payer: Self-pay

## 2024-04-07 MED ORDER — FLECAINIDE ACETATE 150 MG PO TABS: 300.0000 mg | ORAL_TABLET | ORAL | 3 refills | Status: AC | PRN

## 2024-04-21 ENCOUNTER — Ambulatory Visit
Admission: RE | Admit: 2024-04-21 | Discharge: 2024-04-21 | Disposition: A | Discharge status: Home / Self Care | Source: Ambulatory Visit | Attending: General Surgery | Admitting: General Surgery

## 2024-04-21 ENCOUNTER — Other Ambulatory Visit: Payer: Self-pay | Admitting: General Surgery

## 2024-04-21 DIAGNOSIS — R928 Other abnormal and inconclusive findings on diagnostic imaging of breast: Secondary | ICD-10-CM

## 2024-04-22 ENCOUNTER — Ambulatory Visit
Admission: RE | Admit: 2024-04-22 | Discharge: 2024-04-22 | Disposition: A | Discharge status: Home / Self Care | Source: Ambulatory Visit | Attending: General Surgery | Admitting: General Surgery

## 2024-04-22 ENCOUNTER — Other Ambulatory Visit: Payer: Self-pay | Admitting: General Surgery

## 2024-04-22 DIAGNOSIS — R928 Other abnormal and inconclusive findings on diagnostic imaging of breast: Secondary | ICD-10-CM

## 2024-04-22 HISTORY — PX: BREAST BIOPSY: SHX20

## 2024-04-23 LAB — SURGICAL PATHOLOGY

## 2024-04-30 ENCOUNTER — Encounter (HOSPITAL_BASED_OUTPATIENT_CLINIC_OR_DEPARTMENT_OTHER): Payer: Self-pay

## 2024-04-30 DIAGNOSIS — I471 Supraventricular tachycardia, unspecified: Secondary | ICD-10-CM

## 2024-04-30 DIAGNOSIS — I480 Paroxysmal atrial fibrillation: Secondary | ICD-10-CM

## 2024-05-01 ENCOUNTER — Ambulatory Visit: Attending: Family

## 2024-05-01 DIAGNOSIS — I471 Supraventricular tachycardia, unspecified: Secondary | ICD-10-CM

## 2024-05-01 DIAGNOSIS — I480 Paroxysmal atrial fibrillation: Secondary | ICD-10-CM

## 2024-05-01 NOTE — Progress Notes (Unsigned)
 Enrolled for [employer] to mail a ZIO XT long term holter monitor to the patients address on file.   Dr. Rodolfo Clan or EP to read.

## 2024-06-06 ENCOUNTER — Telehealth (HOSPITAL_BASED_OUTPATIENT_CLINIC_OR_DEPARTMENT_OTHER): Payer: Self-pay | Admitting: Cardiovascular Disease

## 2024-06-06 NOTE — Telephone Encounter (Signed)
 Patient wants a call back to reschedule her tele visit with NP Caitlin on 7/21.

## 2024-06-06 NOTE — Telephone Encounter (Signed)
 Spoke with pt. Pt is having dental surgery that day and needed to reschedule. Rescheduled.

## 2024-06-09 ENCOUNTER — Telehealth (HOSPITAL_BASED_OUTPATIENT_CLINIC_OR_DEPARTMENT_OTHER): Admitting: Family

## 2024-06-18 ENCOUNTER — Encounter (HOSPITAL_COMMUNITY): Payer: Self-pay

## 2024-07-24 ENCOUNTER — Other Ambulatory Visit: Payer: Self-pay | Admitting: Cardiovascular Disease

## 2024-07-24 DIAGNOSIS — I701 Atherosclerosis of renal artery: Secondary | ICD-10-CM

## 2024-08-06 ENCOUNTER — Ambulatory Visit: Payer: Self-pay | Admitting: Cardiovascular Disease

## 2024-08-06 ENCOUNTER — Ambulatory Visit (HOSPITAL_COMMUNITY)
Admission: RE | Admit: 2024-08-06 | Discharge: 2024-08-06 | Disposition: A | Discharge status: Home / Self Care | Source: Ambulatory Visit | Attending: Vascular Surgery | Admitting: Vascular Surgery

## 2024-08-06 DIAGNOSIS — I701 Atherosclerosis of renal artery: Secondary | ICD-10-CM | POA: Insufficient documentation

## 2024-08-08 DIAGNOSIS — I471 Supraventricular tachycardia, unspecified: Secondary | ICD-10-CM

## 2024-08-08 DIAGNOSIS — I480 Paroxysmal atrial fibrillation: Secondary | ICD-10-CM

## 2024-08-11 ENCOUNTER — Ambulatory Visit (HOSPITAL_BASED_OUTPATIENT_CLINIC_OR_DEPARTMENT_OTHER): Payer: Self-pay | Admitting: Family

## 2024-08-15 LAB — THYROID PANEL WITH TSH
Free Thyroxine Index: 1.8 (ref 1.2–4.9)
T3 Uptake Ratio: 28 % (ref 24–39)
T4, Total: 6.6 ug/dL (ref 4.5–12.0)
TSH: 1.09 u[IU]/mL (ref 0.450–4.500)

## 2024-08-15 LAB — CBC
Hematocrit: 33.6 % — ABNORMAL LOW (ref 34.0–46.6)
Hemoglobin: 11.1 g/dL (ref 11.1–15.9)
MCH: 30.1 pg (ref 26.6–33.0)
MCHC: 33 g/dL (ref 31.5–35.7)
MCV: 91 fL (ref 79–97)
Platelets: 258 x10E3/uL (ref 150–450)
RBC: 3.69 x10E6/uL — ABNORMAL LOW (ref 3.77–5.28)
RDW: 13.1 % (ref 11.7–15.4)
WBC: 6.9 x10E3/uL (ref 3.4–10.8)

## 2024-08-15 LAB — POTASSIUM: Potassium: 4.4 mmol/L (ref 3.5–5.2)

## 2024-08-15 LAB — MAGNESIUM: Magnesium: 2.3 mg/dL (ref 1.6–2.3)

## 2024-08-29 ENCOUNTER — Encounter (HOSPITAL_BASED_OUTPATIENT_CLINIC_OR_DEPARTMENT_OTHER): Payer: Self-pay | Admitting: Family

## 2024-08-29 ENCOUNTER — Telehealth (HOSPITAL_BASED_OUTPATIENT_CLINIC_OR_DEPARTMENT_OTHER): Payer: Self-pay

## 2024-08-29 ENCOUNTER — Telehealth (HOSPITAL_BASED_OUTPATIENT_CLINIC_OR_DEPARTMENT_OTHER): Admitting: Family

## 2024-08-29 DIAGNOSIS — E785 Hyperlipidemia, unspecified: Secondary | ICD-10-CM

## 2024-08-29 DIAGNOSIS — I471 Supraventricular tachycardia, unspecified: Secondary | ICD-10-CM

## 2024-08-29 DIAGNOSIS — I10 Essential (primary) hypertension: Secondary | ICD-10-CM | POA: Diagnosis not present

## 2024-08-29 DIAGNOSIS — I480 Paroxysmal atrial fibrillation: Secondary | ICD-10-CM

## 2024-08-29 DIAGNOSIS — I773 Arterial fibromuscular dysplasia: Secondary | ICD-10-CM

## 2024-08-29 MED ORDER — CARVEDILOL 12.5 MG PO TABS: 12.5000 mg | ORAL_TABLET | Freq: Two times a day (BID) | ORAL | 1 refills | Status: DC

## 2024-08-29 MED ORDER — TRIAMTERENE-HCTZ 37.5-25 MG PO TABS: 0.5000 | ORAL_TABLET | Freq: Every day | ORAL | 3 refills | Status: AC

## 2024-08-29 NOTE — Telephone Encounter (Signed)
  Patient Consent for Virtual Visit         Misty Mahoney has provided verbal consent on 08/29/2024 for a virtual visit (video or telephone).   CONSENT FOR VIRTUAL VISIT FOR:  Misty Mahoney  By participating in this virtual visit I agree to the following:  I hereby voluntarily request, consent and authorize [facility] and its employed or contracted physicians, physician assistants, nurse practitioners or other licensed health care professionals (the Practitioner), to provide me with telemedicine health care services (the "Services) as deemed necessary by the treating Practitioner. I acknowledge and consent to receive the Services by the Practitioner via telemedicine. I understand that the telemedicine visit will involve communicating with the Practitioner through live audiovisual communication technology and the disclosure of certain medical information by electronic transmission. I acknowledge that I have been given the opportunity to request an in-person assessment or other available alternative prior to the telemedicine visit and am voluntarily participating in the telemedicine visit.  I understand that I have the right to withhold or withdraw my consent to the use of telemedicine in the course of my care at any time, without affecting my right to future care or treatment, and that the Practitioner or I may terminate the telemedicine visit at any time. I understand that I have the right to inspect all information obtained and/or recorded in the course of the telemedicine visit and may receive copies of available information for a reasonable fee.  I understand that some of the potential risks of receiving the Services via telemedicine include:  Delay or interruption in medical evaluation due to technological equipment failure or disruption; Information transmitted may not be sufficient (e.g. poor resolution of images) to allow for appropriate medical decision making by the  Practitioner; and/or  In rare instances, security protocols could fail, causing a breach of personal health information.  Furthermore, I acknowledge that it is my responsibility to provide information about my medical history, conditions and care that is complete and accurate to the best of my ability. I acknowledge that Practitioner's advice, recommendations, and/or decision may be based on factors not within their control, such as incomplete or inaccurate data provided by me or distortions of diagnostic images or specimens that may result from electronic transmissions. I understand that the practice of medicine is not an exact science and that Practitioner makes no warranties or guarantees regarding treatment outcomes. I acknowledge that a copy of this consent can be made available to me via my patient portal [facility] MyChart), or I can request a printed copy by calling the office of [facility].    I understand that my insurance will be billed for this visit.   I have read or had this consent read to me. I understand the contents of this consent, which adequately explains the benefits and risks of the Services being provided via telemedicine.  I have been provided ample opportunity to ask questions regarding this consent and the Services and have had my questions answered to my satisfaction. I give my informed consent for the services to be provided through the use of telemedicine in my medical care

## 2024-08-29 NOTE — Patient Instructions (Addendum)
 Medication Instructions:  INCREASE Carvedilol  to 12.5mg  tablet twice per day   Labwork: Repeat cholesterol labs one week prior to March follow up visit. Let us  know if you need us  to order the labs if not collected by primary care team.   Follow-Up: Please follow up in March as scheduled   Special Instructions:    Our goal is for your total cholesterol to be less than 200 and your LDL (bad cholesterol) to be less than 70.

## 2024-08-29 NOTE — Progress Notes (Signed)
 Virtual Visit via Telephone Note   Because of Misty Mahoney's co-morbid illnesses, she is at least at moderate risk for complications without adequate follow up.  This format is felt to be most appropriate for this patient at this time.  The patient did not have access to video technology/had technical difficulties with video requiring transitioning to audio format only (telephone).  All issues noted in this document were discussed and addressed.  No physical exam could be performed with this format.  Please refer to the patient's chart for her consent to telehealth for [facility].    Date:  08/29/2024   ID:  Misty Mahoney, DOB [birth year], MRN [id number] The patient was identified using 2 identifiers.  Patient Location: Home Provider Location: Office/Clinic   PCP:  Hughie Sharper, MD   [facility] Providers Cardiologist:  None Cardiology APP:  Vannie Reche RAMAN, NP  Electrophysiologist:  Fonda Kitty, MD     Evaluation Performed:  Follow-Up Visit  Chief Complaint:  HTN follow up   History of Present Illness:    Misty Mahoney is a 58 y.o. female with  with a hx of anemia, hypertension, GERD, diabetes, HLD, obesity, SVT s/p RCFA here to follow up in the [facility].    Prior echo 2018 LVEF 60-65%, gr2DD. Hypertension dates back to birth of her son >20 years ago. She was referred to [facility] after her BP remained difficult to control despite multiple agents. She has known adrenal adenoma but testing negative for hyperaldosterosim and pheochromocytoma. Spironolactone  added and Doxazosin  discontinued. Renal doppler >60% stenosis ont he right. Abdominal CT 10/2021 demonstrated fibromuscular dysplasia in right renal artery and left distal renal artery. Follows with Dr. Court.  She underwent right renal artery intervention 01/11/2023. Since last seen PCP has transition chlorthalidone  to triamterene -hydrochlorothiazide .   Last  seen via video visit 12/27/23 with Carvedilol  increased to 6.25mg  BID and encouraged to take triamterene -HCTZ more routinely for BP control. Clearance provided for hernia surgery and colonoscopy.    At visit 02/07/2024 BP was at goal at home and present antihypertensive regimen of carvedilol  6.25 mg twice daily, triamterene -HCT half tablet daily, diltiazem  240 mg daily, valsartan  320 mg daily was continued.  Elevated cholesterol with LDL 135, LDL particle number [id number] and she wished to make lifestyle changes prior to addition of statin.   Due to increased frequency of palpitations monitor 04/2024 predominantly normal sinus rhythm average heart rate 67 bpm, 80 episodes of nonsustained SVT longest 14.5 seconds, atrial fibrillation with less than 1% burden longest episode 1 hour 58 minutes.  Triggered episodes corresponded with atrial fibrillation sinus rhythm with ectopy.  Ectopy was rare.  Renal duplex 08/06/2024 with patent right renal artery s/p remote PTA for FMD.  Repeat study in 12 months recommended.   Presents today for follow up via phone. In discussing prior palpitations, she realized she was drinking something with red dye, stopped drinking, and this significantly reduced her palpitations frequency. Her one episode of PAF on monitor resolved with PRN Flecainide . Notes if she has PAF she takes Flecainide , lays down, resolves within an hour. BP at home routinely 140s. In the AM taking Valsartan , Triamterne-hydrochlorothiazide  half tablet, carvedilol  9.375 (three 3.125mg  tablets) and in the PM taking Diltiazem , Carvedilol . Notes her PCP who is a functional medicine provider noted elevated CRP 08/14/2024 CRP 10.87) and is planning to undergo further workup.  Additional labs 08/14/2024 LP(a) 35, total cholesterol 214 chondrocytes 117, LDL 134, HDL 59.  Previous antihypertensives: Lisinopril - Severe Cough Irbesartan  - Ineffective Spironolactone  - cramps   Past Medical History:  Diagnosis Date   Anemia     gestational   Arrhythmia    Arterial fibromuscular dysplasia 11/29/2021   Atherosclerosis 06/26/2022   Atypical mole 07/24/2012   right abdomen moderate   Atypical mole 07/10/2018   right supra pubic moderate   Chicken pox as a child   Chronic kidney disease    Left Renal Artery Aneurysm   Depression 06/15/2015   DM, gestational, diet controlled 04/10/2014   insulin  resistant- A1C5.7    Dysrhythmia    A. Fib   GERD (gastroesophageal reflux disease)    undx'd GERD    Heart murmur    Hyperlipidemia 12/20/2015   Hypertension    Neuromuscular disorder (HCC)    Fibromuscular dysplasia in Kidney   Obesity, unspecified 04/10/2014   OSA (obstructive sleep apnea)    uses cpap   Osteoarthritis    Paroxysmal atrial fibrillation (HCC)    Peripheral vascular disease    Renal Artery Stenosis   Pre-diabetes    Prediabetes    Sleep apnea    SVT (supraventricular tachycardia)    s/p RFCA 09/26/12   SVT (supraventricular tachycardia)    a. s/p slow pathway modification 2013 Dr Waddell   Vitamin D  deficiency 12/26/2015   Past Surgical History:  Procedure Laterality Date   ADENOIDECTOMY     BREAST BIOPSY Right 04/22/2024   US  RT BREAST BX W LOC DEV 1ST LESION IMG BX SPEC US  GUIDE 04/22/2024 [facility]   BREAST BIOPSY Right 04/22/2024   US  RT BREAST BX W LOC DEV EA ADD LESION IMG BX SPEC US  GUIDE 04/22/2024 [facility]   BREAST LUMPECTOMY WITH RADIOACTIVE SEED LOCALIZATION Right 08/03/2020   Procedure: RIGHT BREAST EXCISIONAL BIOPSY WITH RADIOACTIVE SEED LOCALIZATION;  Surgeon: Ebbie Cough, MD;  Location: [facility];  Service: General;  Laterality: Right;   ENDOVENOUS ABLATION SAPHENOUS VEIN W/ LASER Bilateral    Thermal ablation   INSERTION OF MESH  01/16/2024   Procedure: INSERTION OF MESH;  Surgeon: Ebbie Cough, MD;  Location: [facility] OR;  Service: General;;   KNEE ARTHROSCOPY WITH MENISCAL REPAIR Left 08/25/2016   Procedure: KNEE ARTHROSCOPY WITH  partial medial meniscectomy, chondroplasty;  Surgeon: Maude Herald, MD;  Location: [facility];  Service: Orthopedics;  Laterality: Left;  KNEE ARTHROSCOPY WITH partial medial meniscectomy, chondroplasty   RENAL ANGIOGRAPHY N/A 01/11/2023   Procedure: RENAL ANGIOGRAPHY;  Surgeon: Court Dorn PARAS, MD;  Location: [facility];  Service: Cardiovascular;  Laterality: N/A;   RENAL INTERVENTION  01/11/2023   Procedure: RENAL INTERVENTION;  Surgeon: Court Dorn PARAS, MD;  Location: [facility];  Service: Cardiovascular;;   TONSILLECTOMY  58 yrs old   UMBILICAL HERNIA REPAIR N/A 01/16/2024   Procedure: OPEN UMBILICAL HERNIA REPAIR WITH MESH;  Surgeon: Ebbie Cough, MD;  Location[facility] OR;  Service: General;  Laterality: N/A;   V-TACH ABLATION N/A 09/26/2012   Procedure: V-TACH ABLATION;  Surgeon: Danelle LELON Waddell, MD;  Location: [facility] CATH LAB;  Service: Cardiovascular;  Laterality: N/A;   WISDOM TOOTH EXTRACTION  58 yrs old     Current Meds  Medication Sig   carvedilol  (COREG ) 6.25 MG tablet Take 1 tablet (6.25 mg total) by mouth 2 (two) times daily.   Cholecalciferol (VITAMIN D ) 2000 units CAPS Take 2,000 Units by mouth daily.   cyanocobalamin 1000 MCG tablet Take 1,000 mcg by mouth daily.  diltiazem  (CARDIZEM  CD) 240 MG 24 hr capsule Take 1 capsule (240 mg total) by mouth daily.   escitalopram  (LEXAPRO ) 10 MG tablet Take 1 tablet by mouth daily.   flecainide  (TAMBOCOR ) 150 MG tablet Take 2 tablets (300 mg total) by mouth as needed.   hydrALAZINE  (APRESOLINE ) 10 MG tablet Take 1 tablet (10 mg total) by mouth as needed (as needed for systolic blood presure more than 180).   Magnesium 250 MG TABS Take 250 mg by mouth daily.   Multiple Vitamin (MULTIVITAMIN) tablet Take 1 tablet by mouth daily.   NON FORMULARY Pt uses a cpap nightly   pyridoxine (B-6) 100 MG tablet Take 100 mg by mouth daily.   triamterene -hydrochlorothiazide  (MAXZIDE-25) 37.5-25 MG tablet Take 0.5-1  tablets by mouth daily as needed (fluid).   valsartan  (DIOVAN ) 320 MG tablet Take 1 tablet (320 mg total) by mouth daily.     Allergies:   Sunflower oil, Ultram [tramadol], Epinephrine , and Lisinopril   Social History   Tobacco Use   Smoking status: Never    Passive exposure: Never   Smokeless tobacco: Never  Vaping Use   Vaping status: Never Used  Substance Use Topics   Alcohol use: Yes    Comment: Rarely   Drug use: No     Family Hx: The patient's family history includes ADD / ADHD in her daughter and son; Alzheimer's disease in her father; Atrial fibrillation in her father and mother; Breast cancer (age of onset: 20) in her sister; Cancer in her maternal grandfather and paternal grandmother; Cancer (age of onset: 66) in her sister; Cataracts in her father; Colon cancer in her maternal grandmother; Colon polyps in her father; Dementia in her father and mother; Heart attack in her maternal grandfather; Hypertension in her brother, father, and mother; Stroke in her mother. There is no history of Esophageal cancer, Rectal cancer, or Stomach cancer.  ROS:   Please see the history of present illness.     All other systems reviewed and are negative.  Prior CV studies:   The following studies were reviewed today:  Cardiac Studies & Procedures   ______________________________________________________________________________________________     ECHOCARDIOGRAM  ECHOCARDIOGRAM COMPLETE 10/24/2021  Narrative ECHOCARDIOGRAM REPORT    Patient Name:   MICHEALA MORISSETTE Date of Exam: 10/24/2021 Medical Rec #:  [id number]              Height:       68.5 in Accession #:    [id number]             Weight:       252.8 lb Date of Birth:  February 22, 1966              BSA:          2.270 m Patient Age:    58 years               BP:           113/74 mmHg Patient Gender: F                      HR:           61 bpm. Exam Location:  [address]  Procedure: 2D Echo, Cardiac Doppler, Color  Doppler and Intracardiac Opacification Agent  Indications:    R06.00 Dyspnea  History:        Patient has prior history of Echocardiogram examinations, most recent 07/10/2017. Risk Factors:Dyslipidemia, Hypertension and Pre-Diabetes. Obstructive sleep apnea.  Paroxysmal atrial fibrillation.  Sonographer:    Carl Rodgers-Jones RDCS Referring Phys: [id number] JONATHAN J BERRY  IMPRESSIONS   1. Left ventricular ejection fraction, by estimation, is 60 to 65%. The left ventricle has normal function. The left ventricle has no regional wall motion abnormalities. Left ventricular diastolic parameters were normal. 2. Right ventricular systolic function is normal. The right ventricular size is normal. 3. Left atrial size was severely dilated. 4. The mitral valve is abnormal. Trivial mitral valve regurgitation. No evidence of mitral stenosis. 5. The aortic valve is tricuspid. Aortic valve regurgitation is not visualized. No aortic stenosis is present. 6. The inferior vena cava is normal in size with greater than 50% respiratory variability, suggesting right atrial pressure of 3 mmHg.  FINDINGS Left Ventricle: Left ventricular ejection fraction, by estimation, is 60 to 65%. The left ventricle has normal function. The left ventricle has no regional wall motion abnormalities. Definity  contrast agent was given IV to delineate the left ventricular endocardial borders. The left ventricular internal cavity size was normal in size. There is no left ventricular hypertrophy. Left ventricular diastolic parameters were normal.  Right Ventricle: The right ventricular size is normal. No increase in right ventricular wall thickness. Right ventricular systolic function is normal.  Left Atrium: Left atrial size was severely dilated.  Right Atrium: Right atrial size was normal in size.  Pericardium: There is no evidence of pericardial effusion.  Mitral Valve: The mitral valve is abnormal. There is mild thickening  of the mitral valve leaflet(s). There is mild calcification of the mitral valve leaflet(s). Trivial mitral valve regurgitation. No evidence of mitral valve stenosis.  Tricuspid Valve: The tricuspid valve is normal in structure. Tricuspid valve regurgitation is trivial. No evidence of tricuspid stenosis.  Aortic Valve: The aortic valve is tricuspid. Aortic valve regurgitation is not visualized. No aortic stenosis is present.  Pulmonic Valve: The pulmonic valve was normal in structure. Pulmonic valve regurgitation is not visualized. No evidence of pulmonic stenosis.  Aorta: The aortic root is normal in size and structure.  Venous: The inferior vena cava is normal in size with greater than 50% respiratory variability, suggesting right atrial pressure of 3 mmHg.  IAS/Shunts: No atrial level shunt detected by color flow Doppler.   LEFT VENTRICLE PLAX 2D LVIDd:         5.60 cm   Diastology LVIDs:         3.60 cm   LV e' medial:    7.40 cm/s LV PW:         0.80 cm   LV E/e' medial:  9.2 LV IVS:        0.80 cm   LV e' lateral:   9.79 cm/s LVOT diam:     2.00 cm   LV E/e' lateral: 7.0 LV SV:         68 LV SV Index:   30 LVOT Area:     3.14 cm   RIGHT VENTRICLE             IVC RV Basal diam:  3.40 cm     IVC diam: 1.70 cm RV S prime:     20.70 cm/s TAPSE (M-mode): 2.6 cm  LEFT ATRIUM             Index        RIGHT ATRIUM           Index LA diam:        5.30 cm 2.33 cm/m   RA Area:  10.30 cm LA Vol (A2C):   71.0 ml 31.28 ml/m  RA Volume:   20.90 ml  9.21 ml/m LA Vol (A4C):   87.1 ml 38.37 ml/m LA Biplane Vol: 80.5 ml 35.46 ml/m AORTIC VALVE LVOT Vmax:   96.15 cm/s LVOT Vmean:  64.500 cm/s LVOT VTI:    0.217 m  AORTA Ao Root diam: 3.10 cm Ao Asc diam:  3.20 cm  MITRAL VALVE               TRICUSPID VALVE MV Area (PHT): 2.87 cm    TR Peak grad:   15.2 mmHg MV Decel Time: 264 msec    TR Vmax:        195.00 cm/s MV E velocity: 68.10 cm/s MV A velocity: 76.80 cm/s   SHUNTS MV E/A ratio:  0.89        Systemic VTI:  0.22 m Systemic Diam: 2.00 cm  Maude Emmer MD Electronically signed by Maude Emmer MD Signature Date/Time: 10/24/2021/10:43:01 AM    Final    MONITORS  LONG TERM MONITOR (3-14 DAYS) 07/29/2024  Narrative Patch Wear Time:  13 days and 20 hours (2025-08-20T18:14:13-0400 to 2025-09-03T14:56:50-0400)  HR 42 - 221, average 67 bpm. 80 nonsustained SVT (longest 14.5 seconds). Atrial fibrillation detected. <1% burden. Longest episode 1 hour and 58 minutes. Rare supraventricular ectopy. Rare ventricular ectopy. Symptom trigger episodes correspond to atrial fibrillation and sinus with ectopy.  Fonda Kitty Cardiac Electrophysiology   CT SCANS  CT CARDIAC SCORING (SELF PAY ONLY) 11/06/2022  Addendum 11/08/2022  3:17 PM ADDENDUM REPORT: 11/08/2022 15:15  CLINICAL DATA:  Cardiovascular Disease Risk stratification  EXAM: Coronary Calcium Score  TECHNIQUE: A gated, non-contrast computed tomography scan of the heart was performed using 3mm slice thickness. Axial images were analyzed on a dedicated workstation. Calcium scoring of the coronary arteries was performed using the Agatston method.  FINDINGS: Coronary arteries: Normal origins.  Coronary Calcium Score:  Left main: 0  Left anterior descending artery: 0  Left circumflex artery: 0  Right coronary artery: 0  Total: 0  Percentile: 0  Pericardium: Normal.  Aorta: Normal caliber of ascending aorta. No aortic atherosclerosis noted.  Hiatal hernia noted.  Non-cardiac: See separate report from [facility].  IMPRESSION: Coronary calcium score of 0. This was 0 percentile for age-, race-, and sex-matched controls. Hiatal hernia.  RECOMMENDATIONS: Coronary artery calcium (CAC) score is a strong predictor of incident coronary heart disease (CHD) and provides predictive information beyond traditional risk factors. CAC scoring is reasonable to use in  the decision to withhold, postpone, or initiate statin therapy in intermediate-risk or selected borderline-risk asymptomatic adults (age 69-75 years and LDL-C >=70 to <190 mg/dL) who do not have diabetes or established atherosclerotic cardiovascular disease (ASCVD).* In intermediate-risk (10-year ASCVD risk >=7.5% to <20%) adults or selected borderline-risk (10-year ASCVD risk >=5% to <7.5%) adults in whom a CAC score is measured for the purpose of making a treatment decision the following recommendations have been made:  If CAC=0, it is reasonable to withhold statin therapy and reassess in 5 to 10 years, as long as higher risk conditions are absent (diabetes mellitus, family history of premature CHD in first degree relatives (males <55 years; females <65 years), cigarette smoking, or LDL >=190 mg/dL).  If CAC is 1 to 99, it is reasonable to initiate statin therapy for patients >=64 years of age.  If CAC is >=100 or >=75th percentile, it is reasonable to initiate statin therapy at any age.  Cardiology referral should  be considered for patients with CAC scores >=400 or >=75th percentile.  *2018 AHA/ACC/AACVPR/AAPA/ABC/ACPM/ADA/AGS/APhA/ASPC/NLA/PCNA Guideline on the Management of Blood Cholesterol: A Report of the American College of Cardiology/American Heart Association Task Force on Clinical Practice Guidelines. J Am Coll Cardiol. 2019;73(24):3168-3209.  Shelda Bruckner, MD   Electronically Signed By: Shelda Bruckner M.D. On: 11/08/2022 15:15  Narrative : OVER-READ INTERPRETATION  CT CHEST  The following report is an over-read performed by radiologist Dr. Isla Qua [facility] on 11/07/2022. This over-read does not include interpretation of cardiac or coronary anatomy or pathology. The coronary calcium interpretation by the cardiologist is attached. Imaging of the chest is focused on cardiac structures and excludes much of the chest on  CT.  COMPARISON:  December 10, 2017  FINDINGS:  Cardiovascular: Please see dedicated report for cardiovascular details.  Mediastinum/Nodes: No adenopathy or acute process in the mediastinum.  Lungs/Pleura: No signs of consolidation or pleural effusion. Tiny RIGHT lower lobe pulmonary nodule stable since 2019 compatible with benign pulmonary nodule for which no additional dedicated imaging follow-up is recommended. Airways are patent to the extent evaluated.  Upper Abdomen: Incidental imaging of upper abdominal contents without acute process. Moderate size hiatal hernia similar to previous imaging.  Musculoskeletal: No acute bone finding. No destructive bone process. Spinal degenerative changes.  IMPRESSION:  1. No acute extracardiac findings. 2. Moderate size hiatal hernia similar to previous imaging.  Electronically Signed: By: Isla Blind M.D. On: 11/07/2022 11:39   CT SCANS  CT CARDIAC SCORING (SELF PAY ONLY) 07/10/2017  Addendum 07/10/2017  9:45 AM ADDENDUM REPORT: 07/10/2017 09:42  CLINICAL DATA:  Risk stratification  EXAM: Coronary Calcium Score  TECHNIQUE: The patient was scanned on a Siemens Somatom 64 slice scanner. Axial non-contrast 3 mm slices were carried out through the heart. The data set was analyzed on a dedicated work station and scored using the Agatson method.  FINDINGS: Non-cardiac: See separate report from [facility].  Ascending Aorta:  3.3 cm  Pericardium: Normal  Coronary arteries:  No calcium detected  IMPRESSION: Coronary calcium score of  0.  Maude Emmer   Electronically Signed By: Maude Emmer M.D. On: 07/10/2017 09:42  Narrative EXAM: OVER-READ INTERPRETATION  CT CHEST  The following report is an over-read performed by radiologist Dr. Franky Leff [facility] on 07/10/2017. This over-read does not include interpretation of cardiac or coronary anatomy or pathology. The coronary  calcium score interpretation by the cardiologist is attached.  COMPARISON:  None.  FINDINGS: Cardiovascular: Heart is normal size. Visualized aorta normal caliber.  Mediastinum/Nodes: No adenopathy in the lower mediastinum or hila. Small hiatal hernia.  Lungs/Pleura: Nodular density noted in the medial right lower lobe, approximately 1.9 cm. This has an elongated appearance on coronal and sagittal reconstructed images and most likely reflects scarring, but warrants follow-up. Remainder the lungs are clear. No effusions.  Upper Abdomen: Imaging into the upper abdomen shows no acute findings.  Musculoskeletal: Chest wall soft tissues unremarkable. No acute bony abnormality.  IMPRESSION: Nodular area in the medial right lower lobe, most likely scarring, but recommend follow-up CT in 6-12 months.  Electronically Signed: By: Franky Crease M.D. On: 07/10/2017 09:33     ______________________________________________________________________________________________       Labs/Other Tests and Data Reviewed:    EKG:  No ECG reviewed.  Recent Labs: 02/05/2024: ALT 11; BUN 16; Creatinine, Ser 0.69; Sodium 141 08/14/2024: Hemoglobin 11.1; Magnesium 2.3; Platelets 258; Potassium 4.4; TSH 1.090   Recent Lipid Panel Lab Results  Component Value Date/Time   CHOL 175 07/17/2019 09:00 AM   CHOL 177 08/22/2018 10:15 AM   TRIG 93.0 07/17/2019 09:00 AM   HDL 57.90 07/17/2019 09:00 AM   HDL 55 08/22/2018 10:15 AM   CHOLHDL 3 07/17/2019 09:00 AM   LDLCALC 99 07/17/2019 09:00 AM   LDLCALC 100 (H) 08/22/2018 10:15 AM    Wt Readings from Last 3 Encounters:  02/11/24 250 lb (113.4 kg)  02/11/24 251 lb (113.9 kg)  01/23/24 251 lb (113.9 kg)     Risk Assessment/Calculations:    CHA2DS2-VASc Score = 2   This indicates a 2.2% annual risk of stroke. The patient's score is based upon: CHF History: 0 HTN History: 1 Diabetes History: 0 Stroke History: 0 Vascular Disease History:  0 Age Score: 0 Gender Score: 1        Objective:    Vital Signs:  LMP 06/24/2018    VITAL SIGNS:  reviewed  ASSESSMENT & PLAN:    Arterial fibromuscular dysplasia / HTN -SBP at home routinely 140s. Renal artery fibromuscular dysplasia followed by Dr. Court, renal duplex 08/06/24 right renal artery 1 to 59% stenosis, 70-99% stenosis of SMA.  No abdominal pain after eating, no further testing indicated.  Increase carvedilol  to 12.5 mg twice daily Continue  triamterene -hctz half tablet daily, Diltiazem  240mg  QD, Valsartan  320mg  QD.  Continue Hydralazine  10mg  PRN for SBP >180.  Discussed to monitor BP at home at least 2 hours after medications and sitting for 5-10 minutes.  Will inquire with Dr. Court whether to continue ASA long term, she is not taking at thist ime.    Atrial fibrillation - s/p RCFA.  Recent monitor with episodes of SVT with PAF burden less than 1%.  Managed by PRN Flecainide . Not on OAC given CHA2DS2-VASc of 2 with 1 point being for female gender and low burden.  Has upcoming visit with Dr. Kennyth to reestablish with EP.   Atherosclerosis / HLD - Known mild atherosclerosis of cerebral vascular. Ideally LDL goal <70.  Again requested work on lifestyle changes.  Handout from National lipid Association provided.  Will plan for repeat labs before her March visit.  If LDL not nearing goal of <70, plan to add Rosuvastatin 5mg  three times per week.    SVT - Stable on diltiazem  240mg  daily. Adjust Carvedilol  to 12.5mg  BID for BP control as above.        Time:   Today, I have spent 15 minutes with the patient with telehealth technology discussing the above problems.     Medication Adjustments/Labs and Tests Ordered: Current medicines are reviewed at length with the patient today.  Concerns regarding medicines are outlined above.   Tests Ordered: No orders of the defined types were placed in this encounter.   Medication Changes: No orders of the defined types were placed  in this encounter.   Follow Up:  In Person 01/2025  Signed, Reche GORMAN Finder, NP  08/29/2024 8:28 AM    [facility]

## 2024-09-01 ENCOUNTER — Encounter (HOSPITAL_BASED_OUTPATIENT_CLINIC_OR_DEPARTMENT_OTHER): Payer: Self-pay

## 2024-09-10 NOTE — Progress Notes (Signed)
 " Electrophysiology Office Note:   Date:  09/12/2024  ID:  Misty Mahoney, DOB [birth year], MRN [id number]  Primary Cardiologist: None Electrophysiologist: Fonda Kitty, MD      History of Present Illness:   Misty Mahoney is a 58 y.o. female with h/o hypertension in setting of fibromuscular dysplasia, GERD, diabetes, HLD, obesity, SVT s/p AVNRT ablation in 2013 who is being seen today for follow up.  Discussed the use of AI scribe software for clinical note transcription with the patient, who gave verbal consent to proceed.  History of Present Illness Misty Mahoney is a 58 year old female with atrial fibrillation who presents for follow-up of her cardiac arrhythmias.  She has a history of AVNRT ablation and has experienced atrial fibrillation intermittently over the years. Episodes increased in frequency about six months ago, occurring daily, which she associated with drinking diet red punch. Upon discontinuing the drink, her episodes ceased. She uses flecainide  as needed to convert episodes and has been successful with this treatment. No current palpitations or frequent atrial fibrillation episodes. Her episodes are often triggered by alcohol, stress, and lack of sleep, and she recognizes them due to symptoms such as increased urination.  She recalls an episode of supraventricular tachycardia in the past where she was converted with IV Cardizem  instead of adenosine . Post-ablation for SVT, she experienced residual atrial fibrillation.  She is concerned about her blood pressure, which is difficult to manage despite being on three medications. Her blood pressure can reach the 130s on a good day. She mentions a previous echocardiogram that showed a severely dilated left atrium, which she is worried about in relation to her atrial fibrillation and potential scarring.  She is compliant with her sleep apnea treatment and sleeps eight hours a night. She has attempted weight loss with a GLP-1  agonist but experienced adverse effects. She is considering trying another medication for weight loss.  She has had two normal CT cardiac scans but is aware of plaque in other areas of her body. She is hesitant to start statin therapy due to concerns about potential side effects, especially given her family history of dementia.   Review of systems complete and found to be negative unless listed in HPI.   EP Information / Studies Reviewed:    EKG is ordered today. Personal review as below.  EKG Interpretation Date/Time:  Thursday September 11 2024 09:12:31 EDT Ventricular Rate:  56 PR Interval:  148 QRS Duration:  94 QT Interval:  416 QTC Calculation: 401 R Axis:   54  Text Interpretation: Sinus bradycardia When compared with ECG of 24-Dec-2023 09:26, Criteria for Septal infarct are no longer Present Confirmed by Kitty Fonda [phone]) on 09/11/2024 9:27:06 AM   ECG 09/07/08: SVT   ECG 02/07/16: SVT   ECG 02/07/16: AF   Zio 06/2024    Echo 10/2021:   1. Left ventricular ejection fraction, by estimation, is 60 to 65%. The  left ventricle has normal function. The left ventricle has no regional  wall motion abnormalities. Left ventricular diastolic parameters were  normal.   2. Right ventricular systolic function is normal. The right ventricular  size is normal.   3. Left atrial size was severely dilated.   4. The mitral valve is abnormal. Trivial mitral valve regurgitation. No  evidence of mitral stenosis.   5. The aortic valve is tricuspid. Aortic valve regurgitation is not  visualized. No aortic stenosis is present.   6. The inferior vena cava is normal in  size with greater than 50%  respiratory variability, suggesting right atrial pressure of 3 mmHg.        Physical Exam:   VS:  BP (!) 145/71   Pulse (!) 56   Ht 5' 8.5 (1.74 m)   Wt 258 lb (117 kg)   LMP 06/24/2018   SpO2 95%   BMI 38.66 kg/m    Wt Readings from Last 3 Encounters:  09/11/24 258 lb (117 kg)   02/11/24 250 lb (113.4 kg)  02/11/24 251 lb (113.9 kg)     GEN: Well nourished, well developed in no acute distress NECK: No JVD CARDIAC: Bradycardic, regular rhythm RESPIRATORY:  Clear to auscultation without rales, wheezing or rhonchi  ABDOMEN: Soft, non-distended EXTREMITIES:  No edema; No deformity   ASSESSMENT AND PLAN:    #Paroxysmal atrial fibrillation: Episodes are symptomatic but continue to be relatively infrequent overall.  We discussed that if frequency increases, then options would be taking flecainide  as a scheduled medication or pursuing catheter ablation.  She reviewed appropriate ablation should she choose to do so. #High risk medication use: Flecainide .  Normal PR and QRS intervals on ECG today. #Hypercoagulable state due to AF:  -Continue carvedilol  12.5mg  BID.  -Continue diltiazem  240mg  daily. -Flecainide  as pill in the pocket.   #SVT s/p AVNRT ablation in 2013: No documented recurrence. - AV nodal blocking medications and flecainide  as above.  #Hypertension: Difficult to control, likely secondary to fibromuscular dysplasia involving renal artery. -Above goal today.  Recommend checking blood pressures 1-2 times per week at home and recording the values.  Recommend bringing these recordings to the primary care physician.  # Obstructive sleep apnea: She is compliant with CPAP. - Continue CPAP.  Follow up with EP APP in 6 months  Signed, Fonda Kitty, MD  "

## 2024-09-11 ENCOUNTER — Ambulatory Visit: Attending: Cardiology | Admitting: Cardiology

## 2024-09-11 ENCOUNTER — Encounter: Payer: Self-pay | Admitting: Cardiology

## 2024-09-11 VITALS — BP 145/71 | HR 56 | Ht 68.5 in | Wt 258.0 lb

## 2024-09-11 DIAGNOSIS — G4733 Obstructive sleep apnea (adult) (pediatric): Secondary | ICD-10-CM | POA: Diagnosis not present

## 2024-09-11 DIAGNOSIS — I471 Supraventricular tachycardia, unspecified: Secondary | ICD-10-CM | POA: Diagnosis not present

## 2024-09-11 DIAGNOSIS — I480 Paroxysmal atrial fibrillation: Secondary | ICD-10-CM | POA: Diagnosis not present

## 2024-09-11 DIAGNOSIS — D6869 Other thrombophilia: Secondary | ICD-10-CM

## 2024-09-11 DIAGNOSIS — I4719 Other supraventricular tachycardia: Secondary | ICD-10-CM | POA: Diagnosis not present

## 2024-09-11 DIAGNOSIS — Z79899 Other long term (current) drug therapy: Secondary | ICD-10-CM

## 2024-09-11 DIAGNOSIS — I10 Essential (primary) hypertension: Secondary | ICD-10-CM

## 2024-09-11 NOTE — Patient Instructions (Signed)
 Medication Instructions:  Your physician recommends that you continue on your current medications as directed. Please refer to the Current Medication list given to you today.  *If you need a refill on your cardiac medications before your next appointment, please call your pharmacy*  Testing/Procedures: Echocardiogram  Your physician has requested that you have an echocardiogram. Echocardiography is a painless test that uses sound waves to create images of your heart. It provides your doctor with information about the size and shape of your heart and how well your heart's chambers and valves are working. This procedure takes approximately one hour. There are no restrictions for this procedure. Please do NOT wear cologne, perfume, aftershave, or lotions (deodorant is allowed). Please arrive 15 minutes prior to your appointment time.  Please note: We ask at that you not bring children with you during ultrasound (echo/ vascular) testing. Due to room size and safety concerns, children are not allowed in the ultrasound rooms during exams. Our front office staff cannot provide observation of children in our lobby area while testing is being conducted. An adult accompanying a patient to their appointment will only be allowed in the ultrasound room at the discretion of the ultrasound technician under special circumstances. We apologize for any inconvenience.  Follow-Up: At [facility], you and your health needs are our priority.  As part of our continuing mission to provide you with exceptional heart care, our providers are all part of one team.  This team includes your primary Cardiologist (physician) and Advanced Practice Providers or APPs (Physician Assistants and Nurse Practitioners) who all work together to provide you with the care you need, when you need it.  Your next appointment:   1 year  Provider:   You will see one of the following Advanced Practice Providers on your designated Care  Team:   Charlies Arthur, [place] Jodie Passey, PA-C Suzann Riddle, NP Daphne Barrack, NP Artist Pouch, PA-C

## 2024-09-22 ENCOUNTER — Encounter: Payer: Self-pay | Admitting: Radiology

## 2024-10-31 ENCOUNTER — Ambulatory Visit (HOSPITAL_COMMUNITY)

## 2024-11-03 ENCOUNTER — Ambulatory Visit (HOSPITAL_COMMUNITY)
Admission: RE | Admit: 2024-11-03 | Discharge: 2024-11-03 | Disposition: A | Discharge status: Home / Self Care | Source: Ambulatory Visit | Attending: Cardiology | Admitting: Cardiology

## 2024-11-03 DIAGNOSIS — I4719 Other supraventricular tachycardia: Secondary | ICD-10-CM

## 2024-11-03 LAB — ECHOCARDIOGRAM COMPLETE
Area-P 1/2: 2.5 cm2
S' Lateral: 3.6 cm

## 2024-11-07 ENCOUNTER — Ambulatory Visit: Payer: Self-pay | Admitting: Cardiology

## 2024-12-22 ENCOUNTER — Ambulatory Visit: Admitting: Orthopedic Surgery

## 2024-12-22 ENCOUNTER — Ambulatory Visit (HOSPITAL_COMMUNITY)

## 2024-12-23 ENCOUNTER — Other Ambulatory Visit (HOSPITAL_BASED_OUTPATIENT_CLINIC_OR_DEPARTMENT_OTHER): Payer: Self-pay | Admitting: Family

## 2024-12-23 ENCOUNTER — Encounter (HOSPITAL_BASED_OUTPATIENT_CLINIC_OR_DEPARTMENT_OTHER): Payer: Self-pay

## 2024-12-23 DIAGNOSIS — I10 Essential (primary) hypertension: Secondary | ICD-10-CM

## 2025-01-16 ENCOUNTER — Ambulatory Visit: Admitting: Orthopedic Surgery

## 2025-01-19 ENCOUNTER — Ambulatory Visit: Admitting: Orthopedic Surgery

## 2025-01-30 ENCOUNTER — Ambulatory Visit (HOSPITAL_BASED_OUTPATIENT_CLINIC_OR_DEPARTMENT_OTHER): Admitting: Family
